# Patient Record
Sex: Male | Born: 1950 | Race: Black or African American | Hispanic: No | Marital: Single | Smoking: Never smoker
Health system: Southern US, Community
[De-identification: ages and names within clinical notes are randomized; demographics above are authoritative.]

## PROBLEM LIST (undated history)

## (undated) DIAGNOSIS — N289 Disorder of kidney and ureter, unspecified: Secondary | ICD-10-CM

## (undated) DIAGNOSIS — I471 Supraventricular tachycardia, unspecified: Secondary | ICD-10-CM

## (undated) DIAGNOSIS — I10 Essential (primary) hypertension: Secondary | ICD-10-CM

## (undated) DIAGNOSIS — I5020 Unspecified systolic (congestive) heart failure: Secondary | ICD-10-CM

## (undated) DIAGNOSIS — I119 Hypertensive heart disease without heart failure: Secondary | ICD-10-CM

## (undated) DIAGNOSIS — I43 Cardiomyopathy in diseases classified elsewhere: Secondary | ICD-10-CM

## (undated) DIAGNOSIS — N182 Chronic kidney disease, stage 2 (mild): Secondary | ICD-10-CM

## (undated) DIAGNOSIS — I428 Other cardiomyopathies: Secondary | ICD-10-CM

## (undated) DIAGNOSIS — I509 Heart failure, unspecified: Secondary | ICD-10-CM

## (undated) DIAGNOSIS — I499 Cardiac arrhythmia, unspecified: Secondary | ICD-10-CM

## (undated) HISTORY — DX: Other cardiomyopathies: I42.8

## (undated) HISTORY — DX: Cardiomyopathy in diseases classified elsewhere: I43

## (undated) HISTORY — DX: Essential (primary) hypertension: I10

## (undated) HISTORY — DX: Disorder of kidney and ureter, unspecified: N28.9

## (undated) HISTORY — DX: Chronic kidney disease, stage 2 (mild): N18.2

## (undated) HISTORY — DX: Supraventricular tachycardia: I47.1

## (undated) HISTORY — DX: Supraventricular tachycardia, unspecified: I47.10

## (undated) HISTORY — DX: Unspecified systolic (congestive) heart failure: I50.20

## (undated) HISTORY — DX: Heart failure, unspecified: I50.9

## (undated) HISTORY — DX: Cardiac arrhythmia, unspecified: I49.9

## (undated) HISTORY — DX: Hypertensive heart disease without heart failure: I11.9

---

## 2006-12-02 ENCOUNTER — Other Ambulatory Visit: Payer: Self-pay

## 2006-12-02 ENCOUNTER — Emergency Department: Payer: Self-pay | Admitting: Emergency Medicine

## 2009-07-11 ENCOUNTER — Ambulatory Visit: Payer: Self-pay | Admitting: Cardiovascular Disease

## 2009-07-11 ENCOUNTER — Inpatient Hospital Stay: Payer: Self-pay | Admitting: Internal Medicine

## 2009-07-13 ENCOUNTER — Encounter: Payer: Self-pay | Admitting: Cardiovascular Disease

## 2009-07-14 ENCOUNTER — Encounter: Payer: Self-pay | Admitting: Cardiovascular Disease

## 2009-07-14 ENCOUNTER — Telehealth: Payer: Self-pay | Admitting: Cardiovascular Disease

## 2009-07-15 ENCOUNTER — Encounter: Payer: Self-pay | Admitting: Cardiovascular Disease

## 2009-07-19 ENCOUNTER — Encounter: Payer: Self-pay | Admitting: Cardiovascular Disease

## 2009-07-19 ENCOUNTER — Telehealth: Payer: Self-pay | Admitting: Cardiovascular Disease

## 2009-07-22 ENCOUNTER — Ambulatory Visit: Payer: Self-pay | Admitting: Cardiovascular Disease

## 2009-07-22 DIAGNOSIS — I471 Supraventricular tachycardia: Secondary | ICD-10-CM | POA: Insufficient documentation

## 2009-07-22 DIAGNOSIS — I420 Dilated cardiomyopathy: Secondary | ICD-10-CM

## 2009-07-22 DIAGNOSIS — I21A1 Myocardial infarction type 2: Secondary | ICD-10-CM | POA: Insufficient documentation

## 2009-07-22 DIAGNOSIS — R0602 Shortness of breath: Secondary | ICD-10-CM

## 2009-07-25 ENCOUNTER — Encounter: Payer: Self-pay | Admitting: Cardiovascular Disease

## 2009-07-26 ENCOUNTER — Encounter: Payer: Self-pay | Admitting: Cardiovascular Disease

## 2009-08-10 ENCOUNTER — Telehealth: Payer: Self-pay | Admitting: Cardiovascular Disease

## 2009-08-24 ENCOUNTER — Ambulatory Visit: Payer: Self-pay | Admitting: Family

## 2009-10-03 ENCOUNTER — Encounter: Payer: Self-pay | Admitting: Cardiovascular Disease

## 2009-10-06 ENCOUNTER — Ambulatory Visit: Payer: Self-pay | Admitting: Family

## 2010-01-27 ENCOUNTER — Ambulatory Visit: Payer: Self-pay | Admitting: Family

## 2010-04-07 ENCOUNTER — Ambulatory Visit: Payer: Self-pay | Admitting: Family

## 2010-05-19 ENCOUNTER — Ambulatory Visit: Payer: Self-pay | Admitting: Family

## 2010-05-30 NOTE — Letter (Signed)
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 [address]   [place], [place] [address]   Phone: [phone]  Fax: [phone]    07/19/2009  TO: Leodis Sias IT MAY CONCERN   RE: Steve Salazar [address] [place] [address]   The above named individual is under my medical care and may return to work on: 07/22/09  If you have any further questions or need additional information, please call.     Sincerely,     Dossie Arbour, MD

## 2010-05-30 NOTE — Miscellaneous (Signed)
Summary: increased dil to 180 mg  Clinical Lists Changes  Medications: Changed medication from CARDIZEM 120 MG TABS (DILTIAZEM HCL) 1 by mouth once daily to DILTIAZEM HCL ER BEADS 180 MG XR24H-CAP (DILTIAZEM HCL ER BEADS) 1 tab by mouth daily - Signed Rx of DILTIAZEM HCL ER BEADS 180 MG XR24H-CAP (DILTIAZEM HCL ER BEADS) 1 tab by mouth daily;  #30 x 1;  Signed;  Entered by: Charlena Cross, RN, BSN;  Authorized by: Dossie Arbour MD;  Method used: Electronically to [facility] [address] [phone].*, [address] [place], [place], [place]  [address], Ph: [phone], Fax: [phone]    Prescriptions: DILTIAZEM HCL ER BEADS 180 MG XR24H-CAP (DILTIAZEM HCL ER BEADS) 1 tab by mouth daily  #30 x 1   Entered by:   Charlena Cross, RN, BSN   Authorized by:   Dossie Arbour MD   Signed by:   Charlena Cross, RN, BSN on 07/26/2009   Method used:   Electronically to        [facility] [address] [phone].* (retail)       [address]       [place], [place]  [address]       Ph: [phone]       Fax: [phone]   RxID:   [id number]

## 2010-05-30 NOTE — Progress Notes (Signed)
Summary: appt rescheduled  Phone Note Outgoing Call   Summary of Call: called pt to see how he is feeling- if he is feeling ok, Dr. Mariah Milling would like to move his ROV to May when he returns.  If pt is SOB, pt to see Dr. Shirlee Latch on Friday.  Pt would like to postpone appt until Dr. Mariah Milling returns.  Pt would also like rx for cialis if possible.  Initial call taken by: Charlena Cross, RN, BSN,  August 10, 2009 10:49 AM     Appended Document: appt rescheduled per Dr. Mariah Milling cialis 20 mg 1/2 tab daily to start. Charlena Cross RN BSN   Clinical Lists Changes  Medications: Added new medication of CIALIS 20 MG TABS (TADALAFIL) 1/2 tab by mouth 30 minutes prior to activity.  May increase to 1 tab by mouth as needed. - Signed Rx of CIALIS 20 MG TABS (TADALAFIL) 1/2 tab by mouth 30 minutes prior to activity.  May increase to 1 tab by mouth as needed.;  #30 x 1;  Signed;  Entered by: Charlena Cross, RN, BSN;  Authorized by: Dossie Arbour MD;  Method used: Electronically to [facility] [address] [phone].*, [address], [place], [place], [place]  [address], Ph: [phone], Fax: [phone]    Prescriptions: CIALIS 20 MG TABS (TADALAFIL) 1/2 tab by mouth 30 minutes prior to activity.  May increase to 1 tab by mouth as needed.  #30 x 1   Entered by:   Charlena Cross, RN, BSN   Authorized by:   Dossie Arbour MD   Signed by:   Charlena Cross, RN, BSN on 08/10/2009   Method used:   Electronically to        [facility] [address] [phone].* (retail)       [address]       [place], [place]  [address]       Ph: [phone]       Fax: [phone]   RxID:   [id number]

## 2010-05-30 NOTE — Procedures (Signed)
Summary: [facility]  [facility]   Imported By: Harlon Flor 07/26/2009 08:42:46  _____________________________________________________________________  External Attachment:    Type:   Image     Comment:   External Document  Appended Document: [facility] pt started on dilt 180. Charlena Cross RN BSN   Appended Document: [facility] Patient has no symptoms when in SVT. Freuqent episodes. Presented with CHF at [facility]. SVT has not responded to diltiazem and he may require low dose amio? We tried to adjust medications while he was still on his event monitor and he gave the phone to his girlfriend because he did not understand the medication change. She was rude to [facility] and told her that "he was on enough medication already and to leave it alone."

## 2010-05-30 NOTE — Progress Notes (Signed)
Summary: PAPERS  Phone Note Call from Patient Call back at Home Phone [phone]   Caller: SELF Call For: Samella Lucchetti Summary of Call: WOULD LIKE A CALL BACK FROM DR Mariah Milling ABOUT PICKING SOME PAPERS UP THAT NEED TO BE SIGNED BY Bonny Egger SO THAT HE CAN RETURN TO WORK Initial call taken by: Harlon Flor,  July 19, 2009 12:29 PM  Follow-up for Phone Call        pt aware that papers are ready. Follow-up by: Charlena Cross, RN, BSN,  July 19, 2009 4:48 PM

## 2010-05-30 NOTE — Procedures (Signed)
Summary: [facility] Event  [facility] Event   Imported By: West Carbo 10/03/2009 11:51:43  _____________________________________________________________________  External Attachment:    Type:   Image     Comment:   External Document

## 2010-05-30 NOTE — Letter (Signed)
[facility]   Imported By: Harlon Flor 07/15/2009 12:21:30  _____________________________________________________________________  External Attachment:    Type:   Image     Comment:   External Document

## 2010-05-30 NOTE — Letter (Signed)
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 [address]   [place], [place] [address]   Phone: [phone]  Fax: [phone]    07/15/2009  TO: Leodis Sias IT MAY CONCERN   RE: Steve Salazar [address] [id number]   The above named individual is under my medical care and may return to work on: 07/18/09  If you have any further questions or need additional information, please call.     Sincerely,      Dossie Arbour MD

## 2010-05-30 NOTE — Progress Notes (Signed)
Summary: PHI  PHI   Imported By: Harlon Flor 07/25/2009 16:22:00  _____________________________________________________________________  External Attachment:    Type:   Image     Comment:   External Document

## 2010-05-30 NOTE — Assessment & Plan Note (Signed)
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  History of Present Illness: [name] is a pleasant 60 year old gentleman with recent admission to [facility] on 3/14 and discharged on 3/17 with shortness of breath, systolic congestive heart failure with ejection fraction 25%, nonischemic cardiomyopathy with negative stress test who presents for followup.  During his hospital course, he had significant diuresis, he was started on beta blockers, diltiazem, digoxin for rhythm control. He states that he feels well, has no significant shortness of breath and is able to do things that he could not do previously. He was unable to appreciate his fast rhythm and since the discharge, again has had no palpitations. An event monitor was ordered and he currently has it and will start wearing it today. This will be used to rule out further arrhythmia.  Stress test showed severe cardiomyopathy with ejection fraction 25%, dilated left ventricle, low risk scan.   Initial BNP on arrival to the hospital was 1342  He denied any history of drug or alcohol use, no cocaine.  Total cholesterol 167, LDL 74, HDL 77.  Echo showing moderately dilated left ventricle, ejection fraction 25 to 30%, diastolic relaxation abnormality, normal RV size and function, mild to moderate mitral regurgitation, mild to moderately dilated left atrium  Preventive Screening-Counseling & Management  Alcohol-Tobacco     Alcohol drinks/day: 0     Smoking Status: never  Caffeine-Diet-Exercise     Caffeine use/day: 1/2 - 1 cup     Does Patient Exercise: yes      Drug Use:  no.    Current Medications (verified): 1)  Digoxin 0.25 Mg Tabs (Digoxin) .... Take One Tablet By Mouth Daily 2)  Furosemide 20 Mg Tabs (Furosemide) .... Take One Tablet By Mouth Daily. 3)  Cardizem 120 Mg Tabs (Diltiazem Hcl) .Marland Kitchen.. 1 By  Mouth Once Daily 4)  Enalapril Maleate 2.5 Mg Tabs (Enalapril Maleate) .Marland Kitchen.. 1 By Mouth Once Daily 5)  Carvedilol 3.125 Mg Tabs (Carvedilol) .... Take One Tablet By Mouth Twice A Day 6)  Aspirin Ec 325 Mg Tbec (Aspirin) .... Take One Tablet By Mouth Daily 7)  Fish Oil 1000 Mg Caps (Omega-3 Fatty Acids) .Marland Kitchen.. 1 By Mouth Once Daily  Allergies (verified): No Known Drug Allergies  Past History:  Past Medical History: CHF Cardiomyopathy with EF 35% (HYPERTENSION CARDIOMYOPATHY) Arrhythmia with SVT versus A-fib Hypertension Mild chronic renal insufficieny  Past Surgical History: Unremarkable  Family History: Father: deceased 58: MI Mother: deceased: car accident Sibilings: pretty healthy  Social History: Full Time Single  Tobacco Use - No.  Alcohol Use - no Regular Exercise - yes Drug Use - no Alcohol drinks/day:  0 Smoking Status:  never Caffeine use/day:  1/2 - 1 cup Does Patient Exercise:  yes Drug Use:  no  Review of Systems  The patient denies fever, weight loss, weight gain, vision loss, decreased hearing, hoarseness, chest pain, syncope, dyspnea on exertion, peripheral edema, prolonged cough, abdominal pain, incontinence, muscle weakness, depression, and enlarged lymph nodes.    Vital Signs:  Patient profile:   60 year old male Height:      71 inches Weight:      217.50 pounds BMI:     30.44 Pulse rate:   69 / minute Pulse rhythm:   regular BP sitting:   148 / 99  (left arm) Cuff size:  regular  Vitals Entered By: Mercer Pod (July 22, 2009 10:43 AM)  Physical Exam  General:  well-appearing gentleman in no apparent distress, HEENT exam is benign, neck is supple with no JVP or carotid bruits, heart sounds are regular with S1-S2 and no murmurs appreciated, lungs are clear to auscultation with no wheezes Rales, abdominal exam is benign, no significant lower extremity edema, neurologic exam is nonfocal, skin is dry. Pulses are equal and symmetrical in his  upper and lower extremities.    EKG  Procedure date:  07/11/2009  Findings:      normal sinus rhythm with rate 83 beats per minute, rare PVC, diffuse T wave changes noted in V4 through V6, one and aVL.  Impression & Recommendations:  Problem # 1:  SVT/ PSVT/ PAT (ICD-427.0) SVT was noted in the hospital. It is uncertain if his frequent episodes was causing his underlying cardiomyopathy orifice was do to some other etiology. Stress test was negative and he denies any drugs of abuse.  He has an event monitor that will be started today. We can adjust his medications as needed to control his rhythm. If we have difficulty with his SVT, we will refer him to electrophysiology.  His updated medication list for this problem includes:    Cardizem 120 Mg Tabs (Diltiazem hcl) .Marland Kitchen... 1 by mouth once daily    Enalapril Maleate 2.5 Mg Tabs (Enalapril maleate) .Marland Kitchen... 1 by mouth once daily    Carvedilol 3.125 Mg Tabs (Carvedilol) .Marland Kitchen... Take one tablet by mouth twice a day    Aspirin Ec 325 Mg Tbec (Aspirin) .Marland Kitchen... Take one tablet by mouth daily  Problem # 2:  DYSPNEA (ICD-786.05) No significant shortness of breath on today's visit. He seems to be doing well on his current medication regiment and we have made no medication changes. His blood pressure is borderline elevated today though he states it is well controlled at home with the systolic in the 120 to 130 range.   His updated medication list for this problem includes:    Digoxin 0.25 Mg Tabs (Digoxin) .Marland Kitchen... Take one tablet by mouth daily    Furosemide 20 Mg Tabs (Furosemide) .Marland Kitchen... Take one tablet by mouth daily.    Cardizem 120 Mg Tabs (Diltiazem hcl) .Marland Kitchen... 1 by mouth once daily    Enalapril Maleate 2.5 Mg Tabs (Enalapril maleate) .Marland Kitchen... 1 by mouth once daily    Carvedilol 3.125 Mg Tabs (Carvedilol) .Marland Kitchen... Take one tablet by mouth twice a day    Aspirin Ec 325 Mg Tbec (Aspirin) .Marland Kitchen... Take one tablet by mouth daily  Problem # 3:  CARDIOMYOPATHY  (ICD-425.4) Nonischemic cardiomyopathy with recent history of systolic CHF. We'll continue to advance his medical management and recheck A. echocardiogram on followup in 6 weeks to 2 months time. If his systolic function stays poor, less than 35%, we will refer him to EP 2 discuss whether he might need an ICD for prevention.  His updated medication list for this problem includes:    Digoxin 0.25 Mg Tabs (Digoxin) .Marland Kitchen... Take one tablet by mouth daily    Furosemide 20 Mg Tabs (Furosemide) .Marland Kitchen... Take one tablet by mouth daily.    Cardizem 120 Mg Tabs (Diltiazem hcl) .Marland Kitchen... 1 by mouth once daily    Enalapril Maleate 2.5 Mg Tabs (Enalapril maleate) .Marland Kitchen... 1 by mouth once daily    Carvedilol 3.125 Mg Tabs (Carvedilol) .Marland Kitchen... 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  Will try to increase diltiazem to 180 mg daily. If continued SVT, would consider consider starting amiodarone

## 2010-05-30 NOTE — Progress Notes (Signed)
Summary: Questions  Phone Note Call from Patient Call back at Home Phone [phone]   Caller: Other Relative Call For: Dr.Kyandra Mcclaine Summary of Call: Patient was discharged from the hospital today and he talked to his sister who lives in [place].  Patient told his sister to call here and discuss his care with Dr. Mariah Milling.  His sister Clearance Coots called this afternoon and would like to discuss his care with Dr. Mariah Milling her number is [phone] Initial call taken by: West Carbo,  July 14, 2009 5:02 PM  Follow-up for Phone Call        spoke with pt today to confirm appts with Mariah Milling and Graciela Husbands.  Instructed pt that order for event monitor had been placed and that he would be rec'ing it in the mail.  pt expressed understanding.  addressed sisters phone call with pt- pt states that sisters questions had been answered so I will not follow through with calling her at this point.  Follow-up by: Charlena Cross, RN, BSN,  July 15, 2009 9:09 AM

## 2010-05-30 NOTE — Letter (Signed)
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 [address]   [place], [place] [address]   Phone: [phone]  Fax: [phone]    07/22/2009  TO: Leodis Sias IT MAY CONCERN   RE: Steve Salazar [address] [place]   The above named individual is under my medical care and may return to work on 07/25/09.  Please excuse him from work from 07/11/09 to 07/24/09.  If you have any further questions or need additional information, please call [phone].     Sincerely,      Dossie Arbour, MD

## 2010-08-11 ENCOUNTER — Ambulatory Visit: Payer: Self-pay | Admitting: Family

## 2010-11-14 ENCOUNTER — Encounter: Payer: Self-pay | Admitting: Cardiovascular Disease

## 2010-11-17 ENCOUNTER — Ambulatory Visit: Payer: Self-pay | Admitting: Family

## 2010-11-23 ENCOUNTER — Encounter: Payer: Self-pay | Admitting: Cardiovascular Disease

## 2010-11-23 ENCOUNTER — Ambulatory Visit (INDEPENDENT_AMBULATORY_CARE_PROVIDER_SITE_OTHER): Payer: Managed Care, Other (non HMO) | Admitting: Cardiovascular Disease

## 2010-11-23 DIAGNOSIS — I428 Other cardiomyopathies: Secondary | ICD-10-CM

## 2010-11-23 DIAGNOSIS — R0602 Shortness of breath: Secondary | ICD-10-CM

## 2010-11-23 DIAGNOSIS — I471 Supraventricular tachycardia: Secondary | ICD-10-CM

## 2010-11-23 NOTE — Progress Notes (Signed)
Patient ID: Steve Salazar, male    DOB: 01/13/1951, 60 y.o.   MRN: [id number]  HPI Comments: Steve Salazar is a pleasant 60 year old gentleman with recent admission to [facility] on 3/14 and discharged on 3/17 with shortness of breath, systolic congestive heart failure with ejection fraction 25%, nonischemic cardiomyopathy with negative stress test who presents for followup.   Stress test showed severe cardiomyopathy with ejection fraction 25%, dilated left ventricle, low risk scan. Initial BNP on arrival to the hospital was 1342 He denied any history of drug or alcohol use, no cocaine. Total cholesterol 167, LDL 74, HDL 77.   Echo showing moderately dilated left ventricle, ejection fraction 25 to 30%, diastolic relaxation abnormality, normal RV size and function, mild to moderate mitral regurgitation, mild to moderately dilated left atrium  He has been very well. He is off his Lasix as his weight has been stable. He works in a semi-planned with a high temperature, plays horseshoes in the heat and does not drink much water. His weight has been stable in the 213 range, improved from his weight last year at 217. He otherwise feels well with no significant shortness of breath, no chest pain, no lightheadedness or dizziness.  EKG shows normal sinus rhythm with rate 64 beats per minute with T wave abnormality in V3 through V6, II and AVL.   Outpatient Encounter Prescriptions as of 11/23/2010  Medication Sig Dispense Refill  . aspirin EC 325 MG tablet Take 325 mg by mouth daily.        . carvedilol (COREG) 6.25 MG tablet Take 6.25 mg by mouth 2 (two) times daily with a meal.        . digoxin (LANOXIN) 0.25 MG tablet Take 250 mcg by mouth daily.        . enalapril (VASOTEC) 2.5 MG tablet Take 2.5 mg by mouth daily.        . furosemide (LASIX) 20 MG tablet Take 20 mg by mouth daily as needed.       . Omega-3 Fatty Acids (FISH OIL) 1000 MG CAPS Take 1,000 mg by mouth daily.         . tadalafil (CIALIS) 20 MG tablet Take 10 mg by mouth. Take 30 minutes prior to activity. May increase to 1 tablet (20mg ) as needed.          Review of Systems  Constitutional: Positive for fatigue.  HENT: Negative.   Eyes: Negative.   Respiratory: Negative.   Cardiovascular: Negative.   Gastrointestinal: Negative.   Musculoskeletal: Negative.   Skin: Negative.   Neurological: Negative.   Hematological: Negative.   Psychiatric/Behavioral: Negative.   All other systems reviewed and are negative.    BP 115/80  Pulse 74  Ht 5\' 11"  (1.803 m)  Wt 213 lb (96.616 kg)  BMI 29.71 kg/m2  Physical Exam  Nursing note and vitals reviewed. Constitutional: He is oriented to person, place, and time. He appears well-developed and well-nourished.  HENT:  Head: Normocephalic.  Nose: Nose normal.  Mouth/Throat: Oropharynx is clear and moist.  Eyes: Conjunctivae are normal. Pupils are equal, round, and reactive to light.  Neck: Normal range of motion. Neck supple. No JVD present.  Cardiovascular: Normal rate, regular rhythm, S1 normal, S2 normal, normal heart sounds and intact distal pulses.  Exam reveals no gallop and no friction rub.   No murmur heard. Pulmonary/Chest: Effort normal and breath sounds normal. No respiratory distress. He has no wheezes. He has no rales. He  exhibits no tenderness.  Abdominal: Soft. Bowel sounds are normal. He exhibits no distension. There is no tenderness.  Musculoskeletal: Normal range of motion. He exhibits no edema and no tenderness.  Lymphadenopathy:    He has no cervical adenopathy.  Neurological: He is alert and oriented to person, place, and time. Coordination normal.  Skin: Skin is warm and dry. No rash noted. No erythema.  Psychiatric: He has a normal mood and affect. His behavior is normal. Judgment and thought content normal.           Assessment and Plan

## 2010-11-23 NOTE — Patient Instructions (Addendum)
You are doing well. No medication changes were made. Please call us if you have new issues that need to be addressed before your next appt.  We will call you for a follow up Appt. In 12 months  

## 2010-11-23 NOTE — Assessment & Plan Note (Signed)
No significant shortness of breath on today's visit. Appears well compensated. No edema.

## 2010-11-23 NOTE — Assessment & Plan Note (Signed)
Appears to be stable on his current medication regimen. Avastin to monitor his weight and restart Lasix if his weight increases 3-5 pounds. Continue him on his current Coreg and ACE inhibitor dosing as he does report occasional dizziness.

## 2011-06-14 ENCOUNTER — Telehealth: Payer: Self-pay

## 2011-06-14 MED ORDER — DIGOXIN 250 MCG PO TABS: 250.0000 ug | ORAL_TABLET | Freq: Every day | ORAL | Status: DC

## 2011-06-14 MED ORDER — ENALAPRIL MALEATE 2.5 MG PO TABS: 2.5000 mg | ORAL_TABLET | Freq: Every day | ORAL | Status: DC

## 2011-06-14 NOTE — Telephone Encounter (Signed)
Refill sent for digoxin 250 mcg daily.

## 2011-06-14 NOTE — Telephone Encounter (Signed)
Refill sent for enalapril 2.5 mg

## 2011-07-12 ENCOUNTER — Telehealth: Payer: Self-pay

## 2011-07-12 MED ORDER — CARVEDILOL 6.25 MG PO TABS: 6.2500 mg | ORAL_TABLET | Freq: Two times a day (BID) | ORAL | Status: DC

## 2011-07-12 NOTE — Telephone Encounter (Signed)
Refill sent for carvedilol 6.25 mg take one tablet bid.

## 2011-11-12 ENCOUNTER — Other Ambulatory Visit: Payer: Self-pay | Admitting: Cardiovascular Disease

## 2011-11-13 ENCOUNTER — Other Ambulatory Visit: Payer: Self-pay | Admitting: *Deleted

## 2011-11-13 MED ORDER — CARVEDILOL 6.25 MG PO TABS: 6.2500 mg | ORAL_TABLET | Freq: Two times a day (BID) | ORAL | Status: DC

## 2011-11-13 NOTE — Telephone Encounter (Signed)
Refilled Carvedilol. 

## 2011-12-07 ENCOUNTER — Ambulatory Visit (INDEPENDENT_AMBULATORY_CARE_PROVIDER_SITE_OTHER): Payer: Managed Care, Other (non HMO) | Admitting: Cardiovascular Disease

## 2011-12-07 ENCOUNTER — Encounter: Payer: Self-pay | Admitting: Cardiovascular Disease

## 2011-12-07 VITALS — BP 145/93 | HR 61 | Ht 71.0 in | Wt 212.5 lb

## 2011-12-07 DIAGNOSIS — R001 Bradycardia, unspecified: Secondary | ICD-10-CM

## 2011-12-07 DIAGNOSIS — I498 Other specified cardiac arrhythmias: Secondary | ICD-10-CM

## 2011-12-07 DIAGNOSIS — I4891 Unspecified atrial fibrillation: Secondary | ICD-10-CM

## 2011-12-07 DIAGNOSIS — I428 Other cardiomyopathies: Secondary | ICD-10-CM

## 2011-12-07 NOTE — Progress Notes (Signed)
Patient ID: Steve Salazar, male    DOB: [birth year], 61 y.o.   MRN: [id number]  HPI Comments: Steve Salazar is a pleasant 61 year old gentleman with  admission to [facility] on 07/12/10 and and discharged on 3/17 with shortness of breath, systolic congestive heart failure with ejection fraction 25%, nonischemic cardiomyopathy with negative stress test who presents for followup.   Stress test showed severe cardiomyopathy with ejection fraction 25%, dilated left ventricle, low risk scan. Initial BNP on arrival to the hospital was 1342 He denied any history of drug or alcohol use, no cocaine. Total cholesterol 167, LDL 74, HDL 77.   Echo showing moderately dilated left ventricle, ejection fraction 25 to 30%, diastolic relaxation abnormality, normal RV size and function, mild to moderate mitral regurgitation, mild to moderately dilated left atrium  He has been very well. He is off his Lasix as his weight has been stable. His weight has been stable. He otherwise feels well with no significant shortness of breath, no chest pain, no lightheadedness or dizziness.  EKG shows normal sinus rhythm with rate 50 beats per minute with T wave abnormality in V3 through V6, II and AVL.   Outpatient Encounter Prescriptions as of 12/07/2011  Medication Sig Dispense Refill  . aspirin EC 325 MG tablet Take 325 mg by mouth daily.        . carvedilol (COREG) 6.25 MG tablet Take 1 tablet (6.25 mg total) by mouth 2 (two) times daily with a meal.  60 tablet  3  . digoxin (LANOXIN) 0.25 MG tablet Take 1 tablet (250 mcg total) by mouth daily.  30 tablet  6  . enalapril (VASOTEC) 2.5 MG tablet Take 1 tablet (2.5 mg total) by mouth daily.  30 tablet  6  . furosemide (LASIX) 20 MG tablet Take 20 mg by mouth daily as needed.       . Omega-3 Fatty Acids (FISH OIL) 1000 MG CAPS Take 1,000 mg by mouth daily.        . tadalafil (CIALIS) 20 MG tablet Take 10 mg by mouth. Take 30 minutes prior to activity.  May increase to 1 tablet (20mg ) as needed.          Review of Systems  HENT: Negative.   Eyes: Negative.   Respiratory: Negative.   Cardiovascular: Negative.   Gastrointestinal: Negative.   Musculoskeletal: Negative.   Skin: Negative.   Neurological: Negative.   Hematological: Negative.   Psychiatric/Behavioral: Negative.   All other systems reviewed and are negative.    BP 145/93  Pulse 61  Ht 5\' 11"  (1.803 m)  Wt 212 lb 8 oz (96.389 kg)  BMI 29.64 kg/m2  Physical Exam  Nursing note and vitals reviewed. Constitutional: He is oriented to person, place, and time. He appears well-developed and well-nourished.  HENT:  Head: Normocephalic.  Nose: Nose normal.  Mouth/Throat: Oropharynx is clear and moist.  Eyes: Conjunctivae are normal. Pupils are equal, round, and reactive to light.  Neck: Normal range of motion. Neck supple. No JVD present.  Cardiovascular: Normal rate, regular rhythm, S1 normal, S2 normal, normal heart sounds and intact distal pulses.  Exam reveals no gallop and no friction rub.   No murmur heard. Pulmonary/Chest: Effort normal and breath sounds normal. No respiratory distress. He has no wheezes. He has no rales. He exhibits no tenderness.  Abdominal: Soft. Bowel sounds are normal. He exhibits no distension. There is no tenderness.  Musculoskeletal: Normal range of motion. He exhibits no edema  and no tenderness.  Lymphadenopathy:    He has no cervical adenopathy.  Neurological: He is alert and oriented to person, place, and time. Coordination normal.  Skin: Skin is warm and dry. No rash noted. No erythema.  Psychiatric: He has a normal mood and affect. His behavior is normal. Judgment and thought content normal.           Assessment and Plan

## 2011-12-07 NOTE — Assessment & Plan Note (Signed)
Suspected nonischemic cardiomyopathy. He is doing well. No recent echocardiogram. We did suggest echocardiogram at some point to reevaluate his ejection fraction. This would likely not change medical management.

## 2011-12-07 NOTE — Assessment & Plan Note (Signed)
Possibly exacerbated by being on his carvedilol. We have suggested if he has any lightheadedness or dizziness, but he cut his carvedilol in 1/2, taking 3.125 mg twice a day.

## 2011-12-07 NOTE — Patient Instructions (Addendum)
You are doing well. No medication changes were made.  Please call us if you have new issues that need to be addressed before your next appt.  Your physician wants you to follow-up in: 12 months.  You will receive a reminder letter in the mail two months in advance. If you don't receive a letter, please call our office to schedule the follow-up appointment. 

## 2012-01-11 ENCOUNTER — Other Ambulatory Visit: Payer: Self-pay | Admitting: Cardiovascular Disease

## 2012-01-11 NOTE — Telephone Encounter (Signed)
Refilled Digoxin and Enalapril.

## 2012-03-09 ENCOUNTER — Other Ambulatory Visit: Payer: Self-pay | Admitting: Cardiovascular Disease

## 2012-03-10 ENCOUNTER — Other Ambulatory Visit: Payer: Self-pay | Admitting: *Deleted

## 2012-03-10 MED ORDER — CARVEDILOL 6.25 MG PO TABS: 6.2500 mg | ORAL_TABLET | Freq: Two times a day (BID) | ORAL | Status: DC

## 2012-03-10 NOTE — Telephone Encounter (Signed)
Refilled Carvedilol. 

## 2012-07-30 ENCOUNTER — Other Ambulatory Visit: Payer: Self-pay | Admitting: Cardiovascular Disease

## 2012-07-31 ENCOUNTER — Other Ambulatory Visit: Payer: Self-pay | Admitting: *Deleted

## 2012-07-31 MED ORDER — CARVEDILOL 6.25 MG PO TABS: 6.2500 mg | ORAL_TABLET | Freq: Two times a day (BID) | ORAL | Status: DC

## 2012-07-31 NOTE — Telephone Encounter (Signed)
Refilled Carvedilol sent to [facility].

## 2012-08-10 ENCOUNTER — Other Ambulatory Visit: Payer: Self-pay | Admitting: Cardiovascular Disease

## 2013-01-26 ENCOUNTER — Ambulatory Visit (INDEPENDENT_AMBULATORY_CARE_PROVIDER_SITE_OTHER): Payer: Managed Care, Other (non HMO) | Admitting: Cardiovascular Disease

## 2013-01-26 ENCOUNTER — Encounter: Payer: Self-pay | Admitting: Cardiovascular Disease

## 2013-01-26 VITALS — BP 152/98 | HR 59 | Ht 71.0 in | Wt 221.5 lb

## 2013-01-26 DIAGNOSIS — I428 Other cardiomyopathies: Secondary | ICD-10-CM

## 2013-01-26 DIAGNOSIS — I10 Essential (primary) hypertension: Secondary | ICD-10-CM

## 2013-01-26 DIAGNOSIS — I471 Supraventricular tachycardia, unspecified: Secondary | ICD-10-CM

## 2013-01-26 DIAGNOSIS — E785 Hyperlipidemia, unspecified: Secondary | ICD-10-CM

## 2013-01-26 NOTE — Progress Notes (Signed)
Patient ID: NAVARRE DIANA, male    DOB: 1950/08/05, 62 y.o.   MRN: [id number]  HPI Comments: Mr. Luffman is a pleasant 62 year old gentleman with  admission to [facility] on 07/12/10 and and discharged on 3/17 with shortness of breath, systolic congestive heart failure with ejection fraction 25%, nonischemic cardiomyopathy with negative stress test who presents for followup.   Stress test showed severe cardiomyopathy with ejection fraction 25%, dilated left ventricle, low risk scan. Initial BNP on arrival to the hospital was 1342 He denied any history of drug or alcohol use, no cocaine. Total cholesterol 167, LDL 74, HDL 77.   Echo showing moderately dilated left ventricle, ejection fraction 25 to 30%, diastolic relaxation abnormality, normal RV size and function, mild to moderate mitral regurgitation, mild to moderately dilated left atrium  In followup today, he reports that he is doing well. He has only been taking Coreg once per day. He does not check his blood pressure at home. He denies any lower extremity edema or shortness of breath. Otherwise he is very active at baseline. Weight has been relatively stable  EKG shows normal sinus rhythm with rate 59 beats per minute with T wave abnormality in V4 through V6, I and AVL.   Outpatient Encounter Prescriptions as of 01/26/2013  Medication Sig Dispense Refill  . aspirin EC 325 MG tablet Take 325 mg by mouth daily.        . carvedilol (COREG) 6.25 MG tablet Take 1 tablet (6.25 mg total) by mouth 2 (two) times daily with a meal.  60 tablet  3  . DIGOX 0.25 MG tablet take 1 tablet by mouth once daily  30 tablet  6  . enalapril (VASOTEC) 2.5 MG tablet take 1 tablet by mouth once daily  30 tablet  6  . furosemide (LASIX) 20 MG tablet Take 20 mg by mouth daily as needed.       . Omega-3 Fatty Acids (FISH OIL) 1000 MG CAPS Take 1,000 mg by mouth daily.        . tadalafil (CIALIS) 20 MG tablet Take 10 mg by mouth. Take 30  minutes prior to activity. May increase to 1 tablet (20mg ) as needed.        No facility-administered encounter medications on file as of 01/26/2013.     Review of Systems  Constitutional: Negative.   HENT: Negative.   Eyes: Negative.   Respiratory: Negative.   Cardiovascular: Negative.   Gastrointestinal: Negative.   Endocrine: Negative.   Musculoskeletal: Negative.   Skin: Negative.   Allergic/Immunologic: Negative.   Neurological: Negative.   Hematological: Negative.   Psychiatric/Behavioral: Negative.   All other systems reviewed and are negative.    BP 152/98  Ht 5\' 11"  (1.803 m)  Wt 221 lb 8 oz (100.472 kg)  BMI 30.91 kg/m2  Physical Exam  Nursing note and vitals reviewed. Constitutional: He is oriented to person, place, and time. He appears well-developed and well-nourished.  HENT:  Head: Normocephalic.  Nose: Nose normal.  Mouth/Throat: Oropharynx is clear and moist.  Eyes: Conjunctivae are normal. Pupils are equal, round, and reactive to light.  Neck: Normal range of motion. Neck supple. No JVD present.  Cardiovascular: Normal rate, regular rhythm, S1 normal, S2 normal, normal heart sounds and intact distal pulses.  Exam reveals no gallop and no friction rub.   No murmur heard. Pulmonary/Chest: Effort normal and breath sounds normal. No respiratory distress. He has no wheezes. He has no rales. He exhibits  no tenderness.  Abdominal: Soft. Bowel sounds are normal. He exhibits no distension. There is no tenderness.  Musculoskeletal: Normal range of motion. He exhibits no edema and no tenderness.  Lymphadenopathy:    He has no cervical adenopathy.  Neurological: He is alert and oriented to person, place, and time. Coordination normal.  Skin: Skin is warm and dry. No rash noted. No erythema.  Psychiatric: He has a normal mood and affect. His behavior is normal. Judgment and thought content normal.      Assessment and Plan

## 2013-01-26 NOTE — Assessment & Plan Note (Signed)
No sign of congestive heart failure. Appears relatively euvolemic on today's visit. Encouraged him to take Lasix as needed for any shortness of breath, weight gain or edema. He has not needed Lasix as a general rule

## 2013-01-26 NOTE — Assessment & Plan Note (Signed)
Blood pressure elevated today. We have suggested he go back on Coreg 6.25 mg twice a day and continue to monitor his blood pressure. If numbers run high, we would increase his ACE inhibitor. If he has dizziness, we would cut the Coreg in half twice a day.

## 2013-01-26 NOTE — Patient Instructions (Addendum)
You are doing well. Blood pressure is high today. Please restart coreg twice a day  Monitor your blood pressure Goal pressure is 110-120/60-85  Call the office if it runs high  Please call us if you have new issues that need to be addressed before your next appt.  Your physician wants you to follow-up in: 6 months.  You will receive a reminder letter in the mail two months in advance. If you don't receive a letter, please call our office to schedule the follow-up appointment.

## 2013-01-27 LAB — LIPID PANEL
HDL: 63 mg/dL (ref >39)
LDL Calculated: 129 mg/dL — ABNORMAL HIGH (ref 0–99)
VLDL Cholesterol Cal: 16 mg/dL (ref 5–40)

## 2013-02-03 ENCOUNTER — Encounter: Payer: Self-pay | Admitting: Cardiovascular Disease

## 2013-02-13 ENCOUNTER — Telehealth: Payer: Self-pay

## 2013-02-13 ENCOUNTER — Other Ambulatory Visit: Payer: Self-pay | Admitting: Physician Assistant

## 2013-02-13 DIAGNOSIS — I420 Dilated cardiomyopathy: Secondary | ICD-10-CM

## 2013-02-13 DIAGNOSIS — E785 Hyperlipidemia, unspecified: Secondary | ICD-10-CM

## 2013-02-13 MED ORDER — DIGOXIN 125 MCG PO TABS: 0.1250 mg | ORAL_TABLET | Freq: Every day | ORAL | Status: DC

## 2013-02-13 MED ORDER — ATORVASTATIN CALCIUM 10 MG PO TABS: 10.0000 mg | ORAL_TABLET | Freq: Every day | ORAL | Status: DC

## 2013-02-13 NOTE — Telephone Encounter (Signed)
Message copied by Marilynne Halsted on Fri Feb 13, 2013  4:22 PM ------      Message from: Odella Aquas A      Created: Fri Feb 13, 2013  4:17 PM       Dig level a bit high at 1.2. On ACEi. Will reduce digoxin to 0.125mg  PO daily. LDL elevated at 129. 10 year risk for atherosclerotic event is 14% therefore warranting statin use. Will start atorvastatin 10mg  PO daily. Recheck dig level, LFTs and lipid panel in 6 weeks. ------

## 2013-02-13 NOTE — Telephone Encounter (Signed)
Voice mail box not set up yet. Try calling pt again on Monday.

## 2013-02-14 ENCOUNTER — Telehealth: Payer: Self-pay | Admitting: Physician Assistant

## 2013-02-14 NOTE — Telephone Encounter (Signed)
Received a call from the pharmacy regarding medications: The patient states he is unable to afford Lipitor as it will be $99. The dose equivalent Zocor (20 mg) is $9.99.  Changed the medication, same number of tablets and refills.

## 2013-02-16 NOTE — Telephone Encounter (Signed)
Spoke w/ pt.  He is aware of results and states that he has already picked up his new prescriptions. He will call to set up lab appt for first week in December.

## 2013-02-17 ENCOUNTER — Other Ambulatory Visit: Payer: Self-pay | Admitting: *Deleted

## 2013-02-17 ENCOUNTER — Other Ambulatory Visit: Payer: Self-pay | Admitting: Cardiovascular Disease

## 2013-02-17 MED ORDER — CARVEDILOL 6.25 MG PO TABS: 6.2500 mg | ORAL_TABLET | Freq: Two times a day (BID) | ORAL | Status: DC

## 2013-02-17 NOTE — Telephone Encounter (Signed)
Requested Prescriptions   Signed Prescriptions Disp Refills  . carvedilol (COREG) 6.25 MG tablet 60 tablet 3    Sig: Take 1 tablet (6.25 mg total) by mouth 2 (two) times daily with a meal.    Authorizing Provider: GOLLAN, TIMOTHY J    Ordering User: [employer] C    

## 2013-03-05 ENCOUNTER — Other Ambulatory Visit: Payer: Self-pay

## 2013-03-11 ENCOUNTER — Other Ambulatory Visit: Payer: Self-pay | Admitting: Cardiovascular Disease

## 2013-06-12 ENCOUNTER — Other Ambulatory Visit: Payer: Self-pay | Admitting: *Deleted

## 2013-06-12 MED ORDER — SIMVASTATIN 20 MG PO TABS: 20.0000 mg | ORAL_TABLET | Freq: Every day | ORAL | Status: DC

## 2013-06-12 NOTE — Telephone Encounter (Signed)
Requested Prescriptions   Signed Prescriptions Disp Refills  . simvastatin (ZOCOR) 20 MG tablet 30 tablet 3    Sig: Take 1 tablet (20 mg total) by mouth daily.    Authorizing Provider: [name]    Ordering User: Kendrick Fries

## 2013-06-22 ENCOUNTER — Other Ambulatory Visit: Payer: Self-pay | Admitting: *Deleted

## 2013-06-22 MED ORDER — DIGOXIN 125 MCG PO TABS: 0.1250 mg | ORAL_TABLET | Freq: Every day | ORAL | Status: DC

## 2013-06-22 NOTE — Telephone Encounter (Signed)
Requested Prescriptions   Signed Prescriptions Disp Refills  . digoxin (LANOXIN) 0.125 MG tablet 30 tablet 3    Sig: Take 1 tablet (0.125 mg total) by mouth daily.    Authorizing Provider: [name]    Ordering User: Kendrick Fries

## 2013-08-13 ENCOUNTER — Other Ambulatory Visit: Payer: Self-pay | Admitting: Cardiovascular Disease

## 2013-12-10 ENCOUNTER — Telehealth: Payer: Self-pay | Admitting: *Deleted

## 2013-12-10 NOTE — Telephone Encounter (Signed)
What is his weight and BP? Does he have lasix. If weight is up significant or edema, would start lasix 20 mg po BID Needs close follow up appt

## 2013-12-10 NOTE — Telephone Encounter (Signed)
Spoke with Aggie Cosier (pt fiance) mentioned that pt is due for appointment and needs Rx but is overdue for appointment. Pt is aware that 12/14/13 is available for Dr. Mariah Milling. Pt mentioned that pt has been having shortness of breath and  trouble breathing @ bedtime. Denies Chest pain Denies Dizziness Elevated BP: 152/111 HR: 72 today am. Pt wants to be seen sooner. Please advise.

## 2013-12-11 NOTE — Telephone Encounter (Signed)
Spoke w/ pt.  He reports that his BP is good today, though he does not report numbers.  Pt denies swelling, states that he does not weigh himself daily, as he is "not overweight". Discussed w/ pt the importance and purpose of daily wts.  He will monitor his BP and wt over the weekend and keep appt w/ Dr. Mariah Milling on Monday.

## 2013-12-14 ENCOUNTER — Ambulatory Visit: Payer: Managed Care, Other (non HMO) | Admitting: Cardiovascular Disease

## 2013-12-17 ENCOUNTER — Telehealth: Payer: Self-pay | Admitting: *Deleted

## 2013-12-17 ENCOUNTER — Ambulatory Visit (INDEPENDENT_AMBULATORY_CARE_PROVIDER_SITE_OTHER): Payer: Managed Care, Other (non HMO) | Admitting: Cardiovascular Disease

## 2013-12-17 ENCOUNTER — Encounter: Payer: Self-pay | Admitting: Cardiovascular Disease

## 2013-12-17 VITALS — BP 144/106 | HR 76 | Ht 71.0 in | Wt 224.5 lb

## 2013-12-17 DIAGNOSIS — I428 Other cardiomyopathies: Secondary | ICD-10-CM

## 2013-12-17 DIAGNOSIS — I471 Supraventricular tachycardia: Secondary | ICD-10-CM

## 2013-12-17 DIAGNOSIS — I10 Essential (primary) hypertension: Secondary | ICD-10-CM

## 2013-12-17 DIAGNOSIS — R0602 Shortness of breath: Secondary | ICD-10-CM

## 2013-12-17 MED ORDER — CARVEDILOL 6.25 MG PO TABS: 6.2500 mg | ORAL_TABLET | Freq: Two times a day (BID) | ORAL | Status: DC

## 2013-12-17 MED ORDER — FUROSEMIDE 20 MG PO TABS: 20.0000 mg | ORAL_TABLET | Freq: Two times a day (BID) | ORAL | Status: DC | PRN

## 2013-12-17 MED ORDER — SACUBITRIL-VALSARTAN 97-103 MG PO TABS: 97.0000 mg | ORAL_TABLET | Freq: Two times a day (BID) | ORAL | Status: DC

## 2013-12-17 NOTE — Progress Notes (Signed)
Patient ID: Steve Salazar, male    DOB: 17-May-1950, 63 y.o.   MRN: [id number]  HPI Comments: [name] is a pleasant 63 year old gentleman with  admission to [facility] on 07/12/10 and discharged on 3/17 with shortness of breath, systolic congestive heart failure with ejection fraction 25%, nonischemic cardiomyopathy with negative stress test who presents for followup.  In followup today, he reports that he is not taking any of his medications for the past 2 months. He has worsening shortness of breath, leg swelling, fatigue. Difficulty sleeping in a supine position. Also difficulty bending over to put on his shoes. Reports that he is unable to afford many of his medications.   Previous Stress test showed severe cardiomyopathy with ejection fraction 25%, dilated left ventricle, low risk scan. Initial BNP on arrival to the hospital was 1342 He denied any history of drug or alcohol use, no cocaine. Previous lab work showing Total cholesterol 167, LDL 74, HDL 77.   Echo showing moderately dilated left ventricle, ejection fraction 25 to 30%, diastolic relaxation abnormality, normal RV size and function, mild to moderate mitral regurgitation, mild to moderately dilated left atrium  EKG shows normal sinus rhythm with rate 76 beats per minute with T wave abnormality in V4 through V6, I and AVL.   Outpatient Encounter Prescriptions as of 12/17/2013  Medication Sig  . aspirin EC 325 MG tablet Take 325 mg by mouth daily.    . carvedilol (COREG) 6.25 MG tablet Take 1 tablet (6.25 mg total) by mouth 2 (two) times daily with a meal.  . digoxin (LANOXIN) 0.125 MG tablet Take 1 tablet (0.125 mg total) by mouth daily.  . enalapril (VASOTEC) 2.5 MG tablet take 1 tablet by mouth once daily  . furosemide (LASIX) 20 MG tablet Take 20 mg by mouth daily as needed.   . Omega-3 Fatty Acids (FISH OIL) 1000 MG CAPS Take 1,000 mg by mouth daily.    . simvastatin (ZOCOR) 20 MG tablet Take 1  tablet (20 mg total) by mouth daily.  . tadalafil (CIALIS) 20 MG tablet Take 10 mg by mouth. Take 30 minutes prior to activity. May increase to 1 tablet (20mg ) as needed.   He is not taking any of his medications   Review of Systems  Constitutional: Negative.   HENT: Negative.   Eyes: Negative.   Respiratory: Negative.   Cardiovascular: Negative.   Gastrointestinal: Negative.   Endocrine: Negative.   Musculoskeletal: Negative.   Skin: Negative.   Allergic/Immunologic: Negative.   Neurological: Negative.   Hematological: Negative.   Psychiatric/Behavioral: Negative.   All other systems reviewed and are negative.   BP 144/106  Pulse 76  Ht 5\' 11"  (1.803 m)  Wt 224 lb 8 oz (101.833 kg)  BMI 31.33 kg/m2  Physical Exam  Nursing note and vitals reviewed. Constitutional: He is oriented to person, place, and time. He appears well-developed and well-nourished.  HENT:  Head: Normocephalic.  Nose: Nose normal.  Mouth/Throat: Oropharynx is clear and moist.  Eyes: Conjunctivae are normal. Pupils are equal, round, and reactive to light.  Neck: Normal range of motion. Neck supple. No JVD present.  Cardiovascular: Normal rate, regular rhythm, S1 normal, S2 normal, normal heart sounds and intact distal pulses.  Exam reveals no gallop and no friction rub.   No murmur heard. Pulmonary/Chest: Effort normal and breath sounds normal. No respiratory distress. He has no wheezes. He has no rales. He exhibits no tenderness.  Abdominal: Soft. Bowel sounds are  normal. He exhibits no distension. There is no tenderness.  Musculoskeletal: Normal range of motion. He exhibits no edema and no tenderness.  Lymphadenopathy:    He has no cervical adenopathy.  Neurological: He is alert and oriented to person, place, and time. Coordination normal.  Skin: Skin is warm and dry. No rash noted. No erythema.  Psychiatric: He has a normal mood and affect. His behavior is normal. Judgment and thought content normal.       Assessment and Plan

## 2013-12-17 NOTE — Assessment & Plan Note (Signed)
Shortness of breath likely secondary to systolic CHF, poorly controlled blood pressure. We'll restart medications as above

## 2013-12-17 NOTE — Telephone Encounter (Signed)
Please call patient regarding his medication.

## 2013-12-17 NOTE — Assessment & Plan Note (Signed)
Blood pressures running high. We will make medication changes as above with close monitoring of his blood pressure

## 2013-12-17 NOTE — Telephone Encounter (Signed)
Spoke w/ pt.  Clarified meds w/ him.

## 2013-12-17 NOTE — Assessment & Plan Note (Signed)
Presumed nonischemic cardiomyopathy, not taking his medications secondary to financial reasons per the patient. We have recommended he restart Coreg 6.25 milligrams twice a day, restart Lasix 20 mg daily, extra Lasix for worsening leg swelling or shortness of breath. We will also start entresto 49/51 mg twice a day for several weeks with up titration to the higher dose after followup in clinic.

## 2013-12-17 NOTE — Patient Instructions (Addendum)
You are doing well.  Please restart coreg 6.25 mg po BID Please restart lasix one a day, extra laix as needed   PLEASE start entresto 1 pill twice a day Once the samples are gone fill the prescription a take twice a day  Please call us if you have new issues that need to be addressed before your next appt.  Your physician wants you to follow-up in: 3 to 4 weeks (less than one month)

## 2014-01-07 ENCOUNTER — Encounter: Payer: Self-pay | Admitting: Cardiovascular Disease

## 2014-01-07 ENCOUNTER — Ambulatory Visit (INDEPENDENT_AMBULATORY_CARE_PROVIDER_SITE_OTHER): Payer: Managed Care, Other (non HMO) | Admitting: Cardiovascular Disease

## 2014-01-07 VITALS — BP 124/92 | HR 64 | Ht 69.0 in | Wt 221.2 lb

## 2014-01-07 DIAGNOSIS — I471 Supraventricular tachycardia, unspecified: Secondary | ICD-10-CM

## 2014-01-07 DIAGNOSIS — I428 Other cardiomyopathies: Secondary | ICD-10-CM

## 2014-01-07 DIAGNOSIS — I10 Essential (primary) hypertension: Secondary | ICD-10-CM

## 2014-01-07 DIAGNOSIS — R0602 Shortness of breath: Secondary | ICD-10-CM

## 2014-01-07 MED ORDER — CARVEDILOL 6.25 MG PO TABS: 6.2500 mg | ORAL_TABLET | Freq: Two times a day (BID) | ORAL | Status: DC

## 2014-01-07 MED ORDER — FUROSEMIDE 20 MG PO TABS: 20.0000 mg | ORAL_TABLET | Freq: Every day | ORAL | Status: DC

## 2014-01-07 MED ORDER — SACUBITRIL-VALSARTAN 97-103 MG PO TABS: 97.0000 mg | ORAL_TABLET | Freq: Two times a day (BID) | ORAL | Status: DC

## 2014-01-07 MED ORDER — ENALAPRIL MALEATE 2.5 MG PO TABS: 2.5000 mg | ORAL_TABLET | Freq: Every day | ORAL | Status: DC

## 2014-01-07 MED ORDER — SACUBITRIL-VALSARTAN 49-51 MG PO TABS: 1.0000 | ORAL_TABLET | Freq: Two times a day (BID) | ORAL | Status: DC

## 2014-01-07 NOTE — Assessment & Plan Note (Signed)
Significant medication noncompliance Again we have given samples of entresto 49/51 mg twice a day, encouraged him to stay on his Coreg and Lasix daily. Suggested he restart enalapril.

## 2014-01-07 NOTE — Progress Notes (Signed)
Patient ID: Steve Salazar, male    DOB: 11-27-1950, 63 y.o.   MRN: [id number]  HPI Comments: Steve Salazar is a pleasant 63 year old gentleman with admission to [facility] on 07/12/10 and discharged on 3/17 with shortness of breath, systolic congestive heart failure with ejection fraction 25%, nonischemic cardiomyopathy with negative stress test who presents for followup.  On his last clinic visit, taking his medications for 2 months, had worsening shortness of breath, leg swelling, fatigue. He was having Difficulty sleeping in a supine position. Also difficulty bending over to put on his shoes. Reports that he is unable to afford many of his medications.  He was started on entresto, Coreg twice a day, Lasix daily. Samples were given In followup today, he has run out of samples of entresto, only recently picked up the Coreg, has been taking Lasix only as needed In general he feels better with no complaints. He feels back to his baseline   Previous Stress test showed severe cardiomyopathy with ejection fraction 25%, dilated left ventricle, low risk scan. Initial BNP on arrival to the hospital was 1342 He denied any history of drug or alcohol use, no cocaine. Previous lab work showing Total cholesterol 167, LDL 74, HDL 77.   Echo showing moderately dilated left ventricle, ejection fraction 25 to 30%, diastolic relaxation abnormality, normal RV size and function, mild to moderate mitral regurgitation, mild to moderately dilated left atrium  EKG shows normal sinus rhythm with rate 64 beats per minute with T wave abnormality in V4 through V6, I and AVL.   Outpatient Encounter Prescriptions as of 01/07/2014  Medication Sig  . aspirin EC 325 MG tablet Take 325 mg by mouth daily.    . carvedilol (COREG) 6.25 MG tablet Take 1 tablet (6.25 mg total) by mouth 2 (two) times daily with a meal.  . digoxin (LANOXIN) 0.125 MG tablet Take 1 tablet (0.125 mg total) by mouth daily.  .  enalapril (VASOTEC) 2.5 MG tablet take 1 tablet by mouth once daily  . furosemide (LASIX) 20 MG tablet Take 1 tablet (20 mg total) by mouth 2 (two) times daily as needed for edema.  . Omega-3 Fatty Acids (FISH OIL) 1000 MG CAPS Take 1,000 mg by mouth daily.    . Sacubitril-Valsartan (ENTRESTO) 97-103 MG TABS Take 97-103 mg by mouth 2 (two) times daily.  . simvastatin (ZOCOR) 20 MG tablet Take 1 tablet (20 mg total) by mouth daily.  . tadalafil (CIALIS) 20 MG tablet Take 10 mg by mouth. Take 30 minutes prior to activity. May increase to 1 tablet ( ) as needed.      Review of Systems  Constitutional: Negative.   HENT: Negative.   Eyes: Negative.   Respiratory: Negative.   Cardiovascular: Negative.   Gastrointestinal: Negative.   Endocrine: Negative.   Musculoskeletal: Negative.   Skin: Negative.   Allergic/Immunologic: Negative.   Neurological: Negative.   Hematological: Negative.   Psychiatric/Behavioral: Negative.   All other systems reviewed and are negative.   BP 124/92  Pulse 64  Ht  (1.753 m)  Wt 221 lb 4 oz (100.358 kg)  BMI 32.66 kg/m2  Physical Exam  Nursing note and vitals reviewed. Constitutional: He is oriented to person, place, and time. He appears well-developed and well-nourished.  HENT:  Head: Normocephalic.  Nose: Nose normal.  Mouth/Throat: Oropharynx is clear and moist.  Eyes: Conjunctivae are normal. Pupils are equal, round, and reactive to light.  Neck: Normal range of motion. Neck supple.  No JVD present.  Cardiovascular: Normal rate, regular rhythm, S1 normal, S2 normal, normal heart sounds and intact distal pulses.  Exam reveals no gallop and no friction rub.   No murmur heard. Pulmonary/Chest: Effort normal and breath sounds normal. No respiratory distress. He has no wheezes. He has no rales. He exhibits no tenderness.  Abdominal: Soft. Bowel sounds are normal. He exhibits no distension. There is no tenderness.  Musculoskeletal: Normal range  of motion. He exhibits no edema and no tenderness.  Lymphadenopathy:    He has no cervical adenopathy.  Neurological: He is alert and oriented to person, place, and time. Coordination normal.  Skin: Skin is warm and dry. No rash noted. No erythema.  Psychiatric: He has a normal mood and affect. His behavior is normal. Judgment and thought content normal.      Assessment and Plan

## 2014-01-07 NOTE — Assessment & Plan Note (Signed)
Encouraged him to take his Lasix daily, stressed medication compliance

## 2014-01-07 NOTE — Patient Instructions (Signed)
You are doing well.  Please continue on the entresto one pill twice a day Continue coreg one pill twice a day Lasix daily as tolerated Continue enalapril one pill a day  If you develop shortness of breath, Take extra lasix  Please call us if you have new issues that need to be addressed before your next appt.  Your physician wants you to follow-up in: 6 months.  You will receive a reminder letter in the mail two months in advance. If you don't receive a letter, please call our office to schedule the follow-up appointment.

## 2014-01-07 NOTE — Assessment & Plan Note (Signed)
We'll restart the medications as above.

## 2014-07-27 ENCOUNTER — Emergency Department: Payer: Self-pay | Admitting: Emergency Medicine

## 2014-07-27 ENCOUNTER — Other Ambulatory Visit: Payer: Self-pay | Admitting: *Deleted

## 2014-07-27 LAB — CBC
HCT: 43.3 % (ref 40.0–52.0)
HGB: 13.7 g/dL (ref 13.0–18.0)
MCH: 27.7 pg (ref 26.0–34.0)
MCHC: 31.6 g/dL — ABNORMAL LOW (ref 32.0–36.0)
MCV: 88 fL (ref 80–100)
Platelet: 181 10*3/uL (ref 150–440)
RBC: 4.94 10*6/uL (ref 4.40–5.90)
RDW: 15.4 % — ABNORMAL HIGH (ref 11.5–14.5)
WBC: 6.5 10*3/uL (ref 3.8–10.6)

## 2014-07-27 LAB — COMPREHENSIVE METABOLIC PANEL
AST: 38 U/L
Albumin: 4.5 g/dL
Alkaline Phosphatase: 59 U/L
Anion Gap: 7 (ref 7–16)
BUN: 16 mg/dL
Bilirubin,Total: 1 mg/dL
CALCIUM: 8.9 mg/dL
Chloride: 107 mmol/L
Co2: 27 mmol/L
Creatinine: 1.31 mg/dL — ABNORMAL HIGH
GFR CALC NON AF AMER: 57 — AB
Glucose: 125 mg/dL — ABNORMAL HIGH
POTASSIUM: 4 mmol/L
SGPT (ALT): 41 U/L
Sodium: 141 mmol/L
Total Protein: 7.6 g/dL

## 2014-07-27 LAB — PRO B NATRIURETIC PEPTIDE: B-Type Natriuretic Peptide: 1180 pg/mL — ABNORMAL HIGH

## 2014-07-27 LAB — TROPONIN I
Troponin-I: 0.04 ng/mL — ABNORMAL HIGH
Troponin-I: 0.04 ng/mL — ABNORMAL HIGH

## 2014-07-27 NOTE — Telephone Encounter (Signed)
Patient requesting samples,

## 2014-07-27 NOTE — Telephone Encounter (Signed)
Patient wanted samples of cialis.

## 2014-07-27 NOTE — Telephone Encounter (Signed)
Samples of Cialis given.

## 2014-08-12 ENCOUNTER — Encounter: Payer: Self-pay | Admitting: Cardiovascular Disease

## 2014-08-12 ENCOUNTER — Ambulatory Visit (INDEPENDENT_AMBULATORY_CARE_PROVIDER_SITE_OTHER): Payer: Managed Care, Other (non HMO) | Admitting: Cardiovascular Disease

## 2014-08-12 VITALS — BP 104/72 | HR 73 | Ht 71.0 in | Wt 225.5 lb

## 2014-08-12 DIAGNOSIS — R0602 Shortness of breath: Secondary | ICD-10-CM

## 2014-08-12 DIAGNOSIS — I471 Supraventricular tachycardia, unspecified: Secondary | ICD-10-CM

## 2014-08-12 DIAGNOSIS — I5022 Chronic systolic (congestive) heart failure: Secondary | ICD-10-CM | POA: Insufficient documentation

## 2014-08-12 DIAGNOSIS — I5023 Acute on chronic systolic (congestive) heart failure: Secondary | ICD-10-CM | POA: Diagnosis not present

## 2014-08-12 DIAGNOSIS — I10 Essential (primary) hypertension: Secondary | ICD-10-CM

## 2014-08-12 DIAGNOSIS — I420 Dilated cardiomyopathy: Secondary | ICD-10-CM

## 2014-08-12 MED ORDER — FUROSEMIDE 40 MG PO TABS: 40.0000 mg | ORAL_TABLET | Freq: Two times a day (BID) | ORAL | Status: DC

## 2014-08-12 MED ORDER — ENALAPRIL MALEATE 2.5 MG PO TABS: 2.5000 mg | ORAL_TABLET | Freq: Every day | ORAL | Status: DC

## 2014-08-12 MED ORDER — CARVEDILOL 6.25 MG PO TABS: 6.2500 mg | ORAL_TABLET | Freq: Two times a day (BID) | ORAL | Status: DC

## 2014-08-12 MED ORDER — METOLAZONE 5 MG PO TABS: 5.0000 mg | ORAL_TABLET | Freq: Every day | ORAL | Status: DC | PRN

## 2014-08-12 NOTE — Assessment & Plan Note (Signed)
Blood pressure is well controlled on today's visit. No changes made to the medications. 

## 2014-08-12 NOTE — Patient Instructions (Signed)
You are doing well.  Please take lasix 40 mg twice a day Go to lasix 40 mg daily when weight is at goal, 210 pounds  Take with potassium daily  For weight up or ABd swelling, Take metolazone one pill with lasix 40 mg  Take sparingly  Please call us if you have new issues that need to be addressed before your next appt.  Your physician wants you to follow-up in: 3 months.  You will receive a reminder letter in the mail two months in advance. If you don't receive a letter, please call our office to schedule the follow-up appointment.

## 2014-08-12 NOTE — Assessment & Plan Note (Signed)
High fluid intake with abdominal swelling, recent trip to the emergency room. Recommended he decrease his fluid intake, increase Lasix up to 40 mg twice a day. In extremes circumstances when he has abdominal bloating and shortness of breath, recommended he take metolazone 5 mg sparingly prior to his morning Lasix

## 2014-08-12 NOTE — Assessment & Plan Note (Signed)
Presumed nonischemic cardiomyopathy. Medications as above, will increase his diuretics

## 2014-08-12 NOTE — Progress Notes (Signed)
Patient ID: Steve Salazar, male    DOB: 1951-04-20, 64 y.o.   MRN: [id number]  HPI Comments: Steve Salazar is a pleasant 64 year old gentleman with admission to [facility] on 07/12/10 and discharged on 3/17 with shortness of breath, systolic congestive heart failure with ejection fraction 25%, nonischemic cardiomyopathy with negative stress test who presents for followup of his cardiomyopathy  He reports that he was in the emergency room last week for shortness of breath, abdominal bloating He was given Lasix IV with improvement of his symptoms Lab work reviewed with him, Potassium in the hospital for 4.0, BNP 1200,  chest x-ray mild interstitial edema  Also reports having upper respiratory tract infection a week or 2 ago, took over-the-counter cold medications, then had tachycardia with rate up to 140 bpm Symptoms resolved on their own without intervention. Since then he has been using different cold medication under the guidance of the pharmacist  He reports having frequent abdominal bloating, fullness. Less swelling in his legs, mostly shortness of breath, cough, abdominal bloating Drinks lots of fluids in the day, takes Lasix 40 mg daily since being in the emergency room Still feels bloated Review of his medications, he is only taking Lasix, enalapril, carvedilol Not taking digoxin, simvastatin Previously unable to afford many of his medications  EKG on today's visit shows normal sinus rhythm with rate 73 bpm, T-wave abnormality through anterior precordial leads, one and aVL  Other past medical history Previous Stress test showed severe cardiomyopathy with ejection fraction 25%, dilated left ventricle, low risk scan. Initial BNP on arrival to the hospital was 1342 He denied any history of drug or alcohol use, no cocaine. Previous lab work showing Total cholesterol 167, LDL 74, HDL 77.   Echo showing moderately dilated left ventricle, ejection fraction 25 to  30%, diastolic relaxation abnormality, normal RV size and function, mild to moderate mitral regurgitation, mild to moderately dilated left atrium   No Known Allergies  Outpatient Encounter Prescriptions as of 08/12/2014  Medication Sig  . aspirin EC 81 MG tablet Take 1 tablet (81 mg total) by mouth daily.  . carvedilol (COREG) 6.25 MG tablet Take 1 tablet (6.25 mg total) by mouth 2 (two) times daily with a meal.  . enalapril (VASOTEC) 2.5 MG tablet Take 1 tablet (2.5 mg total) by mouth daily.  . furosemide (LASIX) 40 MG tablet Take 1 tablet (40 mg total) by mouth 2 (two) times daily.  . Omega-3 Fatty Acids (FISH OIL) 1000 MG CAPS Take 1,000 mg by mouth daily.    . tadalafil (CIALIS) 20 MG tablet Take 10 mg by mouth. Take 30 minutes prior to activity. May increase to 1 tablet ( ) as needed.     Past Medical History  Diagnosis Date  . CHF (congestive heart failure)   . Hypertensive cardiomyopathy     EF 35%  . Arrhythmia     SVT verus A-fib  . Hypertension   . Renal insufficiency     Mild chronic    History reviewed. No pertinent past surgical history.  Social History  reports that he has quit smoking. His smoking use included Cigarettes. He does not have any smokeless tobacco history on file. He reports that he drinks about 1.2 oz of alcohol per week. He reports that he does not use illicit drugs.  Family History family history includes Heart attack in his father.   Review of Systems  Constitutional: Negative.   Respiratory: Positive for shortness of breath.  Cardiovascular: Negative.        Abdominal bloating  Gastrointestinal: Negative.   Musculoskeletal: Negative.   Neurological: Negative.   Hematological: Negative.   Psychiatric/Behavioral: Negative.   All other systems reviewed and are negative.   BP 104/72 mmHg  Pulse 73  Ht 5\' 11"  (1.803 m)  Wt 225 lb 8 oz (102.286 kg)  BMI 31.46 kg/m2  Physical Exam  Constitutional: He is oriented to person, place,  and time. He appears well-developed and well-nourished.  HENT:  Head: Normocephalic.  Nose: Nose normal.  Mouth/Throat: Oropharynx is clear and moist.  Eyes: Conjunctivae are normal. Pupils are equal, round, and reactive to light.  Neck: Normal range of motion. Neck supple. No JVD present.  Cardiovascular: Normal rate, regular rhythm, S1 normal, S2 normal, normal heart sounds and intact distal pulses.  Exam reveals no gallop and no friction rub.   No murmur heard. Pulmonary/Chest: Effort normal and breath sounds normal. No respiratory distress. He has no wheezes. He has no rales. He exhibits no tenderness.  Abdominal: Soft. Bowel sounds are normal. He exhibits no distension. There is no tenderness.  Musculoskeletal: Normal range of motion. He exhibits no edema or tenderness.  Lymphadenopathy:    He has no cervical adenopathy.  Neurological: He is alert and oriented to person, place, and time. Coordination normal.  Skin: Skin is warm and dry. No rash noted. No erythema.  Psychiatric: He has a normal mood and affect. His behavior is normal. Judgment and thought content normal.      Assessment and Plan   Nursing note and vitals reviewed.

## 2014-08-12 NOTE — Assessment & Plan Note (Signed)
Shortness of breath and abdominal swelling from acute on chronic systolic CHF. Plan as above

## 2014-08-13 ENCOUNTER — Telehealth: Payer: Self-pay | Admitting: *Deleted

## 2014-08-13 NOTE — Telephone Encounter (Signed)
Pt sister calling, she is from [facility] and wants to know about pt.  She has questions about pt, What is the Heart damage?  What medication he is on now? What's the overall causing of the coughing, is it fluid?  Are we going to do further treatment?  Please advise.

## 2014-08-13 NOTE — Telephone Encounter (Signed)
Spoke w/ pt's sister Eber Jones.  She states that she lives in [place] and that she wants to speak to Dr. Mariah Milling about pt's ov yesterday.  Advised her that Dr. Mariah Milling is not in the office but I will try to help her.  She asks about pt's med list and changes that were made. Reviewed these w/ her.  She asks why pt did not have any labs or x-rays done while here. Advised her that pt was recently in the ED and these done there. Reviewed pt's AVS. She is appreciative of the call and will call back w/ any questions or concerns.

## 2014-11-12 ENCOUNTER — Ambulatory Visit: Payer: Managed Care, Other (non HMO) | Admitting: Cardiovascular Disease

## 2015-01-05 ENCOUNTER — Other Ambulatory Visit: Payer: Self-pay

## 2015-01-05 MED ORDER — FUROSEMIDE 40 MG PO TABS: 40.0000 mg | ORAL_TABLET | Freq: Two times a day (BID) | ORAL | Status: DC

## 2015-01-05 MED ORDER — CARVEDILOL 6.25 MG PO TABS: 6.2500 mg | ORAL_TABLET | Freq: Two times a day (BID) | ORAL | Status: DC

## 2015-01-07 ENCOUNTER — Ambulatory Visit (INDEPENDENT_AMBULATORY_CARE_PROVIDER_SITE_OTHER): Payer: Managed Care, Other (non HMO) | Admitting: Cardiovascular Disease

## 2015-01-07 ENCOUNTER — Telehealth: Payer: Self-pay | Admitting: Cardiovascular Disease

## 2015-01-07 ENCOUNTER — Encounter: Payer: Self-pay | Admitting: Cardiovascular Disease

## 2015-01-07 VITALS — BP 142/98 | HR 68 | Ht 71.0 in | Wt 225.5 lb

## 2015-01-07 DIAGNOSIS — I10 Essential (primary) hypertension: Secondary | ICD-10-CM

## 2015-01-07 DIAGNOSIS — R0602 Shortness of breath: Secondary | ICD-10-CM | POA: Diagnosis not present

## 2015-01-07 DIAGNOSIS — I5023 Acute on chronic systolic (congestive) heart failure: Secondary | ICD-10-CM

## 2015-01-07 DIAGNOSIS — I420 Dilated cardiomyopathy: Secondary | ICD-10-CM

## 2015-01-07 MED ORDER — FUROSEMIDE 40 MG PO TABS: 40.0000 mg | ORAL_TABLET | Freq: Two times a day (BID) | ORAL | Status: DC

## 2015-01-07 MED ORDER — SACUBITRIL-VALSARTAN 49-51 MG PO TABS: 1.0000 | ORAL_TABLET | Freq: Two times a day (BID) | ORAL | Status: DC

## 2015-01-07 MED ORDER — CARVEDILOL 6.25 MG PO TABS: 6.2500 mg | ORAL_TABLET | Freq: Two times a day (BID) | ORAL | Status: DC

## 2015-01-07 NOTE — Telephone Encounter (Signed)
Patient cannot afford entresto rx Gollan gave him today.  Samples vs rx change.

## 2015-01-07 NOTE — Assessment & Plan Note (Signed)
Prior cardiac study showing depressed ejection fraction less than 35%. Discussed heart failure management with him, entresto started He has difficulty reporting some of his medications

## 2015-01-07 NOTE — Assessment & Plan Note (Signed)
Recommended he take Lasix daily if not twice a day dosing for recent symptoms of acute on chronic systolic CHF. Discussed with him that if he drinks more fluids, he will need Lasix twice a day Also stressed to him the importance of staying on his other medications We'll continue him on Coreg 6.25 mg twice a day We will start entresto 49/51 mg by mouth twice a day. Samples and coupon provided. Likely need assistance with the latter

## 2015-01-07 NOTE — Assessment & Plan Note (Signed)
Shortness of breath likely from medication noncompliance, acute on chronic systolic CHF. Blood pressure elevated today. Not taking 2 of his medications

## 2015-01-07 NOTE — Telephone Encounter (Signed)
Can we see if he will qualify for assistance through the company

## 2015-01-07 NOTE — Assessment & Plan Note (Signed)
Restart his medications as above

## 2015-01-07 NOTE — Telephone Encounter (Signed)
Pt was provided w/ coupon card.  He is concerned that cost is too high for a long term med and that we may not be able to keep providing him samples.

## 2015-01-07 NOTE — Patient Instructions (Signed)
Please stay on lasix 1 to 2 a daily for fluid Restart coreg twice a day  Start entresto twice a day for heart failure (CHF), fluid, shortness of breath  Please call us if you have new issues that need to be addressed before your next appt.  Your physician wants you to follow-up in: 3 months.

## 2015-01-07 NOTE — Progress Notes (Signed)
Patient ID: Steve Salazar, male    DOB: [birth year], 64 y.o.   MRN: [id number]  HPI Comments: Steve Salazar is a pleasant 64 year old gentleman with admission to [facility] on 07/12/10 and discharged on 3/17 with shortness of breath, systolic congestive heart failure with ejection fraction 25%, nonischemic cardiomyopathy with negative stress test who presents for followup of his cardiomyopathy  In follow-up today, he reports that he does not have carvedilol or enalapril. He does have Lasix and has been taking Lasix 40 mg daily. He has not been tracking his weight at home. He does have some shortness of breath, particularly when he lays down with coughing. Denies having leg edema. Possibly some abdominal bloating. Better than his prior clinic visit in April 2016. Wife presents with him today He continues to work.  Does drink significant fluids Unable to afford many of his medications  Other past medical history April 2013 was in the emergency room last week for shortness of breath, abdominal bloating He was given Lasix IV with improvement of his symptoms Lab work reviewed with him, Potassium in the hospital for 4.0, BNP 1200,  chest x-ray mild interstitial edema  Previous Stress test showed severe cardiomyopathy with ejection fraction 25%, dilated left ventricle, low risk scan. Initial BNP on arrival to the hospital was 1342 He denied any history of drug or alcohol use, no cocaine. Previous lab work showing Total cholesterol 167, LDL 74, HDL 77.   Echo showing moderately dilated left ventricle, ejection fraction 25 to 30%, diastolic relaxation abnormality, normal RV size and function, mild to moderate mitral regurgitation, mild to moderately dilated left atrium   No Known Allergies  Medications Ran out of his carvedilol and enalapril Only taking Lasix 40 mg daily  Past Medical History  Diagnosis Date  . CHF (congestive heart failure)   . Hypertensive  cardiomyopathy     EF 35%  . Arrhythmia     SVT verus A-fib  . Hypertension   . Renal insufficiency     Mild chronic    History reviewed. No pertinent past surgical history.  Social History  reports that he has quit smoking. His smoking use included Cigarettes. He does not have any smokeless tobacco history on file. He reports that he drinks about 1.2 oz of alcohol per week. He reports that he does not use illicit drugs.  Family History family history includes Heart attack in his father.   Review of Systems  Constitutional: Negative.   Respiratory: Positive for shortness of breath.   Cardiovascular: Negative.        Abdominal bloating  Gastrointestinal: Negative.   Musculoskeletal: Negative.   Neurological: Negative.   Hematological: Negative.   Psychiatric/Behavioral: Negative.   All other systems reviewed and are negative.   BP 142/98 mmHg  Pulse 68  Ht 5\' 11"  (1.803 m)  Wt 225 lb 8 oz (102.286 kg)  BMI 31.46 kg/m2  Physical Exam  Constitutional: He is oriented to person, place, and time. He appears well-developed and well-nourished.  HENT:  Head: Normocephalic.  Nose: Nose normal.  Mouth/Throat: Oropharynx is clear and moist.  Eyes: Conjunctivae are normal. Pupils are equal, round, and reactive to light.  Neck: Normal range of motion. Neck supple. No JVD present.  Cardiovascular: Normal rate, regular rhythm, S1 normal, S2 normal, normal heart sounds and intact distal pulses.  Exam reveals no gallop and no friction rub.   No murmur heard. Pulmonary/Chest: Effort normal and breath sounds normal. No respiratory distress.  He has no wheezes. He has no rales. He exhibits no tenderness.  Abdominal: Soft. Bowel sounds are normal. He exhibits no distension. There is no tenderness.  Musculoskeletal: Normal range of motion. He exhibits no edema or tenderness.  Lymphadenopathy:    He has no cervical adenopathy.  Neurological: He is alert and oriented to person, place, and  time. Coordination normal.  Skin: Skin is warm and dry. No rash noted. No erythema.  Psychiatric: He has a normal mood and affect. His behavior is normal. Judgment and thought content normal.      Assessment and Plan   Nursing note and vitals reviewed.

## 2015-01-10 NOTE — Telephone Encounter (Signed)
Entresto patient support program paperwork mailed to pt.

## 2015-03-14 ENCOUNTER — Telehealth: Payer: Self-pay | Admitting: Cardiovascular Disease

## 2015-03-14 NOTE — Telephone Encounter (Signed)
Patient IN OFFICE  for samples of medication said he called earlier today   1.  What medication and dosage are you requesting samples for?  Entresto 49-51 mg 1 tab po twice daily;  Cialis 10 mg po prn 30 min prior to activity  May increase to 20 mg as needed   2.  Are you currently out of this medication?   Yes

## 2015-03-14 NOTE — Telephone Encounter (Signed)
Gave patient samples of Cialis 20 mg but no samples available on Entresto.

## 2015-04-06 ENCOUNTER — Encounter: Payer: Self-pay | Admitting: *Deleted

## 2015-04-11 ENCOUNTER — Ambulatory Visit: Payer: Managed Care, Other (non HMO) | Admitting: Cardiovascular Disease

## 2015-05-17 ENCOUNTER — Telehealth: Payer: Self-pay | Admitting: *Deleted

## 2015-05-17 NOTE — Telephone Encounter (Signed)
Patient requesting samples for tadalafil (CIALIS) 20 MG tablet. Please call when ready for pickup. thanks

## 2015-05-17 NOTE — Telephone Encounter (Signed)
Notified patient no samples available of Cialis 20 mg. The patient will call back another time.

## 2015-05-30 ENCOUNTER — Other Ambulatory Visit: Payer: Self-pay | Admitting: Cardiovascular Disease

## 2015-06-01 ENCOUNTER — Encounter: Payer: Self-pay | Admitting: Cardiovascular Disease

## 2015-06-01 ENCOUNTER — Ambulatory Visit (INDEPENDENT_AMBULATORY_CARE_PROVIDER_SITE_OTHER): Payer: Medicare HMO | Admitting: Cardiovascular Disease

## 2015-06-01 VITALS — BP 144/112 | HR 71 | Ht 71.0 in | Wt 228.5 lb

## 2015-06-01 DIAGNOSIS — I420 Dilated cardiomyopathy: Secondary | ICD-10-CM | POA: Diagnosis not present

## 2015-06-01 DIAGNOSIS — I5023 Acute on chronic systolic (congestive) heart failure: Secondary | ICD-10-CM

## 2015-06-01 DIAGNOSIS — N529 Male erectile dysfunction, unspecified: Secondary | ICD-10-CM | POA: Insufficient documentation

## 2015-06-01 DIAGNOSIS — I10 Essential (primary) hypertension: Secondary | ICD-10-CM

## 2015-06-01 DIAGNOSIS — N522 Drug-induced erectile dysfunction: Secondary | ICD-10-CM

## 2015-06-01 MED ORDER — FUROSEMIDE 40 MG PO TABS: 40.0000 mg | ORAL_TABLET | Freq: Every day | ORAL | Status: DC

## 2015-06-01 MED ORDER — CARVEDILOL 6.25 MG PO TABS: 6.2500 mg | ORAL_TABLET | Freq: Two times a day (BID) | ORAL | Status: DC

## 2015-06-01 MED ORDER — SACUBITRIL-VALSARTAN 49-51 MG PO TABS: 1.0000 | ORAL_TABLET | Freq: Two times a day (BID) | ORAL | Status: DC

## 2015-06-01 MED ORDER — TADALAFIL 20 MG PO TABS: 20.0000 mg | ORAL_TABLET | Freq: Every day | ORAL | Status: DC | PRN

## 2015-06-01 NOTE — Assessment & Plan Note (Signed)
Cardiac meds causing ED, We have renewed cialis

## 2015-06-01 NOTE — Progress Notes (Signed)
Patient ID: Steve Salazar, male    DOB: [birth year], 65 y.o.   MRN: [id number]  HPI Comments: Steve Salazar is a pleasant 65 year old gentleman with admission to [facility] on 07/12/10 and discharged on 3/17 with shortness of breath, systolic congestive heart failure with ejection fraction 25%, nonischemic cardiomyopathy with negative stress test who presents for followup of his cardiomyopathy.    in follow-up today, he reports that he takes Lasix, carvedilol, does not have any entresto.  He did not have insurance to afford this.  Does not check his blood pressure at home.  Denies any significant shortness of breath on exertion, continues to work.  he denies any significant lower extremity edema  Significant fluid intake  Now has Medicare, able to afford his medications  Previously had abdominal bloating, this has resolved  reports his weight is stable   EKG not performed today  Other past medical history April 2013 was in the emergency room last week for shortness of breath, abdominal bloating He was given Lasix IV with improvement of his symptoms Lab work reviewed with him, Potassium in the hospital for 4.0, BNP 1200,  chest x-ray mild interstitial edema  Previous Stress test showed severe cardiomyopathy with ejection fraction 25%, dilated left ventricle, low risk scan. Initial BNP on arrival to the hospital was 1342 He denied any history of drug or alcohol use, no cocaine. Previous lab work showing Total cholesterol 167, LDL 74, HDL 77.   Echo showing moderately dilated left ventricle, ejection fraction 25 to 30%, diastolic relaxation abnormality, normal RV size and function, mild to moderate mitral regurgitation, mild to moderately dilated left atrium   No Known Allergies  Medications Ran out of his carvedilol and enalapril Only taking Lasix 40 mg daily  Past Medical History  Diagnosis Date  . CHF (congestive heart failure) (HCC)   . Hypertensive  cardiomyopathy (HCC)     EF 35%  . Arrhythmia     SVT verus A-fib  . Hypertension   . Renal insufficiency     Mild chronic    History reviewed. No pertinent past surgical history.  Social History  reports that he has quit smoking. His smoking use included Cigarettes. He does not have any smokeless tobacco history on file. He reports that he drinks about 1.2 oz of alcohol per week. He reports that he does not use illicit drugs.  Family History family history includes Heart attack in his father.   Review of Systems  Constitutional: Negative.   Respiratory: Negative.   Cardiovascular: Negative.   Gastrointestinal: Negative.   Musculoskeletal: Negative.   Neurological: Negative.   Hematological: Negative.   Psychiatric/Behavioral: Negative.   All other systems reviewed and are negative.   BP 144/112 mmHg  Pulse 71  Ht  (1.803 m)  Wt 228 lb 8 oz (103.647 kg)  BMI 31.88 kg/m2  Physical Exam  Constitutional: He is oriented to person, place, and time. He appears well-developed and well-nourished.  HENT:  Head: Normocephalic.  Nose: Nose normal.  Mouth/Throat: Oropharynx is clear and moist.  Eyes: Conjunctivae are normal. Pupils are equal, round, and reactive to light.  Neck: Normal range of motion. Neck supple. No JVD present.  Cardiovascular: Normal rate, regular rhythm, S1 normal, S2 normal, normal heart sounds and intact distal pulses.  Exam reveals no gallop and no friction rub.   No murmur heard. Pulmonary/Chest: Effort normal and breath sounds normal. No respiratory distress. He has no wheezes. He has no rales.  He exhibits no tenderness.  Abdominal: Soft. Bowel sounds are normal. He exhibits no distension. There is no tenderness.  Musculoskeletal: Normal range of motion. He exhibits no edema or tenderness.  Lymphadenopathy:    He has no cervical adenopathy.  Neurological: He is alert and oriented to person, place, and time. Coordination normal.  Skin: Skin is  warm and dry. No rash noted. No erythema.  Psychiatric: He has a normal mood and affect. His behavior is normal. Judgment and thought content normal.      Assessment and Plan   Nursing note and vitals reviewed.

## 2015-06-01 NOTE — Patient Instructions (Signed)
You are doing well.   Please restart your medications as detailed below entresto and coreg twice a day Lasix once a day Cialis as needed  Please call us if you have new issues that need to be addressed before your next appt.  Your physician wants you to follow-up in: 6 months.  You will receive a reminder letter in the mail two months in advance. If you don't receive a letter, please call our office to schedule the follow-up appointment.

## 2015-06-01 NOTE — Assessment & Plan Note (Signed)
Blood pressure is elevated today, we have renewed all of his medications.  Encouraged him to monitor his blood pressure at home and call our office if this continues to run high

## 2015-06-01 NOTE — Assessment & Plan Note (Signed)
Long history of medication noncompliance secondary to insurance issues.  Now has Medicare, able to afford some of his medications.  We have refilled his Lasix, carvedilol, entresto.  We have filled out paperwork for the [facility]  Assistance program.  Recommended he call us if he is still unable to afford the medication.   Alternate Medication would be needed for systolic heart failure, hypertension

## 2015-06-01 NOTE — Assessment & Plan Note (Signed)
Appears relatively euvolemic on today's visit despite medication noncompliance. In general has tolerated his cardiomyopathy well.  We'll restart his medications as above

## 2015-06-03 ENCOUNTER — Other Ambulatory Visit: Payer: Self-pay

## 2015-06-03 MED ORDER — FUROSEMIDE 40 MG PO TABS: 40.0000 mg | ORAL_TABLET | Freq: Every day | ORAL | Status: DC

## 2015-06-03 MED ORDER — METOLAZONE 5 MG PO TABS: 5.0000 mg | ORAL_TABLET | Freq: Every day | ORAL | Status: DC | PRN

## 2015-06-03 MED ORDER — CARVEDILOL 6.25 MG PO TABS: 6.2500 mg | ORAL_TABLET | Freq: Two times a day (BID) | ORAL | Status: DC

## 2015-06-03 NOTE — Telephone Encounter (Signed)
Refill sent [facility] pharmacy for 90 day supply.

## 2015-06-29 ENCOUNTER — Telehealth: Payer: Self-pay | Admitting: Cardiovascular Disease

## 2015-06-29 NOTE — Telephone Encounter (Signed)
Patient wants to know if we have gotten any samples of cialis

## 2015-06-29 NOTE — Telephone Encounter (Signed)
No samples of Cialis available.

## 2015-06-29 NOTE — Telephone Encounter (Signed)
Unable to reach pt due to number provided unreachable no VM.

## 2015-08-02 ENCOUNTER — Telehealth: Payer: Self-pay | Admitting: Cardiovascular Disease

## 2015-08-02 NOTE — Telephone Encounter (Signed)
PA has been started awaiting MD approval and signature.

## 2015-08-02 NOTE — Telephone Encounter (Signed)
Pt came into office and dropped of paper work from [employer] we need to approve coverage for [employer] 51 mg  Placed in Refill bin [employer]

## 2015-08-23 NOTE — Telephone Encounter (Signed)
Awaiting missing Class identification and MD signature.

## 2015-08-23 NOTE — Telephone Encounter (Signed)
Pa has been faxed awaiting approval.

## 2015-08-24 ENCOUNTER — Telehealth: Payer: Self-pay

## 2015-08-24 NOTE — Telephone Encounter (Signed)
Notified the [facility] pharmacy the Entresto 49/51 mg has been approved until 08/23/2017.

## 2015-08-24 NOTE — Telephone Encounter (Signed)
Spoke with pharmacist Entresto 49 mg-51 mg tablet has been approved for coverage until 08/23/2017.

## 2015-09-06 ENCOUNTER — Telehealth: Payer: Self-pay | Admitting: Cardiovascular Disease

## 2015-09-06 NOTE — Telephone Encounter (Signed)
Patient wants to know if we have gotten any samples of cialis Please call back he is completely out

## 2015-09-06 NOTE — Telephone Encounter (Signed)
No cialis samples available.

## 2015-09-29 DIAGNOSIS — Z833 Family history of diabetes mellitus: Secondary | ICD-10-CM | POA: Insufficient documentation

## 2015-09-29 DIAGNOSIS — K4090 Unilateral inguinal hernia, without obstruction or gangrene, not specified as recurrent: Secondary | ICD-10-CM | POA: Insufficient documentation

## 2016-01-13 DIAGNOSIS — N400 Enlarged prostate without lower urinary tract symptoms: Secondary | ICD-10-CM | POA: Insufficient documentation

## 2016-03-02 ENCOUNTER — Ambulatory Visit (INDEPENDENT_AMBULATORY_CARE_PROVIDER_SITE_OTHER): Payer: Medicare HMO | Admitting: Cardiovascular Disease

## 2016-03-02 ENCOUNTER — Encounter: Payer: Self-pay | Admitting: Cardiovascular Disease

## 2016-03-02 VITALS — BP 108/88 | HR 66 | Ht 71.0 in | Wt 223.5 lb

## 2016-03-02 DIAGNOSIS — R001 Bradycardia, unspecified: Secondary | ICD-10-CM

## 2016-03-02 DIAGNOSIS — I5023 Acute on chronic systolic (congestive) heart failure: Secondary | ICD-10-CM | POA: Diagnosis not present

## 2016-03-02 DIAGNOSIS — I10 Essential (primary) hypertension: Secondary | ICD-10-CM

## 2016-03-02 DIAGNOSIS — I471 Supraventricular tachycardia: Secondary | ICD-10-CM

## 2016-03-02 DIAGNOSIS — R0602 Shortness of breath: Secondary | ICD-10-CM

## 2016-03-02 MED ORDER — METOLAZONE 5 MG PO TABS: 5.0000 mg | ORAL_TABLET | Freq: Every day | ORAL | 6 refills | Status: DC | PRN

## 2016-03-02 MED ORDER — CARVEDILOL 6.25 MG PO TABS: 6.2500 mg | ORAL_TABLET | Freq: Two times a day (BID) | ORAL | 3 refills | Status: DC

## 2016-03-02 MED ORDER — SACUBITRIL-VALSARTAN 49-51 MG PO TABS: 1.0000 | ORAL_TABLET | Freq: Two times a day (BID) | ORAL | 3 refills | Status: AC

## 2016-03-02 MED ORDER — FUROSEMIDE 40 MG PO TABS: 40.0000 mg | ORAL_TABLET | Freq: Every day | ORAL | 6 refills | Status: DC | PRN

## 2016-03-02 NOTE — Patient Instructions (Addendum)
Medication Instructions:   Please take furosemide in the mornings as needed for cough when you sleep, Or shortness of breath when you walk,  Leg swelling, ABD bloating  You can take a few days in a row if needed  Metolazone only for severe symptoms of shortness of breath  Labwork:  No new labs needed  Testing/Procedures:  No further testing at this time   Follow-Up: It was a pleasure seeing you in the office today. Please call us if you have new issues that need to be addressed before your next appt.  [phone]  Your physician wants you to follow-up in: 6 months.  You will receive a reminder letter in the mail two months in advance. If you don't receive a letter, please call our office to schedule the follow-up appointment.  If you need a refill on your cardiac medications before your next appointment, please call your pharmacy.

## 2016-03-02 NOTE — Progress Notes (Signed)
On today's visit shows normal sinus rhythm with rate 67 bpm Cardiology Office Note  Date:  03/02/2016   ID:  Steve Salazar, DOB April 23, 1951, MRN [id number]  PCP:  No PCP Per Patient   Chief Complaint  Patient presents with  . other    6 month follow up. Meds reviewed by the patient verbally. "doing well."     HPI:  [name] is a pleasant 65 year old gentleman with admission to [facility] on 07/12/10 and discharged on 3/17 with shortness of breath, systolic congestive heart failure with ejection fraction 25%, nonischemic cardiomyopathy with negative stress test who presents for followup of his cardiomyopathy.    in follow-up today, he reports that he takes Lasix, carvedilol, does not have any entresto.  He did not have insurance to afford this.  Does not check his blood pressure at home.  Denies any significant shortness of breath on exertion, continues to work.  he denies any significant lower extremity edema  Significant fluid intake  Now has Medicare, able to afford his medications  Previously had abdominal bloating, this has resolved  reports his weight is stable He is interested in applying for disability   EKG On today's visit shows normal sinus rhythm with rate 67 bpm, ST and T wave abnormality in V4 through V6, 1 and aVL, left anterior fascicular block  Other past medical history April 2013 was in the emergency room last week for shortness of breath, abdominal bloating He was given Lasix IV with improvement of his symptoms Lab work reviewed with him, Potassium in the hospital for 4.0, BNP 1200,  chest x-ray mild interstitial edema  Previous Stress test showed severe cardiomyopathy with ejection fraction 25%, dilated left ventricle, low risk scan. Initial BNP on arrival to the hospital was 1342 He denied any history of drug or alcohol use, no cocaine. Previous lab work showing Total cholesterol 167, LDL 74, HDL 77.   Echo showing moderately  dilated left ventricle, ejection fraction 25 to 30%, diastolic relaxation abnormality, normal RV size and function, mild to moderate mitral regurgitation, mild to moderately dilated left atrium  PMH:   has a past medical history of Arrhythmia; CHF (congestive heart failure) (HCC); Hypertension; Hypertensive cardiomyopathy (HCC); and Renal insufficiency.  PSH:   History reviewed. No pertinent surgical history.  Current Outpatient Prescriptions  Medication Sig Dispense Refill  . bimatoprost (LUMIGAN) 0.01 % SOLN Place 1 drop into both eyes at bedtime.    . carvedilol (COREG) 6.25 MG tablet Take 1 tablet (6.25 mg total) by mouth 2 (two) times daily with a meal. 180 tablet 3  . sacubitril-valsartan (ENTRESTO) 49-51 MG Take 1 tablet by mouth 2 (two) times daily. 180 tablet 3  . tadalafil (CIALIS) 20 MG tablet Take 1 tablet (20 mg total) by mouth daily as needed for erectile dysfunction. 7 tablet 6  . tamsulosin (FLOMAX) 0.4 MG CAPS capsule Take 0.4 mg by mouth.    . furosemide (LASIX) 40 MG tablet Take 1 tablet (40 mg total) by mouth daily as needed. 30 tablet 6  . metolazone (ZAROXOLYN) 5 MG tablet Take 1 tablet (5 mg total) by mouth daily as needed. 30 tablet 6   No current facility-administered medications for this visit.      Allergies:   Review of patient's allergies indicates no known allergies.   Social History:  The patient  reports that he has quit smoking. His smoking use included Cigarettes. He has never used smokeless tobacco. He reports that he  drinks about 1.2 oz of alcohol per week . He reports that he does not use drugs.   Family History:   family history includes Heart attack in his father.    Review of Systems: Review of Systems  Constitutional: Negative.   Respiratory: Positive for cough.   Cardiovascular: Negative.   Gastrointestinal: Negative.   Musculoskeletal: Negative.   Neurological: Negative.   Psychiatric/Behavioral: Negative.   All other systems reviewed and  are negative.    PHYSICAL EXAM: VS:  BP 108/88 (BP Location: Left Arm, Patient Position: Sitting, Cuff Size: Normal)   Pulse 66   Ht 5\' 11"  (1.803 m)   Wt 223 lb 8 oz (101.4 kg)   BMI 31.17 kg/m  , BMI Body mass index is 31.17 kg/m. GEN: Well nourished, well developed, in no acute distress  HEENT: normal  Neck: no JVD, carotid bruits, or masses Cardiac: RRR; no murmurs, rubs, or gallops,no edema  Respiratory:  clear to auscultation bilaterally, normal work of breathing GI: soft, nontender, nondistended, + BS MS: no deformity or atrophy  Skin: warm and dry, no rash Neuro:  Strength and sensation are intact Psych: euthymic mood, full affect    Recent Labs: No results found for requested labs within last 8760 hours.    Lipid Panel Lab Results  Component Value Date   CHOL 208 (H) 01/26/2013   HDL 63 01/26/2013   LDLCALC 129 (H) 01/26/2013   TRIG 80 01/26/2013      Wt Readings from Last 3 Encounters:  03/02/16 223 lb 8 oz (101.4 kg)  06/01/15 228 lb 8 oz (103.6 kg)  01/07/15 225 lb 8 oz (102.3 kg)       ASSESSMENT AND PLAN:  Acute on chronic systolic CHF (congestive heart failure) (HCC) - Plan: EKG 12-Lead Appears relatively euvolemic though he does report cough when supine Long discussion concerning CHF. CHF education provided Weight is relatively stable, he does have reasonable fluid intake, not excessive We have renewed his Lasix. Recommended he take this for any coughing when supine, leg swelling, shortness of breath on exertion, abdominal bloating. Has not taken Lasix in many weeks now  Essential hypertension - Plan: EKG 12-Lead Blood pressure is well controlled on today's visit. No changes made to the medications.  Bradycardia - Plan: EKG 12-Lead Heart rate stable on low-dose carvedilol  SVT/ PSVT/ PAT - Plan: EKG 12-Lead Denies any tachycardia concerning for arrhythmia  DYSPNEA Stable chronic mild shortness of breath, some coughing when supine in  bed Encouraged him to restart his Lasix as needed for coughing  Combined arterial insufficiency and corporo-venous occlusive erectile dysfunction For his erectile dysfunction, we have renewed his Cialis   Total encounter time more than 25 minutes  Greater than 50% was spent in counseling and coordination of care with the patient   Disposition:   F/U  6 months   Orders Placed This Encounter  Procedures  . Flu Vaccine QUAD 36+ mos IM  . EKG 12-Lead     Signed, Dossie Arbourim Jackelyn Illingworth, M.D., Ph.D. 03/02/2016  [facility], [place] [phone]

## 2016-07-11 IMAGING — CR DG CHEST 2V
1 series · 2 of 2 positions shown · non-contrast
Comparison: 07/11/2009

CLINICAL DATA: Shortness of breath and wheezing for 2 weeks

EXAM:
CHEST  2 VIEW

[Series 1: dxr chest pa (or ap) and lateral · 0.14mm/px · 2 of 2 slices shown]
[im 1/2]
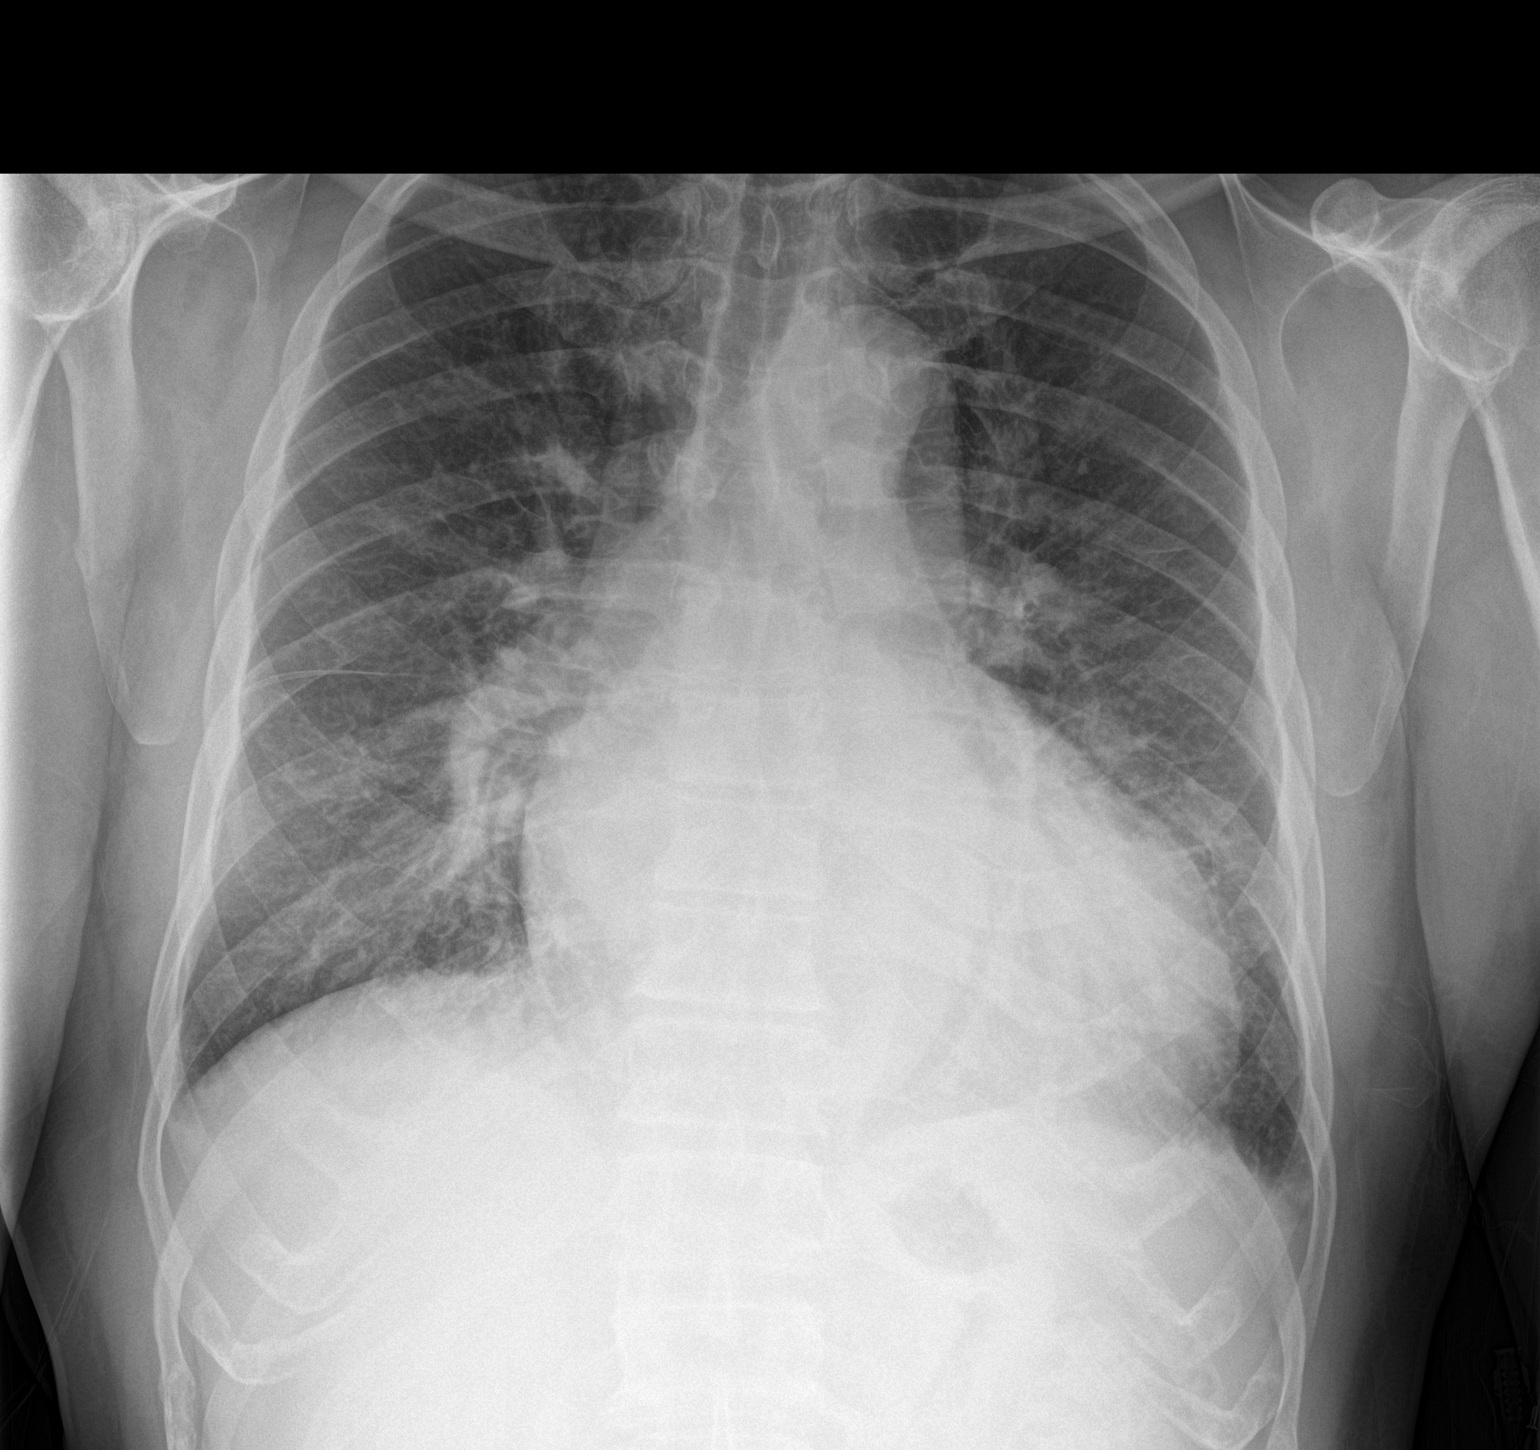
[im 2/2]
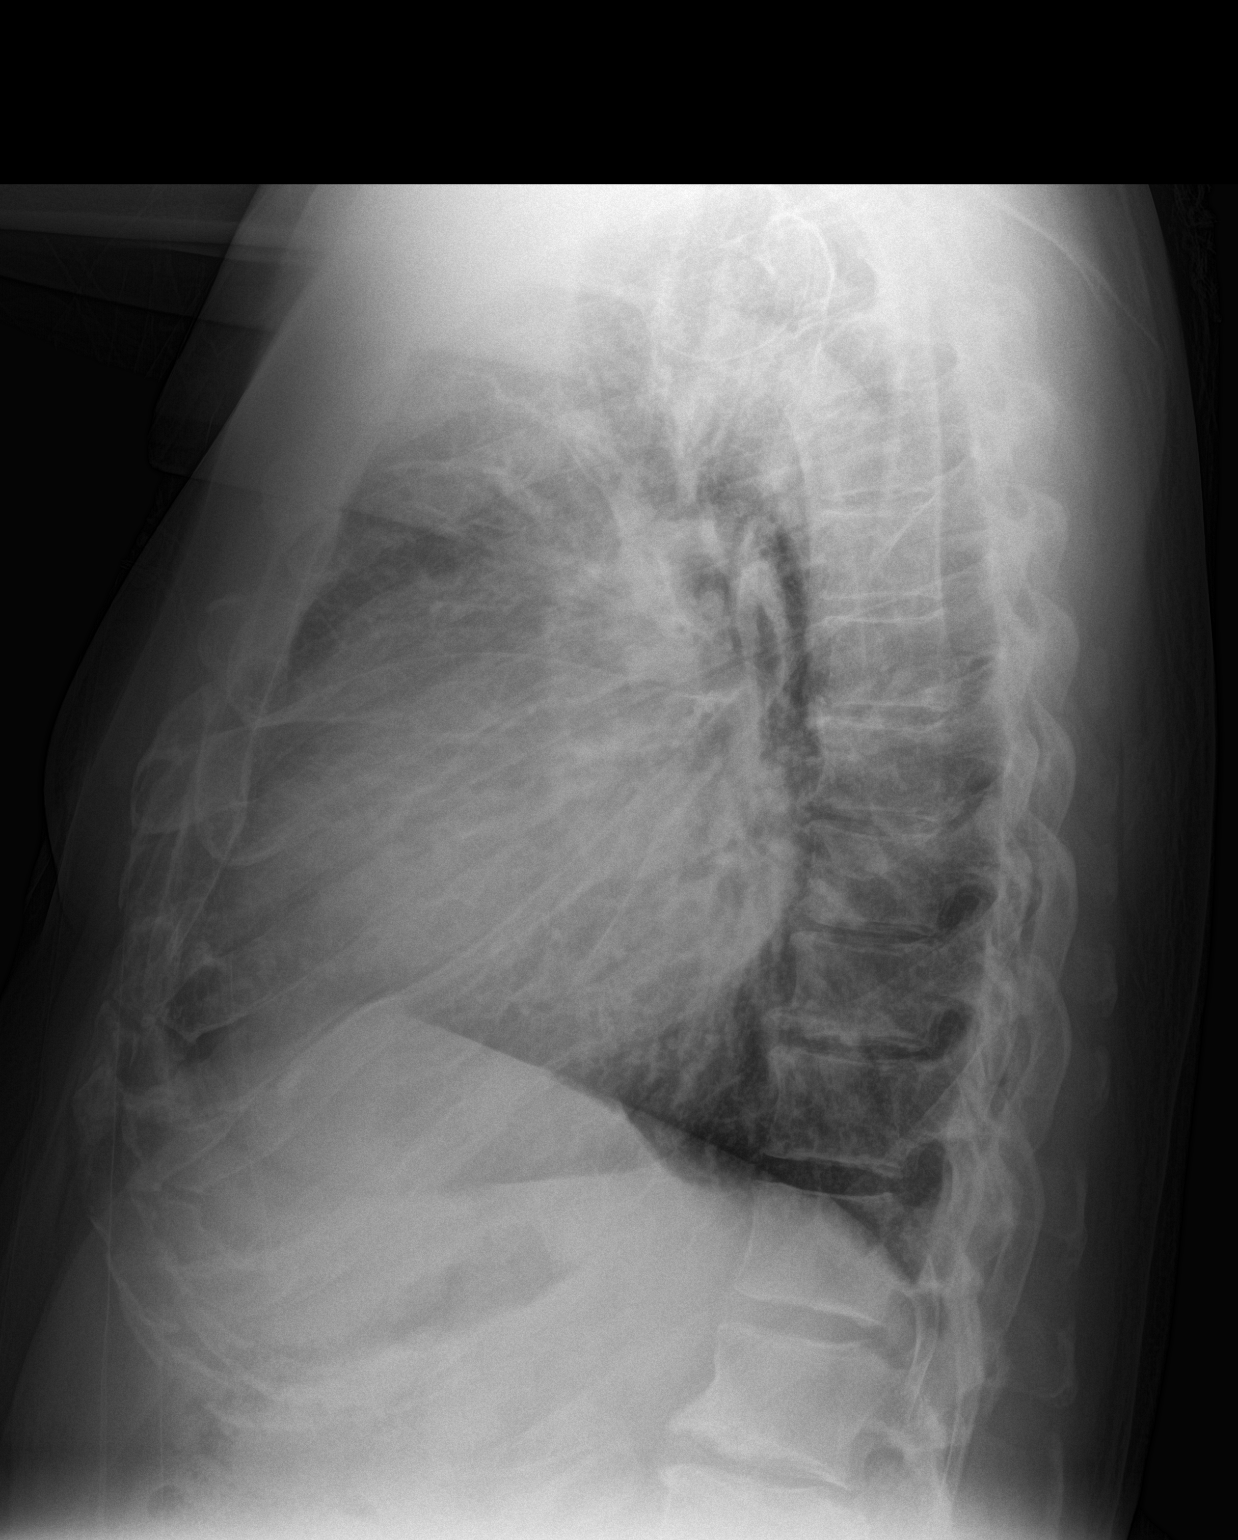

[2 of 2 positions shown; findings below may reference images not displayed]

FINDINGS: Cardiac shadow remains enlarged. The lungs are again well aerated
without focal infiltrate or sizable effusion. Mild interstitial
edema is noted but improved from the prior exam.
IMPRESSION: Mild interstitial edema.  No new focal abnormality is seen.

## 2016-07-12 DIAGNOSIS — M19012 Primary osteoarthritis, left shoulder: Secondary | ICD-10-CM | POA: Insufficient documentation

## 2016-10-28 NOTE — Progress Notes (Signed)
On today's visit shows normal sinus rhythm with rate 67 bpm Cardiology Office Note  Date:  10/30/2016   ID:  Steve Salazar, DOB 1951/01/15, MRN [id number]  PCP:  Patient, No Pcp Per   Chief Complaint  Patient presents with  . other    6 month f/u no complaints today. Pt has poison ivey bilateral arms and back. Meds reviewed verbally with pt.    HPI:  Steve Salazar is a pleasant 66 year old gentleman with admission  07/12/10 with shortness of breath,  systolic congestive heart failure with ejection fraction 25%,  nonischemic cardiomyopathy  negative stress test  who presents for followup of his cardiomyopathy.   In follow-up today he reports that he feels well, He has all of his medications including Lasix, carvedilol, entresto.    Denies any significant shortness of breath, leg swelling, chest discomfort Recent poison ivy all over his flanks and back  Continues to work  Previously had abdominal bloating, this has resolved  reports his weight is stable   EKG On today's visit shows normal sinus rhythm with rate 70 bpm, ST and T wave abnormality in  V6, 1 and aVL, left anterior fascicular block no significant change compared to previous EKGs  Other past medical history April 2013 was in the emergency room last week for shortness of breath, abdominal bloating He was given Lasix IV with improvement of his symptoms Lab work reviewed with him, Potassium in the hospital for 4.0, BNP 1200,  chest x-ray mild interstitial edema  Previous Stress test showed severe cardiomyopathy with ejection fraction 25%, dilated left ventricle, low risk scan. Initial BNP on arrival to the hospital was 1342 He denied any history of drug or alcohol use, no cocaine. Previous lab work showing Total cholesterol 167, LDL 74, HDL 77.   Echo showing moderately dilated left ventricle, ejection fraction 25 to 30%, diastolic relaxation abnormality, normal RV size and function, mild to moderate mitral  regurgitation, mild to moderately dilated left atrium  PMH:   has a past medical history of Arrhythmia; CHF (congestive heart failure) (HCC); Hypertension; Hypertensive cardiomyopathy (HCC); and Renal insufficiency.  PSH:   History reviewed. No pertinent surgical history.  Current Outpatient Prescriptions  Medication Sig Dispense Refill  . bimatoprost (LUMIGAN) 0.01 % SOLN Place 1 drop into both eyes at bedtime.    . carvedilol (COREG) 6.25 MG tablet Take 1 tablet (6.25 mg total) by mouth 2 (two) times daily with a meal. 180 tablet 3  . furosemide (LASIX) 40 MG tablet Take 1 tablet (40 mg total) by mouth daily as needed. 30 tablet 6  . metolazone (ZAROXOLYN) 5 MG tablet Take 1 tablet (5 mg total) by mouth daily as needed. 30 tablet 6  . naproxen sodium (ANAPROX) 220 MG tablet Take 220 mg by mouth as needed.    . sacubitril-valsartan (ENTRESTO) 49-51 MG Take 1 tablet by mouth 2 (two) times daily. 180 tablet 3  . tadalafil (CIALIS) 20 MG tablet Take 1 tablet (20 mg total) by mouth daily as needed for erectile dysfunction. 7 tablet 6  . tamsulosin (FLOMAX) 0.4 MG CAPS capsule Take 0.4 mg by mouth.     No current facility-administered medications for this visit.      Allergies:   Patient has no known allergies.   Social History:  The patient  reports that he has quit smoking. His smoking use included Cigarettes. He has never used smokeless tobacco. He reports that he drinks about 1.2 oz of alcohol per week .  He reports that he does not use drugs.   Family History:   family history includes Heart attack in his father.    Review of Systems: Review of Systems  Constitutional: Negative.   Respiratory: Negative.   Cardiovascular: Negative.   Gastrointestinal: Negative.   Musculoskeletal: Negative.   Neurological: Negative.   Psychiatric/Behavioral: Negative.   All other systems reviewed and are negative.    PHYSICAL EXAM: VS:  BP 106/84 (BP Location: Left Arm, Patient Position:  Sitting, Cuff Size: Normal)   Pulse 70   Ht 5\' 11"  (1.803 m)   Wt 225 lb 8 oz (102.3 kg)   BMI 31.45 kg/m  , BMI Body mass index is 31.45 kg/m. GEN: Well nourished, well developed, in no acute distress  HEENT: normal  Neck: no JVD, carotid bruits, or masses Cardiac: RRR; no murmurs, rubs, or gallops,no edema  Respiratory:  clear to auscultation bilaterally, normal work of breathing GI: soft, nontender, nondistended, + BS MS: no deformity or atrophy  Skin: warm and dry, no rash Neuro:  Strength and sensation are intact Psych: euthymic mood, full affect    Recent Labs: No results found for requested labs within last 8760 hours.    Lipid Panel Lab Results  Component Value Date   CHOL 208 (H) 01/26/2013   HDL 63 01/26/2013   LDLCALC 129 (H) 01/26/2013   TRIG 80 01/26/2013      Wt Readings from Last 3 Encounters:  10/30/16 225 lb 8 oz (102.3 kg)  03/02/16 223 lb 8 oz (101.4 kg)  06/01/15 228 lb 8 oz (103.6 kg)       ASSESSMENT AND PLAN:  Acute on chronic systolic CHF (congestive heart failure) (HCC) - Plan: EKG 12-Lead No medication changes made Continue Lasix, carvedilol, entresto at current dose He does not want additional medications  Essential hypertension - Plan: EKG 12-Lead Blood pressure is well controlled on today's visit. No changes made to the medications.  Bradycardia - Plan: EKG 12-Lead Heart rate stable on low-dose carvedilol  SVT/ PSVT/ PAT - Plan: EKG 12-Lead Denies any tachycardia concerning for arrhythmia  DYSPNEA Periodic shortness of breath, no signs of congestive heart failure  Combined arterial insufficiency and corporo-venous occlusive erectile dysfunction For his erectile dysfunction, we have renewed his generic Viagra Discussed dosing with him today   Total encounter time more than 25 minutes  Greater than 50% was spent in counseling and coordination of care with the patient   Disposition:   F/U  6 months   Orders Placed This  Encounter  Procedures  . EKG 12-Lead     Signed, Dossie Arbour, M.D., Ph.D. 10/30/2016  [facility] [place], [place] [phone]

## 2016-10-30 ENCOUNTER — Ambulatory Visit (INDEPENDENT_AMBULATORY_CARE_PROVIDER_SITE_OTHER): Payer: Medicare HMO | Admitting: Cardiovascular Disease

## 2016-10-30 ENCOUNTER — Encounter: Payer: Self-pay | Admitting: Cardiovascular Disease

## 2016-10-30 VITALS — BP 106/84 | HR 70 | Ht 71.0 in | Wt 225.5 lb

## 2016-10-30 DIAGNOSIS — I10 Essential (primary) hypertension: Secondary | ICD-10-CM | POA: Diagnosis not present

## 2016-10-30 DIAGNOSIS — I5023 Acute on chronic systolic (congestive) heart failure: Secondary | ICD-10-CM | POA: Diagnosis not present

## 2016-10-30 DIAGNOSIS — I471 Supraventricular tachycardia: Secondary | ICD-10-CM

## 2016-10-30 DIAGNOSIS — I420 Dilated cardiomyopathy: Secondary | ICD-10-CM

## 2016-10-30 MED ORDER — SILDENAFIL CITRATE 20 MG PO TABS: 20.0000 mg | ORAL_TABLET | Freq: Three times a day (TID) | ORAL | 3 refills | Status: DC | PRN

## 2016-10-30 NOTE — Patient Instructions (Addendum)
Medication Instructions:   No medication changes made  Labwork:  No new labs needed  Testing/Procedures:  We will order an echocardiogram for cardiomyopathy, shortness of breath Echocardiography is a painless test that uses sound waves to create images of your heart. It provides your doctor with information about the size and shape of your heart and how well your heart's chambers and valves are working. This procedure takes approximately one hour. There are no restrictions for this procedure.  Follow-Up: It was a pleasure seeing you in the office today. Please call us if you have new issues that need to be addressed before your next appt.  [phone]  Your physician wants you to follow-up in: 6 months.  You will receive a reminder letter in the mail two months in advance. If you don't receive a letter, please call our office to schedule the follow-up appointment.  If you need a refill on your cardiac medications before your next appointment, please call your pharmacy.    Echocardiogram An echocardiogram, or echocardiography, uses sound waves (ultrasound) to produce an image of your heart. The echocardiogram is simple, painless, obtained within a short period of time, and offers valuable information to your health care provider. The images from an echocardiogram can provide information such as:  Evidence of coronary artery disease (CAD).  Heart size.  Heart muscle function.  Heart valve function.  Aneurysm detection.  Evidence of a past heart attack.  Fluid buildup around the heart.  Heart muscle thickening.  Assess heart valve function.  Tell a health care provider about:  Any allergies you have.  All medicines you are taking, including vitamins, herbs, eye drops, creams, and over-the-counter medicines.  Any problems you or family members have had with anesthetic medicines.  Any blood disorders you have.  Any surgeries you have had.  Any medical conditions  you have.  Whether you are pregnant or may be pregnant. What happens before the procedure? No special preparation is needed. Eat and drink normally. What happens during the procedure?  In order to produce an image of your heart, gel will be applied to your chest and a wand-like tool (transducer) will be moved over your chest. The gel will help transmit the sound waves from the transducer. The sound waves will harmlessly bounce off your heart to allow the heart images to be captured in real-time motion. These images will then be recorded.  You may need an IV to receive a medicine that improves the quality of the pictures. What happens after the procedure? You may return to your normal schedule including diet, activities, and medicines, unless your health care provider tells you otherwise. This information is not intended to replace advice given to you by your health care provider. Make sure you discuss any questions you have with your health care provider. Document Released: 04/13/2000 Document Revised: 12/03/2015 Document Reviewed: 12/22/2012 Elsevier Interactive Patient Education  2017 [employer].

## 2016-11-05 ENCOUNTER — Ambulatory Visit (INDEPENDENT_AMBULATORY_CARE_PROVIDER_SITE_OTHER): Payer: Medicare HMO

## 2016-11-05 ENCOUNTER — Encounter: Payer: Self-pay | Admitting: Emergency Medicine

## 2016-11-05 ENCOUNTER — Ambulatory Visit
Admission: EM | Admit: 2016-11-05 | Discharge: 2016-11-05 | Disposition: A | Discharge status: Home / Self Care | Payer: Medicare HMO | Attending: Family Medicine | Admitting: Family Medicine

## 2016-11-05 DIAGNOSIS — I517 Cardiomegaly: Secondary | ICD-10-CM | POA: Insufficient documentation

## 2016-11-05 DIAGNOSIS — I420 Dilated cardiomyopathy: Secondary | ICD-10-CM | POA: Diagnosis not present

## 2016-11-05 DIAGNOSIS — N529 Male erectile dysfunction, unspecified: Secondary | ICD-10-CM | POA: Insufficient documentation

## 2016-11-05 DIAGNOSIS — I5023 Acute on chronic systolic (congestive) heart failure: Secondary | ICD-10-CM | POA: Insufficient documentation

## 2016-11-05 DIAGNOSIS — Z8249 Family history of ischemic heart disease and other diseases of the circulatory system: Secondary | ICD-10-CM | POA: Insufficient documentation

## 2016-11-05 DIAGNOSIS — I471 Supraventricular tachycardia: Secondary | ICD-10-CM | POA: Insufficient documentation

## 2016-11-05 DIAGNOSIS — R42 Dizziness and giddiness: Secondary | ICD-10-CM

## 2016-11-05 DIAGNOSIS — Z87891 Personal history of nicotine dependence: Secondary | ICD-10-CM | POA: Insufficient documentation

## 2016-11-05 DIAGNOSIS — R001 Bradycardia, unspecified: Secondary | ICD-10-CM | POA: Diagnosis not present

## 2016-11-05 DIAGNOSIS — I110 Hypertensive heart disease with heart failure: Secondary | ICD-10-CM | POA: Diagnosis not present

## 2016-11-05 DIAGNOSIS — N289 Disorder of kidney and ureter, unspecified: Secondary | ICD-10-CM

## 2016-11-05 DIAGNOSIS — I444 Left anterior fascicular block: Secondary | ICD-10-CM | POA: Insufficient documentation

## 2016-11-05 DIAGNOSIS — I4891 Unspecified atrial fibrillation: Secondary | ICD-10-CM | POA: Insufficient documentation

## 2016-11-05 DIAGNOSIS — R55 Syncope and collapse: Secondary | ICD-10-CM

## 2016-11-05 LAB — CBC WITH DIFFERENTIAL/PLATELET
BASOS ABS: 0 10*3/uL (ref 0–0.1)
BASOS PCT: 1 %
Eosinophils Absolute: 0.1 10*3/uL (ref 0–0.7)
Eosinophils Relative: 1 %
HEMATOCRIT: 40.7 % (ref 40.0–52.0)
HEMOGLOBIN: 13.2 g/dL (ref 13.0–18.0)
LYMPHS PCT: 19 %
Lymphs Abs: 1.1 10*3/uL (ref 1.0–3.6)
MCH: 28.4 pg (ref 26.0–34.0)
MCHC: 32.5 g/dL (ref 32.0–36.0)
MCV: 87.3 fL (ref 80.0–100.0)
MONOS PCT: 8 %
Monocytes Absolute: 0.5 10*3/uL (ref 0.2–1.0)
NEUTROS ABS: 4.3 10*3/uL (ref 1.4–6.5)
Neutrophils Relative %: 71 %
Platelets: 198 10*3/uL (ref 150–440)
RBC: 4.65 MIL/uL (ref 4.40–5.90)
RDW: 15.2 % — ABNORMAL HIGH (ref 11.5–14.5)
WBC: 6 10*3/uL (ref 3.8–10.6)

## 2016-11-05 LAB — BASIC METABOLIC PANEL
ANION GAP: 8 (ref 5–15)
BUN: 18 mg/dL (ref 6–20)
CALCIUM: 9.2 mg/dL (ref 8.9–10.3)
CO2: 25 mmol/L (ref 22–32)
Chloride: 104 mmol/L (ref 101–111)
Creatinine, Ser: 1.68 mg/dL — ABNORMAL HIGH (ref 0.61–1.24)
GFR, EST AFRICAN AMERICAN: 47 mL/min — AB (ref >60)
GFR, EST NON AFRICAN AMERICAN: 41 mL/min — AB (ref >60)
GLUCOSE: 126 mg/dL — AB (ref 65–99)
POTASSIUM: 3.6 mmol/L (ref 3.5–5.1)
Sodium: 137 mmol/L (ref 135–145)

## 2016-11-05 NOTE — Discharge Instructions (Signed)
Increase fluids slowly Follow up with Primary physician in the next week Go to Emergency Department if symptoms worsen

## 2016-11-05 NOTE — ED Triage Notes (Signed)
Patient states he has had tow incidents of dizziness and blurred vision today.

## 2016-11-05 NOTE — ED Provider Notes (Signed)
[facility]    CSN: [id number] Arrival date & time: 11/05/16  1741     History   Chief Complaint Chief Complaint  Patient presents with  . Dizziness    HPI Steve Salazar is a 66 y.o. male.   66 yo male with a c/o 2 intermittent episodes of dizziness and lightheadedness today and one episode of blurred vision all of which resolved spontaneously. Currently denies any symptoms and states feels fine. Episodes of lightheadedness occurred right after position change from sitting to standing. Denies any chest pains, palpitations, shortness of breath, fevers, chills, numbness/tingling, speech or swallowing problems, or one-sided weakness.States recently has been working some outside in the heat and not sure if he's been taking in enough fluids.    The history is provided by the patient.  Dizziness    Past Medical History:  Diagnosis Date  . Arrhythmia    SVT verus A-fib  . CHF (congestive heart failure) (HCC)   . Hypertension   . Hypertensive cardiomyopathy (HCC)    EF 35%  . Renal insufficiency    Mild chronic    Patient Active Problem List   Diagnosis Date Noted  . Erectile dysfunction 06/01/2015  . Acute on chronic systolic CHF (congestive heart failure) (HCC) 08/12/2014  . Essential hypertension 01/26/2013  . Bradycardia 12/07/2011  . 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 SVT/ PSVT/ PAT 07/22/2009  . DYSPNEA 07/22/2009    History reviewed. No pertinent surgical history.     Home Medications    Prior to Admission medications   Medication Sig Start Date End Date Taking? Authorizing Provider  carvedilol (COREG) 6.25 MG tablet Take 1 tablet (6.25 mg total) by mouth 2 (two) times daily with a meal. 03/02/16  Yes Gollan, Tollie Pizza, MD  furosemide (LASIX) 40 MG tablet Take 1 tablet (40 mg total) by mouth daily as needed. 03/02/16  Yes Gollan, Tollie Pizza, MD  metolazone (ZAROXOLYN) 5 MG tablet Take 1 tablet (5 mg total) by mouth daily as  needed. 03/02/16  Yes Gollan, Tollie Pizza, MD  sacubitril-valsartan (ENTRESTO) 49-51 MG Take 1 tablet by mouth 2 (two) times daily. 03/02/16 03/02/17 Yes Gollan, Tollie Pizza, MD  tamsulosin (FLOMAX) 0.4 MG CAPS capsule Take 0.4 mg by mouth.   Yes [provider]  bimatoprost (LUMIGAN) 0.01 % SOLN Place 1 drop into both eyes at bedtime.    [provider]  naproxen sodium (ANAPROX) 220 MG tablet Take 220 mg by mouth as needed.    [provider]  sildenafil (REVATIO) 20 MG tablet Take 1 tablet (20 mg total) by mouth 3 (three) times daily as needed. Patient not taking: Reported on 11/05/2016 10/30/16   [name], MD  tadalafil (CIALIS) 20 MG tablet Take 1 tablet (20 mg total) by mouth daily as needed for erectile dysfunction. 06/01/15   [name], MD    Family History Family History  Problem Relation Age of Onset  . Heart attack Father        MI    Social History Social History  Substance Use Topics  . Smoking status: Former Smoker    Types: Cigarettes  . Smokeless tobacco: Never Used     Comment: Tobaco use-no  . Alcohol use 1.2 oz/week    2 Cans of beer per week     Comment: ocassional     Allergies   Patient has no known allergies.   Review of Systems Review of Systems  Neurological: Positive for  dizziness.     Physical Exam Triage Vital Signs ED Triage Vitals  Enc Vitals Group     BP 11/05/16 1755 112/73     Pulse Rate 11/05/16 1755 81     Resp --      Temp 11/05/16 1755 98.3 F (36.8 C)     Temp Source 11/05/16 1755 Oral     SpO2 11/05/16 1755 98 %     Weight 11/05/16 1755 225 lb (102.1 kg)     Height 11/05/16 1755 5\' 11"  (1.803 m)     Head Circumference --      Peak Flow --      Pain Score 11/05/16 1922 0     Pain Loc --      Pain Edu? --      Excl. in GC? --    No data found.   Updated Vital Signs BP 127/79 (BP Location: Right Arm)   Pulse 71   Temp 98 F (36.7 C) (Oral)   Ht 5\' 11"  (1.803 m)   Wt 225 lb (102.1  kg)   SpO2 96%   BMI 31.38 kg/m   Visual Acuity Right Eye Distance:   Left Eye Distance:   Bilateral Distance:    Right Eye Near:   Left Eye Near:    Bilateral Near:     Physical Exam  Constitutional: He is oriented to person, place, and time. He appears well-developed and well-nourished. No distress.  HENT:  Head: Normocephalic and atraumatic.  Cardiovascular: Normal rate, regular rhythm, normal heart sounds and intact distal pulses.   No murmur heard. Pulmonary/Chest: Effort normal and breath sounds normal. No respiratory distress. He has no wheezes. He has no rales.  Neurological: He is alert and oriented to person, place, and time.  Skin: No rash noted. He is not diaphoretic.  Nursing note and vitals reviewed.    UC Treatments / Results  Labs (all labs ordered are listed, but only abnormal results are displayed) Labs Reviewed  BASIC METABOLIC PANEL - Abnormal; Notable for the following:       Result Value   Glucose, Bld 126 (*)    Creatinine, Ser 1.68 (*)    GFR calc non Af Amer 41 (*)    GFR calc Af Amer 47 (*)    All other components within normal limits  CBC WITH DIFFERENTIAL/PLATELET - Abnormal; Notable for the following:    RDW 15.2 (*)    All other components within normal limits    EKG  EKG Interpretation None       Radiology Dg Chest 2 View  Result Date: 11/05/2016 CLINICAL DATA:  Weakness and dizziness EXAM: CHEST  2 VIEW COMPARISON:  07/27/2014 FINDINGS: Mild cardiomegaly. No focal pulmonary infiltrate or effusion. No pneumothorax. Mild degenerative changes of the spine. IMPRESSION: Mild cardiomegaly.  No edema or infiltrate. Electronically Signed   By: Jasmine Pang M.D.   On: 11/05/2016 19:00    Procedures ED EKG Date/Time: 11/05/2016 9:24 PM Performed by: Payton Mccallum Authorized by: Payton Mccallum   ECG reviewed by ED Physician in the absence of a cardiologist: yes   Previous ECG:    Previous ECG:  Compared to current   Similarity:  No  change Interpretation:    Interpretation: normal   Rate:    ECG rate assessment: normal   Rhythm:    Rhythm: sinus rhythm   Ectopy:    Ectopy: none   QRS:    QRS axis:  Normal   QRS intervals:  Normal Conduction:    Conduction: abnormal     Abnormal conduction: LAFB   ST segments:    ST segments:  Normal T waves:    T waves: normal     (including critical care time)  Medications Ordered in UC Medications - No data to display   Initial Impression / Assessment and Plan / UC Course  I have reviewed the triage vital signs and the nursing notes.  Pertinent labs & imaging results that were available during my care of the patient were reviewed by me and considered in my medical decision making (see chart for details).       Final Clinical Impressions(s) / UC Diagnoses   Final diagnoses:  Lightheadedness  Renal insufficiency    New Prescriptions Discharge Medication List as of 11/05/2016  7:20 PM     1. Labs/x-ray/ekg results and diagnosis reviewed with patient 2. Recommend supportive treatment with rest, increased fluids 3. Continue home medications 4. Follow-up with PCP this week for recheck and recheck labs  5. To ED if symptoms recur and/or worsen   Payton Mccallum, MD 11/05/16 2132

## 2016-11-19 ENCOUNTER — Other Ambulatory Visit: Payer: Self-pay | Admitting: Cardiovascular Disease

## 2016-11-19 ENCOUNTER — Other Ambulatory Visit: Payer: Self-pay

## 2016-11-19 ENCOUNTER — Ambulatory Visit (INDEPENDENT_AMBULATORY_CARE_PROVIDER_SITE_OTHER): Payer: Medicare HMO

## 2016-11-19 DIAGNOSIS — I420 Dilated cardiomyopathy: Secondary | ICD-10-CM

## 2016-11-19 DIAGNOSIS — I5023 Acute on chronic systolic (congestive) heart failure: Secondary | ICD-10-CM

## 2016-11-20 ENCOUNTER — Other Ambulatory Visit: Payer: Self-pay

## 2016-11-20 DIAGNOSIS — I471 Supraventricular tachycardia: Secondary | ICD-10-CM

## 2016-11-26 ENCOUNTER — Encounter (INDEPENDENT_AMBULATORY_CARE_PROVIDER_SITE_OTHER): Payer: Medicare HMO

## 2016-11-26 DIAGNOSIS — I471 Supraventricular tachycardia: Secondary | ICD-10-CM | POA: Diagnosis not present

## 2017-01-23 ENCOUNTER — Telehealth: Payer: Self-pay | Admitting: Cardiovascular Disease

## 2017-01-23 NOTE — Telephone Encounter (Signed)
Pt sister calling stating the last month patient more SOB  Feels like he is going to pass out In morning very tired  She is just concerned She knows kidney are not working as they should be She thinks it could be something else with Heart  Would like a call back

## 2017-01-23 NOTE — Telephone Encounter (Signed)
Pt sister returning our call

## 2017-01-23 NOTE — Telephone Encounter (Signed)
Left voicemail message to call back  

## 2017-01-23 NOTE — Telephone Encounter (Signed)
Spoke with patients sister who is a Child psychotherapist and reviewed his cardiac work up that we have done on the patient so far. Patient had echocardiogram and heart monitor and is on medications. She asked about kidney function and advised that she check with patients primary care provider regarding other questions about his care. She was very appreciative for the review of testing and medications with no further questions at this time.

## 2017-03-20 ENCOUNTER — Other Ambulatory Visit: Payer: Self-pay | Admitting: Cardiovascular Disease

## 2017-04-13 ENCOUNTER — Other Ambulatory Visit: Payer: Self-pay | Admitting: Cardiovascular Disease

## 2017-07-10 DIAGNOSIS — N189 Chronic kidney disease, unspecified: Secondary | ICD-10-CM | POA: Insufficient documentation

## 2017-07-10 DIAGNOSIS — R972 Elevated prostate specific antigen [PSA]: Secondary | ICD-10-CM | POA: Diagnosis not present

## 2017-07-24 DIAGNOSIS — K769 Liver disease, unspecified: Secondary | ICD-10-CM | POA: Diagnosis not present

## 2017-07-24 DIAGNOSIS — R109 Unspecified abdominal pain: Secondary | ICD-10-CM | POA: Diagnosis not present

## 2017-07-24 DIAGNOSIS — K4090 Unilateral inguinal hernia, without obstruction or gangrene, not specified as recurrent: Secondary | ICD-10-CM | POA: Diagnosis not present

## 2017-07-24 DIAGNOSIS — R609 Edema, unspecified: Secondary | ICD-10-CM | POA: Diagnosis not present

## 2017-08-09 DIAGNOSIS — N401 Enlarged prostate with lower urinary tract symptoms: Secondary | ICD-10-CM | POA: Diagnosis not present

## 2017-08-09 DIAGNOSIS — R972 Elevated prostate specific antigen [PSA]: Secondary | ICD-10-CM | POA: Diagnosis not present

## 2017-08-09 DIAGNOSIS — R350 Frequency of micturition: Secondary | ICD-10-CM | POA: Diagnosis not present

## 2017-08-09 DIAGNOSIS — K4090 Unilateral inguinal hernia, without obstruction or gangrene, not specified as recurrent: Secondary | ICD-10-CM | POA: Diagnosis not present

## 2017-08-09 DIAGNOSIS — R3915 Urgency of urination: Secondary | ICD-10-CM | POA: Diagnosis not present

## 2017-08-09 DIAGNOSIS — R3911 Hesitancy of micturition: Secondary | ICD-10-CM | POA: Diagnosis not present

## 2017-08-09 DIAGNOSIS — K529 Noninfective gastroenteritis and colitis, unspecified: Secondary | ICD-10-CM | POA: Diagnosis not present

## 2017-08-31 NOTE — Progress Notes (Signed)
Cardiology Office Note  Date:  09/02/2017   ID:  Steve Salazar, DOB January 21, 1951, MRN [id number]  PCP:  Patient, No Pcp Per   Chief Complaint  Patient presents with  . other    6 month f/u c/o abd pain (hernia), elevated BP and dizziness. Meds reviewed verbally with pt.    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   And follow-up reports having Pain in his right groin from suspected hernia scheduled to see surgery tomorrow at [facility]  For further evaluation Unable to walk, lift anything heavy secondary to discomfort   Typically blood pressure runs low at home,  running high today compliant with hisLasix, carvedilol, entresto.    Denies any significant shortness of breath, leg swelling, chest discomfort  Was initially retired and went back to work Wife reports that he periodically calls reporting fatigue and shortness of breath symptoms  EKG personally reviewed by myself on todays visit Shows normal sinus rhythm rate 68 bpm T-wave abnormality V6 1 and aVL No change compared to June 2018  Other past medical history April 2013 was in the emergency room last week for shortness of breath, abdominal bloating He was given Lasix IV with improvement of his symptoms Lab work reviewed with him, Potassium in the hospital for 4.0, BNP 1200,  chest x-ray mild interstitial edema  Previous Stress test showed severe cardiomyopathy with ejection fraction 25%, dilated left ventricle, low risk scan. Initial BNP on arrival to the hospital was 1342 He denied any history of drug or alcohol use, no cocaine. Previous lab work showing Total cholesterol 167, LDL 74, HDL 77.   Echo showing moderately dilated left ventricle, ejection fraction 25 to 30%, diastolic relaxation  abnormality, normal RV size and function, mild to moderate mitral regurgitation, mild to moderately dilated left atrium  PMH:   has a past medical history of Arrhythmia, CHF (congestive heart failure) (HCC), Hypertension, Hypertensive cardiomyopathy (HCC), and Renal insufficiency.  PSH:   History reviewed. No pertinent surgical history.  Current Outpatient Medications  Medication Sig Dispense Refill  . bimatoprost (LUMIGAN) 0.01 % SOLN Place 1 drop into both eyes at bedtime.    . carvedilol (COREG) 6.25 MG tablet Take 1 tablet (6.25 mg total) by mouth 2 (two) times daily with a meal. 180 tablet 3  . ENTRESTO 49-51 MG take 1 tablet by mouth twice a day 180 tablet 3  . furosemide (LASIX) 40 MG tablet Take 1 tablet (40 mg total) by mouth daily as needed. 30 tablet 6  . metolazone (ZAROXOLYN) 5 MG tablet Take 1 tablet (5 mg total) by mouth daily as needed. 30 tablet 6  . naproxen sodium (ANAPROX) 220 MG tablet Take 220 mg by mouth as needed.    . sildenafil (REVATIO) 20 MG tablet Take 1 tablet (20 mg total) by mouth 3 (three) times daily as needed. 90 tablet 3  . tamsulosin (FLOMAX) 0.4 MG CAPS capsule Take 0.4 mg by mouth.     No current facility-administered medications for this visit.      Allergies:   Patient has no known allergies.   Social History:  The patient  reports that he has quit smoking. His smoking use included cigarettes. He has never used smokeless tobacco. He reports that he drinks about 1.2 oz of alcohol per  week. He reports that he does not use drugs.   Family History:   family history includes Heart attack in his father.    Review of Systems: Review of Systems  Constitutional: Negative.   Respiratory: Negative.   Cardiovascular: Negative.   Gastrointestinal: Negative.        Right groin pain  Musculoskeletal: Negative.   Neurological: Negative.   Psychiatric/Behavioral: Negative.   All other systems reviewed and are negative.    PHYSICAL EXAM: VS:  BP (!)  142/94 (BP Location: Right Arm, Patient Position: Sitting, Cuff Size: Large)   Pulse 68   Ht 5\' 11"  (1.803 m)   Wt 219 lb 12 oz (99.7 kg)   BMI 30.65 kg/m  , BMI Body mass index is 30.65 kg/m. GEN: Well nourished, well developed, in no acute distress  HEENT: normal  Neck: no JVD, carotid bruits, or masses Cardiac: RRR; no murmurs, rubs, or gallops,no edema  Respiratory:  clear to auscultation bilaterally, normal work of breathing GI: soft, nontender, nondistended, + BS MS: no deformity or atrophy  Skin: warm and dry, no rash Neuro:  Strength and sensation are intact Psych: euthymic mood, full affect   Recent Labs: 11/05/2016: BUN 18; Creatinine, Ser 1.68; Hemoglobin 13.2; Platelets 198; Potassium 3.6; Sodium 137    Lipid Panel Lab Results  Component Value Date   CHOL 208 (H) 01/26/2013   HDL 63 01/26/2013   LDLCALC 129 (H) 01/26/2013   TRIG 80 01/26/2013      Wt Readings from Last 3 Encounters:  09/02/17 219 lb 12 oz (99.7 kg)  11/05/16 225 lb (102.1 kg)  10/30/16 225 lb 8 oz (102.3 kg)       ASSESSMENT AND PLAN:  Acute on chronic systolic CHF (congestive heart failure) (HCC) - Plan: EKG 12-Lead Continue Lasix, carvedilol, entresto at current dose Typically blood pressure runs low, he will check blood pressure over the next 2 weeks and call our office with numbers Blood pressure could be elevated secondary to pain right groin If blood pressure does continue to run high we could add Aldactone, possibly increase entersto  Dilated cardiomyopathy Presumed to be nonischemic by her stress testing showing no ischemia No prior cardiac catheterization Medications as above  Essential hypertension - Plan: EKG 12-Lead As above, typically runs low but he will calls with numbers given they are running high today  Bradycardia - Plan: EKG 12-Lead Heart rate stable on low-dose carvedilol No changes made  SVT/ PSVT/ PAT - Plan: EKG 12-Lead Denies any tachycardia concerning  for arrhythmia stable  DYSPNEA Periodic shortness of breath, no signs of congestive heart failure previously applied for disability but was denied now back at work Appears euvolemic  Combined arterial insufficiency and corporo-venous occlusive erectile dysfunction  generic Viagra This can be managed by primary care   Total encounter time more than 25 minutes  Greater than 50% was spent in counseling and coordination of care with the patient   Disposition:   F/U  6 months   Orders Placed This Encounter  Procedures  . EKG 12-Lead     Signed, Dossie Arbour, M.D., Ph.D. 09/02/2017  [facility] [place], [place] [phone]

## 2017-09-02 ENCOUNTER — Ambulatory Visit (INDEPENDENT_AMBULATORY_CARE_PROVIDER_SITE_OTHER): Payer: Medicare HMO | Admitting: Cardiovascular Disease

## 2017-09-02 ENCOUNTER — Encounter: Payer: Self-pay | Admitting: Cardiovascular Disease

## 2017-09-02 VITALS — BP 142/94 | HR 68 | Ht 71.0 in | Wt 219.8 lb

## 2017-09-02 DIAGNOSIS — I471 Supraventricular tachycardia: Secondary | ICD-10-CM

## 2017-09-02 DIAGNOSIS — I5022 Chronic systolic (congestive) heart failure: Secondary | ICD-10-CM | POA: Diagnosis not present

## 2017-09-02 DIAGNOSIS — I10 Essential (primary) hypertension: Secondary | ICD-10-CM

## 2017-09-02 DIAGNOSIS — I420 Dilated cardiomyopathy: Secondary | ICD-10-CM | POA: Diagnosis not present

## 2017-09-02 MED ORDER — CARVEDILOL 6.25 MG PO TABS: 6.2500 mg | ORAL_TABLET | Freq: Two times a day (BID) | ORAL | 3 refills | Status: DC

## 2017-09-02 NOTE — Patient Instructions (Addendum)
Monitor blood pressure and call the office with numbers   Medication Instructions:   No medication changes made  Labwork:  No new labs needed  Testing/Procedures:  No further testing at this time   Follow-Up: It was a pleasure seeing you in the office today. Please call us if you have new issues that need to be addressed before your next appt.  [phone]  Your physician wants you to follow-up in: 6 months.  You will receive a reminder letter in the mail two months in advance. If you don't receive a letter, please call our office to schedule the follow-up appointment.  If you need a refill on your cardiac medications before your next appointment, please call your pharmacy.  For educational health videos Log in to : [email or web address] Or : [email or web address], password : [email or web address]

## 2017-09-03 DIAGNOSIS — K4090 Unilateral inguinal hernia, without obstruction or gangrene, not specified as recurrent: Secondary | ICD-10-CM | POA: Diagnosis not present

## 2017-09-20 ENCOUNTER — Telehealth: Payer: Self-pay | Admitting: Cardiovascular Disease

## 2017-09-20 NOTE — Telephone Encounter (Signed)
Pt states he has an appt in [place]  To schedule hernia surgery. Pt needs clearance. I did inform pt that his surgeon will need to fax a request for clearance.

## 2017-09-20 NOTE — Telephone Encounter (Signed)
No answer. No voicemail picked up. Unable to leave a message.

## 2017-09-20 NOTE — Telephone Encounter (Signed)
Patient has appointment on Tuesday with surgeon in [place] for inguinal hernia surgery evaluation. Advised patient they would need to fax [place] a clearance. I offered to provide our fax number but he said they should have it. He was appreciative.

## 2017-09-24 DIAGNOSIS — K4090 Unilateral inguinal hernia, without obstruction or gangrene, not specified as recurrent: Secondary | ICD-10-CM | POA: Diagnosis not present

## 2017-10-03 DIAGNOSIS — K6389 Other specified diseases of intestine: Secondary | ICD-10-CM | POA: Diagnosis not present

## 2017-10-03 DIAGNOSIS — A048 Other specified bacterial intestinal infections: Secondary | ICD-10-CM | POA: Diagnosis not present

## 2017-10-03 DIAGNOSIS — K529 Noninfective gastroenteritis and colitis, unspecified: Secondary | ICD-10-CM | POA: Diagnosis not present

## 2017-10-09 DIAGNOSIS — K5730 Diverticulosis of large intestine without perforation or abscess without bleeding: Secondary | ICD-10-CM | POA: Diagnosis not present

## 2017-10-09 DIAGNOSIS — K6389 Other specified diseases of intestine: Secondary | ICD-10-CM | POA: Diagnosis not present

## 2017-10-09 DIAGNOSIS — R609 Edema, unspecified: Secondary | ICD-10-CM | POA: Diagnosis not present

## 2017-10-09 DIAGNOSIS — K769 Liver disease, unspecified: Secondary | ICD-10-CM | POA: Diagnosis not present

## 2017-10-19 DIAGNOSIS — I517 Cardiomegaly: Secondary | ICD-10-CM | POA: Diagnosis not present

## 2017-10-19 DIAGNOSIS — J90 Pleural effusion, not elsewhere classified: Secondary | ICD-10-CM | POA: Diagnosis not present

## 2017-10-19 DIAGNOSIS — K769 Liver disease, unspecified: Secondary | ICD-10-CM | POA: Diagnosis not present

## 2017-10-26 ENCOUNTER — Other Ambulatory Visit: Payer: Self-pay | Admitting: Cardiovascular Disease

## 2017-11-26 ENCOUNTER — Other Ambulatory Visit: Payer: Self-pay | Admitting: Cardiovascular Disease

## 2017-11-27 NOTE — Telephone Encounter (Signed)
Please review for refill Revatio

## 2017-11-27 NOTE — Telephone Encounter (Signed)
Attempted to contact patient regarding Revatio but was disconnected then tried to contact again went straight to voice mail unable to leave message due to no voice mail set up.

## 2017-11-27 NOTE — Telephone Encounter (Signed)
Looks like it was refilled on 10/28/17. Can we call the patient and see if he picked up the medication and let him know a refill was already sent if he still needs it.

## 2018-01-09 DIAGNOSIS — I429 Cardiomyopathy, unspecified: Secondary | ICD-10-CM | POA: Diagnosis not present

## 2018-01-09 DIAGNOSIS — N529 Male erectile dysfunction, unspecified: Secondary | ICD-10-CM | POA: Diagnosis not present

## 2018-01-09 DIAGNOSIS — K4090 Unilateral inguinal hernia, without obstruction or gangrene, not specified as recurrent: Secondary | ICD-10-CM | POA: Diagnosis not present

## 2018-04-06 ENCOUNTER — Other Ambulatory Visit: Payer: Self-pay | Admitting: Cardiovascular Disease

## 2018-06-12 ENCOUNTER — Telehealth: Payer: Self-pay

## 2018-06-12 NOTE — Telephone Encounter (Signed)
Pt appears on [facility] Quality Report for [facility] but has never been seen in the office.  I called the pt to confirm his PCP, and he confirmed that he sees Dr. [name] at [facility].  VDM (DD)

## 2018-09-02 ENCOUNTER — Telehealth: Payer: Self-pay | Admitting: Cardiovascular Disease

## 2018-09-02 NOTE — Telephone Encounter (Signed)
Pt c/o Shortness Of Breath: STAT if SOB developed within the last 24 hours or pt is noticeably SOB on the phone  1. Are you currently SOB (can you hear that pt is SOB on the phone)? No   2. How long have you been experiencing SOB? Yesterday   3. Are you SOB when sitting or when up moving around? Only when wearing ppe mask at work   4. Are you currently experiencing any other symptoms? Gets hot

## 2018-09-02 NOTE — Telephone Encounter (Signed)
I spoke with [name] (ok per [facility]). 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 He works 8 hour shifts and his company has required that they wear masks for PPE due to COVID.   Per [name], the patient was SOB yesterday and today while wearing the mask, but he is ok once he takes it off. She was afraid this could effect his heart. I advised he should be ok as long he will try to take slow deep breaths in through his nose and out of his mouth. I advised we can appreciate how he feels wearing the mask, but with it being a requirement for his company and highly favored in the general public during the current COVID pandemic, there is not much else we can do.  [name] will advise the patient of practicing relaxed breathing.  He is currently overdue for follow up with Dr. Mariah Milling. I advised we can set him up for an e-visit as we do not know when our office will re-open for in-office visits. [name] will speak with the patient. I have asked that they call back to schedule.

## 2018-09-24 ENCOUNTER — Other Ambulatory Visit: Payer: Self-pay | Admitting: Cardiovascular Disease

## 2018-10-21 IMAGING — CR DG CHEST 2V
2 series · 2 of 2 positions shown · non-contrast
Comparison: 07/27/2014

CLINICAL DATA: Weakness and dizziness

EXAM:
CHEST  2 VIEW

[chest pa]
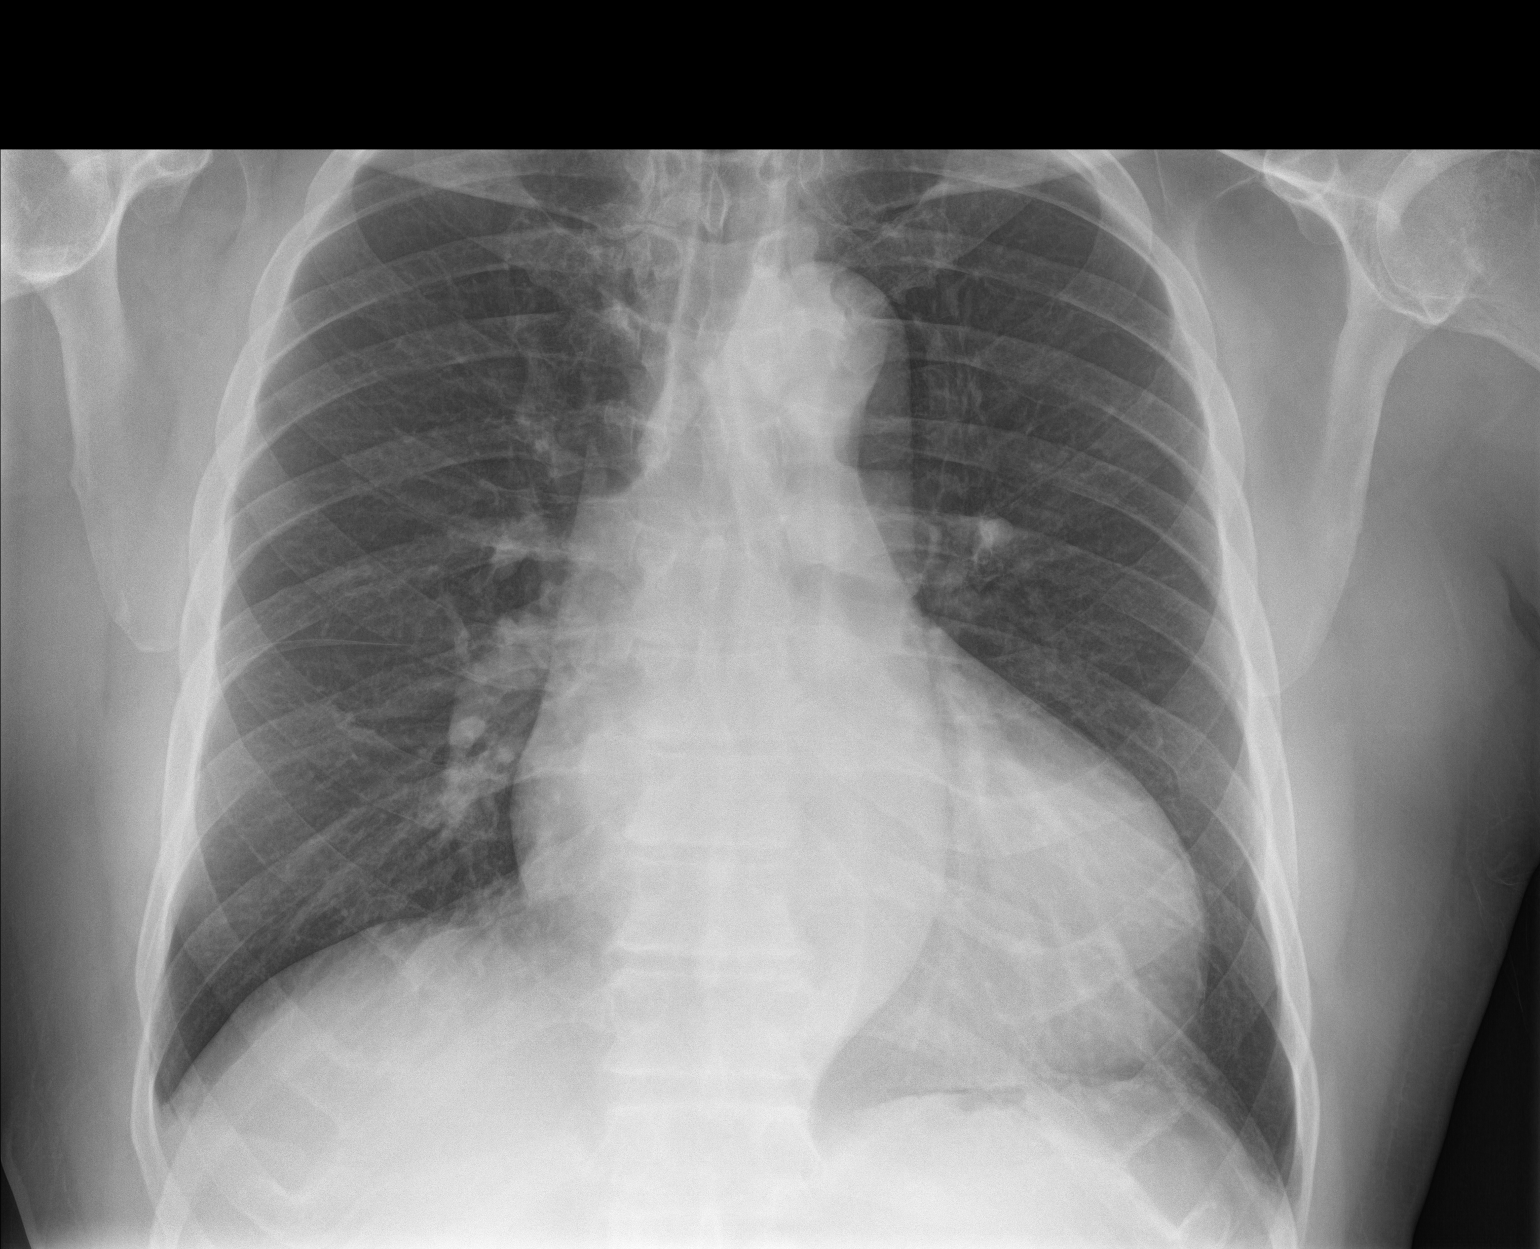

[chest lat]
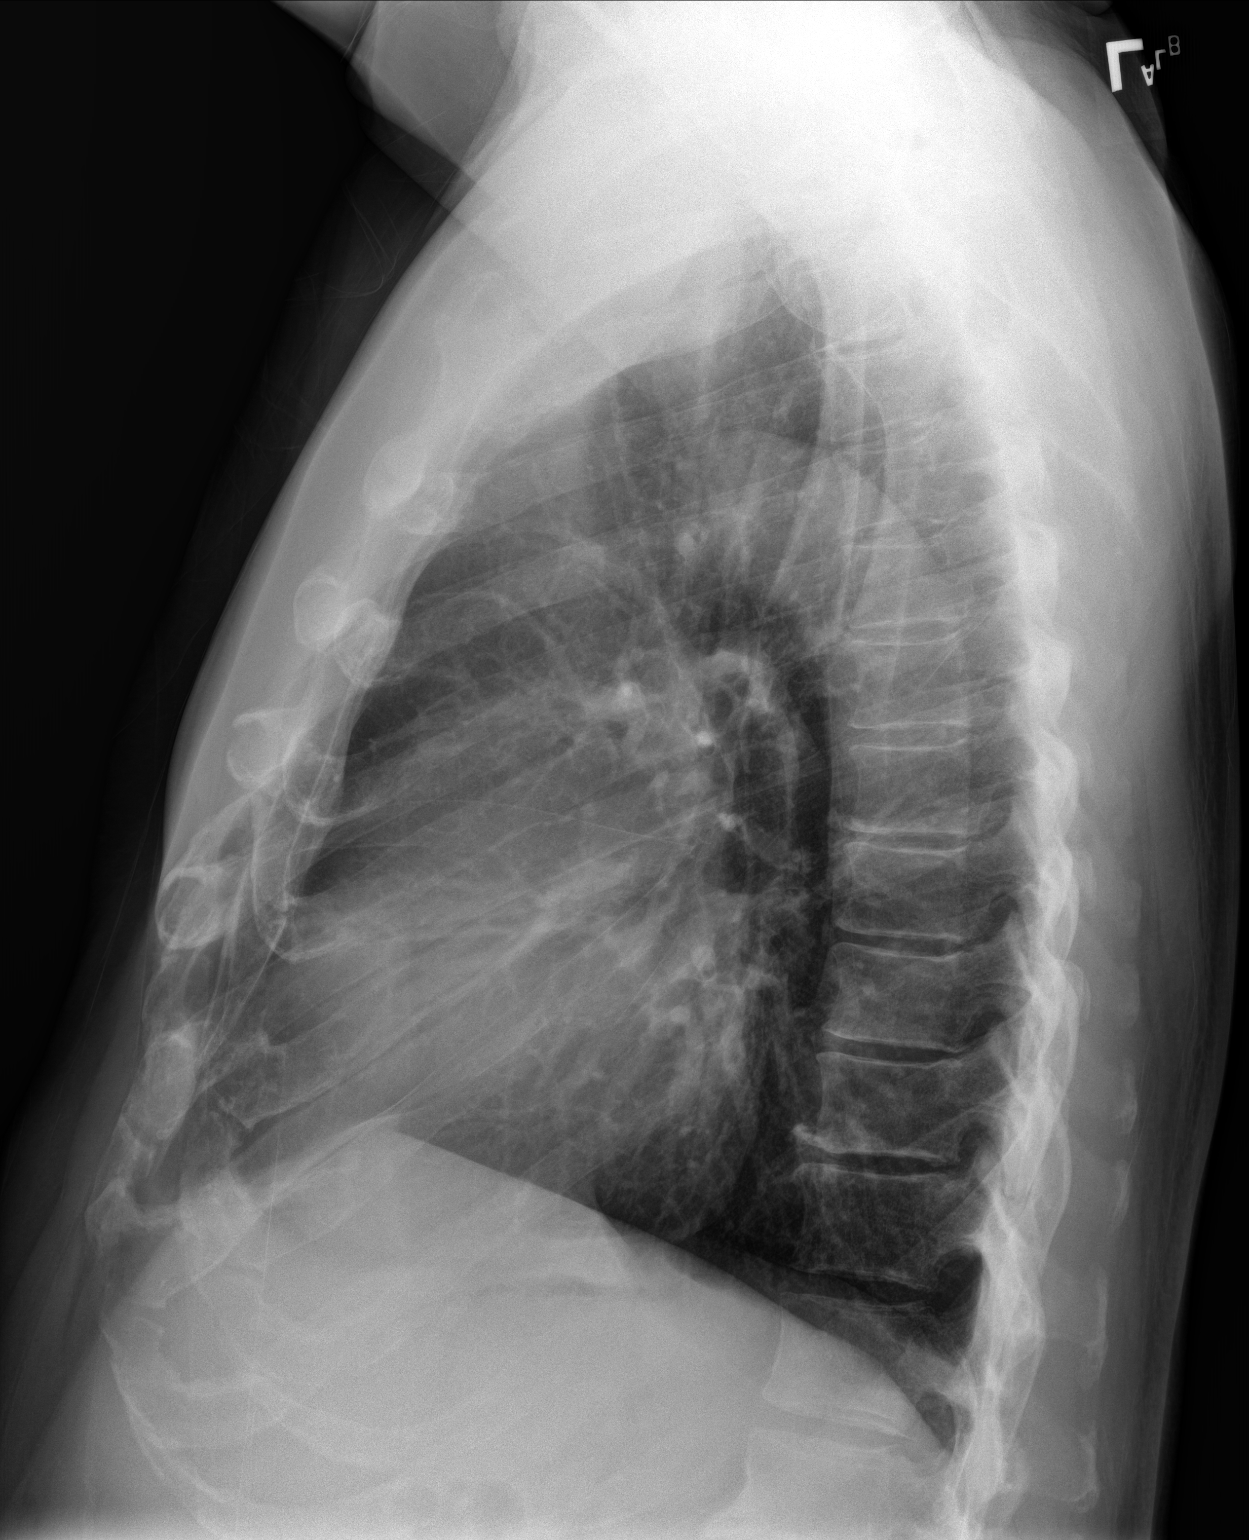

[2 of 2 positions shown; findings below may reference images not displayed]

FINDINGS: Mild cardiomegaly. No focal pulmonary infiltrate or effusion. No
pneumothorax. Mild degenerative changes of the spine.
IMPRESSION: Mild cardiomegaly.  No edema or infiltrate.

## 2019-01-19 NOTE — Progress Notes (Deleted)
Cardiology Office Note  Date:  01/19/2019   ID:  Steve Salazar, DOB 06-13-1950, MRN [id number]  PCP:  Patient, No Pcp Per   No chief complaint on file.   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   And follow-up reports having Pain in his right groin from suspected hernia scheduled to see surgery tomorrow at [facility]  For further evaluation Unable to walk, lift anything heavy secondary to discomfort   Typically blood pressure runs low at home,  running high today compliant with hisLasix, carvedilol, entresto.    Denies any significant shortness of breath, leg swelling, chest discomfort  Was initially retired and went back to work Wife reports that he periodically calls reporting fatigue and shortness of breath symptoms  EKG personally reviewed by myself on todays visit Shows normal sinus rhythm rate 68 bpm T-wave abnormality V6 1 and aVL No change compared to June 2018  Other past medical history April 2013 was in the emergency room last week for shortness of breath, abdominal bloating He was given Lasix IV with improvement of his symptoms Lab work reviewed with him, Potassium in the hospital for 4.0, BNP 1200,  chest x-ray mild interstitial edema  Previous Stress test showed severe cardiomyopathy with ejection fraction 25%, dilated left ventricle, low risk scan. Initial BNP on arrival to the hospital was 1342 He denied any history of drug or alcohol use, no cocaine. Previous lab work showing Total cholesterol 167, LDL 74, HDL 77.   Echo showing moderately dilated left ventricle, ejection fraction 25 to 30%, diastolic relaxation abnormality, normal RV size and function, mild to moderate mitral regurgitation, mild to moderately dilated left  atrium  PMH:   has a past medical history of Arrhythmia, CHF (congestive heart failure) (HCC), Hypertension, Hypertensive cardiomyopathy (HCC), and Renal insufficiency.  PSH:   No past surgical history on file.  Current Outpatient Medications  Medication Sig Dispense Refill  . bimatoprost (LUMIGAN) 0.01 % SOLN Place 1 drop into both eyes at bedtime.    . carvedilol (COREG) 6.25 MG tablet TAKE 1 TABLET(6.25 MG) BY MOUTH TWICE DAILY WITH A MEAL 180 tablet 0  . ENTRESTO 49-51 MG TAKE 1 TABLET BY MOUTH TWICE A DAY 60 tablet 0  . furosemide (LASIX) 40 MG tablet Take 1 tablet (40 mg total) by mouth daily as needed. 30 tablet 6  . metolazone (ZAROXOLYN) 5 MG tablet Take 1 tablet (5 mg total) by mouth daily as needed. 30 tablet 6  . naproxen sodium (ANAPROX) 220 MG tablet Take 220 mg by mouth as needed.    . sildenafil (REVATIO) 20 MG tablet TAKE 1 TABLET BY MOUTH THREE TIMES DAILY IF NEEDED 270 tablet 0  . tamsulosin (FLOMAX) 0.4 MG CAPS capsule Take 0.4 mg by mouth.     No current facility-administered medications for this visit.      Allergies:   Patient has no known allergies.   Social History:  The patient  reports that he has quit smoking. His smoking use included cigarettes. He has never used smokeless tobacco. He reports current alcohol use of about 2.0 standard drinks of alcohol per week. He reports that he does not use drugs.   Family History:   family history includes Heart attack in his father.    Review of Systems: Review  of Systems  Constitutional: Negative.   Respiratory: Negative.   Cardiovascular: Negative.   Gastrointestinal: Negative.        Right groin pain  Musculoskeletal: Negative.   Neurological: Negative.   Psychiatric/Behavioral: Negative.   All other systems reviewed and are negative.    PHYSICAL EXAM: VS:  There were no vitals taken for this visit. , BMI There is no height or weight on file to calculate BMI. GEN: Well nourished, well developed, in no  acute distress  HEENT: normal  Neck: no JVD, carotid bruits, or masses Cardiac: RRR; no murmurs, rubs, or gallops,no edema  Respiratory:  clear to auscultation bilaterally, normal work of breathing GI: soft, nontender, nondistended, + BS MS: no deformity or atrophy  Skin: warm and dry, no rash Neuro:  Strength and sensation are intact Psych: euthymic mood, full affect   Recent Labs: No results found for requested labs within last 8760 hours.    Lipid Panel Lab Results  Component Value Date   CHOL 208 (H) 01/26/2013   HDL 63 01/26/2013   LDLCALC 129 (H) 01/26/2013   TRIG 80 01/26/2013      Wt Readings from Last 3 Encounters:  09/02/17 219 lb 12 oz (99.7 kg)  11/05/16 225 lb (102.1 kg)  10/30/16 225 lb 8 oz (102.3 kg)       ASSESSMENT AND PLAN:  Acute on chronic systolic CHF (congestive heart failure) ([place]) - Plan: EKG 12-Lead Continue Lasix, carvedilol, entresto at current dose Typically blood pressure runs low, he will check blood pressure over the next 2 weeks and call our office with numbers Blood pressure could be elevated secondary to pain right groin If blood pressure does continue to run high we could add Aldactone, possibly increase entersto  Dilated cardiomyopathy Presumed to be nonischemic by her stress testing showing no ischemia No prior cardiac catheterization Medications as above  Essential hypertension - Plan: EKG 12-Lead As above, typically runs low but he will calls with numbers given they are running high today  Bradycardia - Plan: EKG 12-Lead Heart rate stable on low-dose carvedilol No changes made  SVT/ PSVT/ PAT - Plan: EKG 12-Lead Denies any tachycardia concerning for arrhythmia stable  DYSPNEA Periodic shortness of breath, no signs of congestive heart failure previously applied for disability but was denied now back at work Appears euvolemic  Combined arterial insufficiency and corporo-venous occlusive erectile dysfunction  generic  Viagra This can be managed by primary care   Total encounter time more than 25 minutes  Greater than 50% was spent in counseling and coordination of care with the patient   Disposition:   F/U  6 months   No orders of the defined types were placed in this encounter.    Signed, Esmond Plants, M.D., Ph.D. 01/19/2019  [place], [place]

## 2019-01-20 ENCOUNTER — Ambulatory Visit: Payer: Medicare HMO | Admitting: Cardiovascular Disease

## 2019-03-31 DIAGNOSIS — U071 COVID-19: Secondary | ICD-10-CM

## 2019-03-31 HISTORY — DX: COVID-19: U07.1

## 2019-04-26 ENCOUNTER — Other Ambulatory Visit: Payer: Self-pay | Admitting: Cardiovascular Disease

## 2019-04-28 ENCOUNTER — Telehealth: Payer: Self-pay | Admitting: Cardiovascular Disease

## 2019-04-28 NOTE — Telephone Encounter (Signed)
-----   Message from Janan Ridge, [place] sent at 04/27/2019 12:28 PM EST ----- Regarding: Needs appointment Patient needs an appointment for further refills. If patient does not want to schedule an appointment please make them aware to contact PCP for refills. I have sent in enough medication until appointment can be made.   Thanks Ladies!

## 2019-04-28 NOTE — Telephone Encounter (Signed)
No ans no vm   °

## 2019-04-30 NOTE — Telephone Encounter (Signed)
No ans no vm   °

## 2019-05-18 ENCOUNTER — Encounter: Payer: Self-pay | Admitting: Physician Assistant

## 2019-05-18 ENCOUNTER — Other Ambulatory Visit: Payer: Self-pay

## 2019-05-18 ENCOUNTER — Other Ambulatory Visit
Admission: RE | Admit: 2019-05-18 | Discharge: 2019-05-18 | Disposition: A | Discharge status: Home / Self Care | Payer: Medicare Other | Source: Ambulatory Visit | Attending: Physician Assistant | Admitting: Physician Assistant

## 2019-05-18 ENCOUNTER — Ambulatory Visit (INDEPENDENT_AMBULATORY_CARE_PROVIDER_SITE_OTHER): Payer: Medicare Other | Admitting: Physician Assistant

## 2019-05-18 VITALS — BP 142/80 | HR 88 | Ht 71.0 in | Wt 207.2 lb

## 2019-05-18 DIAGNOSIS — I110 Hypertensive heart disease with heart failure: Secondary | ICD-10-CM

## 2019-05-18 DIAGNOSIS — I471 Supraventricular tachycardia: Secondary | ICD-10-CM

## 2019-05-18 DIAGNOSIS — I5022 Chronic systolic (congestive) heart failure: Secondary | ICD-10-CM | POA: Diagnosis present

## 2019-05-18 DIAGNOSIS — U071 COVID-19: Secondary | ICD-10-CM | POA: Diagnosis not present

## 2019-05-18 LAB — BASIC METABOLIC PANEL
Anion gap: 9 (ref 5–15)
BUN: 16 mg/dL (ref 8–23)
CO2: 24 mmol/L (ref 22–32)
Calcium: 8.7 mg/dL — ABNORMAL LOW (ref 8.9–10.3)
Chloride: 108 mmol/L (ref 98–111)
Creatinine, Ser: 1.29 mg/dL — ABNORMAL HIGH (ref 0.61–1.24)
GFR calc Af Amer: >60 mL/min (ref >60)
GFR calc non Af Amer: 56 mL/min — ABNORMAL LOW (ref >60)
Glucose, Bld: 112 mg/dL — ABNORMAL HIGH (ref 70–99)
Potassium: 3.9 mmol/L (ref 3.5–5.1)
Sodium: 141 mmol/L (ref 135–145)

## 2019-05-18 MED ORDER — CARVEDILOL 12.5 MG PO TABS: 12.5000 mg | ORAL_TABLET | Freq: Two times a day (BID) | ORAL | 3 refills | Status: DC

## 2019-05-18 NOTE — Patient Instructions (Signed)
Medication Instructions:  1- INCREASE Coreg Take 1 tablet (12.5 mg total) by mouth 2 (two) times daily with a meal  *If you need a refill on your cardiac medications before your next appointment, please call your pharmacy*  Lab Work: Your physician recommends that you return for lab work today at [facility] No appt is needed. Hours are M-F 7AM- 6 PM.  If you have labs (blood work) drawn today and your tests are completely normal, you will receive your results only by: Marland Kitchen MyChart Message (if you have MyChart) OR . A paper copy in the mail If you have any lab test that is abnormal or we need to change your treatment, we will call you to review the results.  Testing/Procedures: None ordered   Follow-Up: At [facility], you and your health needs are our priority.  As part of our continuing mission to provide you with exceptional heart care, we have created designated Provider Care Teams.  These Care Teams include your primary Cardiologist (physician) and Advanced Practice Providers (APPs -  Physician Assistants and Nurse Practitioners) who all work together to provide you with the care you need, when you need it.  Your next appointment:   2 week(s)  The format for your next appointment:   In Person  Provider:    You may see [name], MD or Eula Listen, PA-C.

## 2019-05-18 NOTE — Progress Notes (Signed)
Cardiology Office Note    Date:  05/18/2019   ID:  Steve Salazar, DOB 04/13/1951, MRN [id number]  PCP:  Patient, No Pcp Per  Cardiologist:  [name], MD  Electrophysiologist:  None   Chief Complaint: Follow-up  History of Present Illness:   Steve Salazar is a 69 y.o. male with history of HFrEF secondary to presumed NICM, paroxysmal SVT, recent COVID-19 infection in 03/2019, and hypertensive heart disease who presents for follow-up of his cardiomyopathy.  Patient was admitted to the hospital in 2011 with dyspnea.  Notes indicate echo showed an EF of 25-30%, diastolic dysfunction, mild to moderate mitral regurgitation, moderately dilated left atrium, and normal RV systolic function and size.  Nuclear stress test in 2011 showed decreased perfusion in the basal to distal inferior wall as well as inferior apical region concerning for attenuation artifact as well as GI uptake artifact in the mid to distal inferior wall with an EF of 27%.  Overall, this was read as a low risk scan.  Holter monitor from 06/2009 showed 2 runs of SVT with the longest lasting 8 minutes and unable to exclude atrial flutter.  Most recent echo from 10/2016, to evaluate his cardiomyopathy, showed a persistently low LV systolic function with an EF of 20 to 25%, diffuse hypokinesis with akinesis of the inferior myocardium, grade 1 diastolic dysfunction, mild mitral vegetation, normal RV systolic function, normal PASP.  Report indicated he developed a wide-complex tachycardia with a rate of 116 bpm during the study.  In this setting, he underwent outpatient cardiac monitoring which showed NSR with 3 episodes of SVT lasting less than 1 minute in duration with a rate up to 180 bpm and no other significant arrhythmia.  He was last seen in 08/2017 noting some intermittent fatigue and shortness of breath.  He was dealing with a suspected hernia at that time.  He indicated his blood pressure typically ran on the low side.  No  changes were made.  He has been maintained on carvedilol 6.25 mg twice daily, Entresto 49/81 mg twice daily, Lasix 40 mg daily as needed, as well as metolazone 5 mg daily as needed.  He was seen in the [facility] ED in 03/2019 with dyspnea and found to have pneumonia secondary to COVID-19.  He did not require hospital admission.  Chest x-ray showed right greater than left basilar predominant hazy airspace opacities with no pleural effusion or pneumothorax with noted cardiomegaly.    He comes in accompanied by his fiance today and is doing reasonably well from a cardiac perspective.  He is tolerating carvedilol and Entresto without issue.  He indicates he typically takes as needed Lasix 40 mg several times per month, and most recently took this this morning secondary to some mild orthopnea overnight with associated abdominal distention.  With this, symptoms have improved.  His weight is down 12 pounds when compared to his last office visit.  He denies any lower extremity swelling.  He does eat takeout food somewhat often and tries not to add salt to his food.  He has not needed any as needed metolazone.  No dizziness, presyncope, syncope.  He remains somewhat fatigued and weak following his COVID-19 infection.   Labs independently reviewed: 03/2019 - COVID-19 detected, potassium 4.3, BUN 16, serum creatinine 1.23, albumin 4.0, AST 93, ALT 107, Hgb 12.2, PLT 150, BNP 3810, troponin less than 0.06 06/2018 - TC 183, TG 55, HDL 54, LDL 118  Past Medical History:  Diagnosis Date  .  Arrhythmia    SVT verus A-fib  . CHF (congestive heart failure) (HCC)   . Hypertension   . Hypertensive cardiomyopathy (HCC)    EF 35%  . Renal insufficiency    Mild chronic    History reviewed. No pertinent surgical history.  Current Medications: Current Meds  Medication Sig  . bimatoprost (LUMIGAN) 0.01 % SOLN Place 1 drop into both eyes at bedtime.  . carvedilol (COREG) 12.5 MG tablet Take 1 tablet (12.5 mg total) by  mouth 2 (two) times daily with a meal.  . ENTRESTO 49-51 MG TAKE 1 TABLET BY MOUTH TWICE DAILY  . furosemide (LASIX) 40 MG tablet Take 1 tablet (40 mg total) by mouth daily as needed.  . metolazone (ZAROXOLYN) 5 MG tablet Take 1 tablet (5 mg total) by mouth daily as needed.  . naproxen sodium (ANAPROX) 220 MG tablet Take 220 mg by mouth as needed.  . sildenafil (REVATIO) 20 MG tablet TAKE 1 TABLET BY MOUTH THREE TIMES DAILY IF NEEDED  . tamsulosin (FLOMAX) 0.4 MG CAPS capsule Take 0.4 mg by mouth.  . [DISCONTINUED] carvedilol (COREG) 6.25 MG tablet TAKE 1 TABLET(6.25 MG) BY MOUTH TWICE DAILY WITH A MEAL    Allergies:   Patient has no known allergies.   Social History   Socioeconomic History  . Marital status: Single    Spouse name: Not on file  . Number of children: Not on file  . Years of education: Not on file  . Highest education level: Not on file  Occupational History  . Occupation: Full time  Tobacco Use  . Smoking status: Former Smoker    Types: Cigarettes  . Smokeless tobacco: Never Used  . Tobacco comment: Tobaco use-no  Substance and Sexual Activity  . Alcohol use: Yes    Alcohol/week: 2.0 standard drinks    Types: 2 Cans of beer per week    Comment: ocassional  . Drug use: No  . Sexual activity: Not on file  Other Topics Concern  . Not on file  Social History Narrative   Pt gets regular exercise.   Social Determinants of Health   Financial Resource Strain:   . Difficulty of Paying Living Expenses: Not on file  Food Insecurity:   . Worried About Programme researcher, broadcasting/film/video in the Last Year: Not on file  . Ran Out of Food in the Last Year: Not on file  Transportation Needs:   . Lack of Transportation (Medical): Not on file  . Lack of Transportation (Non-Medical): Not on file  Physical Activity:   . Days of Exercise per Week: Not on file  . Minutes of Exercise per Session: Not on file  Stress:   . Feeling of Stress : Not on file  Social Connections:   .  Frequency of Communication with Friends and Family: Not on file  . Frequency of Social Gatherings with Friends and Family: Not on file  . Attends Religious Services: Not on file  . Active Member of Clubs or Organizations: Not on file  . Attends Banker Meetings: Not on file  . Marital Status: Not on file     Family History:  The patient's family history includes Heart attack in his father.  ROS:   Review of Systems  Constitutional: Positive for malaise/fatigue. Negative for chills, diaphoresis, fever and weight loss.  HENT: Negative for congestion.   Eyes: Negative for discharge and redness.  Respiratory: Negative for cough, hemoptysis, sputum production, shortness of breath and wheezing.  Cardiovascular: Positive for orthopnea. Negative for chest pain, palpitations, claudication, leg swelling and PND.  Gastrointestinal: Negative for abdominal pain, blood in stool, heartburn, melena, nausea and vomiting.  Genitourinary: Negative for hematuria.  Musculoskeletal: Negative for falls and myalgias.  Skin: Negative for rash.  Neurological: Positive for weakness. Negative for dizziness, tingling, tremors, sensory change, speech change, focal weakness and loss of consciousness.  Endo/Heme/Allergies: Does not bruise/bleed easily.  Psychiatric/Behavioral: Negative for substance abuse. The patient is not nervous/anxious.   All other systems reviewed and are negative.    EKGs/Labs/Other Studies Reviewed:    Studies reviewed were summarized above. The additional studies were reviewed today:  2D echo 10/2016: - Left ventricle: The cavity size was moderately dilated. Systolic   function was severely reduced. The estimated ejection fraction   was in the range of 20% to 25%. Diffuse hypokinesis. Akinesis of   the inferior myocardium. Doppler parameters are consistent with   abnormal left ventricular relaxation (grade 1 diastolic   dysfunction). - Mitral valve: There was mild  regurgitation. - Left atrium: The atrium was normal in size. - Right ventricle: Systolic function was normal. - Pulmonary arteries: Systolic pressure was within the normal   range.  Impressions:  - Patient developed wide complex tachycardia, rate 116 bpm during   the study. __________  Outpatient cardiac monitoring 10/2016: Normal sinus rhythm 3 episodes of SVT lasting <1 min Rate up to 180 bpm  No other significant arrhythmia   EKG:  EKG is ordered today.  The EKG ordered today demonstrates NSR, 88 bpm, left axis deviation, occasional PVCs, LVH, nonspecific lateral T wave inversion  Recent Labs: No results found for requested labs within last 8760 hours.  Recent Lipid Panel    Component Value Date/Time   CHOL 208 (H) 01/26/2013 0826   TRIG 80 01/26/2013 0826   HDL 63 01/26/2013 0826   CHOLHDL 3.3 01/26/2013 0826   LDLCALC 129 (H) 01/26/2013 0826    PHYSICAL EXAM:    VS:  BP (!) 142/80 (BP Location: Left Arm, Patient Position: Sitting, Cuff Size: Normal)   Pulse 88   Ht 5\' 11"  (1.803 m)   Wt 207 lb 4 oz (94 kg)   SpO2 95%   BMI 28.91 kg/m   BMI: Body mass index is 28.91 kg/m.  Physical Exam  Constitutional: He is oriented to person, place, and time. He appears well-developed and well-nourished.  HENT:  Head: Normocephalic and atraumatic.  Eyes: Right eye exhibits no discharge. Left eye exhibits no discharge.  Neck: No JVD present.  Cardiovascular: Normal rate, regular rhythm, S1 normal, S2 normal and normal heart sounds. Exam reveals no distant heart sounds, no friction rub, no midsystolic click and no opening snap.  No murmur heard. Pulses:      Posterior tibial pulses are 2+ on the right side and 2+ on the left side.  Pulmonary/Chest: Effort normal and breath sounds normal. No respiratory distress. He has no decreased breath sounds. He has no wheezes. He has no rales. He exhibits no tenderness.  Abdominal: Soft. He exhibits no distension. There is no  abdominal tenderness.  Musculoskeletal:        General: No edema.     Cervical back: Normal range of motion.  Neurological: He is alert and oriented to person, place, and time.  Skin: Skin is warm and dry. No cyanosis. Nails show no clubbing.  Psychiatric: He has a normal mood and affect. His speech is normal and behavior is normal. Judgment and thought  content normal.    Wt Readings from Last 3 Encounters:  05/18/19 207 lb 4 oz (94 kg)  09/02/17 219 lb 12 oz (99.7 kg)  11/05/16 225 lb (102.1 kg)     ASSESSMENT & PLAN:   1. HFrEF secondary to presumed NICM: He is euvolemic and well compensated.  His weight is down 12 pounds when compared to his most recent office visit.  Titrate carvedilol to 12.5 mg twice daily given occasional PVCs noted on EKG and in the setting of optimizing GDMT.  Check BMP today and if renal function/potassium allow plan to start spironolactone 12.5 mg daily on 05/19/2019.  Continue current dose Entresto 49/51 mg twice daily.  He will be seen in 2 weeks time for further optimization of evidence-based therapy.  Plan to repeat echo in 06/2019 after medication optimization to evaluate LV systolic function.  If his cardiomyopathy persists, plan for [facility] for definitive evaluation as well as referral to EP if his EF remains less than 35% for consideration of ICD.  Continue as needed Lasix for shortness of breath, lower extremity swelling, or weight gain greater than 3 pounds overnight or 5 pounds in 1 week time span.  Discontinue metolazone.  If needed for adequate diuresis, he can notify us to double up his Lasix briefly.  CHF education discussed in detail.  2. Paroxysmal SVT/PVCs: Asymptomatic.  Titrate carvedilol to 12.5 mg twice daily.  Check BMP today.  Following optimization of GDMT, if PVCs persist we will plan for a 3-day Zio patch to quantify burden as well as plan for likely ischemic evaluation as outlined above following updated echo.  3. Hypertensive heart disease:  Blood pressure remains suboptimally controlled.  Titrate carvedilol as outlined above.  Otherwise, he will continue current dose Entresto with planned initiation of spironolactone, pending BMP obtained today as outlined above.  Low-sodium diet recommended.  4. COVID-19 infection: Education discussed.  Obtain echo in the spring 2021 as outlined above.  Disposition: F/u with Dr. Rockey Situ or an APP in 2 to 4 weeks.   Medication Adjustments/Labs and Tests Ordered: Current medicines are reviewed at length with the patient today.  Concerns regarding medicines are outlined above. Medication changes, Labs and Tests ordered today are summarized above and listed in the Patient Instructions accessible in Encounters.   Signed, Christell Faith, PA-C 05/18/2019 4:39 PM     [place] [address] [place], [place] [address] [phone]

## 2019-05-19 ENCOUNTER — Telehealth: Payer: Self-pay

## 2019-05-19 DIAGNOSIS — I5022 Chronic systolic (congestive) heart failure: Secondary | ICD-10-CM

## 2019-05-19 NOTE — Telephone Encounter (Signed)
-----   Message from Sondra Barges, PA-C sent at 05/18/2019  5:25 PM EST ----- Renal function is stable when compared to value obtained in 03/2019 at [facility].  He does have some degree of underlying kidney disease which may be in the setting of hypertension.  Potassium is okay.  Please have him start spironolactone 12.5 mg daily with recheck BMET 05/25/2019.  Follow-up as planned in 2 to 4 weeks for further optimization of evidence-based therapy.

## 2019-05-19 NOTE — Telephone Encounter (Signed)
Call attempted. No answer. No vm  

## 2019-05-22 MED ORDER — SPIRONOLACTONE 25 MG PO TABS: 12.5000 mg | ORAL_TABLET | Freq: Every day | ORAL | 3 refills | Status: DC

## 2019-05-22 NOTE — Telephone Encounter (Signed)
Call to patient to review results and POC from Eula Listen, PA>   Pt verbalized understanding and is in agreement with POC.   No further questions at this time. All orders placed.   [facility] for repeat labs.   Advised pt to call for any further questions or concerns.

## 2019-05-25 NOTE — Progress Notes (Signed)
Cardiology Office Note    Date:  06/01/2019   ID:  Steve Salazar, DOB 1951-01-28, MRN [id number]  PCP:  [name], MD  Cardiologist:  [name], MD  Electrophysiologist:  None   Chief Complaint: Follow-up  History of Present Illness:   Steve Salazar is a 69 y.o. male with history of HFrEF secondary to presumed NICM, paroxysmal SVT, recent COVID-19 infection in 03/2019, and hypertensive heart disease who presents for follow-up of his cardiomyopathy.  Patient was admitted to the hospital in 2011 with dyspnea.  Notes indicate echo showed an EF of 25-30%, diastolic dysfunction, mild to moderate mitral regurgitation, moderately dilated left atrium, and normal RV systolic function and size.  Nuclear stress test in 2011 showed decreased perfusion in the basal to distal inferior wall as well as inferior apical region concerning for attenuation artifact as well as GI uptake artifact in the mid to distal inferior wall with an EF of 27%.  Overall, this was read as a low risk scan.  Holter monitor from 06/2009 showed 2 runs of SVT with the longest lasting 8 minutes and unable to exclude atrial flutter.  Most recent echo from 10/2016, to evaluate his cardiomyopathy, showed a persistently low LV systolic function with an EF of 20 to 25%, diffuse hypokinesis with akinesis of the inferior myocardium, grade 1 diastolic dysfunction, mild mitral vegetation, normal RV systolic function, normal PASP.  Report indicated he developed a wide-complex tachycardia with a rate of 116 bpm during the study.  In this setting, he underwent outpatient cardiac monitoring which showed NSR with 3 episodes of SVT lasting less than 1 minute in duration with a rate up to 180 bpm and no other significant arrhythmia.  He was seen in the [facility] ED in 03/2019 with dyspnea and found to have pneumonia secondary to COVID-19.  He did not require hospital admission.  Chest x-ray showed right greater than left basilar  predominant hazy airspace opacities with no pleural effusion or pneumothorax with noted cardiomegaly.    He was seen in cardiology follow-up on 05/18/2019 and doing reasonably well from a cardiac perspective.  He continued to note some fatigue and weakness following his COVID-19 infection.  He was tolerating carvedilol and Entresto.  He indicated he was taking as needed Lasix 40 mg several times per month.  His weight was down 12 pounds when compared to his visit in 08/2017.  He had not taken any as needed metolazone.  We had a lengthy discussion regarding optimization of GDMT with plans for repeat echo and likely [facility] with potential referral to EP if his cardiomyopathy persisted.  Metolazone was discontinued.  Carvedilol was titrated to 12.5 mg twice daily given his cardiomyopathy and occasional PVCs noted on EKG.  He was continued on Entresto.  Following labs as outlined below, he was started on spironolactone with recommendation to repeat BMP 1 week after initiation of medication.  He comes in doing well today.  He continues to need as needed Lasix 40 mg approximately once per week, last taking this this morning secondary to some dyspnea, abdominal distention, and orthopnea the prior evening.  He denies any chest pain or lower extremity swelling.  He remains very active throughout the day and is drinking water consistently.  His weight is down 4 pounds today when compared to his visit on 05/18/2019.  He has tolerated the addition of spironolactone and titration of carvedilol without issues.  He is not adding salt or eating high sodium foods.  He also notes some intermittent reflux.   Labs independently reviewed: 05/2019 - potassium 3.9, BUN 16, creatinine 1.29 03/2019 - COVID-19 detected, albumin 4.0, AST 93, ALT 107, Hgb 12.2, PLT 150, BNP 3810, troponin less than 0.06 06/2018 - TC 183, TG 55, HDL 54, LDL 118  Past Medical History:  Diagnosis Date  . Arrhythmia    SVT verus A-fib  . CHF (congestive  heart failure) (HCC)   . Hypertension   . Hypertensive cardiomyopathy (HCC)    EF 35%  . Renal insufficiency    Mild chronic    History reviewed. No pertinent surgical history.  Current Medications: Current Meds  Medication Sig  . bimatoprost (LUMIGAN) 0.01 % SOLN Place 1 drop into both eyes at bedtime.  . carvedilol (COREG) 25 MG tablet Take 1 tablet (25 mg total) by mouth 2 (two) times daily with a meal.  . ENTRESTO 49-51 MG TAKE 1 TABLET BY MOUTH TWICE DAILY  . furosemide (LASIX) 40 MG tablet Take 1 tablet (40 mg total) once daily on Monday and Thursday each week.  . metolazone (ZAROXOLYN) 5 MG tablet Take 1 tablet (5 mg total) by mouth daily as needed.  . naproxen sodium (ANAPROX) 220 MG tablet Take 220 mg by mouth as needed.  . sildenafil (REVATIO) 20 MG tablet TAKE 1 TABLET BY MOUTH THREE TIMES DAILY IF NEEDED  . spironolactone (ALDACTONE) 25 MG tablet Take 0.5 tablets (12.5 mg total) by mouth daily.  . tamsulosin (FLOMAX) 0.4 MG CAPS capsule Take 0.4 mg by mouth.  . [DISCONTINUED] carvedilol (COREG) 12.5 MG tablet Take 1 tablet (12.5 mg total) by mouth 2 (two) times daily with a meal.  . [DISCONTINUED] furosemide (LASIX) 40 MG tablet Take 1 tablet (40 mg total) by mouth daily as needed.    Allergies:   Patient has no known allergies.   Social History   Socioeconomic History  . Marital status: Single    Spouse name: Not on file  . Number of children: Not on file  . Years of education: Not on file  . Highest education level: Not on file  Occupational History  . Occupation: Full time  Tobacco Use  . Smoking status: Former Smoker    Types: Cigarettes  . Smokeless tobacco: Never Used  . Tobacco comment: Tobaco use-no  Substance and Sexual Activity  . Alcohol use: Yes    Alcohol/week: 2.0 standard drinks    Types: 2 Cans of beer per week    Comment: ocassional  . Drug use: No  . Sexual activity: Not on file  Other Topics Concern  . Not on file  Social History  Narrative   Pt gets regular exercise.   Social Determinants of Health   Financial Resource Strain:   . Difficulty of Paying Living Expenses: Not on file  Food Insecurity:   . Worried About Programme researcher, broadcasting/film/video in the Last Year: Not on file  . Ran Out of Food in the Last Year: Not on file  Transportation Needs:   . Lack of Transportation (Medical): Not on file  . Lack of Transportation (Non-Medical): Not on file  Physical Activity:   . Days of Exercise per Week: Not on file  . Minutes of Exercise per Session: Not on file  Stress:   . Feeling of Stress : Not on file  Social Connections:   . Frequency of Communication with Friends and Family: Not on file  . Frequency of Social Gatherings with Friends and Family: Not on file  .  Attends Religious Services: Not on file  . Active Member of Clubs or Organizations: Not on file  . Attends Archivist Meetings: Not on file  . Marital Status: Not on file     Family History:  The patient's family history includes Heart attack in his father.  ROS:   Review of Systems  Constitutional: Positive for malaise/fatigue. Negative for chills, diaphoresis, fever and weight loss.  HENT: Negative for congestion.   Eyes: Negative for discharge and redness.  Respiratory: Positive for shortness of breath. Negative for cough, sputum production and wheezing.   Cardiovascular: Negative for chest pain, palpitations, orthopnea, claudication, leg swelling and PND.  Gastrointestinal: Positive for heartburn. Negative for abdominal pain, blood in stool, melena, nausea and vomiting.       Abdominal distention  Musculoskeletal: Negative for falls and myalgias.  Skin: Negative for rash.  Neurological: Negative for dizziness, tingling, tremors, sensory change, speech change, focal weakness, loss of consciousness and weakness.  Endo/Heme/Allergies: Does not bruise/bleed easily.  Psychiatric/Behavioral: Negative for substance abuse. The patient is not  nervous/anxious.   All other systems reviewed and are negative.    EKGs/Labs/Other Studies Reviewed:    Studies reviewed were summarized above. The additional studies were reviewed today:  2D echo 10/2016: - Left ventricle: The cavity size was moderately dilated. Systolic function was severely reduced. The estimated ejection fraction was in the range of 20% to 25%. Diffuse hypokinesis. Akinesis of the inferior myocardium. Doppler parameters are consistent with abnormal left ventricular relaxation (grade 1 diastolic dysfunction). - Mitral valve: There was mild regurgitation. - Left atrium: The atrium was normal in size. - Right ventricle: Systolic function was normal. - Pulmonary arteries: Systolic pressure was within the normal range.  Impressions:  - Patient developed wide complex tachycardia, rate 116 bpm during the study. __________  Outpatient cardiac monitoring 10/2016: Normal sinus rhythm 3 episodes of SVT lasting <1 min Rate up to 180 bpm  No other significant arrhythmia   EKG:  EKG is ordered today.  The EKG ordered today demonstrates NSR, 73 bpm, first-degree AV block, occasional PVCs, left atrial enlargement, left axis deviation, possible T wave inversion largely unchanged from compared to prior  Recent Labs: 05/18/2019: BUN 16; Creatinine, Ser 1.29; Potassium 3.9; Sodium 141  Recent Lipid Panel    Component Value Date/Time   CHOL 208 (H) 01/26/2013 0826   TRIG 80 01/26/2013 0826   HDL 63 01/26/2013 0826   CHOLHDL 3.3 01/26/2013 0826   LDLCALC 129 (H) 01/26/2013 0826    PHYSICAL EXAM:    VS:  BP 120/80 (BP Location: Left Arm, Patient Position: Sitting, Cuff Size: Normal)   Pulse 73   Ht 5\' 11"  (1.803 m)   Wt 203 lb (92.1 kg)   SpO2 94%   BMI 28.31 kg/m   BMI: Body mass index is 28.31 kg/m.  Physical Exam  Constitutional: He is oriented to person, place, and time. He appears well-developed and well-nourished.  HENT:  Head:  Normocephalic and atraumatic.  Eyes: Right eye exhibits no discharge. Left eye exhibits no discharge.  Neck: No JVD present.  Cardiovascular: Normal rate, regular rhythm, S1 normal, S2 normal and normal heart sounds. Exam reveals no distant heart sounds, no friction rub, no midsystolic click and no opening snap.  No murmur heard. Pulses:      Posterior tibial pulses are 2+ on the right side and 2+ on the left side.  Pulmonary/Chest: Effort normal and breath sounds normal. No respiratory distress. He has no  decreased breath sounds. He has no wheezes. He has no rales. He exhibits no tenderness.  Abdominal: Soft. He exhibits no distension. There is no abdominal tenderness.  Musculoskeletal:        General: No edema.     Cervical back: Normal range of motion.  Neurological: He is alert and oriented to person, place, and time.  Skin: Skin is warm and dry. No cyanosis. Nails show no clubbing.  Psychiatric: He has a normal mood and affect. His speech is normal and behavior is normal. Judgment and thought content normal.    Wt Readings from Last 3 Encounters:  06/01/19 203 lb (92.1 kg)  05/18/19 207 lb 4 oz (94 kg)  09/02/17 219 lb 12 oz (99.7 kg)     ASSESSMENT & PLAN:   1. HFrEF secondary to presumed NICM: He appears euvolemic and well compensated.  Historically, he has needed Lasix 40 mg approximately once per week secondary to intermittent volume overload.  However, when he does take Lasix he indicates he is quite symptomatic with shortness of breath and abdominal distention.  In this setting, we have agreed to have him undergo a trial run of Lasix 40 mg on Mondays and Thursdays in an effort to prevent these dyspneic episodes.  Titrate carvedilol to 25 mg twice daily.  Continue current dose Entresto and spironolactone.  Plan for repeat echo in approximately 3 months after medication optimization to evaluate LV systolic function.  If his cardiomyopathy persists at that time, plan for [facility] for  definitive evaluation as well as referral to EP if his EF remains less than 35% for consideration of ICD. CHF education.  Check BMP (originally recommended to be checked on 1/25) given recently started spironolactone.  2. Paroxysmal SVT/PVCs: Asymptomatic.  Titrate carvedilol to 25 mg twice daily.  Following optimization of GDMT, if PVCs persist plan for 3-day Zio patch to quantify PVC burden as well as plan for likely ischemic evaluation as outlined above pending updated echo after optimization of evidence-based therapy.  Check BMP as outlined above as well as TSH and magnesium.  3. Hypertensive heart disease: Blood pressure is well controlled today.  4. COVID-19 infection: Obtain echo in the spring 2021 as outlined above.  Disposition: F/u with Dr. Mariah Milling or an APP in 2 weeks.   Medication Adjustments/Labs and Tests Ordered: Current medicines are reviewed at length with the patient today.  Concerns regarding medicines are outlined above. Medication changes, Labs and Tests ordered today are summarized above and listed in the Patient Instructions accessible in Encounters.   Signed, Eula Listen, PA-C 06/01/2019 2:29 PM     [facility] -  [address] [place], [place] [address] [phone]

## 2019-06-01 ENCOUNTER — Ambulatory Visit (INDEPENDENT_AMBULATORY_CARE_PROVIDER_SITE_OTHER): Payer: Medicare Other | Admitting: Physician Assistant

## 2019-06-01 ENCOUNTER — Encounter: Payer: Self-pay | Admitting: Physician Assistant

## 2019-06-01 ENCOUNTER — Other Ambulatory Visit: Payer: Self-pay

## 2019-06-01 VITALS — BP 120/80 | HR 73 | Ht 71.0 in | Wt 203.0 lb

## 2019-06-01 DIAGNOSIS — I471 Supraventricular tachycardia: Secondary | ICD-10-CM

## 2019-06-01 DIAGNOSIS — U071 COVID-19: Secondary | ICD-10-CM

## 2019-06-01 DIAGNOSIS — I428 Other cardiomyopathies: Secondary | ICD-10-CM

## 2019-06-01 DIAGNOSIS — I110 Hypertensive heart disease with heart failure: Secondary | ICD-10-CM

## 2019-06-01 DIAGNOSIS — I5022 Chronic systolic (congestive) heart failure: Secondary | ICD-10-CM | POA: Diagnosis not present

## 2019-06-01 MED ORDER — FUROSEMIDE 40 MG PO TABS: ORAL_TABLET | ORAL | 6 refills | Status: DC

## 2019-06-01 MED ORDER — FAMOTIDINE 20 MG PO TABS: 20.0000 mg | ORAL_TABLET | Freq: Every day | ORAL | 11 refills | Status: DC

## 2019-06-01 MED ORDER — CARVEDILOL 25 MG PO TABS: 25.0000 mg | ORAL_TABLET | Freq: Two times a day (BID) | ORAL | 3 refills | Status: DC

## 2019-06-01 NOTE — Patient Instructions (Signed)
Medication Instructions:  1- INCREASE Coreg to 1 tablet (25 mg total) twice daily 2- INCREASE Lasix Take 1 tablet (40 mg total) once daily on Monday and Thursday each week 3- START Pepcid Take 1 tablet (20 mg total) by mouth daily *If you need a refill on your cardiac medications before your next appointment, please call your pharmacy*  Lab Work: Your physician recommends that you have lab work today(BMET, Mag, Tsh)  If you have labs (blood work) drawn today and your tests are completely normal, you will receive your results only by: Marland Kitchen MyChart Message (if you have MyChart) OR . A paper copy in the mail If you have any lab test that is abnormal or we need to change your treatment, we will call you to review the results.  Testing/Procedures: None ordered   Follow-Up: At [facility], you and your health needs are our priority.  As part of our continuing mission to provide you with exceptional heart care, we have created designated Provider Care Teams.  These Care Teams include your primary Cardiologist (physician) and Advanced Practice Providers (APPs -  Physician Assistants and Nurse Practitioners) who all work together to provide you with the care you need, when you need it.  Your next appointment:   2 week(s)  The format for your next appointment:   In Person  Provider:    You may see [name], MD or Eula Listen, PA-C.

## 2019-06-02 LAB — BASIC METABOLIC PANEL
BUN/Creatinine Ratio: 15 (ref 10–24)
BUN: 20 mg/dL (ref 8–27)
CO2: 23 mmol/L (ref 20–29)
Calcium: 9.2 mg/dL (ref 8.6–10.2)
Chloride: 104 mmol/L (ref 96–106)
Creatinine, Ser: 1.32 mg/dL — ABNORMAL HIGH (ref 0.76–1.27)
GFR calc Af Amer: 63 mL/min/{1.73_m2} (ref >59)
GFR calc non Af Amer: 55 mL/min/{1.73_m2} — ABNORMAL LOW (ref >59)
Glucose: 88 mg/dL (ref 65–99)
Potassium: 4.2 mmol/L (ref 3.5–5.2)
Sodium: 141 mmol/L (ref 134–144)

## 2019-06-02 LAB — TSH: TSH: 3.1 u[IU]/mL (ref 0.450–4.500)

## 2019-06-02 LAB — MAGNESIUM: Magnesium: 2 mg/dL (ref 1.6–2.3)

## 2019-06-03 ENCOUNTER — Other Ambulatory Visit: Payer: Self-pay | Admitting: Cardiovascular Disease

## 2019-06-04 ENCOUNTER — Telehealth: Payer: Self-pay

## 2019-06-04 NOTE — Telephone Encounter (Signed)
-----   Message from Sondra Barges, PA-C sent at 06/02/2019 10:46 AM EST ----- Magnesium at goal. Please see documented recommendations.

## 2019-06-04 NOTE — Telephone Encounter (Signed)
Spoke to patient to review results and POC.   Pt verbalized understanding and all questions were answered.   Advised pt to call for any further questions or concerns.

## 2019-06-04 NOTE — Telephone Encounter (Signed)
Stable, mild renal dysfunction Potassium at goal Random glucose normal Thyroid function normal  Recommendations: Await magnesium No changes in current medical therapy Follow-up as planned  Call attempted. No answer, no vm.

## 2019-06-15 ENCOUNTER — Encounter: Payer: Self-pay | Admitting: Family

## 2019-06-15 ENCOUNTER — Other Ambulatory Visit: Payer: Self-pay

## 2019-06-15 ENCOUNTER — Ambulatory Visit (INDEPENDENT_AMBULATORY_CARE_PROVIDER_SITE_OTHER): Payer: Medicare Other | Admitting: Family

## 2019-06-15 ENCOUNTER — Other Ambulatory Visit
Admission: RE | Admit: 2019-06-15 | Discharge: 2019-06-15 | Disposition: A | Discharge status: Home / Self Care | Payer: Medicare Other | Source: Ambulatory Visit | Attending: Family | Admitting: Family

## 2019-06-15 VITALS — BP 90/68 | HR 67 | Ht 71.0 in | Wt 203.0 lb

## 2019-06-15 DIAGNOSIS — I471 Supraventricular tachycardia: Secondary | ICD-10-CM

## 2019-06-15 DIAGNOSIS — I5022 Chronic systolic (congestive) heart failure: Secondary | ICD-10-CM | POA: Diagnosis present

## 2019-06-15 DIAGNOSIS — I110 Hypertensive heart disease with heart failure: Secondary | ICD-10-CM

## 2019-06-15 DIAGNOSIS — I10 Essential (primary) hypertension: Secondary | ICD-10-CM

## 2019-06-15 DIAGNOSIS — I428 Other cardiomyopathies: Secondary | ICD-10-CM

## 2019-06-15 LAB — BASIC METABOLIC PANEL WITH GFR
Anion gap: 5 (ref 5–15)
BUN: 16 mg/dL (ref 8–23)
CO2: 31 mmol/L (ref 22–32)
Calcium: 8.9 mg/dL (ref 8.9–10.3)
Chloride: 107 mmol/L (ref 98–111)
Creatinine, Ser: 1.21 mg/dL (ref 0.61–1.24)
GFR calc Af Amer: >60 mL/min
GFR calc non Af Amer: >60 mL/min
Glucose, Bld: 104 mg/dL — ABNORMAL HIGH (ref 70–99)
Potassium: 4 mmol/L (ref 3.5–5.1)
Sodium: 143 mmol/L (ref 135–145)

## 2019-06-15 NOTE — Progress Notes (Signed)
Office Visit    Patient Name: Steve Salazar Date of Encounter: 06/15/2019  Primary Care Provider:  Marygrace Drought, MD Primary Cardiologist:  Ida Rogue, MD Electrophysiologist:  None   Chief Complaint    EVO ADERMAN is a 69 y.o. male with a hx of HFrEF secondary to presumed an ICM, paroxysmal SVT, recent COVID-38 infection 03/2019, hypertensive heart disease presents today for follow-up of cardiomyopathy  Past Medical History    Past Medical History:  Diagnosis Date  . Arrhythmia    SVT verus A-fib  . CHF (congestive heart failure) ([place])   . Hypertension   . Hypertensive cardiomyopathy ([place])    EF 35%  . Renal insufficiency    Mild chronic   History reviewed. No pertinent surgical history.  Allergies  No Known Allergies  History of Present Illness    ERCIL Salazar is a 69 y.o. male with a hx of HFrEF secondary to presumed an ICM, paroxysmal SVT, recent COVID-19 infection.  2020, hypertensive heart disease.  He was last seen 06/01/2019 by Steve Faith, PA.  Admitted 2011 with dyspnea.  Notes indicate echo with LVEF 25 to 15%, diastolic dysfunction, mild to moderate MR, moderately dilated LA, normal RV systolic function and size.  Nuclear stress test 2011 with decreased perfusion in the basal to distal inferior walls with inferior apical region concerning for attenuation artifact as well as GI uptake artifact in the mid to distal inferior wall with EF 27%.  Overall read as low risk.  Holter monitor 06/2009 with 2 runs of SVT longest lasting 8 minutes unable to exclude atrial flutter.  Echo 10/2016 to evaluate cardiomyopathy with persistent low LV systolic function EF 40-08%, diffuse hypokinesis with akinesis of inferior myocardium, grade 1 diastolic dysfunction, mild mitral regurgitation, normal RV systolic function, normal PASP.  Reported wide-complex tachycardia with rate 116 bpm during study.  Outpatient cardiac monitoring showed NSR with episodes of SVT  lasting less than 1 minute in duration with a rate up to 180 bpm and no significant arrhythmia.  [facility] ED 03/2019 with dyspnea found of pneumonia certainly [id number].  Did not require admission.  Chest x-ray with right greater than left distal lower predominate airspace opacities with no pleural effusion or pneumothorax with no cardiomegaly.  Seen in cardiac follow-up 05/18/2019.  Was tolerating carvedilol Entresto.  And Lasix 40 mg several times per month as needed.  Metolazone discontinued card carvedilol titrated up, started on spironolactone.  Plan to optimize GDMT and repeat echo and likely [facility] with potential referral to EP if cardiomyopathy persisted.  In office visit 06/01/2019 his Lasix was changed to 40 mg twice per week due to dyspnea on exertion, abdominal distention.  Present today with his fiance.  Endorses he has a scale and a blood pressure cuff at home but has not been checking regularly.  Endorses improvement in his DOE and abdominal distention with Lasix twice weekly.  Reports intermittent heartburn but improvement since addition of Pepcid at office visit 06/01/2019.  His blood pressure is low in the office today.  No lightheadedness, dizziness.  Tells me he normally takes his Lasix in the morning prior to work but today when dizziness taken ~noon as he was off work.  Endorses he has not eaten much today.  Labs 06/01/19 K 4.2, creat 1.32, GFR 55, TSH 3.1  He asks about surgery for inguinal hernia today.  Tells me he was told that he needs it repaired and also some pain but has not met  the surgeon.  Recommended he talk with his PCP and see a surgeon and then we will determine cardiac clearance from that point.  Would likely need repeat echocardiogram but hesitant to schedule today without knowing date of surgery.  EKGs/Labs/Other Studies Reviewed:   The following studies were reviewed today: Echo 10/2016 Left ventricle: The cavity size was moderately dilated. Systolic    function was  severely reduced. The estimated ejection fraction    was in the range of 20% to 25%. Diffuse hypokinesis. Akinesis of    the inferior myocardium. Doppler parameters are consistent with    abnormal left ventricular relaxation (grade 1 diastolic    dysfunction).  - Mitral valve: There was mild regurgitation.  - Left atrium: The atrium was normal in size.  - Right ventricle: Systolic function was normal.  - Pulmonary arteries: Systolic pressure was within the normal    range.   EKG:  EKG is ordered today.  The ekg ordered today demonstrates SR with 1st degree AV block rate 67 bpm. Possible LA enlargement, left axis deviation, incomplete LBBB. Stable TWI in lateral leads.   Recent Labs: 06/01/2019: BUN 20; Creatinine, Ser 1.32; Magnesium 2.0; Potassium 4.2; Sodium 141; TSH 3.100  Recent Lipid Panel    Component Value Date/Time   CHOL 208 (H) 01/26/2013 0826   TRIG 80 01/26/2013 0826   HDL 63 01/26/2013 0826   CHOLHDL 3.3 01/26/2013 0826   LDLCALC 129 (H) 01/26/2013 0826    Home Medications   Current Meds  Medication Sig  . bimatoprost (LUMIGAN) 0.01 % SOLN Place 1 drop into both eyes at bedtime.  . carvedilol (COREG) 25 MG tablet Take 1 tablet (25 mg total) by mouth 2 (two) times daily with a meal.  . ENTRESTO 49-51 MG TAKE 1 TABLET BY MOUTH TWICE DAILY  . famotidine (PEPCID) 20 MG tablet Take 1 tablet (20 mg total) by mouth daily.  . furosemide (LASIX) 40 MG tablet Take 1 tablet (40 mg total) once daily on Monday and Thursday each week.  . metolazone (ZAROXOLYN) 5 MG tablet Take 1 tablet (5 mg total) by mouth daily as needed.  . naproxen sodium (ANAPROX) 220 MG tablet Take 220 mg by mouth as needed.  . sildenafil (REVATIO) 20 MG tablet TAKE 1 TABLET BY MOUTH THREE TIMES DAILY IF NEEDED  . spironolactone (ALDACTONE) 25 MG tablet Take 0.5 tablets (12.5 mg total) by mouth daily.  . tamsulosin (FLOMAX) 0.4 MG CAPS capsule Take 0.4 mg by mouth.      Review of Systems    Review of  Systems  Constitution: Negative for chills, fever and malaise/fatigue.  Cardiovascular: Negative for chest pain, dyspnea on exertion, leg swelling, near-syncope, orthopnea, palpitations and syncope.  Respiratory: Negative for cough, shortness of breath and wheezing.   Gastrointestinal: Positive for heartburn. Negative for nausea and vomiting.  Neurological: Negative for dizziness, light-headedness and weakness.   All other systems reviewed and are otherwise negative except as noted above.  Physical Exam    VS:  Ht '5\' 11"'  (1.803 m)   Wt 203 lb (92.1 kg)   BMI 28.31 kg/m  , BMI Body mass index is 28.31 kg/m. GEN: Well nourished, well developed, in no acute distress. HEENT: normal. Neck: Supple, no JVD, carotid bruits, or masses. Cardiac: RRR, no murmurs, rubs, or gallops. No clubbing, cyanosis, edema.  Radials/PT 2+ and equal bilaterally.  Respiratory:  Respirations regular and unlabored, clear to auscultation bilaterally. GI: Soft, nontender, nondistended, BS + x 4. MS: No  deformity or atrophy. Skin: Warm and dry, no rash. Neuro:  Strength and sensation are intact. Psych: Normal affect.  Assessment & Plan    1. HFrEF secondary to presumed NICM - Improvement in DOE and abdominal swlling since addition of Lasix 39m twice weekly. BMET today for monitoring. Continue GDMT Entresto 49-51 BID, Coreg 236mBID, Lasix 4061mwice per week, Spironolactone 5m4mily. Some concerns regarding medication cost, but cannot recall which one it was. Consider addition of dapagliflozin, but likely cost prohibitive. Plan for repeat echo in 3 months (approximately April or May 2020) for re-evaluation of LV systolic function after medication optimization. Low suspicion we will be able to increase medications further secondary to hypotension today. If cardiomyopathy persists, [facility] if EF <35% for consideration of ICD.  2. Paroxysmal SVT/PVC - Asymptomatic. EKG today without PVC.  06/01/19 TSH and magnesium normal. If PVCs persist, consider 3 day ZIO patch to evaluate burden.  3. Hypertensive heart disease - BP low today. Likely due to dehydration. Asymptomatic with no lightheadedness, dizziness. Continue present medications, as above. Encouraged to remain adequately hydrated and eat regular meals (he tells me low BP today due to not eating). He will check BP at home and report consistently low BP.  4. COVID 19 infection - Obtain echo spring 2021, as outlined above.  5. Inguinal hernia - Tells me he has a painful inguinal hernia. Encouraged him to follow up with PCP and seek surgical consult. Will await surgeon request prior to further preop clearance evaluation. Will likely need updated cardiovascular testing.  Disposition: Follow up in 1 month(s) with Dr. GollRockey SituAPP   CaitLoel Dubonnet 06/15/2019, 4:03 PM

## 2019-06-15 NOTE — Patient Instructions (Signed)
Follow up with your primary care physician and surgeon regarding the hernia surgery, then we will be able to have a better time frame in scheduling further cardiac testing.   Medication Instructions:  Your physician recommends that you continue on your current medications as directed. Please refer to the Current Medication list given to you today. STOP Metolazone.  *If you need a refill on your cardiac medications before your next appointment, please call your pharmacy*  Lab Work: Your physician recommends that you return for lab work in: TODAY- BMET as you leave from the [facility]  - Please go to the [facility]. You will check in at the front desk to the right as you walk into the atrium. Valet Parking is offered if needed. - No appointment needed. You may go any day between 7 am and 6 pm.  If you have labs (blood work) drawn today and your tests are completely normal, you will receive your results only by: Marland Kitchen MyChart Message (if you have MyChart) OR . A paper copy in the mail If you have any lab test that is abnormal or we need to change your treatment, we will call you to review the results.  Testing/Procedures: none  Follow-Up: At [facility], you and your health needs are our priority.  As part of our continuing mission to provide you with exceptional heart care, we have created designated Provider Care Teams.  These Care Teams include your primary Cardiologist (physician) and Advanced Practice Providers (APPs -  Physician Assistants and Nurse Practitioners) who all work together to provide you with the care you need, when you need it.  Your next appointment:   1 month(s)  The format for your next appointment:   In Person  Provider:    You may see [name], MD or one of the following Advanced Practice Providers on your designated Care Team:    Nicolasa Ducking, NP  Eula Listen, PA-C  Marisue Ivan, PA-C

## 2019-07-14 ENCOUNTER — Ambulatory Visit (INDEPENDENT_AMBULATORY_CARE_PROVIDER_SITE_OTHER): Payer: Medicare Other | Admitting: Cardiovascular Disease

## 2019-07-14 ENCOUNTER — Other Ambulatory Visit: Payer: Self-pay

## 2019-07-14 ENCOUNTER — Encounter: Payer: Self-pay | Admitting: Cardiovascular Disease

## 2019-07-14 VITALS — BP 124/82 | HR 65 | Ht 71.0 in | Wt 206.1 lb

## 2019-07-14 DIAGNOSIS — I471 Supraventricular tachycardia: Secondary | ICD-10-CM

## 2019-07-14 MED ORDER — FUROSEMIDE 40 MG PO TABS: ORAL_TABLET | ORAL | 0 refills | Status: DC

## 2019-07-14 MED ORDER — SPIRONOLACTONE 25 MG PO TABS: 12.5000 mg | ORAL_TABLET | Freq: Every day | ORAL | 3 refills | Status: DC

## 2019-07-14 MED ORDER — CARVEDILOL 25 MG PO TABS: 25.0000 mg | ORAL_TABLET | Freq: Two times a day (BID) | ORAL | 3 refills | Status: DC

## 2019-07-14 MED ORDER — ENTRESTO 49-51 MG PO TABS: 1.0000 | ORAL_TABLET | Freq: Two times a day (BID) | ORAL | 0 refills | Status: DC

## 2019-07-14 NOTE — Patient Instructions (Addendum)
Medication Instructions:  No changes Refills on everything, #90, refill x 3  If you need a refill on your cardiac medications before your next appointment, please call your pharmacy.    Lab work: No new labs needed   If you have labs (blood work) drawn today and your tests are completely normal, you will receive your results only by: Marland Kitchen MyChart Message (if you have MyChart) OR . A paper copy in the mail If you have any lab test that is abnormal or we need to change your treatment, we will call you to review the results.   Testing/Procedures: No new testing needed   Follow-Up: At [facility], you and your health needs are our priority.  As part of our continuing mission to provide you with exceptional heart care, we have created designated Provider Care Teams.  These Care Teams include your primary Cardiologist (physician) and Advanced Practice Providers (APPs -  Physician Assistants and Nurse Practitioners) who all work together to provide you with the care you need, when you need it.  . You will need a follow up appointment in 6 months   . Providers on your designated Care Team:   . Nicolasa Ducking, NP . Eula Listen, PA-C . Marisue Ivan, PA-C  Any Other Special Instructions Will Be Listed Below (If Applicable).  For educational health videos Log in to : [email or web address] Or : [email or web address], password : [email or web address]  COVID-19 Vaccine Information can be found at: [email or web address] For questions related to vaccine distribution or appointments, please email [email or web address] or call [phone].

## 2019-07-14 NOTE — Progress Notes (Signed)
Cardiology Office Note  Date:  07/14/2019   ID:  Steve Salazar, DOB March 10, 1951, MRN [id number]  PCP:  Steve Drought, MD   Chief Complaint  Patient presents with  . Other    1 month follow up. Patient c.o some chest pain and SOB. Meds reviewed vebrally with patient.     HPI:  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   In follow-up today reports he is doing relatively well but is reporting having some SOB, orthopnea, PND Only taking Lasix 2x a week  Reports that he had covid infection 03/2019 Wife also had infection Thinks he got it from work  Continues to have pain in his groin from hernia Has been seen by surgery at [facility] Would like to proceed with surgery when possible given the discomfort  compliant with hisLasix, carvedilol, entresto, spironolactone  OT continues to work full-time  EKG personally reviewed by myself on todays visit Shows normal sinus rhythm rate 65 bpm T-wave abnormality V4 to V6 1 and aVL, left axis deviation No change compared to June 2018  Other past medical history April 2013 was in the emergency room last week for shortness of breath, abdominal bloating He was given Lasix IV with improvement of his symptoms Lab work reviewed with him, Potassium in the hospital for 4.0, BNP 1200,  chest x-ray mild interstitial edema  Previous Stress test showed severe cardiomyopathy with ejection fraction 25%, dilated left ventricle, low risk scan. Initial BNP on arrival to the hospital was 1342 He denied any history of drug or alcohol use, no cocaine. Previous lab work showing Total cholesterol 167, LDL 74, HDL 77.   Echo showing moderately dilated left ventricle, ejection fraction 25 to 28%, diastolic relaxation abnormality, normal RV  size and function, mild to moderate mitral regurgitation, mild to moderately dilated left atrium  PMH:   has a past medical history of Arrhythmia, CHF (congestive heart failure) ([place]), Hypertension, Hypertensive cardiomyopathy ([place]), and Renal insufficiency.  PSH:   History reviewed. No pertinent surgical history.  Current Outpatient Medications  Medication Sig Dispense Refill  . bimatoprost (LUMIGAN) 0.01 % SOLN Place 1 drop into both eyes at bedtime.    . carvedilol (COREG) 25 MG tablet Take 1 tablet (25 mg total) by mouth 2 (two) times daily with a meal. 180 tablet 3  . ENTRESTO 49-51 MG TAKE 1 TABLET BY MOUTH TWICE DAILY 60 tablet 0  . famotidine (PEPCID) 20 MG tablet Take 1 tablet (20 mg total) by mouth daily. 30 tablet 11  . furosemide (LASIX) 40 MG tablet Take 1 tablet (40 mg total) once daily on Monday and Thursday each week. 30 tablet 6  . naproxen sodium (ANAPROX) 220 MG tablet Take 220 mg by mouth as needed.    . sildenafil (REVATIO) 20 MG tablet TAKE 1 TABLET BY MOUTH THREE TIMES DAILY IF NEEDED 270 tablet 0  . spironolactone (ALDACTONE) 25 MG tablet Take 0.5 tablets (12.5 mg total) by mouth daily. 45 tablet 3  . tamsulosin (FLOMAX) 0.4 MG CAPS capsule Take 0.4 mg by mouth.     No current facility-administered medications for this visit.     Allergies:   Patient has no known allergies.   Social History:  The patient  reports that he has quit smoking. His smoking use included cigarettes. He  has never used smokeless tobacco. He reports current alcohol use of about 2.0 standard drinks of alcohol per week. He reports that he does not use drugs.   Family History:   family history includes Heart attack in his father.    Review of Systems: Review of Systems  Constitutional: Negative.   HENT: Negative.   Respiratory: Positive for shortness of breath.   Cardiovascular: Negative.   Gastrointestinal: Negative.        Right groin pain  Musculoskeletal: Negative.   Neurological:  Negative.   Psychiatric/Behavioral: Negative.   All other systems reviewed and are negative.    PHYSICAL EXAM: VS:  BP 124/82 (BP Location: Left Arm, Patient Position: Sitting, Cuff Size: Normal)   Pulse 65   Ht 5\' 11"  (1.803 m)   Wt 206 lb 2 oz (93.5 kg)   SpO2 95%   BMI 28.75 kg/m  , BMI Body mass index is 28.75 kg/m. Constitutional:  oriented to person, place, and time. No distress.  HENT:  Head: Grossly normal Eyes:  no discharge. No scleral icterus.  Neck: No JVD, no carotid bruits  Cardiovascular: Regular rate and rhythm, no murmurs appreciated Pulmonary/Chest: Clear to auscultation bilaterally, no wheezes or rails Abdominal: Soft.  no distension.  no tenderness.  Musculoskeletal: Normal range of motion Neurological:  normal muscle tone. Coordination normal. No atrophy Skin: Skin warm and dry Psychiatric: normal affect, pleasant   Recent Labs: 06/01/2019: Magnesium 2.0; TSH 3.100 06/15/2019: BUN 16; Creatinine, Ser 1.21; Potassium 4.0; Sodium 143    Lipid Panel Lab Results  Component Value Date   CHOL 208 (H) 01/26/2013   HDL 63 01/26/2013   LDLCALC 129 (H) 01/26/2013   TRIG 80 01/26/2013      Wt Readings from Last 3 Encounters:  07/14/19 206 lb 2 oz (93.5 kg)  06/15/19 203 lb (92.1 kg)  06/01/19 203 lb (92.1 kg)       ASSESSMENT AND PLAN:  Acute on chronic systolic CHF (congestive heart failure) (HCC) -  Continue Entresto, Coreg, spironolactone at current doses Only taking Lasix twice a week He has been having mild orthopnea PND symptoms, recommended he take Lasix 3 days in a row when he has the symptoms He is reluctant to take extra Lasix during his workdays, recommend he take extra Lasix on his weekends  Dilated cardiomyopathy Presumed to be nonischemic by her stress testing showing no ischemia -Medications as above, recommend extra Lasix  Preop cardiovascular evaluation for groin hernia surgery Acceptable risk, would limit IV fluids during the  perioperative period Recommended he take extra Lasix to ensure a euvolemic state prior to the procedure  Essential hypertension -  Blood pressure is well controlled on today's visit. No changes made to the medications.  SVT/ PSVT/ PAT - Plan: EKG 12-Lead Denies any tachycardia concerning for arrhythmia stable    Total encounter time more than 25 minutes  Greater than 50% was spent in counseling and coordination of care with the patient   Disposition:   F/U  6 months   No orders of the defined types were placed in this encounter.    Signed, 07/30/19, M.D., Ph.D. 07/14/2019  [facility] [place], [place]

## 2019-11-27 ENCOUNTER — Telehealth: Payer: Self-pay | Admitting: Cardiovascular Disease

## 2019-11-27 NOTE — Telephone Encounter (Signed)
Pt c/o medication issue:  1. Name of Medication: Not sure   2. How are you currently taking this medication (dosage and times per day)?  Not sure   3. Are you having a reaction (difficulty breathing--STAT)? No but per sister legs swelling and sob   4. What is your medication issue?   Patient not sure which med but it may have been a recent change and it cost too much  Patient not clear with sister about meds taken and may need assistance discussing medication list  Per sister patient would also benefit from a call.   She thinks he may be in or be having chf issues or symptoms and she wants to prevent this and also optimize him for needed hernia surgery

## 2019-11-27 NOTE — Telephone Encounter (Signed)
Spoke with patient and reviewed medications and he is unable to afford his Entresto. Offered to fill out patient assistance application for that medication and advised that if he would come next Wednesday to sign it then that would be great. He was appreciative for the assistance and confirmed he would come sometime that day to sign. Confirmed medications and instructions. He verbalized understanding of our conversation, agreeable with plan, and no further questions at this time. Will call his sister to review this information.

## 2019-11-27 NOTE — Telephone Encounter (Signed)
Spoke with patients sister to review that he would come by the office to sign patient assistance application and that we would get that faxed for him to hopefully get some help with that cost. Also reviewed other medications and she verbalized understanding with no further questions at this time.

## 2019-12-10 ENCOUNTER — Other Ambulatory Visit: Payer: Self-pay

## 2019-12-10 ENCOUNTER — Encounter: Payer: Self-pay | Admitting: Physician Assistant

## 2019-12-10 ENCOUNTER — Ambulatory Visit (INDEPENDENT_AMBULATORY_CARE_PROVIDER_SITE_OTHER): Payer: Medicare Other | Admitting: Physician Assistant

## 2019-12-10 VITALS — BP 100/80 | HR 64 | Ht 71.0 in | Wt 204.5 lb

## 2019-12-10 DIAGNOSIS — I428 Other cardiomyopathies: Secondary | ICD-10-CM

## 2019-12-10 DIAGNOSIS — Z01810 Encounter for preprocedural cardiovascular examination: Secondary | ICD-10-CM

## 2019-12-10 DIAGNOSIS — I10 Essential (primary) hypertension: Secondary | ICD-10-CM

## 2019-12-10 DIAGNOSIS — I5022 Chronic systolic (congestive) heart failure: Secondary | ICD-10-CM | POA: Diagnosis not present

## 2019-12-10 DIAGNOSIS — Z8616 Personal history of COVID-19: Secondary | ICD-10-CM

## 2019-12-10 DIAGNOSIS — I471 Supraventricular tachycardia, unspecified: Secondary | ICD-10-CM

## 2019-12-10 DIAGNOSIS — I110 Hypertensive heart disease with heart failure: Secondary | ICD-10-CM

## 2019-12-10 DIAGNOSIS — N189 Chronic kidney disease, unspecified: Secondary | ICD-10-CM

## 2019-12-10 MED ORDER — SPIRONOLACTONE 25 MG PO TABS: 12.5000 mg | ORAL_TABLET | Freq: Every day | ORAL | 3 refills | Status: DC

## 2019-12-10 NOTE — Patient Instructions (Signed)
Medication Instructions:  Your physician recommends that you continue on your current medications as directed. Please refer to the Current Medication list given to you today.  *If you need a refill on your cardiac medications before your next appointment, please call your pharmacy*   Lab Work: None ordered If you have labs (blood work) drawn today and your tests are completely normal, you will receive your results only by: Marland Kitchen MyChart Message (if you have MyChart) OR . A paper copy in the mail If you have any lab test that is abnormal or we need to change your treatment, we will call you to review the results.   Testing/Procedures: Echo  Please return to [facility] on ______________ at _______________ AM/PM for an Echocardiogram. Your physician has requested that you have an echocardiogram. Echocardiography is a painless test that uses sound waves to create images of your heart. It provides your doctor with information about the size and shape of your heart and how well your heart's chambers and valves are working. This procedure takes approximately one hour. There are no restrictions for this procedure. Please note; depending on visual quality an IV may need to be placed.   [facility] MYOVIEW  Your caregiver has ordered a Stress Test with nuclear imaging. The purpose of this test is to evaluate the blood supply to your heart muscle. This procedure is referred to as a "Non-Invasive Stress Test." This is because other than having an IV started in your vein, nothing is inserted or "invades" your body. Cardiac stress tests are done to find areas of poor blood flow to the heart by determining the extent of coronary artery disease (CAD). Some patients exercise on a treadmill, which naturally increases the blood flow to your heart, while others who are  unable to walk on a treadmill due to physical limitations have a pharmacologic/chemical stress agent called Lexiscan . This medicine will mimic  walking on a treadmill by temporarily increasing your coronary blood flow.   Please note: these test may take anywhere between 2-4 hours to complete  PLEASE REPORT TO [facility] MEDICAL MALL ENTRANCE  THE VOLUNTEERS AT THE FIRST DESK WILL DIRECT YOU WHERE TO GO  Date of Procedure:_____________________________________  Arrival Time for Procedure:______________________________  Instructions regarding medication:    __x__:  Hold betablocker(s) night before procedure and morning of procedure (coreg)  __x__:  Hold other medications as follows:_________hold lasix the morning of_________________________________________  PLEASE NOTIFY THE OFFICE AT LEAST 24 HOURS IN ADVANCE IF YOU ARE UNABLE TO KEEP YOUR APPOINTMENT.  [phone] AND  PLEASE NOTIFY NUCLEAR MEDICINE AT [facility] AT LEAST 24 HOURS IN ADVANCE IF YOU ARE UNABLE TO KEEP YOUR APPOINTMENT. [phone]  How to prepare for your Myoview test:  1. Do not eat or drink after midnight 2. No caffeine for 24 hours prior to test 3. No smoking 24 hours prior to test. 4. Your medication may be taken with water.  If your doctor stopped a medication because of this test, do not take that medication. 5. Ladies, please do not wear dresses.  Skirts or pants are appropriate. Please wear a short sleeve shirt. 6. No perfume, cologne or lotion. 7. Wear comfortable walking shoes. No heels!   Follow-Up: At [facility], you and your health needs are our priority.  As part of our continuing mission to provide you with exceptional heart care, we have created designated Provider Care Teams.  These Care Teams include your primary Cardiologist (physician) and Advanced Practice Providers (APPs -  Physician Assistants and Nurse Practitioners) who all work together to provide you with the care you need, when you need it.  We recommend signing up for the patient portal called "MyChart".  Sign up information is provided on this After Visit Summary.  MyChart is used to  connect with patients for Virtual Visits (Telemedicine).  Patients are able to view lab/test results, encounter notes, upcoming appointments, etc.  Non-urgent messages can be sent to your provider as well.   To learn more about what you can do with MyChart, go to [email or web address].    Your next appointment:   6-8 week(s)  The format for your next appointment:   In Person  Provider:     You may see [name], MD or Marisue Ivan, PA-C  *Please have your surgeon send pre op cardiac clearance form to [phone]

## 2019-12-10 NOTE — Progress Notes (Signed)
Office Visit    Patient Name: Steve Salazar Date of Encounter: 12/10/2019  Primary Care Provider:  Marygrace Drought, MD Primary Cardiologist:  Ida Rogue, MD  Chief Complaint    Chief Complaint  Patient presents with   office visit    6 month F/U; Meds verbally reviewed with patient.    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   Past Medical History    Past Medical History:  Diagnosis Date   Arrhythmia    SVT verus A-fib   CHF (congestive heart failure) (HCC)    Hypertension    Hypertensive cardiomyopathy (HCC)    EF 35%   Renal insufficiency    Mild chronic   History reviewed. No pertinent surgical history.  Allergies  No Known Allergies  History of Present Illness    Steve Salazar is a 69 y.o. male with PMH as above. He has a history of HFrEF, NICM, and recent COVID-19 infection.   He was admitted in 2011 with [place]. Echo LVEF 25-30%, DD, mild to moderate MR, moderate LAE. He underwent nl stress test in 2011 with decreased perfusion noted in the basal to distal inferior walls and with inferior apical region concerning for attenuation artifact / GI uptake in the mid to distal inferior wall. EF 27%. Overalll low risk. Holter monitor 2011 showed 2 runs of SVT, lasting 8 minutes and unable to exclude atrial flutter.  Echo 10/2016 showed persistently low LV SF 20 to 25%, diffuse hypokinesis with akinesis of inferior myocardium, G1 DD, mild MR. Wide-complex tachycardia was reported with rate 116 bpm during study.  Outpatient cardiac monitoring showed NSR with episodes of SVT lasting less than 1 minute in duration and rate up to 180 bpm.  No significant arrhythmia.  [facility] ED 03/2019 admission due to dyspnea found pneumonia with COVID-19 diagnosis.  He did not require admission.  Chest x-ray showed right greater than left distal lower predominant airspace opacities.    He was seen in clinic 05/18/2019 and was tolerating carvedilol and Entresto.  He was taking Lasix 40 mg several times per month as needed.  Metolazone was discontinued and carvedilol uptitrated.  He was started on spironolactone.  Plan was to optimize GDMT with repeat echo and likely [facility] with potential referral to EP if cardiomyopathy persisted.  He was seen 06/01/2019 with Lasix changed to 40 mg twice per week due to DOE and abdominal distention.  When seen 06/15/19, he reported an upcoming inguinal hernia surgery.  He had not yet met the surgeon.  Recommendation was to talk to his PCP and see a surgeon, at which time we would determine cardiac clearance.  It was noted that he would likely need a repeat echocardiogram; however, hesitation was noted regarding ordering this echo without knowing the date of surgery.  He noted some concerns regarding medication cost but cannot recall which medication this was associated with at that time.  Given his hypertension, escalation of GDMT deferred.  It was noted that, if cardiomyopathy persisted, R/LHC and EP referral if EF less than 35% should be considered to evaluate for consideration of ICD.  Also noted was consideration of 3 days ZIO monitoring to evaluate burden of PVCs.  He was last seen by Dr. Rockey Situ 07/14/2019.  At that time, he reported that he was doing relatively well.  He did note some SOA, orthopnea, and PND.  He  was taking Lasix twice a week.  He continued to note groin pain from hernia.  He had been seen by surgery at [facility].  He indicated that he would like to proceed with surgery.  Given his symptoms, he was thought to be mildly volume overloaded.  Recommendation was to take Lasix for 3 days in a row when experiencing symptoms.  He noted hesitation to take Lasix during his workdays.  Recommendation was to take an extra Lasix on weekends.  Of note, preoperative cardiovascular evaluation was completed during this visit.  Patient was ruled acceptable risk.   Recommendations were to limit IV fluids during the perioperative period.  It was also recommended he take an extra Lasix to ensure euvolemic state prior to the procedure.  Today, 12/11/2019, he returns to clinic and states that he requires complete cardiac testing and documentation of this recent testing and subsequent note with cardiac clearance provided up front before his surgeon will even establish with him and set a date for inguinal hernia repair given his cardiac history as above.  No preoperative request available on review of preop pool.   Recommendation today was that surgeon still send a preop request to our clinic as we cannot fully evaluate with details of procedure - fax number for our clinic thus provided for pt today.    During today's visit, he reports resolution of SOB, orthopnea, and PND with lasix. He denies chest pain, palpitations, dyspnea, pnd, orthopnea, n, v, dizziness, syncope, edema, weight gain, or early satiety.  He does note occasionally eating salty food, especially chips.  He denies caffeine.  Once or twice a week, he reports alcohol use - he drinks two shot glasses of Hennessy. He has reportedly been out of his spironolactone for 1 month with BP well controlled today on exam.  Recommendations as below.   Home Medications    Prior to Admission medications   Medication Sig Start Date End Date Taking? Authorizing Provider  bimatoprost (LUMIGAN) 0.01 % SOLN Place 1 drop into both eyes at bedtime.   Yes [provider]  carvedilol (COREG) 25 MG tablet Take 1 tablet (25 mg total) by mouth 2 (two) times daily with a meal. 07/14/19  Yes Gollan, Kathlene November, MD  famotidine (PEPCID) 20 MG tablet Take 1 tablet (20 mg total) by mouth daily. 06/01/19  Yes Dunn, Areta Haber, PA-C  furosemide (LASIX) 40 MG tablet Take 1 tablet (40 mg total) once daily on Monday and Thursday each week. 07/14/19  Yes Minna Merritts, MD  naproxen sodium (ANAPROX) 220 MG tablet Take 220 mg by mouth as  needed.   Yes [provider]  sacubitril-valsartan (ENTRESTO) 49-51 MG Take 1 tablet by mouth 2 (two) times daily. 07/14/19  Yes Gollan, Kathlene November, MD  sildenafil (REVATIO) 20 MG tablet TAKE 1 TABLET BY MOUTH THREE TIMES DAILY IF NEEDED 10/28/17  Yes Gollan, Kathlene November, MD  tamsulosin (FLOMAX) 0.4 MG CAPS capsule Take 0.4 mg by mouth daily.    Yes [provider]  spironolactone (ALDACTONE) 25 MG tablet Take 0.5 tablets (12.5 mg total) by mouth daily. 12/10/19 03/09/20  Marrianne Mood D, PA-C    Review of Systems    He denies chest pain, palpitations, dyspnea, pnd, orthopnea, n, v, dizziness, syncope, edema, weight gain, or early satiety.   All other systems reviewed and are otherwise negative except as noted above.  Physical Exam    VS:  BP 100/80 (BP Location: Left Arm, Patient Position: Sitting, Cuff Size:  Normal)    Pulse 64    Ht '5\' 11"'  (1.803 m)    Wt 204 lb 8 oz (92.8 kg)    SpO2 97%    BMI 28.52 kg/m  , BMI Body mass index is 28.52 kg/m. GEN: Well nourished, well developed, in no acute distress. HEENT: normal. Neck: Supple, no JVD, carotid bruits, or masses. Cardiac: RRR, no murmurs, rubs, or gallops. No clubbing, cyanosis, edema.  Radials/DP/PT 2+ and equal bilaterally.  Respiratory: CTAB GI: soft, nondistended, BS + x 4. MS: no deformity or atrophy. Skin: warm and dry, no rash. Neuro:  Strength and sensation are intact. Psych: Normal affect.  Accessory Clinical Findings    ECG personally reviewed by me today -SR with first-degree AV block, left axis deviation, QRS is widened to left bundle branch block, 64 bpm, PR interval 216 ms, QTC 449 ms, poor R wave progression in II, III, avF, and precordial leads, TWI noted in lateral leads with ST depression, minor ST elevation in II, avF, V3-V4- no acute changes.  VITALS Reviewed today   Temp Readings from Last 3 Encounters:  11/05/16 98 F (36.7 C) (Oral)   BP Readings from Last 3 Encounters:  12/10/19 100/80   07/14/19 124/82  06/15/19 90/68   Pulse Readings from Last 3 Encounters:  12/10/19 64  07/14/19 65  06/15/19 67    Wt Readings from Last 3 Encounters:  12/10/19 204 lb 8 oz (92.8 kg)  07/14/19 206 lb 2 oz (93.5 kg)  06/15/19 203 lb (92.1 kg)     LABS  reviewed today   Lab Results  Component Value Date   WBC 6.0 11/05/2016   HGB 13.2 11/05/2016   HCT 40.7 11/05/2016   MCV 87.3 11/05/2016   PLT 198 11/05/2016   Lab Results  Component Value Date   CREATININE 1.21 06/15/2019   BUN 16 06/15/2019   NA 143 06/15/2019   K 4.0 06/15/2019   CL 107 06/15/2019   CO2 31 06/15/2019   Lab Results  Component Value Date   ALT 41 07/27/2014   AST 38 07/27/2014   ALKPHOS 59 07/27/2014   BILITOT 1.0 07/27/2014   Lab Results  Component Value Date   CHOL 208 (H) 01/26/2013   HDL 63 01/26/2013   LDLCALC 129 (H) 01/26/2013   TRIG 80 01/26/2013   CHOLHDL 3.3 01/26/2013    No results found for: HGBA1C Lab Results  Component Value Date   TSH 3.100 06/01/2019     STUDIES/PROCEDURES reviewed today   Echo 10/2016 Left ventricle: The cavity size was moderately dilated. Systolic  function was severely reduced. The estimated ejection fraction  was in the range of 20% to 25%. Diffuse hypokinesis. Akinesis of  the inferior myocardium. Doppler parameters are consistent with  abnormal left ventricular relaxation (grade 1 diastolic  dysfunction).  - Mitral valve: There was mild regurgitation.  - Left atrium: The atrium was normal in size.  - Right ventricle: Systolic function was normal.  - Pulmonary arteries: Systolic pressure was within the normal  range.   Assessment & Plan    Preoperative cardiac evaluation, Inguinal hernia --Ongoing pain from inguinal hernia. No current CP or symptoms concerning for angina and euvolemic on exam.  --No preoperative surgical request provided given surgeon reportedly wants documentation of full testing and clearance before pt can  even establish with him and set a surgery date. Explained to pt that we still need preop request, as this provides [place] with information needed to complete  his cardiac evaluation. Provided with our fax number.  --No current s/sx concerning for angina and euvolemic on exam. DOE and abdominal swelling noted at past visits with increased frequency of lasix 76m BID resulting in alleviation of sx. Given cardiac history, surgeon has requested full workup, which is reasonable given EKG and HPI above. Will obtain Lexiscan Myoview for further ischemic evaluation. Given COVID-19 and history of reduced EF, as well as missed repeat limited echo (recommended repeat echo to reevaluate EF in April or May 2021 but not obtained), will repeat an echo to reevaluate systolic function and rule out any acute structural changes or significant valvular dz. Recommend full echo given upcoming procedure, rather than a limited. Continue medical management. Escalation of GDMT precluded by ongoing hypotension. If EF still reduced below 35% on repeat echo, will need to refer to EP for consideration of ICD. Further preoperative recommendations pending workup above and receipt of actual preoperative request to allow for completion of preoperative evaluation and calculations.   Paroxysmal SVT/PVCs --Asymptomatic. EKG without significant ectopy or arrhythmia. Continue medical management.  Hypertensive heart disease  Chronic combined systolic and diastolic heart failure, EF 20-25% --Denies any s/sx of worsening HF today with most recent echo above. Alleviation of previous sx of volume overload reported with escalation of lasix to twice weekly. Previous echo as copied above with EF 20-25%, G1DD, mild MR.  --Repeat echo to reassess EF on GDMT. Previous office visit with recommendation for 3 month repeat echo though echo never obtained. Given upcoming surgery, recommend now obtain full echo over that of limited echo to reassesses EF, structure,  valves. If EF still reduced below 35% on repeat echo, will need to refer to EP for consideration of ICD. --Asymptomatic with hypotension today.  He has not been taking his spironolactone with BP already 100/80; therefore, recommend he monitor his BP at home and continue to hold spironolactone if BP drops further on spironolactone at home and to prevent prerenal AKI.  Ensure adequately hydrated but with total fluids under 2L and low salt diet.  Escalation of GDMT precluded by hypotension.   Medication changes: None - may need to hold spironolactone if continued low home BP Labs ordered: None Studies / Imaging ordered: Lexiscan Myoview, full echo Future considerations: ICD if EF still below 35% on GDMT Disposition: RTC 6 to 8 weeks. Need preop request to complete preop cardiac evaluation.  JArvil Chaco PA-C 12/10/2019

## 2019-12-16 ENCOUNTER — Telehealth: Payer: Self-pay | Admitting: *Deleted

## 2019-12-16 NOTE — Telephone Encounter (Signed)
Spoke with patients sister and reviewed that I have application for assistance filled out and only need his signature. She reports that he has stress test tomorrow at [facility] and advised that he could come by office after that to sign then I could get it faxed off. She verbalized understanding, will relay message to him, and had no further questions at this time.

## 2019-12-16 NOTE — Telephone Encounter (Signed)
-----   Message from Bryna Colander, RN sent at 11/27/2019  4:14 PM EDT ----- Application and call sister

## 2019-12-17 ENCOUNTER — Other Ambulatory Visit: Payer: Self-pay

## 2019-12-17 ENCOUNTER — Encounter
Admission: RE | Admit: 2019-12-17 | Discharge: 2019-12-17 | Disposition: A | Discharge status: Home / Self Care | Payer: Medicare Other | Source: Ambulatory Visit | Attending: Physician Assistant | Admitting: Physician Assistant

## 2019-12-17 DIAGNOSIS — I5022 Chronic systolic (congestive) heart failure: Secondary | ICD-10-CM | POA: Insufficient documentation

## 2019-12-17 MED ORDER — REGADENOSON 0.4 MG/5ML IV SOLN
0.4000 mg | Freq: Once | INTRAVENOUS | Status: AC
  Administered 2019-12-17: 0.4 mg via INTRAVENOUS

## 2019-12-17 MED ORDER — TECHNETIUM TC 99M TETROFOSMIN IV KIT
10.0000 | PACK | Freq: Once | INTRAVENOUS | Status: AC | PRN
  Administered 2019-12-17: 10.38 via INTRAVENOUS

## 2019-12-17 MED ORDER — TECHNETIUM TC 99M TETROFOSMIN IV KIT
31.6780 | PACK | Freq: Once | INTRAVENOUS | Status: AC | PRN
  Administered 2019-12-17: 31.678 via INTRAVENOUS

## 2019-12-18 LAB — NM MYOCAR MULTI W/SPECT W/WALL MOTION / EF
Estimated workload: 1 METS
Exercise duration (min): 0 min
Exercise duration (sec): 0 s
LV dias vol: 389 mL (ref 62–150)
LV sys vol: 304 mL
MPHR: 151 {beats}/min
Peak HR: 86 {beats}/min
Percent HR: 56 %
Rest HR: 75 {beats}/min
SDS: 3
SRS: 15
SSS: 9
TID: 1.07

## 2019-12-22 ENCOUNTER — Other Ambulatory Visit: Payer: Self-pay

## 2019-12-22 ENCOUNTER — Telehealth: Payer: Self-pay

## 2019-12-22 ENCOUNTER — Ambulatory Visit (INDEPENDENT_AMBULATORY_CARE_PROVIDER_SITE_OTHER): Payer: Medicare Other

## 2019-12-22 DIAGNOSIS — I5022 Chronic systolic (congestive) heart failure: Secondary | ICD-10-CM | POA: Diagnosis not present

## 2019-12-22 LAB — ECHOCARDIOGRAM COMPLETE
AR max vel: 3.32 cm2
AV Area VTI: 3.24 cm2
AV Area mean vel: 2.9 cm2
AV Mean grad: 3 mmHg
AV Peak grad: 4.2 mmHg
Ao pk vel: 1.03 m/s
Area-P 1/2: 8.82 cm2
Calc EF: 24.4 %
P 1/2 time: 369 msec
S' Lateral: 6.8 cm
Single Plane A2C EF: 16.5 %
Single Plane A4C EF: 31 %

## 2019-12-22 NOTE — Telephone Encounter (Signed)
Per fax received from [employer] patient has been approved for assistance for the remainder of the calendar year at no cost.

## 2019-12-22 NOTE — Telephone Encounter (Signed)
Spoke with patient and reviewed outcome of assistance. He verbalized understanding with no further questions. Confirmed his appointment today for ultrasound and he acknowledged the time as well. He was appreciative for the help with no further needs at this time.

## 2019-12-25 ENCOUNTER — Telehealth: Payer: Self-pay

## 2019-12-25 NOTE — Telephone Encounter (Signed)
Patient is returning your call.  

## 2019-12-25 NOTE — Telephone Encounter (Signed)
-----   Message from Lennon Alstrom, PA-C sent at 12/24/2019  4:36 PM EDT ----- Please let Steve Salazar know his pump function still low at 20 to 25%. The bottom left chamber of his heart is not moving well (also seen in 2018). His heart walls are slightly thicker and more stiff than in 2018. We will continue to control his BP. His heart chambers are all larger than normal size. He has a moderately leaky mitral, aortic, and pulmonary valve, which we will monitor.   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 He should avoid fluoroquinolones (class of antibiotic) and heavy lifting. I would recommend he start ASA 81mg  and a statin at his next visit with Dr. after getting baseline labs.   Given his echo, he should be referred to EP for consideration of ICD if agreeable. Dr. Mariah Milling can also speak with him about this at his upcoming visit as well if preferred.

## 2019-12-25 NOTE — Telephone Encounter (Signed)
Called to give the patient echo results. Unable to lmtcb. Patients voicemail is not set up.

## 2019-12-25 NOTE — Telephone Encounter (Signed)
DPR on file. Patient sister [name] call back stating that the patient did not under stand the results given. Results reviewed again with the [employer] [name] verbalized understanding and voiced appreciation for the assistance.

## 2019-12-25 NOTE — Telephone Encounter (Signed)
Patient made aware of echo results and Marisue Ivan, PA recommendation.  Patient sts that he was on Asa in the past and it was stopped due to bleeding. Patient has and appt with his primary cardiologist Dr. Mariah Milling on 01/12/20. Adv the patient that Dr. Mariah Milling can review his echo with him in detail and give his recommendation regarding restarting Asa (risk vs benefit). Adv the patient that Dr. Mariah Milling can address starting a statin and the need for a EP referral.  Patient verbalized understanding and voiced appreciation for the call.

## 2019-12-31 ENCOUNTER — Other Ambulatory Visit: Payer: Medicare Other

## 2020-01-12 ENCOUNTER — Ambulatory Visit (INDEPENDENT_AMBULATORY_CARE_PROVIDER_SITE_OTHER): Payer: Medicare Other | Admitting: Cardiovascular Disease

## 2020-01-12 ENCOUNTER — Other Ambulatory Visit: Payer: Self-pay

## 2020-01-12 ENCOUNTER — Other Ambulatory Visit
Admission: RE | Admit: 2020-01-12 | Discharge: 2020-01-12 | Disposition: A | Discharge status: Home / Self Care | Payer: Medicare Other | Source: Ambulatory Visit | Attending: Cardiovascular Disease | Admitting: Cardiovascular Disease

## 2020-01-12 ENCOUNTER — Encounter: Payer: Self-pay | Admitting: Cardiovascular Disease

## 2020-01-12 VITALS — BP 70/50 | HR 80 | Ht 71.0 in | Wt 201.4 lb

## 2020-01-12 DIAGNOSIS — I10 Essential (primary) hypertension: Secondary | ICD-10-CM | POA: Insufficient documentation

## 2020-01-12 DIAGNOSIS — I110 Hypertensive heart disease with heart failure: Secondary | ICD-10-CM

## 2020-01-12 DIAGNOSIS — I5022 Chronic systolic (congestive) heart failure: Secondary | ICD-10-CM | POA: Diagnosis not present

## 2020-01-12 DIAGNOSIS — I428 Other cardiomyopathies: Secondary | ICD-10-CM | POA: Diagnosis not present

## 2020-01-12 DIAGNOSIS — I471 Supraventricular tachycardia: Secondary | ICD-10-CM | POA: Diagnosis not present

## 2020-01-12 LAB — BASIC METABOLIC PANEL
Anion gap: 7 (ref 5–15)
BUN: 18 mg/dL (ref 8–23)
CO2: 30 mmol/L (ref 22–32)
Calcium: 9.2 mg/dL (ref 8.9–10.3)
Chloride: 101 mmol/L (ref 98–111)
Creatinine, Ser: 1.32 mg/dL — ABNORMAL HIGH (ref 0.61–1.24)
GFR calc Af Amer: >60 mL/min (ref >60)
GFR calc non Af Amer: 55 mL/min — ABNORMAL LOW (ref >60)
Glucose, Bld: 83 mg/dL (ref 70–99)
Potassium: 4.1 mmol/L (ref 3.5–5.1)
Sodium: 138 mmol/L (ref 135–145)

## 2020-01-12 NOTE — Progress Notes (Addendum)
Cardiology Office Note  Date:  01/12/2020   ID:  Steve Salazar, DOB 1950/08/25, MRN [id number]  PCP:  [name], MD   Chief Complaint  Patient presents with  . OTHER    6 month f/u c/o hernia pain. Meds reviewed verbally with pt.    HPI:  Steve Salazar is a pleasant 69 year old gentleman with admission  07/12/10 with shortness of breath,  systolic congestive heart failure with ejection fraction 25%,  nonischemic cardiomyopathy  negative stress test  who presents for followup of his cardiomyopathy.    had covid infection 03/2019 Continues to have pain in his groin from hernia  compliant with hisLasix, carvedilol, entresto, spironolactone Works full-time Works in [employer], significant perspiration  Has had increased dizziness  Recent echocardiogram ejection fraction 20%, unchanged from prior study Reports he has not been getting up much to urinate, overall feels well with no shortness of breath  EKG personally reviewed by myself on todays visit Shows normal sinus rhythm with rate 80 bpm, intraventricular conduction delay, APCs, left anterior fascicular block Other past medical history April 2013 was in the emergency room last week for shortness of breath, abdominal bloating He was given Lasix IV with improvement of his symptoms Lab work reviewed with him, Potassium in the hospital for 4.0, BNP 1200,  chest x-ray mild interstitial edema  Previous Stress test showed severe cardiomyopathy with ejection fraction 25%, dilated left ventricle, low risk scan. Initial BNP on arrival to the hospital was 1342 He denied any history of drug or alcohol use, no cocaine. Previous lab work showing Total cholesterol 167, LDL 74, HDL 77.   Echo showing moderately dilated left ventricle, ejection fraction 25 to 30%, diastolic relaxation abnormality, normal RV size and function, mild to moderate mitral regurgitation, mild to moderately dilated left atrium  PMH:   has a past  medical history of Arrhythmia, CHF (congestive heart failure) (HCC), Hypertension, Hypertensive cardiomyopathy (HCC), and Renal insufficiency.  PSH:   History reviewed. No pertinent surgical history.  Current Outpatient Medications  Medication Sig Dispense Refill  . carvedilol (COREG) 25 MG tablet Take 1 tablet (25 mg total) by mouth 2 (two) times daily with a meal. 180 tablet 3  . furosemide (LASIX) 40 MG tablet Take 1 tablet (40 mg total) once daily on Monday and Thursday each week. (Patient taking differently: Take 1 tablet (40 mg total) once daily.) 90 tablet 0  . naproxen sodium (ANAPROX) 220 MG tablet Take 220 mg by mouth as needed.    . sacubitril-valsartan (ENTRESTO) 49-51 MG Take 1 tablet by mouth 2 (two) times daily. 180 tablet 0  . sildenafil (REVATIO) 20 MG tablet TAKE 1 TABLET BY MOUTH THREE TIMES DAILY IF NEEDED 270 tablet 0  . tamsulosin (FLOMAX) 0.4 MG CAPS capsule Take 0.4 mg by mouth daily.      No current facility-administered medications for this visit.     Allergies:   Patient has no known allergies.   Social History:  The patient  reports that he has quit smoking. His smoking use included cigarettes. He has never used smokeless tobacco. He reports current alcohol use of about 2.0 standard drinks of alcohol per week. He reports that he does not use drugs.   Family History:   family history includes Heart attack in his father.    Review of Systems: Review of Systems  Constitutional: Negative.   HENT: Negative.   Respiratory: Negative.   Cardiovascular: Negative.   Gastrointestinal: Negative.  Right groin pain  Musculoskeletal: Negative.   Neurological: Positive for dizziness.  Psychiatric/Behavioral: Negative.   All other systems reviewed and are negative.   PHYSICAL EXAM: VS:  BP (!) 70/50 (BP Location: Left Arm, Patient Position: Sitting, Cuff Size: Normal)   Pulse 80   Ht 5\' 11"  (1.803 m)   Wt 201 lb 6 oz (91.3 kg)   SpO2 98%   BMI 28.09 kg/m   , BMI Body mass index is 28.09 kg/m. Constitutional:  oriented to person, place, and time. No distress.  HENT:  Head: Grossly normal Eyes:  no discharge. No scleral icterus.  Neck: No JVD, no carotid bruits  Cardiovascular: Regular rate and rhythm, no murmurs appreciated Pulmonary/Chest: Clear to auscultation bilaterally, no wheezes or rails Abdominal: Soft.  no distension.  no tenderness.  Musculoskeletal: Normal range of motion Neurological:  normal muscle tone. Coordination normal. No atrophy Skin: Skin warm and dry Psychiatric: normal affect, pleasant   Recent Labs: 06/01/2019: Magnesium 2.0; TSH 3.100 06/15/2019: BUN 16; Creatinine, Ser 1.21; Potassium 4.0; Sodium 143    Lipid Panel Lab Results  Component Value Date   CHOL 208 (H) 01/26/2013   HDL 63 01/26/2013   LDLCALC 129 (H) 01/26/2013   TRIG 80 01/26/2013      Wt Readings from Last 3 Encounters:  01/12/20 201 lb 6 oz (91.3 kg)  12/10/19 204 lb 8 oz (92.8 kg)  07/14/19 206 lb 2 oz (93.5 kg)       ASSESSMENT AND PLAN:  Acute on chronic systolic CHF (congestive heart failure) (HCC) -  Hypotensive on today's visit pressure 70 systolic, having dizzy spells Blood pressure confirmed by myself Reports he has significant perspiration at work, works in the hot environment Weight is down 5 pounds from earlier in the year Recommend he hold spironolactone, we will recheck BMP BMP done through primary care shows bump in creatinine up to 1.4, suspect prerenal at that time Suggested he decrease his Entresto in half twice daily Hold Lasix tomorrow while we wait for lab work  Dilated cardiomyopathy Prior stress testing showing no ischemia For hypotension, changes as above  Essential hypertension -  Hypotensive today Management as above, likely dehydrated or overmedicated or both  SVT/ PSVT/ PAT - Plan: EKG 12-Lead Denies any tachycardia    Total encounter time more than 25 minutes  Greater than 50% was spent in  counseling and coordination of care with the patient     Orders Placed This Encounter  Procedures  . Basic metabolic panel  . EKG 12-Lead     Signed, 07/16/19, M.D., Ph.D. 01/12/2020  [facility] [place], [place]

## 2020-01-12 NOTE — Patient Instructions (Signed)
Medication Instructions:  Hold the spironolactone  Please break the entresto in 1/2 twice a day for now until blood pressure comes back up   If you need a refill on your cardiac medications before your next appointment, please call your pharmacy.    Lab work: [employer] today   If you have labs (blood work) drawn today and your tests are completely normal, you will receive your results only by:  MyChart Message (if you have MyChart) OR  A paper copy in the mail If you have any lab test that is abnormal or we need to change your treatment, we will call you to review the results.   Testing/Procedures: No new testing needed   Follow-Up: At [facility], you and your health needs are our priority.  As part of our continuing mission to provide you with exceptional heart care, we have created designated Provider Care Teams.  These Care Teams include your primary Cardiologist (physician) and Advanced Practice Providers (APPs -  Physician Assistants and Nurse Practitioners) who all work together to provide you with the care you need, when you need it.   You will need a follow up appointment in 1 month   Providers on your designated Care Team:    Nicolasa Ducking, NP  Eula Listen, PA-C  Marisue Ivan, PA-C  Any Other Special Instructions Will Be Listed Below (If Applicable).  COVID-19 Vaccine Information can be found at: [email or web address] For questions related to vaccine distribution or appointments, please email [email or web address] or call [phone].

## 2020-02-14 NOTE — Progress Notes (Signed)
Cardiology Office Note    Date:  02/15/2020   ID:  Steve Salazar, DOB 1950/06/19, MRN [id number]  PCP:  [name], MD  Cardiologist:  [name], MD  Electrophysiologist:  None   Chief Complaint: Follow up  History of Present Illness:   Steve Salazar is a 68 y.o. male with history of HFrEF secondary to presumedNICM, paroxysmal SVT,COVID-19 infection in 03/2019,renal dysfunction, and hypertensive heart disease who presents for follow-up of his cardiomyopathy.  He was admitted to the hospital in 2011with dyspnea. Notes indicate echo showed an EF of 25-30%, diastolic dysfunction, mild to moderate mitral regurgitation, moderately dilated left atrium, and normal RV systolic function and size. Nuclear stress test in 2011 showed decreased perfusion in the basal to distal inferior wall as well as inferior apical region concerning for attenuation artifact as well as GI uptake artifact in the mid to distal inferior wall with an EF of 27%. Overall, this was read as a low risk scan. Holter monitor from 06/2009 showed 2 runs of SVT with the longest lasting 8 minutes and unable to exclude atrial flutter.  Echo from 10/2016,to evaluate his cardiomyopathy,showed a persistently low LV systolic function with an EF of 20 to 25%, diffuse hypokinesis with akinesis of the inferior myocardium, grade 1 diastolic dysfunction, mild mitral vegetation, normal RV systolic function, normal PASP. Report indicated hedevelopedawide-complex tachycardia with a rate of 116 bpm during the study. In this setting, he underwent outpatient cardiac monitoring which showed NSR with 3 episodes of SVT lasting less than 1 minute in duration with a rate up to 180bpm and no other significant arrhythmia.  He was seen in the [facility] ED in 03/2019 with dyspnea and found to have pneumonia secondary to COVID-19. He did not require hospital admission.    Despite optimization of GDMT, most recent echo from  11/2019 demonstrated a persistent cardiomyopathy with an EF of 20-25%, global hypokinesis, mild LVH, Gr2DD, mildly reduced RVSF with mildly enlarged RV cavity size, severely dilated left atrium, moderately dilated right atrium, mild to moderate AI, moderate PR, and a mildly dilated ascending aorta.  He was most recently seen by Dr. Mariah Milling in 12/2019 with a stable weight of 201 pounds and a BP of 70/50 with noted increase in dizziness. He reported significant perspiration at work. Spironolactone was held and Sherryll Burger was decreased by half.  He comes in doing well from a cardiac perspective.  Since he was last seen he notes an improvement in his dizziness.  He did not stop spironolactone as outlined above, though did decrease his Entresto to half tab twice daily and did temporarily hold Lasix.  Current medical regimen includes carvedilol 25 mg twice daily, Entresto 49/50 one half tab twice daily, Lasix 40 mg daily, and spironolactone 25 mg daily.  He has increased his water intake with noted stable weight at home.  No lower extremity swelling, abdominal distention, orthopnea, PND, or early satiety.  No presyncope or syncope.  He is watching his salt intake.  He remains very active with his job which leads to significant amount of sweating.   Labs independently reviewed: 12/2019 - potassium 4.1, BUN 18, SCr 1.32 10/2019 - HGB 13.6, PLT 178, albumin 3.6, AST/ALT normal 06/2019 - TSH normal, magnesium 2.0 06/2018 - TC 183, TG 55, HDL 54, LDL 118  Past Medical History:  Diagnosis Date  . Arrhythmia    SVT verus A-fib  . CHF (congestive heart failure) (HCC)   . Hypertension   . Hypertensive cardiomyopathy (  HCC)    EF 35%  . Renal insufficiency    Mild chronic    History reviewed. No pertinent surgical history.  Current Medications: Current Meds  Medication Sig  . carvedilol (COREG) 25 MG tablet Take 1 tablet (25 mg total) by mouth 2 (two) times daily with a meal.  . furosemide (LASIX) 40 MG tablet  Take 40 mg by mouth daily.  . naproxen sodium (ANAPROX) 220 MG tablet Take 220 mg by mouth as needed.  . sacubitril-valsartan (ENTRESTO) 49-51 MG Take 1 tablet by mouth 2 (two) times daily.  . sildenafil (REVATIO) 20 MG tablet TAKE 1 TABLET BY MOUTH THREE TIMES DAILY IF NEEDED  . spironolactone (ALDACTONE) 25 MG tablet Take 12.5 mg by mouth daily.  . tamsulosin (FLOMAX) 0.4 MG CAPS capsule Take 0.4 mg by mouth daily.     Allergies:   Patient has no known allergies.   Social History   Socioeconomic History  . Marital status: Single    Spouse name: Not on file  . Number of children: Not on file  . Years of education: Not on file  . Highest education level: Not on file  Occupational History  . Occupation: Full time  Tobacco Use  . Smoking status: Former Smoker    Types: Cigarettes  . Smokeless tobacco: Never Used  . Tobacco comment: Tobaco use-no  Vaping Use  . Vaping Use: Never used  Substance and Sexual Activity  . Alcohol use: Yes    Alcohol/week: 2.0 standard drinks    Types: 2 Cans of beer per week    Comment: ocassional  . Drug use: No  . Sexual activity: Not on file  Other Topics Concern  . Not on file  Social History Narrative   Pt gets regular exercise.   Social Determinants of Health   Financial Resource Strain:   . Difficulty of Paying Living Expenses: Not on file  Food Insecurity:   . Worried About Programme researcher, broadcasting/film/video in the Last Year: Not on file  . Ran Out of Food in the Last Year: Not on file  Transportation Needs:   . Lack of Transportation (Medical): Not on file  . Lack of Transportation (Non-Medical): Not on file  Physical Activity:   . Days of Exercise per Week: Not on file  . Minutes of Exercise per Session: Not on file  Stress:   . Feeling of Stress : Not on file  Social Connections:   . Frequency of Communication with Friends and Family: Not on file  . Frequency of Social Gatherings with Friends and Family: Not on file  . Attends Religious  Services: Not on file  . Active Member of Clubs or Organizations: Not on file  . Attends Banker Meetings: Not on file  . Marital Status: Not on file     Family History:  The patient's family history includes Heart attack in his father.  ROS:   Review of Systems  Constitutional: Negative for chills, diaphoresis, fever, malaise/fatigue and weight loss.  HENT: Negative for congestion.   Eyes: Negative for discharge and redness.  Respiratory: Negative for cough, sputum production, shortness of breath and wheezing.   Cardiovascular: Negative for chest pain, palpitations, orthopnea, claudication, leg swelling and PND.  Gastrointestinal: Negative for abdominal pain, heartburn, nausea and vomiting.  Musculoskeletal: Negative for falls and myalgias.  Skin: Negative for rash.  Neurological: Negative for dizziness, tingling, tremors, sensory change, speech change, focal weakness, loss of consciousness and weakness.  Endo/Heme/Allergies: Does  not bruise/bleed easily.  Psychiatric/Behavioral: Negative for substance abuse. The patient is not nervous/anxious.   All other systems reviewed and are negative.    EKGs/Labs/Other Studies Reviewed:    Studies reviewed were summarized above. The additional studies were reviewed today:  2D echo 12/22/2019: 1. Left ventricular ejection fraction, by estimation, is 20 to 25%. The  left ventricle has severely decreased function. The left ventricle  demonstrates global hypokinesis. The left ventricular internal cavity size  was severely dilated. There is mild  left ventricular hypertrophy. Left ventricular diastolic parameters are  consistent with Grade II diastolic dysfunction (pseudonormalization).  Elevated left atrial pressure.  2. Right ventricular systolic function is mildly reduced. The right  ventricular size is mildly enlarged. Tricuspid regurgitation signal is  inadequate for assessing PA pressure.  3. Left atrial size was  severely dilated.  4. Right atrial size was moderately dilated.  5. The mitral valve is normal in structure. Moderate mitral valve  regurgitation. No evidence of mitral stenosis.  6. The aortic valve is tricuspid. Aortic valve regurgitation is mild to  moderate. No aortic stenosis is present.  7. Pulmonic valve regurgitation is moderate.  8. Aortic dilatation noted. There is mild dilatation of the ascending  aorta measuring 42 mm.  9. The inferior vena cava is normal in size with greater than 50%  respiratory variability, suggesting right atrial pressure of 3 mmHg. __________  Eugenie Birks MPI 12/18/2019: Pharmacological myocardial perfusion imaging study with no significant  Ischemia Fixed inferolateral defect consistent with prior MI Hypokinesis of the inferolateral wall, EF estimated at 17% No EKG changes concerning for ischemia at peak stress or in recovery. High risk scan secondary to cardiomyopathy __________  Event monitor 12/2016: Normal sinus rhythm 3 episodes of SVT lasting <1 min Rate up to 180 bpm  No other significant arrhythmia __________  2D echo 10/2016: - Left ventricle: The cavity size was moderately dilated. Systolic  function was severely reduced. The estimated ejection fraction  was in the range of 20% to 25%. Diffuse hypokinesis. Akinesis of  the inferior myocardium. Doppler parameters are consistent with  abnormal left ventricular relaxation (grade 1 diastolic  dysfunction).  - Mitral valve: There was mild regurgitation.  - Left atrium: The atrium was normal in size.  - Right ventricle: Systolic function was normal.  - Pulmonary arteries: Systolic pressure was within the normal  range.   Impressions:   - Patient developed wide complex tachycardia, rate 116 bpm during  the study.    EKG:  EKG is ordered today.  The EKG ordered today demonstrates NSR, 74 bpm, LBBB (known), occasional PVCs  Recent Labs: 06/01/2019: Magnesium 2.0; TSH  3.100 01/12/2020: BUN 18; Creatinine, Ser 1.32; Potassium 4.1; Sodium 138  Recent Lipid Panel    Component Value Date/Time   CHOL 208 (H) 01/26/2013 0826   TRIG 80 01/26/2013 0826   HDL 63 01/26/2013 0826   CHOLHDL 3.3 01/26/2013 0826   LDLCALC 129 (H) 01/26/2013 0826    PHYSICAL EXAM:    VS:  BP (!) 130/94 (BP Location: Left Arm, Patient Position: Sitting, Cuff Size: Normal)   Pulse 74   Ht 5\' 11"  (1.803 m)   Wt 204 lb (92.5 kg)   SpO2 98%   BMI 28.45 kg/m   BMI: Body mass index is 28.45 kg/m.  Physical Exam Constitutional:      Appearance: He is well-developed.  HENT:     Head: Normocephalic and atraumatic.  Eyes:     General:  Right eye: No discharge.        Left eye: No discharge.  Neck:     Vascular: No JVD.  Cardiovascular:     Rate and Rhythm: Normal rate and regular rhythm.     Pulses: No midsystolic click and no opening snap.          Posterior tibial pulses are 2+ on the right side and 2+ on the left side.     Heart sounds: Normal heart sounds, S1 normal and S2 normal. Heart sounds not distant. No murmur heard.  No friction rub.  Pulmonary:     Effort: Pulmonary effort is normal. No respiratory distress.     Breath sounds: Normal breath sounds. No decreased breath sounds, wheezing or rales.  Chest:     Chest wall: No tenderness.  Abdominal:     General: There is no distension.     Palpations: Abdomen is soft.     Tenderness: There is no abdominal tenderness.  Musculoskeletal:     Cervical back: Normal range of motion.  Skin:    General: Skin is warm and dry.     Nails: There is no clubbing.  Neurological:     Mental Status: He is alert and oriented to person, place, and time.  Psychiatric:        Speech: Speech normal.        Behavior: Behavior normal.        Thought Content: Thought content normal.        Judgment: Judgment normal.     Wt Readings from Last 3 Encounters:  02/15/20 204 lb (92.5 kg)  01/12/20 201 lb 6 oz (91.3 kg)   12/10/19 204 lb 8 oz (92.8 kg)     ASSESSMENT & PLAN:   1. HFrEF secondary to presumed NICM with underlying LBBB: He has NYHA class I-II symptoms.  He appears euvolemic and well compensated.  Despite maximally tolerated GDMT including carvedilol, Entresto, spironolactone, and Lasix his cardiomyopathy persists with an EF of 25% by echo in 11/2019.  Prior nuclear stress testing showed no significant ischemia.  Refer to EP for consideration of CRT-D given persistent cardiomyopathy with underlying LBBB.  Of note, he may need to undergo formal ischemic evaluation with diagnostic cardiac cath, though will defer this recommendation to EP and his primary cardiologist.  If renal function allows on follow-up BMP we will look to increase his Entresto back to 49/51 mg twice daily along with the continuation of carvedilol, spironolactone, and Lasix.  CHF education.  2. Paroxysmal SVT: Symptoms well controlled.  Continue carvedilol 25 mg twice daily.  3. Hypertensive heart disease: Blood pressure is mildly elevated at 130/94 in the office today however he was noted to be significantly hypotensive at his last office visit with a BP of 70/50 leading to the recommendation to decrease Entresto by half and hold Lasix and spironolactone.  Check BMP today.  If stable renal function and potassium would like to reescalate Entresto back to 49/51 mg twice daily.  Of note, patient did not hold spironolactone and has continued this along with Lasix on a daily basis since he was last seen.  4. Renal dysfunction: Check BMP.  Disposition: F/u with Dr. Mariah Milling or an APP in 1 to 2 months.   Medication Adjustments/Labs and Tests Ordered: Current medicines are reviewed at length with the patient today.  Concerns regarding medicines are outlined above. Medication changes, Labs and Tests ordered today are summarized above and listed in the Patient Instructions accessible  in Encounters.   Signed, Eula Listen, PA-C 02/15/2020 3:39 PM      [facility] [place] [address] [place], [place] [address] [phone]

## 2020-02-15 ENCOUNTER — Ambulatory Visit (INDEPENDENT_AMBULATORY_CARE_PROVIDER_SITE_OTHER): Payer: Medicare HMO | Admitting: Physician Assistant

## 2020-02-15 ENCOUNTER — Other Ambulatory Visit: Payer: Self-pay

## 2020-02-15 ENCOUNTER — Encounter: Payer: Self-pay | Admitting: Physician Assistant

## 2020-02-15 VITALS — BP 130/94 | HR 74 | Ht 71.0 in | Wt 204.0 lb

## 2020-02-15 DIAGNOSIS — N289 Disorder of kidney and ureter, unspecified: Secondary | ICD-10-CM

## 2020-02-15 DIAGNOSIS — I428 Other cardiomyopathies: Secondary | ICD-10-CM

## 2020-02-15 DIAGNOSIS — I5022 Chronic systolic (congestive) heart failure: Secondary | ICD-10-CM | POA: Diagnosis not present

## 2020-02-15 DIAGNOSIS — I10 Essential (primary) hypertension: Secondary | ICD-10-CM | POA: Diagnosis not present

## 2020-02-15 DIAGNOSIS — I471 Supraventricular tachycardia: Secondary | ICD-10-CM | POA: Diagnosis not present

## 2020-02-15 NOTE — Patient Instructions (Addendum)
Medication Instructions: Your physician recommends that you continue on your current medications as directed. Please refer to the Current Medication list given to you today.  *If you need a refill on your cardiac medications before your next appointment, please call your pharmacy*   Lab Work: BMET today   If you have labs (blood work) drawn today and your tests are completely normal, you will receive your results only by: Marland Kitchen MyChart Message (if you have MyChart) OR . A paper copy in the mail If you have any lab test that is abnormal or we need to change your treatment, we will call you to review the results.   Testing/Procedures: NONE     Follow-Up: At [facility], you and your health needs are our priority.  As part of our continuing mission to provide you with exceptional heart care, we have created designated Provider Care Teams.  These Care Teams include your primary Cardiologist (physician) and Advanced Practice Providers (APPs -  Physician Assistants and Nurse Practitioners) who all work together to provide you with the care you need, when you need it.  We recommend signing up for the patient portal called "MyChart".  Sign up information is provided on this After Visit Summary.  MyChart is used to connect with patients for Virtual Visits (Telemedicine).  Patients are able to view lab/test results, encounter notes, upcoming appointments, etc.  Non-urgent messages can be sent to your provider as well.   To learn more about what you can do with MyChart, go to [email or web address].    Your next appointment:  You have been referred to Dr. Steffanie Dunn.  You will need to follow up with Eula Listen, NP or Dr. Mariah Milling after you have seen Dr. Lalla Brothers.   The format for your next appointment:   In Person  Provider:   You may see [name], MD or one of the following Advanced Practice Providers on your designated Care Team:    Nicolasa Ducking, NP  Eula Listen, PA-C  Marisue Ivan, PA-C  Cadence Fransico Michael, [place]    Other Instructions

## 2020-02-16 ENCOUNTER — Telehealth: Payer: Self-pay | Admitting: *Deleted

## 2020-02-16 LAB — BASIC METABOLIC PANEL
BUN/Creatinine Ratio: 14 (ref 10–24)
BUN: 17 mg/dL (ref 8–27)
CO2: 22 mmol/L (ref 20–29)
Calcium: 9.3 mg/dL (ref 8.6–10.2)
Chloride: 106 mmol/L (ref 96–106)
Creatinine, Ser: 1.25 mg/dL (ref 0.76–1.27)
GFR calc Af Amer: 67 mL/min/{1.73_m2} (ref >59)
GFR calc non Af Amer: 58 mL/min/{1.73_m2} — ABNORMAL LOW (ref >59)
Glucose: 98 mg/dL (ref 65–99)
Potassium: 4.2 mmol/L (ref 3.5–5.2)
Sodium: 141 mmol/L (ref 134–144)

## 2020-02-16 NOTE — Telephone Encounter (Signed)
No answer. No voicemail. 

## 2020-02-16 NOTE — Telephone Encounter (Signed)
-----   Message from Sondra Barges, PA-C sent at 02/16/2020  1:55 PM EDT ----- Renal function is improved and at his approximate baseline Potassium at goal  Recommendations: -Resume Entresto 49/51 mg 1 tab bid

## 2020-02-18 NOTE — Telephone Encounter (Signed)
2nd attempt to call the patient. No answer- no voice mail set up.

## 2020-02-22 MED ORDER — ENTRESTO 49-51 MG PO TABS: 1.0000 | ORAL_TABLET | Freq: Two times a day (BID) | ORAL | 6 refills | Status: DC

## 2020-02-22 NOTE — Telephone Encounter (Signed)
I spoke with the patient regarding his lab results and Alycia Rossetti, PAs recommendation to:  1) resume entresto 49/51 mg- 1 tablet by mouth twice daily  The patient voices understanding and is agreeable. He advised he did need a refill for this sent in to the pharmacy

## 2020-03-02 ENCOUNTER — Other Ambulatory Visit: Payer: Self-pay

## 2020-03-02 ENCOUNTER — Ambulatory Visit: Payer: Medicare HMO | Admitting: Cardiology

## 2020-03-02 ENCOUNTER — Encounter: Payer: Self-pay | Admitting: Cardiology

## 2020-03-02 VITALS — BP 106/72 | HR 65 | Ht 71.0 in | Wt 205.2 lb

## 2020-03-02 DIAGNOSIS — I420 Dilated cardiomyopathy: Secondary | ICD-10-CM

## 2020-03-02 DIAGNOSIS — I509 Heart failure, unspecified: Secondary | ICD-10-CM | POA: Insufficient documentation

## 2020-03-02 DIAGNOSIS — I447 Left bundle-branch block, unspecified: Secondary | ICD-10-CM | POA: Diagnosis not present

## 2020-03-02 DIAGNOSIS — I5022 Chronic systolic (congestive) heart failure: Secondary | ICD-10-CM

## 2020-03-02 NOTE — Progress Notes (Signed)
Electrophysiology Office Note:    Date:  03/02/2020   ID:  Steve Salazar, DOB [birth year], MRN [id number]  PCP:  Steve Sites, MD  [facility] Cardiologist:  Steve Nordmann, MD  [facility] Electrophysiologist:  Steve Prude, MD   Referring MD: Steve Barges, PA-C   Chief Complaint: Nonischemic cardiomyopathy  History of Present Illness:    Steve Salazar is a 69 y.o. male who presents for an evaluation of nonischemic cardiomyopathy at the request of Steve Listen, PA-C. Their medical history includes chronic systolic and diastolic heart failure secondary to presumed nonischemic cardiomyopathy, paroxysmal SVT, COVID-19 infection in December 2020, hypertensive heart disease.  He last all Steve Salazar on February 15, 2020.  His history of cardiomyopathy dates back to 2011 when he was admitted with dyspnea.  Echocardiogram at that time showed an ejection fraction of 25 to 30 percent.  A nuclear stress test performed in 2011 showed decreased perfusion in the basal to distal inferior wall which was thought to be secondary to an attenuation artifact.  Repeat echo was done in 2018 which showed a persistently decreased left ventricular function of 20 percent.  There was a regional wall motion abnormality in the inferior wall.  He was then seen in December 2020 at [facility] and was diagnosed with COVID-19.  He was discharged.  Follow-up echocardiogram in August 2021 again showed a decreased ejection fraction of 20 to 25 percent.  He has been maintained on good medical therapy including Entresto, carvedilol, Lasix, spironolactone.  His weights are stable.  No syncope or presyncope.  He tells me that he notices dyspnea with exertion that is significantly worse than several years ago.  Past Medical History:  Diagnosis Date  . Arrhythmia    SVT verus A-fib  . CHF (congestive heart failure) (HCC)   . Hypertension   . Hypertensive cardiomyopathy (HCC)    EF 35%  . Renal insufficiency     Mild chronic    History reviewed. No pertinent surgical history.  Current Medications: Current Meds  Medication Sig  . carvedilol (COREG) 25 MG tablet Take 1 tablet (25 mg total) by mouth 2 (two) times daily with a meal.  . furosemide (LASIX) 40 MG tablet Take 40 mg by mouth daily.  . naproxen sodium (ANAPROX) 220 MG tablet Take 220 mg by mouth as needed.  . sacubitril-valsartan (ENTRESTO) 49-51 MG Take 1 tablet by mouth 2 (two) times daily.  . sildenafil (REVATIO) 20 MG tablet TAKE 1 TABLET BY MOUTH THREE TIMES DAILY IF NEEDED  . spironolactone (ALDACTONE) 25 MG tablet Take 12.5 mg by mouth daily.  . tamsulosin (FLOMAX) 0.4 MG CAPS capsule Take 0.4 mg by mouth daily.      Allergies:   Patient has no known allergies.   Social History   Socioeconomic History  . Marital status: Single    Spouse name: Not on file  . Number of children: Not on file  . Years of education: Not on file  . Highest education level: Not on file  Occupational History  . Occupation: Full time  Tobacco Use  . Smoking status: Former Smoker    Types: Cigarettes  . Smokeless tobacco: Never Used  . Tobacco comment: Tobaco use-no  Vaping Use  . Vaping Use: Never used  Substance and Sexual Activity  . Alcohol use: Yes    Alcohol/week: 2.0 standard drinks    Types: 2 Cans of beer per week    Comment: ocassional  . Drug  use: No  . Sexual activity: Not on file  Other Topics Concern  . Not on file  Social History Narrative   Pt gets regular exercise.   Social Determinants of Health   Financial Resource Strain:   . Difficulty of Paying Living Expenses: Not on file  Food Insecurity:   . Worried About Programme researcher, broadcasting/film/video in the Last Year: Not on file  . Ran Out of Food in the Last Year: Not on file  Transportation Needs:   . Lack of Transportation (Medical): Not on file  . Lack of Transportation (Non-Medical): Not on file  Physical Activity:   . Days of Exercise per Week: Not on file  . Minutes  of Exercise per Session: Not on file  Stress:   . Feeling of Stress : Not on file  Social Connections:   . Frequency of Communication with Friends and Family: Not on file  . Frequency of Social Gatherings with Friends and Family: Not on file  . Attends Religious Services: Not on file  . Active Member of Clubs or Organizations: Not on file  . Attends Banker Meetings: Not on file  . Marital Status: Not on file     Family History: The patient's family history includes Heart attack in his father.  ROS:   Please see the history of present illness.    All other systems reviewed and are negative.  EKGs/Labs/Other Studies Reviewed:    The following studies were reviewed today: Prior records, echo   EKG:  The ekg ordered today demonstrates sinus rhythm with a left bundle branch block pattern in lead V1.   December 22, 2019 echo personally reviewed Severe left ventricular dysfunction, 20 to 25 percent.  Global hypokinesis.  Dilated left ventricle. Right ventricular function mildly reduced Biatrial enlargement Moderate mitral regurgitation Mild to moderate aortic regurgitation Moderate pulmonic valve regurgitation  Recent Labs: 06/01/2019: Magnesium 2.0; TSH 3.100 02/15/2020: BUN 17; Creatinine, Ser 1.25; Potassium 4.2; Sodium 141  Recent Lipid Panel    Component Value Date/Time   CHOL 208 (H) 01/26/2013 0826   TRIG 80 01/26/2013 0826   HDL 63 01/26/2013 0826   CHOLHDL 3.3 01/26/2013 0826   LDLCALC 129 (H) 01/26/2013 0826    Physical Exam:    VS:  BP 106/72   Pulse 65   Ht 5\' 11"  (1.803 m)   Wt 205 lb 3.2 oz (93.1 kg)   SpO2 97%   BMI 28.62 kg/m     Wt Readings from Last 3 Encounters:  03/02/20 205 lb 3.2 oz (93.1 kg)  02/15/20 204 lb (92.5 kg)  01/12/20 201 lb 6 oz (91.3 kg)     GEN: Well nourished, well developed in no acute distress HEENT: Normal NECK: No JVD; No carotid bruits LYMPHATICS: No lymphadenopathy CARDIAC: RRR, no murmurs, rubs,  gallops RESPIRATORY:  Clear to auscultation without rales, wheezing or rhonchi  ABDOMEN: Soft, non-tender, non-distended MUSCULOSKELETAL:  No edema; No deformity  SKIN: Warm and dry NEUROLOGIC:  Alert and oriented x 3 PSYCHIATRIC:  Normal affect   ASSESSMENT:    1. Dilated cardiomyopathy (HCC)   2. LBBB (left bundle branch block)    PLAN:    In order of problems listed above:  1. Chronic combined systolic and diastolic heart failure From the echo, appears to be secondary to a nonischemic cardiomyopathy.  He has not had a formal coronary evaluation.  I would like to have this done prior to the implantation of the CRT-D.  He  currently displays NYHA class II-III symptoms.  We discussed defibrillator therapy at length with the patient and his wife who is with him today.  I also discussed cardiac resynchronization therapy with the patient and his wife.  He clearly has an indication for an ICD and given his left ventricular dysfunction and left bundle branch block pattern in lead V1, I think there is a reasonable possibility that a CRT device could improve his symptoms.  I discussed the procedure at length with the patient and his wife including the risks and benefits.  I specifically discussed the risks that we would not be able to cannulate a suitable branch off of the coronary sinus.  If this were to occur would still implant a defibrillator.  The patient has chronic systolic heart failure, NYHA Class II-III CHF.  He is referred by Dr Mariah Milling for risk stratification of sudden death and consideration of CRT-D implantation.  At this time, he meets criteria for ICD implantation for primary prevention of sudden death.  I have had a thorough discussion with the patient reviewing options.  The patient and their family (if available) have had opportunities to ask questions and have them answered. The patient and I have decided together through a shared decision making process to proceed with CRT D implant.    Risks, benefits, alternatives to CRT-D implantation were discussed in detail with the patient today. The patient understands that the risks include but are not limited to bleeding, infection, pneumothorax, perforation, tamponade, vascular damage, renal failure, MI, stroke, death, inappropriate shocks, and lead dislodgement and wishes to proceed.  We will therefore schedule device implantation at the next available time.     Medication Adjustments/Labs and Tests Ordered: Current medicines are reviewed at length with the patient today.  Concerns regarding medicines are outlined above.  Orders Placed This Encounter  Procedures  . EKG 12-Lead   No orders of the defined types were placed in this encounter.    Signed, Steffanie Dunn, MD, [facility]  03/02/2020 5:55 PM    Electrophysiology [facility]

## 2020-03-02 NOTE — Patient Instructions (Addendum)
Medication Instructions:  Your physician recommends that you continue on your current medications as directed. Please refer to the Current Medication list given to you today. *If you need a refill on your cardiac medications before your next appointment, please call your pharmacy*  Lab Work: None ordered. If you have labs (blood work) drawn today and your tests are completely normal, you will receive your results only by: Marland Kitchen MyChart Message (if you have MyChart) OR . A paper copy in the mail If you have any lab test that is abnormal or we need to change your treatment, we will call you to review the results.  Testing/Procedures: Your physician has recommended that you have a defibrillator inserted. An implantable cardioverter defibrillator (ICD) is a small device that is placed in your chest or, in rare cases, your abdomen. This device uses electrical pulses or shocks to help control life-threatening, irregular heartbeats that could lead the heart to suddenly stop beating (sudden cardiac arrest). Leads are attached to the ICD that goes into your heart. This is done in the hospital and usually requires an overnight stay. Please see the instruction sheet given to you today for more information.  Follow-Up:  SEE INSTRUCTION LETTER   Cardioverter Defibrillator Implantation  An implantable cardioverter defibrillator (ICD) is a small device that is placed under the skin in the chest or abdomen. An ICD consists of a battery, a small computer (pulse generator), and wires (leads) that go into the heart. An ICD is used to detect and correct two types of dangerous irregular heartbeats (arrhythmias):  A rapid heart rhythm (tachycardia).  An arrhythmia in which the lower chambers of the heart (ventricles) contract in an uncoordinated way (fibrillation). When an ICD detects tachycardia, it sends a low-energy shock to the heart to restore the heartbeat to normal (cardioversion). This signal is usually  painless. If cardioversion does not work or if the ICD detects fibrillation, it delivers a high-energy shock to the heart (defibrillation) to restart the heart. This shock may feel like a strong jolt in the chest. Your health care provider may prescribe an ICD if:  You have had an arrhythmia that originated in the ventricles.  Your heart has been damaged by a disease or heart condition. Sometimes, ICDs are programmed to act as a device called a pacemaker. Pacemakers can be used to treat a slow heartbeat (bradycardia) or tachycardia by taking over the heart rate with electrical impulses. Tell a health care provider about:  Any allergies you have.  All medicines you are taking, including vitamins, herbs, eye drops, creams, and over-the-counter medicines.  Any problems you or family members have had with anesthetic medicines.  Any blood disorders you have.  Any surgeries you have had.  Any medical conditions you have.  Whether you are pregnant or may be pregnant. What are the risks? Generally, this is a safe procedure. However, problems may occur, including:  Swelling, bleeding, or bruising.  Infection.  Blood clots.  Damage to other structures or organs, such as nerves, blood vessels, or the heart.  Allergic reactions to medicines used during the procedure. What happens before the procedure? Staying hydrated Follow instructions from your health care provider about hydration, which may include:  Up to 2 hours before the procedure - you may continue to drink clear liquids, such as water, clear fruit juice, black coffee, and plain tea. Eating and drinking restrictions Follow instructions from your health care provider about eating and drinking, which may include:  8 hours before the  procedure - stop eating heavy meals or foods such as meat, fried foods, or fatty foods.  6 hours before the procedure - stop eating light meals or foods, such as toast or cereal.  6 hours before  the procedure - stop drinking milk or drinks that contain milk.  2 hours before the procedure - stop drinking clear liquids. Medicine Ask your health care provider about:  Changing or stopping your normal medicines. This is important if you take diabetes medicines or blood thinners.  Taking medicines such as aspirin and ibuprofen. These medicines can thin your blood. Do not take these medicines before your procedure if your doctor tells you not to. Tests  You may have blood tests.  You may have a test to check the electrical signals in your heart (electrocardiogram, ECG).  You may have imaging tests, such as a chest X-ray. General instructions  For 24 hours before the procedure, stop using products that contain nicotine or tobacco, such as cigarettes and e-cigarettes. If you need help quitting, ask your health care provider.  Plan to have someone take you home from the hospital or clinic.  You may be asked to shower with a germ-killing soap. What happens during the procedure?  To reduce your risk of infection: ? Your health care team will wash or sanitize their hands. ? Your skin will be washed with soap. ? Hair may be removed from the surgical area.  Small monitors will be put on your body. They will be used to check your heart, blood pressure, and oxygen level.  An IV tube will be inserted into one of your veins.  You will be given one or more of the following: ? A medicine to help you relax (sedative). ? A medicine to numb the area (local anesthetic). ? A medicine to make you fall asleep (general anesthetic).  Leads will be guided through a blood vessel into your heart and attached to your heart muscles. Depending on the ICD, the leads may go into one ventricle or they may go into both ventricles and into an upper chamber of the heart. An X-ray machine (fluoroscope) will be usedto help guide the leads.  A small incision will be made to create a deep pocket under your  skin.  The pulse generator will be placed into the pocket.  The ICD will be tested.  The incision will be closed with stitches (sutures), skin glue, or staples.  A bandage (dressing) will be placed over the incision. This procedure may vary among health care providers and hospitals. What happens after the procedure?  Your blood pressure, heart rate, breathing rate, and blood oxygen level will be monitored often until the medicines you were given have worn off.  A chest X-ray will be taken to check that the ICD is in the right place.  You will need to stay in the hospital for 1-2 days so your health care provider can make sure your ICD is working.  Do not drive for 24 hours if you received a sedative. Ask your health care provider when it is safe for you to drive.  You may be given an identification card explaining that you have an ICD. Summary  An implantable cardioverter defibrillator (ICD) is a small device that is placed under the skin in the chest or abdomen. It is used to detect and correct dangerous irregular heartbeats (arrhythmias).  An ICD consists of a battery, a small computer (pulse generator), and wires (leads) that go into the heart.  When an ICD detects rapid heart rhythm (tachycardia), it sends a low-energy shock to the heart to restore the heartbeat to normal (cardioversion). If cardioversion does not work or if the ICD detects uncoordinated heart contractions (fibrillation), it delivers a high-energy shock to the heart (defibrillation) to restart the heart.  You will need to stay in the hospital for 1-2 days to make sure your ICD is working. This information is not intended to replace advice given to you by your health care provider. Make sure you discuss any questions you have with your health care provider. Document Revised: 03/29/2017 Document Reviewed: 04/25/2016 Elsevier Patient Education  2020 [employer].

## 2020-03-07 NOTE — Addendum Note (Signed)
Addended by: Roney Mans A on: 03/07/2020 09:01 AM   Modules accepted: Orders

## 2020-03-13 NOTE — H&P (View-Only) (Signed)
Cardiology Office Note    Date:  03/14/2020   ID:  Steve Salazar, DOB 1951-03-16, MRN [id number]  PCP:  [name], MD  Cardiologist:  [name], MD  Electrophysiologist:  [name], MD   Chief Complaint: Follow up  History of Present Illness:   Steve Salazar is a 69 y.o. male with history of HFrEF secondary to presumedNICM, paroxysmal SVT,COVID-19 infection in 03/2019,renal dysfunction, and hypertensive heart disease who presents for follow-up of his cardiomyopathy.  He was admitted to the hospital in 2011with dyspnea. Notes indicate echo showed an EF of 25-30%, diastolic dysfunction, mild to moderate mitral regurgitation, moderately dilated left atrium, and normal RV systolic function and size. Nuclear stress test in 2011 showed decreased perfusion in the basal to distal inferior wall as well as inferior apical region concerning for attenuation artifact as well as GI uptake artifact in the mid to distal inferior wall with an EF of 27%. Overall, this was read as a low risk scan. Holter monitor from 06/2009 showed 2 runs of SVT with the longest lasting 8 minutes and unable to exclude atrial flutter.  Echo from 10/2016,to evaluate his cardiomyopathy,showed a persistently low LV systolic function with an EF of 20 to 25%, diffuse hypokinesis with akinesis of the inferior myocardium, grade 1 diastolic dysfunction, mild mitral vegetation, normal RV systolic function, normal PASP. Report indicated hedevelopedawide-complex tachycardia with a rate of 116 bpm during the study. In this setting, he underwent outpatient cardiac monitoring which showed NSR with 3 episodes of SVT lasting less than 1 minute in duration with a rate up to 180bpm and no other significant arrhythmia.  He was seen in the [facility] ED in 03/2019 with dyspnea and found to have pneumonia secondary to COVID-19. He did not require hospital admission.   Despite optimization of GDMT, most  recent echo from 11/2019 demonstrated a persistent cardiomyopathy with an EF of 20-25%, global hypokinesis, mild LVH, Gr2DD, mildly reduced RVSF with mildly enlarged RV cavity size, severely dilated left atrium, moderately dilated right atrium, mild to moderate AI, moderate PR, and a mildly dilated ascending aorta.  He was seen by Dr. Mariah Milling in 12/2019 with a stable weight of 201 pounds and a BP of 70/50 with noted increase in dizziness. He reported significant perspiration at work. Spironolactone was held and Sherryll Burger was decreased by half.   He was seen in follow-up on 02/15/2020 and was doing well from a cardiac perspective.  He noted improvement in his dizziness.  He did not stop spironolactone though did decrease Entresto and temporarily hold Lasix.  At that time, he was taking carvedilol 25 mg twice daily, Entresto 49/50 mg one half tab twice daily, Lasix 40 mg daily, and spironolactone 25 mg daily.  BP was improved.  He was referred to EP for consideration of CRT-D.  He was evaluated by Dr. Lalla Brothers on 11/3 and felt to be a likely good candidate for CRT-D.  He comes in today for formal ischemic evaluation prior to this procedure being undertaken.  He comes in doing well from a cardiac perspective.  He denies any chest pain, dyspnea, palpitations, dizziness, presyncope, or syncope.  He does continue to note exertional fatigue though this is stable.  He did not titrate Entresto back up to 49/51 following his updated labs at his last visit with me.  Currently, he remains on carvedilol 25 mg twice daily, Lasix 40 mg daily, Entresto 24/26 mg daily, and spironolactone 12.5 mg daily.  His weight remains  stable.  He watches his sodium and p.o. fluid intake.  He is scheduled for CRT-D implantation on 05/13/2020.   Labs independently reviewed: 12/2019 - potassium 4.1, BUN 18, SCr 1.32 10/2019 - HGB 13.6, PLT 178, albumin 3.6, AST/ALT normal 06/2019 - TSH normal, magnesium 2.0 06/2018 - TC 183, TG 55, HDL 54, LDL  118    Past Medical History:  Diagnosis Date  . Arrhythmia    SVT verus A-fib  . CHF (congestive heart failure) (HCC)   . Hypertension   . Hypertensive cardiomyopathy (HCC)    EF 35%  . Renal insufficiency    Mild chronic    History reviewed. No pertinent surgical history.  Current Medications: Current Meds  Medication Sig  . carvedilol (COREG) 25 MG tablet Take 1 tablet (25 mg total) by mouth 2 (two) times daily with a meal.  . furosemide (LASIX) 40 MG tablet Take 40 mg by mouth daily.  . naproxen sodium (ANAPROX) 220 MG tablet Take 220 mg by mouth as needed.  . sildenafil (REVATIO) 20 MG tablet TAKE 1 TABLET BY MOUTH THREE TIMES DAILY IF NEEDED  . spironolactone (ALDACTONE) 25 MG tablet Take 12.5 mg by mouth daily.  . tamsulosin (FLOMAX) 0.4 MG CAPS capsule Take 0.4 mg by mouth daily.   . [DISCONTINUED] sacubitril-valsartan (ENTRESTO) 49-51 MG Take 1 tablet by mouth 2 (two) times daily.    Allergies:   Patient has no known allergies.   Social History   Socioeconomic History  . Marital status: Single    Spouse name: Not on file  . Number of children: Not on file  . Years of education: Not on file  . Highest education level: Not on file  Occupational History  . Occupation: Full time  Tobacco Use  . Smoking status: Former Smoker    Types: Cigarettes  . Smokeless tobacco: Never Used  . Tobacco comment: Tobaco use-no  Vaping Use  . Vaping Use: Never used  Substance and Sexual Activity  . Alcohol use: Yes    Alcohol/week: 2.0 standard drinks    Types: 2 Cans of beer per week    Comment: ocassional  . Drug use: No  . Sexual activity: Not on file  Other Topics Concern  . Not on file  Social History Narrative   Pt gets regular exercise.   Social Determinants of Health   Financial Resource Strain:   . Difficulty of Paying Living Expenses: Not on file  Food Insecurity:   . Worried About Programme researcher, broadcasting/film/video in the Last Year: Not on file  . Ran Out of Food in  the Last Year: Not on file  Transportation Needs:   . Lack of Transportation (Medical): Not on file  . Lack of Transportation (Non-Medical): Not on file  Physical Activity:   . Days of Exercise per Week: Not on file  . Minutes of Exercise per Session: Not on file  Stress:   . Feeling of Stress : Not on file  Social Connections:   . Frequency of Communication with Friends and Family: Not on file  . Frequency of Social Gatherings with Friends and Family: Not on file  . Attends Religious Services: Not on file  . Active Member of Clubs or Organizations: Not on file  . Attends Banker Meetings: Not on file  . Marital Status: Not on file     Family History:  The patient's family history includes Heart attack in his father.  ROS:   Review of Systems  Constitutional: Positive for malaise/fatigue. Negative for chills, diaphoresis, fever and weight loss.  HENT: Negative for congestion.   Eyes: Negative for discharge and redness.  Respiratory: Negative for cough, sputum production, shortness of breath and wheezing.   Cardiovascular: Negative for chest pain, palpitations, orthopnea, claudication, leg swelling and PND.  Gastrointestinal: Negative for abdominal pain, heartburn, nausea and vomiting.  Musculoskeletal: Negative for falls and myalgias.  Skin: Negative for rash.  Neurological: Positive for weakness. Negative for dizziness, tingling, tremors, sensory change, speech change, focal weakness and loss of consciousness.  Endo/Heme/Allergies: Does not bruise/bleed easily.  Psychiatric/Behavioral: Negative for substance abuse. The patient is not nervous/anxious.   All other systems reviewed and are negative.    EKGs/Labs/Other Studies Reviewed:    Studies reviewed were summarized above. The additional studies were reviewed today:  2D echo 12/22/2019: 1. Left ventricular ejection fraction, by estimation, is 20 to 25%. The  left ventricle has severely decreased function.  The left ventricle  demonstrates global hypokinesis. The left ventricular internal cavity size  was severely dilated. There is mild  left ventricular hypertrophy. Left ventricular diastolic parameters are  consistent with Grade II diastolic dysfunction (pseudonormalization).  Elevated left atrial pressure.  2. Right ventricular systolic function is mildly reduced. The right  ventricular size is mildly enlarged. Tricuspid regurgitation signal is  inadequate for assessing PA pressure.  3. Left atrial size was severely dilated.  4. Right atrial size was moderately dilated.  5. The mitral valve is normal in structure. Moderate mitral valve  regurgitation. No evidence of mitral stenosis.  6. The aortic valve is tricuspid. Aortic valve regurgitation is mild to  moderate. No aortic stenosis is present.  7. Pulmonic valve regurgitation is moderate.  8. Aortic dilatation noted. There is mild dilatation of the ascending  aorta measuring 42 mm.  9. The inferior vena cava is normal in size with greater than 50%  respiratory variability, suggesting right atrial pressure of 3 mmHg. __________  Eugenie Birks MPI 12/18/2019: Pharmacological myocardial perfusion imaging study with no significant Ischemia Fixed inferolateral defect consistent with prior MI Hypokinesis of the inferolateral wall, EF estimated at 17% No EKG changes concerning for ischemia at peak stress or in recovery. High risk scan secondary to cardiomyopathy __________  Event monitor 12/2016: Normal sinus rhythm 3 episodes of SVT lasting <1 min Rate up to 180 bpm  No other significant arrhythmia __________  2D echo 10/2016: - Left ventricle: The cavity size was moderately dilated. Systolic  function was severely reduced. The estimated ejection fraction  was in the range of 20% to 25%. Diffuse hypokinesis. Akinesis of  the inferior myocardium. Doppler parameters are consistent with  abnormal left ventricular  relaxation (grade 1 diastolic  dysfunction).  - Mitral valve: There was mild regurgitation.  - Left atrium: The atrium was normal in size.  - Right ventricle: Systolic function was normal.  - Pulmonary arteries: Systolic pressure was within the normal  range.   Impressions:   - Patient developed wide complex tachycardia, rate 116 bpm during  the study.    EKG:  EKG is ordered today.  The EKG ordered today demonstrates NSR, 71 bpm, rare PVC, LBBB (known)  Recent Labs: 06/01/2019: Magnesium 2.0; TSH 3.100 02/15/2020: BUN 17; Creatinine, Ser 1.25; Potassium 4.2; Sodium 141  Recent Lipid Panel    Component Value Date/Time   CHOL 208 (H) 01/26/2013 0826   TRIG 80 01/26/2013 0826   HDL 63 01/26/2013 0826   CHOLHDL 3.3 01/26/2013 0826   LDLCALC 129 (  H) 01/26/2013 1610    PHYSICAL EXAM:    VS:  BP 110/72 (BP Location: Left Arm, Patient Position: Sitting, Cuff Size: Normal)   Pulse 71   Ht  (1.803 m)   Wt 204 lb (92.5 kg)   SpO2 96%   BMI 28.45 kg/m   BMI: Body mass index is 28.45 kg/m.  Physical Exam Constitutional:      Appearance: He is well-developed.  HENT:     Head: Normocephalic and atraumatic.  Eyes:     General:        Right eye: No discharge.        Left eye: No discharge.  Neck:     Vascular: No JVD.  Cardiovascular:     Rate and Rhythm: Normal rate and regular rhythm.     Pulses: No midsystolic click and no opening snap.          Posterior tibial pulses are 2+ on the right side and 2+ on the left side.     Heart sounds: Normal heart sounds, S1 normal and S2 normal. Heart sounds not distant. No murmur heard.  No friction rub.  Pulmonary:     Effort: Pulmonary effort is normal. No respiratory distress.     Breath sounds: Normal breath sounds. No decreased breath sounds, wheezing or rales.  Chest:     Chest wall: No tenderness.  Abdominal:     General: There is no distension.     Palpations: Abdomen is soft.     Tenderness: There is no  abdominal tenderness.  Musculoskeletal:     Cervical back: Normal range of motion.  Skin:    General: Skin is warm and dry.     Nails: There is no clubbing.  Neurological:     Mental Status: He is alert and oriented to person, place, and time.  Psychiatric:        Speech: Speech normal.        Behavior: Behavior normal.        Thought Content: Thought content normal.        Judgment: Judgment normal.     Wt Readings from Last 3 Encounters:  03/14/20 204 lb (92.5 kg)  03/02/20 205 lb 3.2 oz (93.1 kg)  02/15/20 204 lb (92.5 kg)     ASSESSMENT & PLAN:   1. HFrEF secondary to presumed NICM with underlying LBBB: He appears euvolemic and well compensated with NYHA class I-II symptoms.  He remains very active at work though does have significant exertional fatigue.  He has been evaluated by EP with recommendation for potential CRT-D on 05/13/2020 given his underlying cardiomyopathy and LBBB.  We will plan to proceed with diagnostic R/LHC prior to device implantation to definitively exclude ischemic cardiomyopathy as an etiology.  He did not increase Entresto following his most recent updated labs and has remained on 24/26 mg twice daily along with carvedilol 25 mg twice daily, Lasix 40 mg daily, and spironolactone 12.5 mg daily.  Given BP today and at his recent EP visit we will defer further escalation of GDMT in an effort to prevent significant hypotension.  CHF education discussed. Risks and benefits of cardiac catheterization have been discussed with the patient including risks of bleeding, bruising, infection, kidney damage, stroke, heart attack, injury to a limb, urgent need for bypass, and death. The patient understands these risks and is willing to proceed with the procedure. All questions have been answered and concerns listened to.    2. Paroxysmal SVT: Symptoms  are well controlled with carvedilol 25 mg twice daily.  3. Hypertensive heart disease: Blood pressure is well controlled  today.  Continue current medications as outlined above.  Low-sodium diet recommended.  4. Renal dysfunction: Stable on most recent renal function.  5. History of COVID-19: No significant lingering adverse effects.  Disposition: F/u with Dr. Mariah Milling or an APP in 3 to 4 weeks following diagnostic R/LHC, and EP as directed.   Medication Adjustments/Labs and Tests Ordered: Current medicines are reviewed at length with the patient today.  Concerns regarding medicines are outlined above. Medication changes, Labs and Tests ordered today are summarized above and listed in the Patient Instructions accessible in Encounters.   Signed, Eula Listen, PA-C 03/14/2020 2:42 PM     [facility] [address] [place], [place] [address] [phone]

## 2020-03-13 NOTE — Progress Notes (Signed)
Cardiology Office Note    Date:  03/14/2020   ID:  Steve Salazar, DOB 1951-03-16, MRN [id number]  PCP:  [name], MD  Cardiologist:  [name], MD  Electrophysiologist:  [name], MD   Chief Complaint: Follow up  History of Present Illness:   Steve Salazar is a 69 y.o. male with history of HFrEF secondary to presumedNICM, paroxysmal SVT,COVID-19 infection in 03/2019,renal dysfunction, and hypertensive heart disease who presents for follow-up of his cardiomyopathy.  He was admitted to the hospital in 2011with dyspnea. Notes indicate echo showed an EF of 25-30%, diastolic dysfunction, mild to moderate mitral regurgitation, moderately dilated left atrium, and normal RV systolic function and size. Nuclear stress test in 2011 showed decreased perfusion in the basal to distal inferior wall as well as inferior apical region concerning for attenuation artifact as well as GI uptake artifact in the mid to distal inferior wall with an EF of 27%. Overall, this was read as a low risk scan. Holter monitor from 06/2009 showed 2 runs of SVT with the longest lasting 8 minutes and unable to exclude atrial flutter.  Echo from 10/2016,to evaluate his cardiomyopathy,showed a persistently low LV systolic function with an EF of 20 to 25%, diffuse hypokinesis with akinesis of the inferior myocardium, grade 1 diastolic dysfunction, mild mitral vegetation, normal RV systolic function, normal PASP. Report indicated hedevelopedawide-complex tachycardia with a rate of 116 bpm during the study. In this setting, he underwent outpatient cardiac monitoring which showed NSR with 3 episodes of SVT lasting less than 1 minute in duration with a rate up to 180bpm and no other significant arrhythmia.  He was seen in the [facility] ED in 03/2019 with dyspnea and found to have pneumonia secondary to COVID-19. He did not require hospital admission.   Despite optimization of GDMT, most  recent echo from 11/2019 demonstrated a persistent cardiomyopathy with an EF of 20-25%, global hypokinesis, mild LVH, Gr2DD, mildly reduced RVSF with mildly enlarged RV cavity size, severely dilated left atrium, moderately dilated right atrium, mild to moderate AI, moderate PR, and a mildly dilated ascending aorta.  He was seen by Dr. Mariah Milling in 12/2019 with a stable weight of 201 pounds and a BP of 70/50 with noted increase in dizziness. He reported significant perspiration at work. Spironolactone was held and Sherryll Burger was decreased by half.   He was seen in follow-up on 02/15/2020 and was doing well from a cardiac perspective.  He noted improvement in his dizziness.  He did not stop spironolactone though did decrease Entresto and temporarily hold Lasix.  At that time, he was taking carvedilol 25 mg twice daily, Entresto 49/50 mg one half tab twice daily, Lasix 40 mg daily, and spironolactone 25 mg daily.  BP was improved.  He was referred to EP for consideration of CRT-D.  He was evaluated by Dr. Lalla Brothers on 11/3 and felt to be a likely good candidate for CRT-D.  He comes in today for formal ischemic evaluation prior to this procedure being undertaken.  He comes in doing well from a cardiac perspective.  He denies any chest pain, dyspnea, palpitations, dizziness, presyncope, or syncope.  He does continue to note exertional fatigue though this is stable.  He did not titrate Entresto back up to 49/51 following his updated labs at his last visit with me.  Currently, he remains on carvedilol 25 mg twice daily, Lasix 40 mg daily, Entresto 24/26 mg daily, and spironolactone 12.5 mg daily.  His weight remains  stable.  He watches his sodium and p.o. fluid intake.  He is scheduled for CRT-D implantation on 05/13/2020.   Labs independently reviewed: 12/2019 - potassium 4.1, BUN 18, SCr 1.32 10/2019 - HGB 13.6, PLT 178, albumin 3.6, AST/ALT normal 06/2019 - TSH normal, magnesium 2.0 06/2018 - TC 183, TG 55, HDL 54, LDL  118    Past Medical History:  Diagnosis Date  . Arrhythmia    SVT verus A-fib  . CHF (congestive heart failure) (HCC)   . Hypertension   . Hypertensive cardiomyopathy (HCC)    EF 35%  . Renal insufficiency    Mild chronic    History reviewed. No pertinent surgical history.  Current Medications: Current Meds  Medication Sig  . carvedilol (COREG) 25 MG tablet Take 1 tablet (25 mg total) by mouth 2 (two) times daily with a meal.  . furosemide (LASIX) 40 MG tablet Take 40 mg by mouth daily.  . naproxen sodium (ANAPROX) 220 MG tablet Take 220 mg by mouth as needed.  . sildenafil (REVATIO) 20 MG tablet TAKE 1 TABLET BY MOUTH THREE TIMES DAILY IF NEEDED  . spironolactone (ALDACTONE) 25 MG tablet Take 12.5 mg by mouth daily.  . tamsulosin (FLOMAX) 0.4 MG CAPS capsule Take 0.4 mg by mouth daily.   . [DISCONTINUED] sacubitril-valsartan (ENTRESTO) 49-51 MG Take 1 tablet by mouth 2 (two) times daily.    Allergies:   Patient has no known allergies.   Social History   Socioeconomic History  . Marital status: Single    Spouse name: Not on file  . Number of children: Not on file  . Years of education: Not on file  . Highest education level: Not on file  Occupational History  . Occupation: Full time  Tobacco Use  . Smoking status: Former Smoker    Types: Cigarettes  . Smokeless tobacco: Never Used  . Tobacco comment: Tobaco use-no  Vaping Use  . Vaping Use: Never used  Substance and Sexual Activity  . Alcohol use: Yes    Alcohol/week: 2.0 standard drinks    Types: 2 Cans of beer per week    Comment: ocassional  . Drug use: No  . Sexual activity: Not on file  Other Topics Concern  . Not on file  Social History Narrative   Pt gets regular exercise.   Social Determinants of Health   Financial Resource Strain:   . Difficulty of Paying Living Expenses: Not on file  Food Insecurity:   . Worried About Programme researcher, broadcasting/film/video in the Last Year: Not on file  . Ran Out of Food in  the Last Year: Not on file  Transportation Needs:   . Lack of Transportation (Medical): Not on file  . Lack of Transportation (Non-Medical): Not on file  Physical Activity:   . Days of Exercise per Week: Not on file  . Minutes of Exercise per Session: Not on file  Stress:   . Feeling of Stress : Not on file  Social Connections:   . Frequency of Communication with Friends and Family: Not on file  . Frequency of Social Gatherings with Friends and Family: Not on file  . Attends Religious Services: Not on file  . Active Member of Clubs or Organizations: Not on file  . Attends Banker Meetings: Not on file  . Marital Status: Not on file     Family History:  The patient's family history includes Heart attack in his father.  ROS:   Review of Systems  Constitutional: Positive for malaise/fatigue. Negative for chills, diaphoresis, fever and weight loss.  HENT: Negative for congestion.   Eyes: Negative for discharge and redness.  Respiratory: Negative for cough, sputum production, shortness of breath and wheezing.   Cardiovascular: Negative for chest pain, palpitations, orthopnea, claudication, leg swelling and PND.  Gastrointestinal: Negative for abdominal pain, heartburn, nausea and vomiting.  Musculoskeletal: Negative for falls and myalgias.  Skin: Negative for rash.  Neurological: Positive for weakness. Negative for dizziness, tingling, tremors, sensory change, speech change, focal weakness and loss of consciousness.  Endo/Heme/Allergies: Does not bruise/bleed easily.  Psychiatric/Behavioral: Negative for substance abuse. The patient is not nervous/anxious.   All other systems reviewed and are negative.    EKGs/Labs/Other Studies Reviewed:    Studies reviewed were summarized above. The additional studies were reviewed today:  2D echo 12/22/2019: 1. Left ventricular ejection fraction, by estimation, is 20 to 25%. The  left ventricle has severely decreased function.  The left ventricle  demonstrates global hypokinesis. The left ventricular internal cavity size  was severely dilated. There is mild  left ventricular hypertrophy. Left ventricular diastolic parameters are  consistent with Grade II diastolic dysfunction (pseudonormalization).  Elevated left atrial pressure.  2. Right ventricular systolic function is mildly reduced. The right  ventricular size is mildly enlarged. Tricuspid regurgitation signal is  inadequate for assessing PA pressure.  3. Left atrial size was severely dilated.  4. Right atrial size was moderately dilated.  5. The mitral valve is normal in structure. Moderate mitral valve  regurgitation. No evidence of mitral stenosis.  6. The aortic valve is tricuspid. Aortic valve regurgitation is mild to  moderate. No aortic stenosis is present.  7. Pulmonic valve regurgitation is moderate.  8. Aortic dilatation noted. There is mild dilatation of the ascending  aorta measuring 42 mm.  9. The inferior vena cava is normal in size with greater than 50%  respiratory variability, suggesting right atrial pressure of 3 mmHg. __________  Eugenie Birks MPI 12/18/2019: Pharmacological myocardial perfusion imaging study with no significant Ischemia Fixed inferolateral defect consistent with prior MI Hypokinesis of the inferolateral wall, EF estimated at 17% No EKG changes concerning for ischemia at peak stress or in recovery. High risk scan secondary to cardiomyopathy __________  Event monitor 12/2016: Normal sinus rhythm 3 episodes of SVT lasting <1 min Rate up to 180 bpm  No other significant arrhythmia __________  2D echo 10/2016: - Left ventricle: The cavity size was moderately dilated. Systolic  function was severely reduced. The estimated ejection fraction  was in the range of 20% to 25%. Diffuse hypokinesis. Akinesis of  the inferior myocardium. Doppler parameters are consistent with  abnormal left ventricular  relaxation (grade 1 diastolic  dysfunction).  - Mitral valve: There was mild regurgitation.  - Left atrium: The atrium was normal in size.  - Right ventricle: Systolic function was normal.  - Pulmonary arteries: Systolic pressure was within the normal  range.   Impressions:   - Patient developed wide complex tachycardia, rate 116 bpm during  the study.    EKG:  EKG is ordered today.  The EKG ordered today demonstrates NSR, 71 bpm, rare PVC, LBBB (known)  Recent Labs: 06/01/2019: Magnesium 2.0; TSH 3.100 02/15/2020: BUN 17; Creatinine, Ser 1.25; Potassium 4.2; Sodium 141  Recent Lipid Panel    Component Value Date/Time   CHOL 208 (H) 01/26/2013 0826   TRIG 80 01/26/2013 0826   HDL 63 01/26/2013 0826   CHOLHDL 3.3 01/26/2013 0826   LDLCALC 129 (  H) 01/26/2013 0826    PHYSICAL EXAM:    VS:  BP 110/72 (BP Location: Left Arm, Patient Position: Sitting, Cuff Size: Normal)   Pulse 71   Ht 5' 11" (1.803 m)   Wt 204 lb (92.5 kg)   SpO2 96%   BMI 28.45 kg/m   BMI: Body mass index is 28.45 kg/m.  Physical Exam Constitutional:      Appearance: He is well-developed.  HENT:     Head: Normocephalic and atraumatic.  Eyes:     General:        Right eye: No discharge.        Left eye: No discharge.  Neck:     Vascular: No JVD.  Cardiovascular:     Rate and Rhythm: Normal rate and regular rhythm.     Pulses: No midsystolic click and no opening snap.          Posterior tibial pulses are 2+ on the right side and 2+ on the left side.     Heart sounds: Normal heart sounds, S1 normal and S2 normal. Heart sounds not distant. No murmur heard.  No friction rub.  Pulmonary:     Effort: Pulmonary effort is normal. No respiratory distress.     Breath sounds: Normal breath sounds. No decreased breath sounds, wheezing or rales.  Chest:     Chest wall: No tenderness.  Abdominal:     General: There is no distension.     Palpations: Abdomen is soft.     Tenderness: There is no  abdominal tenderness.  Musculoskeletal:     Cervical back: Normal range of motion.  Skin:    General: Skin is warm and dry.     Nails: There is no clubbing.  Neurological:     Mental Status: He is alert and oriented to person, place, and time.  Psychiatric:        Speech: Speech normal.        Behavior: Behavior normal.        Thought Content: Thought content normal.        Judgment: Judgment normal.     Wt Readings from Last 3 Encounters:  03/14/20 204 lb (92.5 kg)  03/02/20 205 lb 3.2 oz (93.1 kg)  02/15/20 204 lb (92.5 kg)     ASSESSMENT & PLAN:   1. HFrEF secondary to presumed NICM with underlying LBBB: He appears euvolemic and well compensated with NYHA class I-II symptoms.  He remains very active at work though does have significant exertional fatigue.  He has been evaluated by EP with recommendation for potential CRT-D on 05/13/2020 given his underlying cardiomyopathy and LBBB.  We will plan to proceed with diagnostic R/LHC prior to device implantation to definitively exclude ischemic cardiomyopathy as an etiology.  He did not increase Entresto following his most recent updated labs and has remained on 24/26 mg twice daily along with carvedilol 25 mg twice daily, Lasix 40 mg daily, and spironolactone 12.5 mg daily.  Given BP today and at his recent EP visit we will defer further escalation of GDMT in an effort to prevent significant hypotension.  CHF education discussed. Risks and benefits of cardiac catheterization have been discussed with the patient including risks of bleeding, bruising, infection, kidney damage, stroke, heart attack, injury to a limb, urgent need for bypass, and death. The patient understands these risks and is willing to proceed with the procedure. All questions have been answered and concerns listened to.    2. Paroxysmal SVT: Symptoms   are well controlled with carvedilol 25 mg twice daily.  3. Hypertensive heart disease: Blood pressure is well controlled  today.  Continue current medications as outlined above.  Low-sodium diet recommended.  4. Renal dysfunction: Stable on most recent renal function.  5. History of COVID-19: No significant lingering adverse effects.  Disposition: F/u with Dr. Mariah Milling or an APP in 3 to 4 weeks following diagnostic R/LHC, and EP as directed.   Medication Adjustments/Labs and Tests Ordered: Current medicines are reviewed at length with the patient today.  Concerns regarding medicines are outlined above. Medication changes, Labs and Tests ordered today are summarized above and listed in the Patient Instructions accessible in Encounters.   Signed, Eula Listen, PA-C 03/14/2020 2:42 PM     [facility] [place] [address] [place], [place] [address] [phone]

## 2020-03-14 ENCOUNTER — Other Ambulatory Visit: Payer: Self-pay

## 2020-03-14 ENCOUNTER — Encounter: Payer: Self-pay | Admitting: Physician Assistant

## 2020-03-14 ENCOUNTER — Ambulatory Visit (INDEPENDENT_AMBULATORY_CARE_PROVIDER_SITE_OTHER): Payer: Medicare HMO | Admitting: Physician Assistant

## 2020-03-14 VITALS — BP 110/72 | HR 71 | Ht 71.0 in | Wt 204.0 lb

## 2020-03-14 DIAGNOSIS — I428 Other cardiomyopathies: Secondary | ICD-10-CM

## 2020-03-14 DIAGNOSIS — I420 Dilated cardiomyopathy: Secondary | ICD-10-CM | POA: Diagnosis not present

## 2020-03-14 DIAGNOSIS — Z8616 Personal history of COVID-19: Secondary | ICD-10-CM

## 2020-03-14 DIAGNOSIS — I447 Left bundle-branch block, unspecified: Secondary | ICD-10-CM

## 2020-03-14 DIAGNOSIS — I110 Hypertensive heart disease with heart failure: Secondary | ICD-10-CM

## 2020-03-14 DIAGNOSIS — I471 Supraventricular tachycardia, unspecified: Secondary | ICD-10-CM

## 2020-03-14 DIAGNOSIS — I5022 Chronic systolic (congestive) heart failure: Secondary | ICD-10-CM

## 2020-03-14 DIAGNOSIS — N289 Disorder of kidney and ureter, unspecified: Secondary | ICD-10-CM

## 2020-03-14 MED ORDER — ENTRESTO 24-26 MG PO TABS: 1.0000 | ORAL_TABLET | Freq: Two times a day (BID) | ORAL | 1 refills | Status: DC

## 2020-03-14 NOTE — Patient Instructions (Addendum)
Medication Instructions:  Take Entresto 24-26mg  twice daily.   *If you need a refill on your cardiac medications before your next appointment, please call your pharmacy*   Lab Work: BMP and CBC to be drawn in our office today.   If you have labs (blood work) drawn today and your tests are completely normal, you will receive your results only by: Marland Kitchen MyChart Message (if you have MyChart) OR . A paper copy in the mail If you have any lab test that is abnormal or we need to change your treatment, we will call you to review the results.  Testing/Procedures: You are scheduled for a Cardiac Catheterization on Thursday, December 2 with Dr. [name].  1. Please arrive at the [facility] entrance at 8:30 AM (This time is one hour before your procedure to ensure your preparation). Free valet parking service is available.   Special note: Every effort is made to have your procedure done on time. Please understand that emergencies sometimes delay scheduled procedures.  2. Diet: Do not eat solid foods after midnight.  The patient may have clear liquids until 5am upon the day of the procedure.  3. Labs: You will need to have blood drawn today. You do not need to be fasting.  COVID drive thru testing at the [facility] on Tuesday, November 30th anytime between 8am and 1pm.  4. Medication instructions in preparation for your procedure:   Contrast Allergy: No  Do not take furosemide or spironolactone before your procedure on March 31, 2020.  On the morning of your procedure, take your Aspirin 81mg  and any morning medicines NOT listed above.  You may use sips of water.  5. Plan for one night stay--bring personal belongings. 6. Bring a current list of your medications and current insurance cards. 7. You MUST have a responsible person to drive you home. 8. Someone MUST be with you the first 24 hours after you arrive home or your discharge will be delayed. 9. Please wear clothes  that are easy to get on and off and wear slip-on shoes.  Thank you for allowing to care for you!   -- [facility]  Follow-Up: At [facility], you and your health needs are our priority.  As part of our continuing mission to provide you with exceptional heart care, we have created designated Provider Care Teams.  These Care Teams include your primary Cardiologist (physician) and Advanced Practice Providers (APPs -  Physician Assistants and Nurse Practitioners) who all work together to provide you with the care you need, when you need it.  We recommend signing up for the patient portal called "MyChart".  Sign up information is provided on this After Visit Summary.  MyChart is used to connect with patients for Virtual Visits (Telemedicine).  Patients are able to view lab/test results, encounter notes, upcoming appointments, etc.  Non-urgent messages can be sent to your provider as well.   To learn more about what you can do with MyChart, go to [facility].    Your next appointment:   2 week(s) after cardiac cath  The format for your next appointment:   In Person  Provider:   You may see [email or web address], MD or one of the following Advanced Practice Providers on your designated Care Team:    [name], NP  Nicolasa Ducking, PA-C  Eula Listen, PA-C  [place], [place]  [place], NP

## 2020-03-15 LAB — CBC WITH DIFFERENTIAL/PLATELET
Basophils Absolute: 0.1 10*3/uL (ref 0.0–0.2)
Basos: 1 %
EOS (ABSOLUTE): 0.1 10*3/uL (ref 0.0–0.4)
Eos: 2 %
Hematocrit: 40.2 % (ref 37.5–51.0)
Hemoglobin: 12.9 g/dL — ABNORMAL LOW (ref 13.0–17.7)
Immature Grans (Abs): 0 10*3/uL (ref 0.0–0.1)
Immature Granulocytes: 0 %
Lymphocytes Absolute: 1 10*3/uL (ref 0.7–3.1)
Lymphs: 22 %
MCH: 30.1 pg (ref 26.6–33.0)
MCHC: 32.1 g/dL (ref 31.5–35.7)
MCV: 94 fL (ref 79–97)
Monocytes Absolute: 0.4 10*3/uL (ref 0.1–0.9)
Monocytes: 9 %
Neutrophils Absolute: 3.2 10*3/uL (ref 1.4–7.0)
Neutrophils: 66 %
Platelets: 162 10*3/uL (ref 150–450)
RBC: 4.29 x10E6/uL (ref 4.14–5.80)
RDW: 13.2 % (ref 11.6–15.4)
WBC: 4.7 10*3/uL (ref 3.4–10.8)

## 2020-03-15 LAB — BASIC METABOLIC PANEL
BUN/Creatinine Ratio: 14 (ref 10–24)
BUN: 16 mg/dL (ref 8–27)
CO2: 20 mmol/L (ref 20–29)
Calcium: 8.8 mg/dL (ref 8.6–10.2)
Chloride: 100 mmol/L (ref 96–106)
Creatinine, Ser: 1.14 mg/dL (ref 0.76–1.27)
GFR calc Af Amer: 75 mL/min/{1.73_m2} (ref >59)
GFR calc non Af Amer: 65 mL/min/{1.73_m2} (ref >59)
Glucose: 95 mg/dL (ref 65–99)
Potassium: 4.5 mmol/L (ref 3.5–5.2)
Sodium: 141 mmol/L (ref 134–144)

## 2020-03-29 ENCOUNTER — Other Ambulatory Visit: Admission: RE | Admit: 2020-03-29 | Payer: Medicare HMO | Source: Ambulatory Visit

## 2020-03-31 ENCOUNTER — Other Ambulatory Visit: Payer: Self-pay

## 2020-03-31 ENCOUNTER — Telehealth: Payer: Self-pay

## 2020-03-31 ENCOUNTER — Other Ambulatory Visit
Admission: RE | Admit: 2020-03-31 | Discharge: 2020-03-31 | Disposition: A | Discharge status: Home / Self Care | Payer: Medicare HMO | Source: Ambulatory Visit | Attending: Cardiovascular Disease | Admitting: Cardiovascular Disease

## 2020-03-31 DIAGNOSIS — I5020 Unspecified systolic (congestive) heart failure: Secondary | ICD-10-CM

## 2020-03-31 DIAGNOSIS — Z20822 Contact with and (suspected) exposure to covid-19: Secondary | ICD-10-CM | POA: Diagnosis not present

## 2020-03-31 DIAGNOSIS — Z01812 Encounter for preprocedural laboratory examination: Secondary | ICD-10-CM | POA: Diagnosis present

## 2020-03-31 LAB — SARS CORONAVIRUS 2 (TAT 6-24 HRS): SARS Coronavirus 2: NEGATIVE

## 2020-03-31 NOTE — Telephone Encounter (Signed)
Confirmed with [facility] scheduling that the patient's cath is scheduled for 12:30 pm tomorrow- he will need to arrive at 11:30 am.  I have attempted to call the patient. No answer- there is no voice mail set up.   Will call back at a later time.

## 2020-03-31 NOTE — Telephone Encounter (Signed)
I called and was able to speak with the patient just now. I advised we were calling to r/s his heart cath to tomorrow per Dr. Mariah Milling as he did not have his COVID test done on 03/29/20.  Per the patient, he was just leaving from getting his COVID test. He is aware I need to confirm an arrival time for tomorrow and will call him back.  I attempted to call scheduling and had to leave a message to please call back.

## 2020-03-31 NOTE — Telephone Encounter (Signed)
Attempted to reach the patient again. I was able to speak with him by phone.  I have confirmed with him that he will need to come in tomorrow at 11:30 am to the [facility], 1st desk on the right to check in.  I have confirmed with the patient that all other pre-procedure instructions will apply. He voices confirmation that he still has these in his possession and has no other questions at this time.

## 2020-03-31 NOTE — Telephone Encounter (Addendum)
Patient was scheduled for a cardiac cath today with Dr. Mariah Milling. He no showed for his 03/29/20 COVID test appt.  Per Dr. Mariah Milling the patients cath should be rescheduled for tomorrow 04/01/20 any available time. He should have his COVID test done first thing this morning at the [facility] drive up test site to ensure that the results will be available for tomorrow.  Attempted to contact the patient x2. Patients voice mail is not set up. Unable to lmtcb.

## 2020-04-01 ENCOUNTER — Encounter: Payer: Self-pay | Admitting: Cardiovascular Disease

## 2020-04-01 ENCOUNTER — Ambulatory Visit
Admission: RE | Admit: 2020-04-01 | Discharge: 2020-04-01 | Disposition: A | Discharge status: Home / Self Care | Payer: Medicare HMO | Source: Ambulatory Visit | Attending: Cardiovascular Disease | Admitting: Cardiovascular Disease

## 2020-04-01 ENCOUNTER — Encounter
Admission: RE | Disposition: A | Discharge status: Home / Self Care | Payer: Self-pay | Source: Ambulatory Visit | Attending: Cardiovascular Disease

## 2020-04-01 DIAGNOSIS — I5022 Chronic systolic (congestive) heart failure: Secondary | ICD-10-CM | POA: Diagnosis not present

## 2020-04-01 DIAGNOSIS — I110 Hypertensive heart disease with heart failure: Secondary | ICD-10-CM | POA: Diagnosis not present

## 2020-04-01 DIAGNOSIS — I21A1 Myocardial infarction type 2: Secondary | ICD-10-CM

## 2020-04-01 DIAGNOSIS — Z79899 Other long term (current) drug therapy: Secondary | ICD-10-CM | POA: Diagnosis not present

## 2020-04-01 DIAGNOSIS — Z87891 Personal history of nicotine dependence: Secondary | ICD-10-CM | POA: Insufficient documentation

## 2020-04-01 DIAGNOSIS — I5020 Unspecified systolic (congestive) heart failure: Secondary | ICD-10-CM

## 2020-04-01 DIAGNOSIS — I471 Supraventricular tachycardia: Secondary | ICD-10-CM | POA: Insufficient documentation

## 2020-04-01 DIAGNOSIS — Z8616 Personal history of COVID-19: Secondary | ICD-10-CM | POA: Insufficient documentation

## 2020-04-01 DIAGNOSIS — I428 Other cardiomyopathies: Secondary | ICD-10-CM | POA: Diagnosis not present

## 2020-04-01 DIAGNOSIS — I447 Left bundle-branch block, unspecified: Secondary | ICD-10-CM | POA: Insufficient documentation

## 2020-04-01 HISTORY — PX: RIGHT/LEFT HEART CATH AND CORONARY ANGIOGRAPHY: CATH118266

## 2020-04-01 SURGERY — RIGHT/LEFT HEART CATH AND CORONARY ANGIOGRAPHY
Anesthesia: Moderate Sedation | Laterality: Bilateral

## 2020-04-01 MED ORDER — FENTANYL CITRATE (PF) 100 MCG/2ML IJ SOLN
INTRAMUSCULAR | Status: DC | PRN
  Administered 2020-04-01: 50 ug via INTRAVENOUS

## 2020-04-01 MED ORDER — SODIUM CHLORIDE 0.9% FLUSH: 3.0000 mL | INTRAVENOUS | Status: DC | PRN

## 2020-04-01 MED ORDER — HEPARIN (PORCINE) IN NACL 1000-0.9 UT/500ML-% IV SOLN
INTRAVENOUS | Status: DC | PRN
  Administered 2020-04-01: 500 mL

## 2020-04-01 MED ORDER — ASPIRIN 81 MG PO CHEW
CHEWABLE_TABLET | ORAL | Status: AC
  Filled 2020-04-01: qty 1

## 2020-04-01 MED ORDER — MIDAZOLAM HCL 2 MG/2ML IJ SOLN
INTRAMUSCULAR | Status: AC
  Filled 2020-04-01: qty 2

## 2020-04-01 MED ORDER — SODIUM CHLORIDE 0.9% FLUSH: 3.0000 mL | Freq: Two times a day (BID) | INTRAVENOUS | Status: DC

## 2020-04-01 MED ORDER — FENTANYL CITRATE (PF) 100 MCG/2ML IJ SOLN
INTRAMUSCULAR | Status: AC
  Filled 2020-04-01: qty 2

## 2020-04-01 MED ORDER — ASPIRIN 81 MG PO CHEW
81.0000 mg | CHEWABLE_TABLET | ORAL | Status: AC
  Administered 2020-04-01: 81 mg via ORAL

## 2020-04-01 MED ORDER — IOHEXOL 300 MG/ML  SOLN
INTRAMUSCULAR | Status: DC | PRN
  Administered 2020-04-01: 150 mL

## 2020-04-01 MED ORDER — MIDAZOLAM HCL 2 MG/2ML IJ SOLN
INTRAMUSCULAR | Status: DC | PRN
  Administered 2020-04-01: 1 mg via INTRAVENOUS

## 2020-04-01 MED ORDER — LIDOCAINE HCL (PF) 1 % IJ SOLN
INTRAMUSCULAR | Status: DC | PRN
  Administered 2020-04-01: 20 mL

## 2020-04-01 MED ORDER — LIDOCAINE HCL (PF) 1 % IJ SOLN
INTRAMUSCULAR | Status: AC
  Filled 2020-04-01: qty 30

## 2020-04-01 MED ORDER — HEPARIN (PORCINE) IN NACL 1000-0.9 UT/500ML-% IV SOLN
INTRAVENOUS | Status: AC
  Filled 2020-04-01: qty 1000

## 2020-04-01 MED ORDER — SODIUM CHLORIDE 0.9 % IV SOLN
INTRAVENOUS | Status: DC
  Administered 2020-04-01: 1000 mL via INTRAVENOUS

## 2020-04-01 MED ORDER — SODIUM CHLORIDE 0.9 % IV SOLN: 250.0000 mL | INTRAVENOUS | Status: DC | PRN

## 2020-04-01 SURGICAL SUPPLY — 16 items
CATH INFINITI 5FR JL4 (CATHETERS) ×3 IMPLANT
CATH INFINITI 5FR JL5 (CATHETERS) ×6 IMPLANT
CATH INFINITI JR4 5F (CATHETERS) ×3 IMPLANT
CATH SWAN GANZ 7F STRAIGHT (CATHETERS) ×3 IMPLANT
DEVICE CLOSURE MYNXGRIP 5F (Vascular Products) ×3 IMPLANT
GUIDEWIRE EMER 3M J .025X150CM (WIRE) ×3 IMPLANT
KIT MANI 3VAL PERCEP (MISCELLANEOUS) ×3 IMPLANT
NEEDLE PERC 18GX7CM (NEEDLE) ×3 IMPLANT
PACK CARDIAC CATH (CUSTOM PROCEDURE TRAY) ×3 IMPLANT
SHEATH AVANTI 5FR X 11CM (SHEATH) ×6 IMPLANT
SHEATH AVANTI 7FRX11 (SHEATH) ×3 IMPLANT
SHEATH RAABE 5FRX55 (SHEATH) ×3 IMPLANT
WIRE EMERALD 3MM-J .035X260CM (WIRE) ×3 IMPLANT
WIRE EMERALD ST .035X150CM (WIRE) ×3 IMPLANT
WIRE GUIDERIGHT .035X150 (WIRE) ×3 IMPLANT
WIRE HITORQ VERSACORE ST 145CM (WIRE) ×3 IMPLANT

## 2020-04-03 NOTE — Interval H&P Note (Signed)
History and Physical Interval Note:  04/03/2020 7:10 PM  Steve Salazar  has presented today for surgery, with the diagnosis of RT and LT Cath chronic systolic CHF, dilated cardiomyopathy, preop ICD implant.  The various methods of treatment have been discussed with the patient and family. After consideration of risks, benefits and other options for treatment, the patient has consented to  Procedure(s): RIGHT/LEFT HEART CATH AND CORONARY ANGIOGRAPHY (Bilateral) as a surgical intervention.  The patient's history has been reviewed, patient examined, no change in status, stable for surgery.  I have reviewed the patient's chart and labs.  Questions were answered to the patient's satisfaction.     [name]

## 2020-04-03 NOTE — H&P (Signed)
H&P Addendum, precardiac catheterization  Patient was seen and evaluated prior to Cardiac catheterization procedure Symptoms, prior testing details again confirmed with the patient Patient examined, no significant change from prior exam Lab work reviewed in detail personally by myself Patient understands risk and benefit of the procedure, willing to proceed  Signed, Tim Nahzir Pohle, MD, Ph.D [facility]    

## 2020-04-04 ENCOUNTER — Encounter: Payer: Self-pay | Admitting: Cardiovascular Disease

## 2020-04-15 ENCOUNTER — Encounter: Payer: Self-pay | Admitting: Nurse Practitioner

## 2020-04-15 ENCOUNTER — Ambulatory Visit (INDEPENDENT_AMBULATORY_CARE_PROVIDER_SITE_OTHER): Payer: Medicare HMO | Admitting: Nurse Practitioner

## 2020-04-15 ENCOUNTER — Other Ambulatory Visit: Payer: Self-pay

## 2020-04-15 VITALS — BP 110/80 | HR 70 | Ht 71.0 in | Wt 204.0 lb

## 2020-04-15 DIAGNOSIS — I5022 Chronic systolic (congestive) heart failure: Secondary | ICD-10-CM

## 2020-04-15 DIAGNOSIS — N182 Chronic kidney disease, stage 2 (mild): Secondary | ICD-10-CM

## 2020-04-15 DIAGNOSIS — I10 Essential (primary) hypertension: Secondary | ICD-10-CM

## 2020-04-15 DIAGNOSIS — I428 Other cardiomyopathies: Secondary | ICD-10-CM | POA: Diagnosis not present

## 2020-04-15 NOTE — Progress Notes (Signed)
Office Visit    Patient Name: Steve Salazar Date of Encounter: 04/15/2020  Primary Care Provider:  [name], MD Primary Cardiologist:  [name], MD  Chief Complaint    69 year old male with a history of nonischemic cardiomyopathy, heart failure with reduced ejection fraction, stage II chronic kidney disease, hypertension, PSVT, and COVID-19 infection in December 2020, who presents for follow-up related to heart failure and recent catheterization.  Past Medical History    Past Medical History:  Diagnosis Date  . CKD (chronic kidney disease), stage II   . COVID-19 virus infection 03/2019  . HFrEF (heart failure with reduced ejection fraction) (HCC)    a. 2011 Echo: EF 25-30%; b. 10/2016 Echo: EF 20-25%; c. 11/2019 Echo: EF 20-25%, mild LVH, Gr2 DD, sev dil LA, mod dil RA, mod MR, mild to mod AI, mod PR, Asc Ao 45mm.  Marland Kitchen Hypertension   . NICM (nonischemic cardiomyopathy) (HCC)    a. 2011 Echo: EF 25-30%; b. 2011 MV: basal to dist inf perfusion defect->atten; c. 10/2016 Echo: EF 20-25%; d. 11/2019 Echo: EF 20-25%; e. 11/2019 MV: No ischemia. Fixed inflat defect, inflat HK, EF 17%; f. 03/2020 Cath: Nl cors. CO/CI 4.07/1.92.  Marland Kitchen PSVT (paroxysmal supraventricular tachycardia) (HCC)    a. 12/2016 Event monitor: rare, short episodes of SVT.   Past Surgical History:  Procedure Laterality Date  . RIGHT/LEFT HEART CATH AND CORONARY ANGIOGRAPHY Bilateral 04/01/2020   Procedure: RIGHT/LEFT HEART CATH AND CORONARY ANGIOGRAPHY;  Surgeon: [name], MD;  Location: [facility] INVASIVE CV LAB;  Service: Cardiovascular;  Laterality: Bilateral;    Allergies  No Known Allergies  History of Present Illness    69 year old male with above past medical history including nonischemic cardiomyopathy, HFrEF, PSVT, COVID-19 infection in December 2020, stage II chronic kidney disease, and hypertensive heart disease.  Cardiac history dates back to 2011 when he was admitted with dyspnea and was  found to have LV dysfunction with an EF of 25 to 30% by echocardiogram.  Stress testing showed decreased perfusion in the basal to distal inferior wall as well as inferior apical region concerning for attenuation artifact.  EF was 27%.  Subsequent echocardiogram in July 2018 continue to show LV dysfunction.  He underwent outpatient monitoring in 2018 which showed brief episodes of PSVT but no significant arrhythmias.  This has been managed with beta-blocker therapy.  Despite optimization of guideline directed medical therapy, echocardiogram in August 2021 showed persistent LV dysfunction with an EF of 20-25%, global hypokinesis, and grade 2 diastolic dysfunction.  Mild to moderate AI, moderate PR, and mildly dilated ascending aorta were also noted.  Medical therapy has been somewhat limited due to orthostasis.  He was evaluated by electrophysiology and felt to be a good candidate for CRT-D (currently scheduled for May 03, 2020) with recommendation for diagnostic catheterization.  This was performed December 3 and showed no significant coronary artery disease with high normal filling pressures, cardiac output of 4.07 and index of 1.92.  Since his catheterization, he has done well.  He notes a small knot @ the cath site, which is slowly resolving.  He denies chest pain, dyspnea, palpitations, PND, orthopnea, dizziness, syncope, edema, or early satiety.  Home Medications    Prior to Admission medications   Medication Sig Start Date End Date Taking? Authorizing Provider  acetaminophen (TYLENOL) 500 MG tablet Take 1,000 mg by mouth every 6 (six) hours as needed for mild pain or moderate pain.    [provider]  carvedilol (COREG) 25 MG tablet Take 1 tablet (25 mg total) by mouth 2 (two) times daily with a meal. Patient not taking: Reported on 03/23/2020 07/14/19   [name], MD  carvedilol (COREG) 6.25 MG tablet Take 6.25 mg by mouth 2 (two) times daily with a meal.    [provider]  furosemide (LASIX) 40 MG tablet Take 40 mg by mouth daily. 11/10/19   [provider]  sacubitril-valsartan (ENTRESTO) 24-26 MG Take 1 tablet by mouth 2 (two) times daily. Patient not taking: Reported on 03/23/2020 03/14/20   Sondra Barges, PA-C  sacubitril-valsartan (ENTRESTO) 49-51 MG Take 1 tablet by mouth 2 (two) times daily.    [provider]  sildenafil (REVATIO) 20 MG tablet TAKE 1 TABLET BY MOUTH THREE TIMES DAILY IF NEEDED Patient taking differently: Take 20 mg by mouth daily as needed (ed).  10/28/17   [name], MD  spironolactone (ALDACTONE) 25 MG tablet Take 12.5 mg by mouth daily. 02/10/20   [provider]  tamsulosin (FLOMAX) 0.4 MG CAPS capsule Take 0.4 mg by mouth daily.     [provider]    Review of Systems    Small right groin hematoma.  He denies chest pain, palpitations, dyspnea, pnd, orthopnea, n, v, dizziness, syncope, edema, weight gain, or early satiety.  All other systems reviewed and are otherwise negative except as noted above.  Physical Exam    VS:  BP 110/80 (BP Location: Left Arm, Patient Position: Sitting, Cuff Size: Normal)   Pulse 70   Ht 5\' 11"  (1.803 m)   Wt 204 lb (92.5 kg)   SpO2 97%   BMI 28.45 kg/m  , BMI Body mass index is 28.45 kg/m. GEN: Well nourished, well developed, in no acute distress. HEENT: normal. Neck: Supple, no JVD, carotid bruits, or masses. Cardiac: RRR, no murmurs, rubs, or gallops. No clubbing, cyanosis, edema.  Radials/PT 2+ and equal bilaterally.  Right femoral catheterization site without bleeding, bruit.  He does have a quarter sized hematoma over the right groin cath site.  It is mildly tender.  No bruit. Respiratory:  Respirations regular and unlabored, clear to auscultation bilaterally. GI: Soft, nontender, nondistended, BS + x 4. MS: no deformity or atrophy. Skin: warm and dry, no rash. Neuro:  Strength and sensation are intact. Psych: Normal affect.  Accessory  Clinical Findings    Lab Results  Component Value Date   WBC 4.7 03/14/2020   HGB 12.9 (L) 03/14/2020   HCT 40.2 03/14/2020   MCV 94 03/14/2020   PLT 162 03/14/2020   Lab Results  Component Value Date   CREATININE 1.14 03/14/2020   BUN 16 03/14/2020   NA 141 03/14/2020   K 4.5 03/14/2020   CL 100 03/14/2020   CO2 20 03/14/2020   Assessment & Plan    1.  Nonischemic cardiomyopathy/chronic heart failure with reduced ejection fraction: EF 20 to 25% by echo in August 2021.  Recent catheterization without any significant coronary artery disease.  He has been stable since his catheterization though notes a small right groin hematoma, which is resolving.  He is tolerating current doses of carvedilol, Entresto, spironolactone, and furosemide.  He did experience orthostasis on a higher dose of Entresto previously.  For this reason, along with a low normal blood pressure of 110 systolic, I will forego titrating any of his medications or adding empagliflozin at this time.  He is scheduled for CRT-D placement on January 14.  He will  have Covid testing and labs on the 12th.  2.  Hypertensive heart disease: Stable on current medications.  3.  Stage II chronic kidney disease: Creatinine 1.14 in November.  He will have follow-up labs prior to CRT-D.  4.  History of PSVT: Quiescent on beta-blocker therapy.  5.  Disposition: CRT-D placement January 14.  Follow-up in clinic in February.  Nicolasa Ducking, NP 04/15/2020, 5:19 PM

## 2020-04-15 NOTE — Patient Instructions (Signed)
Medication Instructions:  Your physician recommends that you continue on your current medications as directed. Please refer to the Current Medication list given to you today.  *If you need a refill on your cardiac medications before your next appointment, please call your pharmacy*   Lab Work: Your physician recommends that you return for lab work on 05/12/19 (bmet, cbc) same day as your scheduled COVID test.  Please have your labs drawn at the medical mall. Lab hours are M-F 8am-6pm. You do not need an appt.  If you have labs (blood work) drawn today and your tests are completely normal, you will receive your results only by:  MyChart Message (if you have MyChart) OR  A paper copy in the mail If you have any lab test that is abnormal or we need to change your treatment, we will call you to review the results.   Testing/Procedures: None ordered   Follow-Up: At [facility], you and your health needs are our priority.  As part of our continuing mission to provide you with exceptional heart care, we have created designated Provider Care Teams.  These Care Teams include your primary Cardiologist (physician) and Advanced Practice Providers (APPs -  Physician Assistants and Nurse Practitioners) who all work together to provide you with the care you need, when you need it.  We recommend signing up for the patient portal called "MyChart".  Sign up information is provided on this After Visit Summary.  MyChart is used to connect with patients for Virtual Visits (Telemedicine).  Patients are able to view lab/test results, encounter notes, upcoming appointments, etc.  Non-urgent messages can be sent to your provider as well.   To learn more about what you can do with MyChart, go to [email or web address].    Your next appointment:   Feb 2022   The format for your next appointment:   In Person  Provider:   You may see [name], MD or one of the following Advanced Practice Providers on  your designated Care Team:    Nicolasa Ducking, NP  Eula Listen, PA-C  Marisue Ivan, PA-C  Cadence Lopeno, [place]  Gillian Shields, NP    Other Instructions N/A

## 2020-05-09 ENCOUNTER — Telehealth: Payer: Self-pay | Admitting: Cardiology

## 2020-05-09 NOTE — Telephone Encounter (Signed)
Please call sister to discuss pre procedure instructions.

## 2020-05-09 NOTE — Telephone Encounter (Signed)
Returned call to sister (on [place])  Procedure instructions given.  All questions answered.

## 2020-05-11 ENCOUNTER — Telehealth: Payer: Self-pay

## 2020-05-11 ENCOUNTER — Other Ambulatory Visit
Admission: RE | Admit: 2020-05-11 | Discharge: 2020-05-11 | Disposition: A | Discharge status: Home / Self Care | Payer: Medicare HMO | Source: Ambulatory Visit | Attending: Cardiology | Admitting: Cardiology

## 2020-05-11 ENCOUNTER — Other Ambulatory Visit
Admission: RE | Admit: 2020-05-11 | Discharge: 2020-05-11 | Disposition: A | Discharge status: Home / Self Care | Payer: Medicare HMO | Source: Home / Self Care | Attending: Nurse Practitioner | Admitting: Nurse Practitioner

## 2020-05-11 ENCOUNTER — Other Ambulatory Visit: Payer: Self-pay

## 2020-05-11 DIAGNOSIS — Z01812 Encounter for preprocedural laboratory examination: Secondary | ICD-10-CM | POA: Insufficient documentation

## 2020-05-11 DIAGNOSIS — I5022 Chronic systolic (congestive) heart failure: Secondary | ICD-10-CM | POA: Insufficient documentation

## 2020-05-11 DIAGNOSIS — Z20822 Contact with and (suspected) exposure to covid-19: Secondary | ICD-10-CM | POA: Diagnosis not present

## 2020-05-11 LAB — BASIC METABOLIC PANEL
Anion gap: 10 (ref 5–15)
BUN: 19 mg/dL (ref 8–23)
CO2: 26 mmol/L (ref 22–32)
Calcium: 8.9 mg/dL (ref 8.9–10.3)
Chloride: 105 mmol/L (ref 98–111)
Creatinine, Ser: 1.11 mg/dL (ref 0.61–1.24)
GFR, Estimated: >60 mL/min (ref >60)
Glucose, Bld: 127 mg/dL — ABNORMAL HIGH (ref 70–99)
Potassium: 3.3 mmol/L — ABNORMAL LOW (ref 3.5–5.1)
Sodium: 141 mmol/L (ref 135–145)

## 2020-05-11 LAB — CBC WITH DIFFERENTIAL/PLATELET
Abs Immature Granulocytes: 0.02 10*3/uL (ref 0.00–0.07)
Basophils Absolute: 0 10*3/uL (ref 0.0–0.1)
Basophils Relative: 0 %
Eosinophils Absolute: 0 10*3/uL (ref 0.0–0.5)
Eosinophils Relative: 1 %
HCT: 38.7 % — ABNORMAL LOW (ref 39.0–52.0)
Hemoglobin: 12.3 g/dL — ABNORMAL LOW (ref 13.0–17.0)
Immature Granulocytes: 0 %
Lymphocytes Relative: 14 %
Lymphs Abs: 1.1 10*3/uL (ref 0.7–4.0)
MCH: 29 pg (ref 26.0–34.0)
MCHC: 31.8 g/dL (ref 30.0–36.0)
MCV: 91.3 fL (ref 80.0–100.0)
Monocytes Absolute: 0.6 10*3/uL (ref 0.1–1.0)
Monocytes Relative: 7 %
Neutro Abs: 6 10*3/uL (ref 1.7–7.7)
Neutrophils Relative %: 78 %
Platelets: 149 10*3/uL — ABNORMAL LOW (ref 150–400)
RBC: 4.24 MIL/uL (ref 4.22–5.81)
RDW: 13.6 % (ref 11.5–15.5)
WBC: 7.7 10*3/uL (ref 4.0–10.5)
nRBC: 0 % (ref 0.0–0.2)

## 2020-05-11 LAB — SARS CORONAVIRUS 2 (TAT 6-24 HRS): SARS Coronavirus 2: NEGATIVE

## 2020-05-11 MED ORDER — POTASSIUM CHLORIDE CRYS ER 20 MEQ PO TBCR: EXTENDED_RELEASE_TABLET | ORAL | 5 refills | Status: DC

## 2020-05-11 NOTE — Telephone Encounter (Signed)
Patient calling  States he is at [facility] waiting for new prescription Please send ASAP

## 2020-05-11 NOTE — Telephone Encounter (Signed)
Call placed to Pt and sister.  Advised [name] procedure on 05/13/20 needed to be moved to 7:30 am.  Will call Pt back tomorrow at lunch time to confirm he can get to hospital at 5:30 am.

## 2020-05-11 NOTE — Telephone Encounter (Signed)
1st prescription was signed in error and immediately addended, informed the pharmacist of this and she saw the detailed instructions updated.

## 2020-05-11 NOTE — Telephone Encounter (Signed)
Spoke with Steve Salazar per DPR on file and relayed the below instructions. Patient called shortly after and I also informed him of instructions and that RX was sent into his pharmacy.  Creig Hines, NP  Bryna Colander, RN Renal fxn and blood counts ok. Potassium is low at 3.3. Pls add kdur x 1 and then daily. He is coming for BiV ICD 1/14, perhaps he can have a repeat bmet at that time.

## 2020-05-11 NOTE — Telephone Encounter (Signed)
[facility] calling  They received no instructions for potassium medication  Please call [phone] and ask for [facility]

## 2020-05-12 NOTE — Telephone Encounter (Signed)
Confirmed with Pt that he will arrive for procedure tomorrow 1/14 at 5:30 am.

## 2020-05-13 ENCOUNTER — Other Ambulatory Visit: Payer: Self-pay

## 2020-05-13 ENCOUNTER — Ambulatory Visit (HOSPITAL_COMMUNITY)
Admission: RE | Disposition: A | Discharge status: Home / Self Care | Payer: Medicare HMO | Source: Home / Self Care | Attending: Cardiology

## 2020-05-13 ENCOUNTER — Ambulatory Visit (HOSPITAL_COMMUNITY)
Admission: RE | Admit: 2020-05-13 | Discharge: 2020-05-13 | Disposition: A | Discharge status: Home / Self Care | Payer: Medicare HMO | Attending: Cardiology | Admitting: Cardiology

## 2020-05-13 ENCOUNTER — Ambulatory Visit (HOSPITAL_COMMUNITY): Payer: Medicare HMO

## 2020-05-13 ENCOUNTER — Telehealth: Payer: Self-pay | Admitting: Emergency Medicine

## 2020-05-13 DIAGNOSIS — I110 Hypertensive heart disease with heart failure: Secondary | ICD-10-CM | POA: Diagnosis not present

## 2020-05-13 DIAGNOSIS — Z87891 Personal history of nicotine dependence: Secondary | ICD-10-CM | POA: Insufficient documentation

## 2020-05-13 DIAGNOSIS — I420 Dilated cardiomyopathy: Secondary | ICD-10-CM | POA: Insufficient documentation

## 2020-05-13 DIAGNOSIS — Z8616 Personal history of COVID-19: Secondary | ICD-10-CM | POA: Diagnosis not present

## 2020-05-13 DIAGNOSIS — Z79899 Other long term (current) drug therapy: Secondary | ICD-10-CM | POA: Insufficient documentation

## 2020-05-13 DIAGNOSIS — I428 Other cardiomyopathies: Secondary | ICD-10-CM

## 2020-05-13 DIAGNOSIS — I5042 Chronic combined systolic (congestive) and diastolic (congestive) heart failure: Secondary | ICD-10-CM | POA: Insufficient documentation

## 2020-05-13 DIAGNOSIS — I447 Left bundle-branch block, unspecified: Secondary | ICD-10-CM | POA: Diagnosis not present

## 2020-05-13 DIAGNOSIS — Z95810 Presence of automatic (implantable) cardiac defibrillator: Secondary | ICD-10-CM

## 2020-05-13 HISTORY — PX: BIV ICD INSERTION CRT-D: EP1195

## 2020-05-13 LAB — BASIC METABOLIC PANEL
Anion gap: 10 (ref 5–15)
BUN: 17 mg/dL (ref 8–23)
CO2: 25 mmol/L (ref 22–32)
Calcium: 8.8 mg/dL — ABNORMAL LOW (ref 8.9–10.3)
Chloride: 104 mmol/L (ref 98–111)
Creatinine, Ser: 1.19 mg/dL (ref 0.61–1.24)
GFR, Estimated: >60 mL/min (ref >60)
Glucose, Bld: 107 mg/dL — ABNORMAL HIGH (ref 70–99)
Potassium: 3.8 mmol/L (ref 3.5–5.1)
Sodium: 139 mmol/L (ref 135–145)

## 2020-05-13 SURGERY — BIV ICD INSERTION CRT-D

## 2020-05-13 MED ORDER — HEPARIN (PORCINE) IN NACL 1000-0.9 UT/500ML-% IV SOLN
INTRAVENOUS | Status: AC
  Filled 2020-05-13: qty 500

## 2020-05-13 MED ORDER — SODIUM CHLORIDE 0.9 % IV SOLN
INTRAVENOUS | Status: AC
  Filled 2020-05-13: qty 2

## 2020-05-13 MED ORDER — CEFAZOLIN SODIUM-DEXTROSE 2-4 GM/100ML-% IV SOLN
INTRAVENOUS | Status: AC
  Filled 2020-05-13: qty 100

## 2020-05-13 MED ORDER — SODIUM CHLORIDE 0.9 % IV SOLN
80.0000 mg | INTRAVENOUS | Status: AC
  Administered 2020-05-13: 80 mg
  Filled 2020-05-13: qty 2

## 2020-05-13 MED ORDER — ONDANSETRON HCL 4 MG/2ML IJ SOLN: 4.0000 mg | Freq: Four times a day (QID) | INTRAMUSCULAR | Status: DC | PRN

## 2020-05-13 MED ORDER — CEFAZOLIN SODIUM-DEXTROSE 2-4 GM/100ML-% IV SOLN
2.0000 g | INTRAVENOUS | Status: AC
  Administered 2020-05-13: 2 g via INTRAVENOUS
  Filled 2020-05-13: qty 100

## 2020-05-13 MED ORDER — HEPARIN (PORCINE) IN NACL 1000-0.9 UT/500ML-% IV SOLN
INTRAVENOUS | Status: DC | PRN
  Administered 2020-05-13: 500 mL

## 2020-05-13 MED ORDER — MIDAZOLAM HCL 5 MG/5ML IJ SOLN
INTRAMUSCULAR | Status: DC | PRN
  Administered 2020-05-13 (×2): 1 mg via INTRAVENOUS

## 2020-05-13 MED ORDER — MIDAZOLAM HCL 5 MG/5ML IJ SOLN
INTRAMUSCULAR | Status: AC
  Filled 2020-05-13: qty 5

## 2020-05-13 MED ORDER — IOHEXOL 350 MG/ML SOLN
INTRAVENOUS | Status: DC | PRN
  Administered 2020-05-13: 50 mL
  Administered 2020-05-13: 15 mL

## 2020-05-13 MED ORDER — SODIUM CHLORIDE 0.9 % IV SOLN: INTRAVENOUS | Status: DC

## 2020-05-13 MED ORDER — CEFAZOLIN SODIUM-DEXTROSE 2-3 GM-%(50ML) IV SOLR
INTRAVENOUS | Status: DC | PRN
  Administered 2020-05-13: 2 g via INTRAVENOUS

## 2020-05-13 MED ORDER — ACETAMINOPHEN 325 MG PO TABS
325.0000 mg | ORAL_TABLET | ORAL | Status: DC | PRN
  Administered 2020-05-13: 650 mg via ORAL
  Filled 2020-05-13 (×3): qty 2

## 2020-05-13 MED ORDER — LIDOCAINE HCL (PF) 1 % IJ SOLN
INTRAMUSCULAR | Status: DC | PRN
  Administered 2020-05-13: 10 mL

## 2020-05-13 MED ORDER — FENTANYL CITRATE (PF) 100 MCG/2ML IJ SOLN
INTRAMUSCULAR | Status: AC
  Filled 2020-05-13: qty 2

## 2020-05-13 MED ORDER — LIDOCAINE HCL 1 % IJ SOLN
INTRAMUSCULAR | Status: AC
  Filled 2020-05-13: qty 60

## 2020-05-13 MED ORDER — CHLORHEXIDINE GLUCONATE 4 % EX LIQD
4.0000 "application " | Freq: Once | CUTANEOUS | Status: DC
  Filled 2020-05-13: qty 60

## 2020-05-13 MED ORDER — FENTANYL CITRATE (PF) 100 MCG/2ML IJ SOLN
INTRAMUSCULAR | Status: DC | PRN
  Administered 2020-05-13 (×2): 25 ug via INTRAVENOUS

## 2020-05-13 SURGICAL SUPPLY — 19 items
BALLN COR SINUS VENO 6FR 80 (BALLOONS) ×2
BALLOON COR SINUS VENO 6FR 80 (BALLOONS) ×1 IMPLANT
CABLE SURGICAL S-101-97-12 (CABLE) ×2 IMPLANT
CATH CPS DIRECT 135 DS2C020 (CATHETERS) ×2 IMPLANT
CPS IMPLANT KIT 410190 (MISCELLANEOUS) ×2 IMPLANT
ICD GALLANT HFCRTD CDHFA500Q (ICD Generator) ×2 IMPLANT
LEAD DURATA 7122Q-65CM (Lead) ×2 IMPLANT
LEAD QUARTET 1458QL-86 (Lead) ×1 IMPLANT
LEAD TENDRIL MRI 52CM LPA1200M (Lead) ×2 IMPLANT
PAD PRO RADIOLUCENT 2001M-C (PAD) ×2 IMPLANT
QUARTET 1458QL-86 (Lead) ×2 IMPLANT
SHEATH 7FR PRELUDE SNAP 13 (SHEATH) ×2 IMPLANT
SHEATH 8FR PRELUDE SNAP 13 (SHEATH) ×2 IMPLANT
SHEATH 9.5FR PRELUDE SNAP 13 (SHEATH) ×2 IMPLANT
SHEATH PROBE COVER 6X72 (BAG) ×2 IMPLANT
SLITTER UNIVERSAL DS2A003 (MISCELLANEOUS) ×2 IMPLANT
SYR CONTROL 10ML ANGIOGRAPHIC (SYRINGE) ×2 IMPLANT
TRAY PACEMAKER INSERTION (PACKS) ×2 IMPLANT
WIRE ACUITY WHISPER EDS 4648 (WIRE) ×2 IMPLANT

## 2020-05-13 NOTE — Discharge Instructions (Signed)
    Supplemental Discharge Instructions for  Pacemaker/Defibrillator Patients  Tomorrow, 05/14/20, send in a device transmission  Activity No heavy lifting or vigorous activity with your left/right arm for 6 to 8 weeks.  Do not raise your left/right arm above your head for one week.  Gradually raise your affected arm as drawn below.             05/17/20                    05/18/20                     05/19/20                  05/20/20 __  NO DRIVING for   1 week  ; you may begin driving on   05/19/39  .  WOUND CARE - Keep the wound area clean and dry.  Do not get this area wet , no showers for one week; you may shower on  05/20/20   . - Tomorrow, 05/14/20, remove the arm sling - Tomorrow, 05/14/20 remove the outer plastic bandage.  Underneath the plastic bandage there are steri strips (paper tapes), DO NOT remove these. - The tape/steri-strips on your wound will fall off; do not pull them off.  No bandage is needed on the site.  DO  NOT apply any creams, oils, or ointments to the wound area. - If you notice any drainage or discharge from the wound, any swelling or bruising at the site, or you develop a fever > 101? F after you are discharged home, call the office at once.  Special Instructions - You are still able to use cellular telephones; use the ear opposite the side where you have your pacemaker/defibrillator.  Avoid carrying your cellular phone near your device. - When traveling through airports, show security personnel your identification card to avoid being screened in the metal detectors.  Ask the security personnel to use the hand wand. - Avoid arc welding equipment, MRI testing (magnetic resonance imaging), TENS units (transcutaneous nerve stimulators).  Call the office for questions about other devices. - Avoid electrical appliances that are in poor condition or are not properly grounded. - Microwave ovens are safe to be near or to operate.  Additional information for defibrillator  patients should your device go off: - If your device goes off ONCE and you feel fine afterward, notify the device clinic nurses. - If your device goes off ONCE and you do not feel well afterward, call 911. - If your device goes off TWICE, call 911. - If your device goes off THREE times in one day, call 911.  DO NOT DRIVE YOURSELF OR A FAMILY MEMBER WITH A DEFIBRILLATOR TO THE HOSPITAL--CALL 911.

## 2020-05-13 NOTE — Progress Notes (Signed)
Pts CXR has been read and called to Dr Lalla Brothers. Device has been interrogated. EKG complete.Pt has been seen by Renee,PA.  Pt ambulated without difficulty or bleeding. Discharged home with girlfriend who will drive him home and stay with him x 24hrs

## 2020-05-13 NOTE — Telephone Encounter (Signed)
Patient contacted . Enroute home after discharge. Discharge instructions reviewed to include lifting and arm restrictions, wound care, and home monitor.

## 2020-05-13 NOTE — H&P (Signed)
Electrophysiology Office Note:    Date:  03/02/2020   ID:  Steve Salazar, DOB Apr 27, 1951, MRN [id number]  PCP:  [name], MD           [facility] Cardiologist:  [name], MD  [facility] Electrophysiologist:  [name], MD   Referring MD: Sondra Barges, PA-C   Chief Complaint: Nonischemic cardiomyopathy  History of Present Illness:    Steve Salazar is a 70 y.o. male who presents for an evaluation of nonischemic cardiomyopathy at the request of Eula Listen, PA-C. Their medical history includes chronic systolic and diastolic heart failure secondary to presumed nonischemic cardiomyopathy, paroxysmal SVT, COVID-19 infection in December 2020, hypertensive heart disease.  He last all Eula Listen on February 15, 2020.  His history of cardiomyopathy dates back to 2011 when he was admitted with dyspnea.  Echocardiogram at that time showed an ejection fraction of 25 to 30 percent.  A nuclear stress test performed in 2011 showed decreased perfusion in the basal to distal inferior wall which was thought to be secondary to an attenuation artifact.  Repeat echo was done in 2018 which showed a persistently decreased left ventricular function of 20 percent.  There was a regional wall motion abnormality in the inferior wall.  He was then seen in December 2020 at [facility] and was diagnosed with COVID-19.  He was discharged.  Follow-up echocardiogram in August 2021 again showed a decreased ejection fraction of 20 to 25 percent.  He has been maintained on good medical therapy including Entresto, carvedilol, Lasix, spironolactone.  His weights are stable.  No syncope or presyncope.  He tells me that he notices dyspnea with exertion that is significantly worse than several years ago.      Past Medical History:  Diagnosis Date  . Arrhythmia    SVT verus A-fib  . CHF (congestive heart failure) (HCC)   . Hypertension   . Hypertensive cardiomyopathy (HCC)    EF  35%  . Renal insufficiency    Mild chronic    History reviewed. No pertinent surgical history.  Current Medications: Active Medications      Current Meds  Medication Sig  . carvedilol (COREG) 25 MG tablet Take 1 tablet (25 mg total) by mouth 2 (two) times daily with a meal.  . furosemide (LASIX) 40 MG tablet Take 40 mg by mouth daily.  . naproxen sodium (ANAPROX) 220 MG tablet Take 220 mg by mouth as needed.  . sacubitril-valsartan (ENTRESTO) 49-51 MG Take 1 tablet by mouth 2 (two) times daily.  . sildenafil (REVATIO) 20 MG tablet TAKE 1 TABLET BY MOUTH THREE TIMES DAILY IF NEEDED  . spironolactone (ALDACTONE) 25 MG tablet Take 12.5 mg by mouth daily.  . tamsulosin (FLOMAX) 0.4 MG CAPS capsule Take 0.4 mg by mouth daily.        Allergies:   Patient has no known allergies.   Social History        Socioeconomic History  . Marital status: Single    Spouse name: Not on file  . Number of children: Not on file  . Years of education: Not on file  . Highest education level: Not on file  Occupational History  . Occupation: Full time  Tobacco Use  . Smoking status: Former Smoker    Types: Cigarettes  . Smokeless tobacco: Never Used  . Tobacco comment: Tobaco use-no  Vaping Use  . Vaping Use: Never used  Substance and Sexual Activity  . Alcohol  use: Yes    Alcohol/week: 2.0 standard drinks    Types: 2 Cans of beer per week    Comment: ocassional  . Drug use: No  . Sexual activity: Not on file  Other Topics Concern  . Not on file  Social History Narrative   Pt gets regular exercise.   Social Determinants of Health      Financial Resource Strain:   . Difficulty of Paying Living Expenses: Not on file  Food Insecurity:   . Worried About Programme researcher, broadcasting/film/video in the Last Year: Not on file  . Ran Out of Food in the Last Year: Not on file  Transportation Needs:   . Lack of Transportation (Medical): Not on file  . Lack of Transportation (Non-Medical):  Not on file  Physical Activity:   . Days of Exercise per Week: Not on file  . Minutes of Exercise per Session: Not on file  Stress:   . Feeling of Stress : Not on file  Social Connections:   . Frequency of Communication with Friends and Family: Not on file  . Frequency of Social Gatherings with Friends and Family: Not on file  . Attends Religious Services: Not on file  . Active Member of Clubs or Organizations: Not on file  . Attends Banker Meetings: Not on file  . Marital Status: Not on file     Family History: The patient's family history includes Heart attack in his father.  ROS:   Please see the history of present illness.    All other systems reviewed and are negative.  EKGs/Labs/Other Studies Reviewed:    The following studies were reviewed today: Prior records, echo   EKG:  The ekg ordered today demonstrates sinus rhythm with a left bundle branch block pattern in lead V1.   December 22, 2019 echo personally reviewed Severe left ventricular dysfunction, 20 to 25 percent.  Global hypokinesis.  Dilated left ventricle. Right ventricular function mildly reduced Biatrial enlargement Moderate mitral regurgitation Mild to moderate aortic regurgitation Moderate pulmonic valve regurgitation  Recent Labs: 06/01/2019: Magnesium 2.0; TSH 3.100 02/15/2020: BUN 17; Creatinine, Ser 1.25; Potassium 4.2; Sodium 141  Recent Lipid Panel Labs (Brief)          Component Value Date/Time   CHOL 208 (H) 01/26/2013 0826   TRIG 80 01/26/2013 0826   HDL 63 01/26/2013 0826   CHOLHDL 3.3 01/26/2013 0826   LDLCALC 129 (H) 01/26/2013 0826      Physical Exam:    VS:  BP 106/72   Pulse 65   Ht 5\' 11"  (1.803 m)   Wt 205 lb 3.2 oz (93.1 kg)   SpO2 97%   BMI 28.62 kg/m        Wt Readings from Last 3 Encounters:  03/02/20 205 lb 3.2 oz (93.1 kg)  02/15/20 204 lb (92.5 kg)  01/12/20 201 lb 6 oz (91.3 kg)     GEN: Well nourished, well developed in  no acute distress HEENT: Normal NECK: No JVD; No carotid bruits LYMPHATICS: No lymphadenopathy CARDIAC: RRR, no murmurs, rubs, gallops RESPIRATORY:  Clear to auscultation without rales, wheezing or rhonchi  ABDOMEN: Soft, non-tender, non-distended MUSCULOSKELETAL:  No edema; No deformity  SKIN: Warm and dry NEUROLOGIC:  Alert and oriented x 3 PSYCHIATRIC:  Normal affect   ASSESSMENT:    1. Dilated cardiomyopathy (HCC)   2. LBBB (left bundle branch block)    PLAN:    In order of problems listed above:  1. Chronic  combined systolic and diastolic heart failure From the echo, appears to be secondary to a nonischemic cardiomyopathy.  He has not had a formal coronary evaluation.  I would like to have this done prior to the implantation of the CRT-D.  He currently displays NYHA class II-III symptoms.  We discussed defibrillator therapy at length with the patient and his wife who is with him today.  I also discussed cardiac resynchronization therapy with the patient and his wife.  He clearly has an indication for an ICD and given his left ventricular dysfunction and left bundle branch block pattern in lead V1, I think there is a reasonable possibility that a CRT device could improve his symptoms.  I discussed the procedure at length with the patient and his wife including the risks and benefits.  I specifically discussed the risks that we would not be able to cannulate a suitable branch off of the coronary sinus.  If this were to occur would still implant a defibrillator.  The patient has chronic systolic heart failure, NYHA Class II-III CHF.  He is referred by Dr Mariah Milling for risk stratification of sudden death and consideration of CRT-D implantation.  At this time, he meets criteria for ICD implantation for primary prevention of sudden death.  I have had a thorough discussion with the patient reviewing options.  The patient and their family (if available) have had opportunities to ask questions  and have them answered. The patient and I have decided together through a shared decision making process to proceed with CRT D implant.   Risks, benefits, alternatives to CRT-D implantation were discussed in detail with the patient today. The patient understands that the risks include but are not limited to bleeding, infection, pneumothorax, perforation, tamponade, vascular damage, renal failure, MI, stroke, death, inappropriate shocks, and lead dislodgement and wishes to proceed.  We will therefore schedule device implantation at the next available time.     I have seen, examined the patient, and reviewed the above assessment and plan.    Plan for CRT-D implant.   [name], MD 05/13/2020 7:09 AM

## 2020-05-16 ENCOUNTER — Encounter (HOSPITAL_COMMUNITY): Payer: Self-pay | Admitting: Cardiology

## 2020-05-17 MED FILL — Lidocaine HCl Local Inj 1%: INTRAMUSCULAR | Qty: 40 | Status: AC

## 2020-05-17 MED FILL — Lidocaine HCl Local Inj 1%: INTRAMUSCULAR | Qty: 10 | Status: AC

## 2020-05-24 ENCOUNTER — Ambulatory Visit (INDEPENDENT_AMBULATORY_CARE_PROVIDER_SITE_OTHER): Payer: Medicare HMO | Admitting: Emergency Medicine

## 2020-05-24 ENCOUNTER — Other Ambulatory Visit: Payer: Self-pay

## 2020-05-24 DIAGNOSIS — I428 Other cardiomyopathies: Secondary | ICD-10-CM | POA: Diagnosis not present

## 2020-05-24 LAB — CUP PACEART INCLINIC DEVICE CHECK
Battery Remaining Longevity: 66 mo
Brady Statistic RA Percent Paced: 2.2 %
Brady Statistic RV Percent Paced: 94 %
Date Time Interrogation Session: 20220125123433
HighPow Impedance: 57.375
Implantable Lead Implant Date: 20220114
Implantable Lead Implant Date: 20220114
Implantable Lead Implant Date: 20220114
Implantable Lead Location: 753858
Implantable Lead Location: 753859
Implantable Lead Location: 753860
Implantable Pulse Generator Implant Date: 20220114
Lead Channel Impedance Value: 1250 Ohm
Lead Channel Impedance Value: 500 Ohm
Lead Channel Impedance Value: 500 Ohm
Lead Channel Pacing Threshold Amplitude: 0.5 V
Lead Channel Pacing Threshold Amplitude: 0.5 V
Lead Channel Pacing Threshold Amplitude: 1 V
Lead Channel Pacing Threshold Amplitude: 1 V
Lead Channel Pacing Threshold Amplitude: 1.25 V
Lead Channel Pacing Threshold Amplitude: 1.25 V
Lead Channel Pacing Threshold Pulse Width: 0.5 ms
Lead Channel Pacing Threshold Pulse Width: 0.5 ms
Lead Channel Pacing Threshold Pulse Width: 0.5 ms
Lead Channel Pacing Threshold Pulse Width: 0.5 ms
Lead Channel Pacing Threshold Pulse Width: 0.5 ms
Lead Channel Pacing Threshold Pulse Width: 0.5 ms
Lead Channel Sensing Intrinsic Amplitude: 11.5 mV
Lead Channel Sensing Intrinsic Amplitude: 4.3 mV
Lead Channel Setting Pacing Amplitude: 3.5 V
Lead Channel Setting Pacing Amplitude: 3.5 V
Lead Channel Setting Pacing Amplitude: 3.5 V
Lead Channel Setting Pacing Pulse Width: 0.5 ms
Lead Channel Setting Pacing Pulse Width: 0.5 ms
Lead Channel Setting Sensing Sensitivity: 0.5 mV
Pulse Gen Serial Number: 111033334

## 2020-05-24 NOTE — Progress Notes (Signed)
Wound check appointment. Steri-strips removed. Wound without redness or edema. Incision edges approximated, wound well healed. Normal device function. Thresholds, sensing, and impedances consistent with implant measurements. Device programmed at 3.5V for extra safety margin until 3 month visit. Histogram distribution appropriate for patient and level of activity. No mode switches or ventricular arrhythmias noted. 1 PMT episode occurred, appears false.  Patient educated about wound care, arm mobility, lifting restrictions, shock plan. Patient is enrolled in remote monitoring, next scheduled check 08/12/20.  ROV with Dr. Lalla Brothers 08/23/20.

## 2020-05-31 ENCOUNTER — Other Ambulatory Visit: Payer: Self-pay

## 2020-05-31 MED ORDER — SPIRONOLACTONE 25 MG PO TABS: 12.5000 mg | ORAL_TABLET | Freq: Every day | ORAL | 0 refills | Status: DC

## 2020-05-31 MED ORDER — FUROSEMIDE 40 MG PO TABS: 40.0000 mg | ORAL_TABLET | Freq: Every day | ORAL | 0 refills | Status: DC

## 2020-06-01 ENCOUNTER — Telehealth: Payer: Self-pay

## 2020-06-01 MED ORDER — POTASSIUM CHLORIDE CRYS ER 20 MEQ PO TBCR: 20.0000 meq | EXTENDED_RELEASE_TABLET | Freq: Every day | ORAL | 3 refills | Status: DC

## 2020-06-01 NOTE — Telephone Encounter (Signed)
Refill sent for Potassium Chloride 20 meq

## 2020-06-14 ENCOUNTER — Telehealth: Payer: Self-pay

## 2020-06-14 NOTE — Telephone Encounter (Signed)
-----   Message from Linton Ham, RN sent at 05/24/2020 12:34 PM EST ----- CRT-D implanted 05/13/20.  Patient was in for his wound check appointment today.  He is healing well.  He asked when he can return to work.  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  Please contact him to advise.

## 2020-06-15 NOTE — Telephone Encounter (Signed)
Returned call to Pt.  Advised Pt ok to return to work after 6 weeks s/p implant.  Letter created.  Pt will pick up when he comes for upcoming appt.

## 2020-06-19 NOTE — Progress Notes (Signed)
Cardiology Office Note  Date:  06/20/2020   ID:  Steve Salazar, DOB 03-Aug-1950, MRN [id number]  PCP:  [name], MD   Chief Complaint  Patient presents with  . Follow-up    2 month F/U-s/p ICD insertion.     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 Lalla Brothers May 13, 2020 Was ready to go back to work When he does work, works in a hot environment Has been out of work following ICD placement, Reports no significant discomfort left pectoral area," healed well"  orthostasis sx, rare episodes q 3 weeks He does have prior history of periodic dizziness Is now unable to hydrate, and gets bloated, short of breath On current medications feels well  compliant with his Lasix 40 daily, carvedilol 25 twice daily, entresto 24/26 mg twice daily spironolactone 12.5 daily  ICD download reviewed with him in detail from January 2022  echocardiogram ejection fraction 20%, unchanged from prior study  EKG personally reviewed by myself on todays visit Shows normal sinus rhythm with rate 75 bpm, intraventricular conduction delay, APCs, left anterior fascicular block  Other past medical history April 2013 was in the emergency room last week for shortness of breath, abdominal bloating He was given Lasix IV with improvement of his symptoms Lab work reviewed with him, Potassium in the hospital for 4.0, BNP 1200,  chest x-ray mild  interstitial edema  Previous Stress test showed severe cardiomyopathy with ejection fraction 25%, dilated left ventricle, low risk scan. Initial BNP on arrival to the hospital was 1342 He denied any history of drug or alcohol use, no cocaine. Previous lab work showing Total cholesterol 167, LDL 74, HDL 77.   Echo showing moderately dilated left ventricle, ejection fraction 25 to 30%, diastolic relaxation abnormality, normal RV size and function, mild to moderate mitral regurgitation, mild to moderately dilated left atrium  PMH:   has a past medical history of CKD (chronic kidney disease), stage II, COVID-19 virus infection (03/2019), HFrEF (heart failure with reduced ejection fraction) (HCC), Hypertension, NICM (nonischemic cardiomyopathy) (HCC), and PSVT (paroxysmal supraventricular tachycardia) (HCC).  PSH:    Past Surgical History:  Procedure Laterality Date  . BIV ICD INSERTION CRT-D N/A 05/13/2020   Procedure: BIV ICD INSERTION CRT-D;  Surgeon: [name], MD;  Location: [facility] INVASIVE CV LAB;  Service: Cardiovascular;  Laterality: N/A;  . RIGHT/LEFT HEART CATH AND CORONARY ANGIOGRAPHY Bilateral 04/01/2020   Procedure: RIGHT/LEFT HEART CATH AND CORONARY ANGIOGRAPHY;  Surgeon: [name], MD;  Location: [facility] INVASIVE CV LAB;  Service: Cardiovascular;  Laterality: Bilateral;    Current Outpatient Medications  Medication Sig Dispense Refill  . acetaminophen (TYLENOL) 500 MG tablet Take 1,000 mg by mouth every 6 (six) hours as needed for mild pain or moderate pain.    . carvedilol (COREG) 25 MG tablet Take 1 tablet (25 mg total) by mouth 2 (two) times daily with a meal. 180 tablet 3  . furosemide (LASIX) 40 MG tablet Take 1 tablet (40 mg total) by mouth daily.  90 tablet 0  . potassium chloride SA (KLOR-CON) 20 MEQ tablet Take 1 tablet (20 mEq total) by mouth daily. 90 tablet 3  . sacubitril-valsartan (ENTRESTO) 24-26 MG Take 1 tablet by mouth 2 (two) times daily. 60 tablet 1  .  sildenafil (REVATIO) 20 MG tablet TAKE 1 TABLET BY MOUTH THREE TIMES DAILY IF NEEDED 270 tablet 0  . spironolactone (ALDACTONE) 25 MG tablet Take 0.5 tablets (12.5 mg total) by mouth daily. 45 tablet 0  . tamsulosin (FLOMAX) 0.4 MG CAPS capsule Take 0.4 mg by mouth daily.      No current facility-administered medications for this visit.     Allergies:   Patient has no known allergies.   Social History:  The patient  reports that he has quit smoking. His smoking use included cigarettes. He has never used smokeless tobacco. He reports current alcohol use of about 2.0 standard drinks of alcohol per week. He reports that he does not use drugs.   Family History:   family history includes Heart attack in his father.    Review of Systems: Review of Systems  Constitutional: Negative.   HENT: Negative.   Respiratory: Negative.   Cardiovascular: Negative.   Gastrointestinal: Negative.   Musculoskeletal: Negative.   Neurological: Positive for dizziness.  Psychiatric/Behavioral: Negative.   All other systems reviewed and are negative.   PHYSICAL EXAM: VS:  BP 101/71 (BP Location: Left Arm, Patient Position: Sitting, Cuff Size: Large)   Pulse 75   Ht 5\' 11"  (1.803 m)   Wt 206 lb (93.4 kg)   SpO2 97%   BMI 28.73 kg/m  , BMI Body mass index is 28.73 kg/m. Constitutional:  oriented to person, place, and time. No distress.  HENT:  Head: Grossly normal Eyes:  no discharge. No scleral icterus.  Neck: No JVD, no carotid bruits  Cardiovascular: Regular rate and rhythm, no murmurs appreciated Pulmonary/Chest: Clear to auscultation bilaterally, no wheezes or rails Abdominal: Soft.  no distension.  no tenderness.  Musculoskeletal: Normal range of motion Neurological:  normal muscle tone. Coordination normal. No atrophy Skin: Skin warm and dry Psychiatric: normal affect, pleasant  Recent Labs: 05/11/2020: Hemoglobin 12.3; Platelets 149 05/13/2020: BUN 17; Creatinine, Ser 1.19; Potassium 3.8;  Sodium 139    Lipid Panel Lab Results  Component Value Date   CHOL 208 (H) 01/26/2013   HDL 63 01/26/2013   LDLCALC 129 (H) 01/26/2013   TRIG 80 01/26/2013      Wt Readings from Last 3 Encounters:  06/20/20 206 lb (93.4 kg)  05/13/20 204 lb (92.5 kg)  04/15/20 204 lb (92.5 kg)       ASSESSMENT AND PLAN:  Acute on chronic systolic CHF (congestive heart failure) (HCC) -  Blood pressure stable on current medication regimen Appears euvolemic, lab work reviewed, stable Active at baseline  Dilated cardiomyopathy Cardiac catheterization no significant disease Right heart pressures were normal Recent ICD placed, details discussed with him  Essential hypertension -  Rare orthostasis symptoms, no changes made to his medications, he will call 04/17/20 if symptoms get worse  SVT/ PSVT/ PAT - Plan: EKG 12-Lead Denies any tachycardia ICD download will help to monitor    Total encounter time more than 25 minutes  Greater than 50% was spent in counseling and coordination of care with the patient   No orders of the defined types were placed in this encounter.    Signed, [place], M.D., Ph.D. 06/20/2020  [facility] [place], [place]

## 2020-06-20 ENCOUNTER — Encounter: Payer: Self-pay | Admitting: Cardiovascular Disease

## 2020-06-20 ENCOUNTER — Other Ambulatory Visit: Payer: Self-pay

## 2020-06-20 ENCOUNTER — Ambulatory Visit (INDEPENDENT_AMBULATORY_CARE_PROVIDER_SITE_OTHER): Payer: Medicare HMO | Admitting: Cardiovascular Disease

## 2020-06-20 VITALS — BP 101/71 | HR 75 | Ht 71.0 in | Wt 206.0 lb

## 2020-06-20 DIAGNOSIS — I5020 Unspecified systolic (congestive) heart failure: Secondary | ICD-10-CM

## 2020-06-20 DIAGNOSIS — I10 Essential (primary) hypertension: Secondary | ICD-10-CM

## 2020-06-20 DIAGNOSIS — I420 Dilated cardiomyopathy: Secondary | ICD-10-CM

## 2020-06-20 DIAGNOSIS — I428 Other cardiomyopathies: Secondary | ICD-10-CM

## 2020-06-20 DIAGNOSIS — N182 Chronic kidney disease, stage 2 (mild): Secondary | ICD-10-CM

## 2020-06-20 DIAGNOSIS — I5022 Chronic systolic (congestive) heart failure: Secondary | ICD-10-CM

## 2020-06-20 NOTE — Patient Instructions (Addendum)
Medication Instructions:  No changes  If you need a refill on your cardiac medications before your next appointment, please call your pharmacy.    Lab work: No new labs needed   If you have labs (blood work) drawn today and your tests are completely normal, you will receive your results only by: Marland Kitchen MyChart Message (if you have MyChart) OR . A paper copy in the mail If you have any lab test that is abnormal or we need to change your treatment, we will call you to review the results.   Testing/Procedures: No new testing needed   Follow-Up: At [facility], you and your health needs are our priority.  As part of our continuing mission to provide you with exceptional heart care, we have created designated Provider Care Teams.  These Care Teams include your primary Cardiologist (physician) and Advanced Practice Providers (APPs -  Physician Assistants and Nurse Practitioners) who all work together to provide you with the care you need, when you need it.  . You will need a follow up appointment in Nov 2022  . Providers on your designated Care Team:   . Nicolasa Ducking, NP . Eula Listen, PA-C . Marisue Ivan, PA-C  Any Other Special Instructions Will Be Listed Below (If Applicable).  COVID-19 Vaccine Information can be found at: [email or web address] For questions related to vaccine distribution or appointments, please email [email or web address] or call [phone].

## 2020-07-07 ENCOUNTER — Other Ambulatory Visit: Payer: Self-pay | Admitting: Physician Assistant

## 2020-08-23 ENCOUNTER — Ambulatory Visit: Payer: Medicare HMO | Admitting: Cardiology

## 2020-08-23 ENCOUNTER — Ambulatory Visit (INDEPENDENT_AMBULATORY_CARE_PROVIDER_SITE_OTHER): Payer: Medicare HMO

## 2020-08-23 ENCOUNTER — Other Ambulatory Visit: Payer: Self-pay

## 2020-08-23 ENCOUNTER — Encounter: Payer: Self-pay | Admitting: Cardiology

## 2020-08-23 VITALS — BP 116/84 | HR 73 | Ht 71.0 in | Wt 204.8 lb

## 2020-08-23 DIAGNOSIS — I428 Other cardiomyopathies: Secondary | ICD-10-CM | POA: Diagnosis not present

## 2020-08-23 DIAGNOSIS — I447 Left bundle-branch block, unspecified: Secondary | ICD-10-CM | POA: Diagnosis not present

## 2020-08-23 DIAGNOSIS — Z95810 Presence of automatic (implantable) cardiac defibrillator: Secondary | ICD-10-CM | POA: Diagnosis not present

## 2020-08-23 DIAGNOSIS — I471 Supraventricular tachycardia: Secondary | ICD-10-CM

## 2020-08-23 MED ORDER — AMIODARONE HCL 200 MG PO TABS: ORAL_TABLET | ORAL | 3 refills | Status: DC

## 2020-08-23 NOTE — Patient Instructions (Addendum)
Medication Instructions:  Your physician has recommended you make the following change in your medication:   1.  START taking amiodarone 200 mg-   A.  TAKE amiodarone 200 mg-  Take 2 tablets by mouth TWICE a day for 7 days THEN--  B.  REDUCE your dose to amiodarone 200 mg-  ONE tablet by mouth ONCE a day  *If you need a refill on your cardiac medications before your next appointment, please call your pharmacy*  Lab Work: You will get lab work today:  CMP, TSH and free T4  If you have labs (blood work) drawn today and your tests are completely normal, you will receive your results only by: Marland Kitchen MyChart Message (if you have MyChart) OR . A paper copy in the mail If you have any lab test that is abnormal or we need to change your treatment, we will call you to review the results.  Testing/Procedures: None ordered.  Follow-Up: At [facility], you and your health needs are our priority.  As part of our continuing mission to provide you with exceptional heart care, we have created designated Provider Care Teams.  These Care Teams include your primary Cardiologist (physician) and Advanced Practice Providers (APPs -  Physician Assistants and Nurse Practitioners) who all work together to provide you with the care you need, when you need it.  Your next appointment:   Your physician wants you to follow-up in: 3 months with Dr. Lalla Brothers in [place].  Remote monitoring is used to monitor your ICD from home. This monitoring reduces the number of office visits required to check your device to one time per year. It allows [place] to keep an eye on the functioning of your device to ensure it is working properly. You are scheduled for a device check from home on 11/11/2020. You may send your transmission at any time that day. If you have a wireless device, the transmission will be sent automatically. After your physician reviews your transmission, you will receive a postcard with your next transmission  date.   Amiodarone tablets What is this medicine? AMIODARONE (a MEE oh da rone) is an antiarrhythmic drug. It helps make your heart beat regularly. Because of the side effects caused by this medicine, it is only used when other medicines have not worked. It is usually used for heartbeat problems that may be life threatening. This medicine may be used for other purposes; ask your health care provider or pharmacist if you have questions. COMMON BRAND NAME(S): Cordarone, Pacerone What should I tell my health care provider before I take this medicine? They need to know if you have any of these conditions:  liver disease  lung disease  other heart problems  thyroid disease  an unusual or allergic reaction to amiodarone, iodine, other medicines, foods, dyes, or preservatives  pregnant or trying to get pregnant  breast-feeding How should I use this medicine? Take this medicine by mouth with a glass of water. Follow the directions on the prescription label. You can take this medicine with or without food. However, you should always take it the same way each time. Take your doses at regular intervals. Do not take your medicine more often than directed. Do not stop taking except on the advice of your doctor or health care professional. A special MedGuide will be given to you by the pharmacist with each prescription and refill. Be sure to read this information carefully each time. Talk to your pediatrician regarding the use of this medicine in  children. Special care may be needed. Overdosage: If you think you have taken too much of this medicine contact a poison control center or emergency room at once. NOTE: This medicine is only for you. Do not share this medicine with others. What if I miss a dose? If you miss a dose, take it as soon as you can. If it is almost time for your next dose, take only that dose. Do not take double or extra doses. What may interact with this medicine? Do not take  this medicine with any of the following medications:  abarelix  apomorphine  arsenic trioxide  certain antibiotics like erythromycin, gemifloxacin, levofloxacin, pentamidine  certain medicines for depression like amoxapine, tricyclic antidepressants  certain medicines for fungal infections like fluconazole, itraconazole, ketoconazole, posaconazole, voriconazole  certain medicines for irregular heart beat like disopyramide, dronedarone, ibutilide, propafenone, sotalol  certain medicines for malaria like chloroquine, halofantrine  cisapride  droperidol  haloperidol  hawthorn  maprotiline  methadone  phenothiazines like chlorpromazine, mesoridazine, thioridazine  pimozide  ranolazine  red yeast rice  vardenafil This medicine may also interact with the following medications:  antiviral medicines for HIV or AIDS  certain medicines for blood pressure, heart disease, irregular heart beat  certain medicines for cholesterol like atorvastatin, cerivastatin, lovastatin, simvastatin  certain medicines for hepatitis C like sofosbuvir and ledipasvir; sofosbuvir  certain medicines for seizures like phenytoin  certain medicines for thyroid problems  certain medicines that treat or prevent blood clots like warfarin  cholestyramine  cimetidine  clopidogrel  cyclosporine  dextromethorphan  diuretics  dofetilide  fentanyl  general anesthetics  grapefruit juice  lidocaine  loratadine  methotrexate  other medicines that prolong the QT interval (cause an abnormal heart rhythm)  procainamide  quinidine  rifabutin, rifampin, or rifapentine  St. John's Wort  trazodone  ziprasidone This list may not describe all possible interactions. Give your health care provider a list of all the medicines, herbs, non-prescription drugs, or dietary supplements you use. Also tell them if you smoke, drink alcohol, or use illegal drugs. Some items may interact with  your medicine. What should I watch for while using this medicine? Your condition will be monitored closely when you first begin therapy. Often, this drug is first started in a hospital or other monitored health care setting. Once you are on maintenance therapy, visit your doctor or health care professional for regular checks on your progress. Because your condition and use of this medicine carry some risk, it is a good idea to carry an identification card, necklace or bracelet with details of your condition, medications, and doctor or health care professional. Bonita Quin may get drowsy or dizzy. Do not drive, use machinery, or do anything that needs mental alertness until you know how this medicine affects you. Do not stand or sit up quickly, especially if you are an older patient. This reduces the risk of dizzy or fainting spells. This medicine can make you more sensitive to the sun. Keep out of the sun. If you cannot avoid being in the sun, wear protective clothing and use sunscreen. Do not use sun lamps or tanning beds/booths. You should have regular eye exams before and during treatment. Call your doctor if you have blurred vision, see halos, or your eyes become sensitive to light. Your eyes may get dry. It may be helpful to use a lubricating eye solution or artificial tears solution. If you are going to have surgery or a procedure that requires contrast dyes, tell your doctor or  health care professional that you are taking this medicine. What side effects may I notice from receiving this medicine? Side effects that you should report to your doctor or health care professional as soon as possible:  allergic reactions like skin rash, itching or hives, swelling of the face, lips, or tongue  blue-gray coloring of the skin  blurred vision, seeing blue green halos, increased sensitivity of the eyes to light  breathing problems  chest pain  dark urine  fast, irregular heartbeat  feeling faint or  light-headed  intolerance to heat or cold  nausea or vomiting  pain and swelling of the scrotum  pain, tingling, numbness in feet, hands  redness, blistering, peeling or loosening of the skin, including inside the mouth  spitting up blood  stomach pain  sweating  unusual or uncontrolled movements of body  unusually weak or tired  weight gain or loss  yellowing of the eyes or skin Side effects that usually do not require medical attention (report to your doctor or health care professional if they continue or are bothersome):  change in sex drive or performance  constipation  dizziness  headache  loss of appetite  trouble sleeping This list may not describe all possible side effects. Call your doctor for medical advice about side effects. You may report side effects to FDA at 1-800-FDA-1088. Where should I keep my medicine? Keep out of the reach of children. Store at room temperature between 20 and 25 degrees C (68 and 77 degrees F). Protect from light. Keep container tightly closed. Throw away any unused medicine after the expiration date. NOTE: This sheet is a summary. It may not cover all possible information. If you have questions about this medicine, talk to your doctor, pharmacist, or health care provider.  2021 Elsevier/Gold Standard (2018-03-19 13:44:04)

## 2020-08-23 NOTE — Progress Notes (Signed)
Electrophysiology Office Follow up Visit Note:    Date:  08/23/2020   ID:  Steve Salazar, DOB December 18, 1950, MRN [id number]  PCP:  Marygrace Drought, MD  [facility] Cardiologist:  Ida Rogue, MD  [facility] Electrophysiologist:  Vickie Epley, MD    Interval History:    Steve Salazar is a 70 y.o. male who presents for a follow up visit.  He underwent successful BiV ICD implant on May 13, 2020.  Since that time he tells me that he has felt improved energy.  He presents today for routine follow-up.  Past Medical History:  Diagnosis Date  . CKD (chronic kidney disease), stage II   . COVID-19 virus infection 03/2019  . HFrEF (heart failure with reduced ejection fraction) ([place])    a. 2011 Echo: EF 25-30%; b. 10/2016 Echo: EF 20-25%; c. 11/2019 Echo: EF 20-25%, mild LVH, Gr2 DD, sev dil LA, mod dil RA, mod MR, mild to mod AI, mod PR, Asc Ao 52m.  .Marland KitchenHypertension   . NICM (nonischemic cardiomyopathy) ([place]    a. 2011 Echo: EF 25-30%; b. 2011 MV: basal to dist inf perfusion defect->atten; c. 10/2016 Echo: EF 20-25%; d. 11/2019 Echo: EF 20-25%; e. 11/2019 MV: No ischemia. Fixed inflat defect, inflat HK, EF 17%; f. 03/2020 Cath: Nl cors. CO/CI 4.07/1.92.  .Marland KitchenPSVT (paroxysmal supraventricular tachycardia) ([facility] [place]    a. 12/2016 Event monitor: rare, short episodes of SVT.    Past Surgical History:  Procedure Laterality Date  . BIV ICD INSERTION CRT-D N/A 05/13/2020   Procedure: BIV ICD INSERTION CRT-D;  Surgeon: LVickie Epley MD;  [facility];  Service: Cardiovascular;  Laterality: N/A;  . RIGHT/LEFT HEART CATH AND CORONARY ANGIOGRAPHY Bilateral 04/01/2020   Procedure: RIGHT/LEFT HEART CATH AND CORONARY ANGIOGRAPHY;  Surgeon: GMinna Merritts MD;  [facility];  Service: Cardiovascular;  Laterality: Bilateral;    Current Medications: Current Meds  Medication Sig  . acetaminophen (TYLENOL) 500 MG tablet Take 1,000 mg by mouth every 6  (six) hours as needed for mild pain or moderate pain.  .Marland Kitchenamiodarone (PACERONE) 200 MG tablet Take 2 tablets (400 mg total) by mouth 2 (two) times daily for 7 days, THEN 1 tablet (200 mg total) daily.  . carvedilol (COREG) 25 MG tablet Take 1 tablet (25 mg total) by mouth 2 (two) times daily with a meal.  . furosemide (LASIX) 40 MG tablet TAKE 1 TABLET BY MOUTH EVERY DAY ON MONDAY AND THURSDAY EACH WEEK  . potassium chloride SA (KLOR-CON) 20 MEQ tablet Take 1 tablet (20 mEq total) by mouth daily.  . sacubitril-valsartan (ENTRESTO) 24-26 MG Take 1 tablet by mouth 2 (two) times daily.  . sildenafil (REVATIO) 20 MG tablet TAKE 1 TABLET BY MOUTH THREE TIMES DAILY IF NEEDED  . spironolactone (ALDACTONE) 25 MG tablet Take 0.5 tablets (12.5 mg total) by mouth daily.  . tamsulosin (FLOMAX) 0.4 MG CAPS capsule Take 0.4 mg by mouth daily.   . [DISCONTINUED] amiodarone (PACERONE) 200 MG tablet Take 2 tablets (400 mg total) by mouth daily for 7 days, THEN 1 tablet (200 mg total) daily.     Allergies:   Patient has no known allergies.   Social History   Socioeconomic History  . Marital status: Single    Spouse name: Not on file  . Number of children: Not on file  . Years of education: Not on file  . Highest education level: Not on file  Occupational History  .  Occupation: Full time  Tobacco Use  . Smoking status: Former Smoker    Types: Cigarettes  . Smokeless tobacco: Never Used  . Tobacco comment: Tobaco use-no  Vaping Use  . Vaping Use: Never used  Substance and Sexual Activity  . Alcohol use: Yes    Alcohol/week: 2.0 standard drinks    Types: 2 Cans of beer per week    Comment: ocassional  . Drug use: No  . Sexual activity: Not on file  Other Topics Concern  . Not on file  Social History Narrative   Pt gets regular exercise.   Social Determinants of Health   Financial Resource Strain: Not on file  Food Insecurity: Not on file  Transportation Needs: Not on file  Physical  Activity: Not on file  Stress: Not on file  Social Connections: Not on file     Family History: The patient's family history includes Heart attack in his father.  ROS:   Please see the history of present illness.    All other systems reviewed and are negative.  EKGs/Labs/Other Studies Reviewed:    The following studies were reviewed today:  August 23, 2020 device interrogation personally reviewed Battery longevity 7.5 years Lead parameters are stable Programming changes made to optimize battery longevity. Atrially pacing 3.2% Ventricular pacing 88% 19 episodes of SVT with the longest lasting 3 minutes and 37 seconds with a ventricular rate always around 100 7275 bpm.  EKG:  The ekg ordered today demonstrates atrial sensed, BiV paced rhythm.  QRS duration approximately 110 to 120 ms.   Recent Labs: 05/11/2020: Hemoglobin 12.3; Platelets 149 05/13/2020: BUN 17; Creatinine, Ser 1.19; Potassium 3.8; Sodium 139  Recent Lipid Panel    Component Value Date/Time   CHOL 208 (H) 01/26/2013 0826   TRIG 80 01/26/2013 0826   HDL 63 01/26/2013 0826   CHOLHDL 3.3 01/26/2013 0826   LDLCALC 129 (H) 01/26/2013 0826    Physical Exam:    VS:  BP 116/84   Pulse 73   Ht '5\' 11"'  (1.803 m)   Wt 204 lb 12.8 oz (92.9 kg)   SpO2 99%   BMI 28.56 kg/m     Wt Readings from Last 3 Encounters:  08/23/20 204 lb 12.8 oz (92.9 kg)  06/20/20 206 lb (93.4 kg)  05/13/20 204 lb (92.5 kg)     GEN:  Well nourished, well developed in no acute distress HEENT: Normal NECK: No JVD; No carotid bruits LYMPHATICS: No lymphadenopathy CARDIAC: RRR, no murmurs, rubs, gallops.  CRT-D pocket well-healed. RESPIRATORY:  Clear to auscultation without rales, wheezing or rhonchi  ABDOMEN: Soft, non-tender, non-distended MUSCULOSKELETAL:  No edema; No deformity  SKIN: Warm and dry NEUROLOGIC:  Alert and oriented x 3 PSYCHIATRIC:  Normal affect   ASSESSMENT:    1. NICM (nonischemic cardiomyopathy) ([place])   2.  LBBB (left bundle branch block)   3. ICD (implantable cardioverter-defibrillator) in place   4. Atrial tachycardia ([place])    PLAN:    In order of problems listed above:  1. Nonischemic cardiomyopathy with a left bundle branch block now post CRT-D implant Symptomatically improved.  NYHA class II.  Warm and dry on exam.  We need to optimize his biventricular pacing percentage.  Given the frequent PACs and PVCs contributing to the poor pacing percentage, I would like to use some amiodarone to see if we can get a higher pacing percentage.  I would like to start with 400 mg by mouth twice daily for 7 days followed  by 200 mg by mouth once daily.  I will check a complete metabolic panel, TSH, free T4 today and then we will follow-up in 3 months with repeat labs at that time.  I discussed the risks of using amiodarone with the patient his wife during today's visit and they wish to proceed.  Continue carvedilol, Entresto, spironolactone.  Continue Lasix.  2. Atrial tachycardia Carvedilol and amiodarone as above  Follow-up 3 months.  Medication Adjustments/Labs and Tests Ordered: Current medicines are reviewed at length with the patient today.  Concerns regarding medicines are outlined above.  Orders Placed This Encounter  Procedures  . Comp Met (CMET)  . TSH  . T4, free  . EKG 12-Lead   Meds ordered this encounter  Medications  . DISCONTD: amiodarone (PACERONE) 200 MG tablet    Sig: Take 2 tablets (400 mg total) by mouth daily for 7 days, THEN 1 tablet (200 mg total) daily.    Dispense:  90 tablet    Refill:  3  . amiodarone (PACERONE) 200 MG tablet    Sig: Take 2 tablets (400 mg total) by mouth 2 (two) times daily for 7 days, THEN 1 tablet (200 mg total) daily.    Dispense:  104 tablet    Refill:  3    First prescription sent was incorrect.  This is correct dosage per ordering.     Signed, Lars Mage, MD, [facility], [facility] 08/23/2020 7:24 PM    Electrophysiology [facility]

## 2020-08-24 LAB — COMPREHENSIVE METABOLIC PANEL
ALT: 23 IU/L (ref 0–44)
AST: 20 IU/L (ref 0–40)
Albumin/Globulin Ratio: 1.8 (ref 1.2–2.2)
Albumin: 4.1 g/dL (ref 3.8–4.8)
Alkaline Phosphatase: 56 IU/L (ref 44–121)
BUN/Creatinine Ratio: 15 (ref 10–24)
BUN: 18 mg/dL (ref 8–27)
Bilirubin Total: 2.3 mg/dL — ABNORMAL HIGH (ref 0.0–1.2)
CO2: 26 mmol/L (ref 20–29)
Calcium: 8.9 mg/dL (ref 8.6–10.2)
Chloride: 104 mmol/L (ref 96–106)
Creatinine, Ser: 1.19 mg/dL (ref 0.76–1.27)
Globulin, Total: 2.3 g/dL (ref 1.5–4.5)
Glucose: 93 mg/dL (ref 65–99)
Potassium: 4.6 mmol/L (ref 3.5–5.2)
Sodium: 143 mmol/L (ref 134–144)
Total Protein: 6.4 g/dL (ref 6.0–8.5)
eGFR: 66 mL/min/{1.73_m2} (ref >59)

## 2020-08-24 LAB — TSH: TSH: 1.15 u[IU]/mL (ref 0.450–4.500)

## 2020-08-24 LAB — T4, FREE: Free T4: 1.25 ng/dL (ref 0.82–1.77)

## 2020-08-30 LAB — CUP PACEART REMOTE DEVICE CHECK
Battery Remaining Longevity: 92 mo
Battery Remaining Percentage: 93 %
Battery Voltage: 2.99 V
Brady Statistic AP VP Percent: 3 %
Brady Statistic AP VS Percent: 0 %
Brady Statistic AS VP Percent: 89 %
Brady Statistic AS VS Percent: 5.9 %
Brady Statistic RA Percent Paced: 0 %
Date Time Interrogation Session: 20220426150454
HighPow Impedance: 60 Ohm
Implantable Lead Implant Date: 20220114
Implantable Lead Implant Date: 20220114
Implantable Lead Implant Date: 20220114
Implantable Lead Location: 753858
Implantable Lead Location: 753859
Implantable Lead Location: 753860
Implantable Pulse Generator Implant Date: 20220114
Lead Channel Impedance Value: 1000 Ohm
Lead Channel Impedance Value: 380 Ohm
Lead Channel Impedance Value: 450 Ohm
Lead Channel Pacing Threshold Amplitude: 0.625 V
Lead Channel Pacing Threshold Amplitude: 0.75 V
Lead Channel Pacing Threshold Amplitude: 1.125 V
Lead Channel Pacing Threshold Pulse Width: 0.5 ms
Lead Channel Pacing Threshold Pulse Width: 0.5 ms
Lead Channel Pacing Threshold Pulse Width: 0.5 ms
Lead Channel Sensing Intrinsic Amplitude: 10.2 mV
Lead Channel Sensing Intrinsic Amplitude: 4.1 mV
Lead Channel Setting Pacing Amplitude: 1.125
Lead Channel Setting Pacing Amplitude: 1.75 V
Lead Channel Setting Pacing Amplitude: 2.125
Lead Channel Setting Pacing Pulse Width: 0.5 ms
Lead Channel Setting Pacing Pulse Width: 0.5 ms
Lead Channel Setting Sensing Sensitivity: 0.5 mV
Pulse Gen Serial Number: 111033334

## 2020-09-13 NOTE — Progress Notes (Signed)
Remote ICD transmission.   

## 2020-11-02 ENCOUNTER — Other Ambulatory Visit: Payer: Self-pay | Admitting: Physician Assistant

## 2020-11-22 NOTE — Progress Notes (Signed)
Electrophysiology Office Follow up Visit Note:    Date:  11/23/2020   ID:  Steve Salazar, DOB [birth year], MRN [id number]  PCP:  Steve Sites, MD  [facility] Cardiologist:  [name], MD  [facility] Electrophysiologist:  Steve Prude, MD    Interval History:    Steve Salazar is a 70 y.o. male who presents for a follow up visit.  I last saw him August 23, 2020 for his nonischemic cardiomyopathy.  He underwent a successful biventricular ICD implant on May 13, 2020.  Despite his improved symptoms when I last saw the patient he had a low biventricular pacing percentage (88%) because of PACs and PVCs.  I started amiodarone at that time.  His biventricular pacing burden has increased to 95% on today's check.   Today he tells me he is still working.  He does feel very fatigued while exerting himself.  No syncope.  No trouble with the device.    Past Medical History:  Diagnosis Date   CKD (chronic kidney disease), stage II    COVID-19 virus infection 03/2019   HFrEF (heart failure with reduced ejection fraction) (HCC)    a. 2011 Echo: EF 25-30%; b. 10/2016 Echo: EF 20-25%; c. 11/2019 Echo: EF 20-25%, mild LVH, Gr2 DD, sev dil LA, mod dil RA, mod MR, mild to mod AI, mod PR, Asc Ao 63mm.   Hypertension    NICM (nonischemic cardiomyopathy) (HCC)    a. 2011 Echo: EF 25-30%; b. 2011 MV: basal to dist inf perfusion defect->atten; c. 10/2016 Echo: EF 20-25%; d. 11/2019 Echo: EF 20-25%; e. 11/2019 MV: No ischemia. Fixed inflat defect, inflat HK, EF 17%; f. 03/2020 Cath: Nl cors. CO/CI 4.07/1.92.   PSVT (paroxysmal supraventricular tachycardia) (HCC)    a. 12/2016 Event monitor: rare, short episodes of SVT.    Past Surgical History:  Procedure Laterality Date   BIV ICD INSERTION CRT-D N/A 05/13/2020   Procedure: BIV ICD INSERTION CRT-D;  Surgeon: Steve Prude, MD;  Location: [facility] INVASIVE CV LAB;  Service: Cardiovascular;  Laterality: N/A;   RIGHT/LEFT HEART CATH AND  CORONARY ANGIOGRAPHY Bilateral 04/01/2020   Procedure: RIGHT/LEFT HEART CATH AND CORONARY ANGIOGRAPHY;  Surgeon: Steve Iba, MD;  Location: [facility] INVASIVE CV LAB;  Service: Cardiovascular;  Laterality: Bilateral;    Current Medications: Current Meds  Medication Sig   acetaminophen (TYLENOL) 500 MG tablet Take 1,000 mg by mouth every 6 (six) hours as needed for mild pain or moderate pain.   amiodarone (PACERONE) 200 MG tablet Take 2 tablets (400 mg total) by mouth 2 (two) times daily for 7 days, THEN 1 tablet (200 mg total) daily.   carvedilol (COREG) 25 MG tablet Take 1 tablet (25 mg total) by mouth 2 (two) times daily with a meal.   carvedilol (COREG) 6.25 MG tablet Take 6.25 mg by mouth 2 (two) times daily.   clobetasol cream (TEMOVATE) 0.05 % Apply topically 2 (two) times daily.   ENTRESTO 24-26 MG TAKE 1 TABLET BY MOUTH TWICE DAILY   famotidine (PEPCID) 20 MG tablet Take by mouth.   furosemide (LASIX) 40 MG tablet TAKE 1 TABLET BY MOUTH EVERY DAY ON MONDAY AND THURSDAY EACH WEEK   potassium chloride SA (KLOR-CON) 20 MEQ tablet Take 1 tablet (20 mEq total) by mouth daily.   sildenafil (REVATIO) 20 MG tablet TAKE 1 TABLET BY MOUTH THREE TIMES DAILY IF NEEDED   spironolactone (ALDACTONE) 25 MG tablet Take 0.5 tablets (12.5 mg total) by mouth daily.  tamsulosin (FLOMAX) 0.4 MG CAPS capsule Take 0.4 mg by mouth daily.    [DISCONTINUED] sildenafil (REVATIO) 20 MG tablet Take by mouth.     Allergies:   Patient has no known allergies.   Social History   Socioeconomic History   Marital status: Single    Spouse name: Not on file   Number of children: Not on file   Years of education: Not on file   Highest education level: Not on file  Occupational History   Occupation: Full time  Tobacco Use   Smoking status: Former    Types: Cigarettes   Smokeless tobacco: Never   Tobacco comments:    Tobaco use-no  Vaping Use   Vaping Use: Never used  Substance and Sexual Activity    Alcohol use: Yes    Alcohol/week: 2.0 standard drinks    Types: 2 Cans of beer per week    Comment: ocassional   Drug use: No   Sexual activity: Not on file  Other Topics Concern   Not on file  Social History Narrative   Pt gets regular exercise.   Social Determinants of Health   Financial Resource Strain: Not on file  Food Insecurity: Not on file  Transportation Needs: Not on file  Physical Activity: Not on file  Stress: Not on file  Social Connections: Not on file     Family History: The patient's family history includes Heart attack in his father.  ROS:   Please see the history of present illness.    All other systems reviewed and are negative.  EKGs/Labs/Other Studies Reviewed:    The following studies were reviewed today:    EKG:   #1 shows atrial tachycardia with a left bundle branch block aberration #2 Shows sinus rhythm with biventricular pacing and an isolated PVC.    November 23, 2020 in clinic device interrogation personally reviewed presenting rhythm is an atrial tachycardia with an atrial rate of 130 bpm and one-to-one AV conduction Lead parameters are stable Battery longevity 7.5 years Biventricular pacing percentage 95% Atrially pacing 9.3% He has had several episodes of PMT which were appropriately treated by the device pacing maneuvers during his atrial tachycardia today confirm AT Did turn on rate responsive PVARP today  Recent Labs: 05/11/2020: Hemoglobin 12.3; Platelets 149 08/23/2020: ALT 23; BUN 18; Creatinine, Ser 1.19; Potassium 4.6; Sodium 143; TSH 1.150  Recent Lipid Panel    Component Value Date/Time   CHOL 208 (H) 01/26/2013 0826   TRIG 80 01/26/2013 0826   HDL 63 01/26/2013 0826   CHOLHDL 3.3 01/26/2013 0826   LDLCALC 129 (H) 01/26/2013 0826    Physical Exam:    VS:  Ht 5\' 11"  (1.803 m)   BMI 28.56 kg/m     Wt Readings from Last 3 Encounters:  08/23/20 204 lb 12.8 oz (92.9 kg)  06/20/20 206 lb (93.4 kg)  05/13/20 204 lb  (92.5 kg)     GEN:  Well nourished, well developed in no acute distress HEENT: Normal NECK: No JVD; No carotid bruits LYMPHATICS: No lymphadenopathy CARDIAC: RRR, no murmurs, rubs, gallops RESPIRATORY:  Clear to auscultation without rales, wheezing or rhonchi  ABDOMEN: Soft, non-tender, non-distended MUSCULOSKELETAL:  No edema; No deformity  SKIN: Warm and dry NEUROLOGIC:  Alert and oriented x 3 PSYCHIATRIC:  Normal affect   ASSESSMENT:    1. NICM (nonischemic cardiomyopathy) (HCC)   2. LBBB (left bundle branch block)   3. ICD (implantable cardioverter-defibrillator) in place    PLAN:  In order of problems listed above:   1. NICM (nonischemic cardiomyopathy) (HCC) NYHA class II-III.  Warm and dry today.  Continue carvedilol, Lasix, Entresto, spironolactone. Repeat echo today given the device has been in for 3 months with optimal medical therapy.  2. LBBB (left bundle branch block) CRT-D in place with a good biventricular pacing morphology on EKG.  3. ICD (implantable cardioverter-defibrillator) in place ICD interrogation as above.  No tachycardia therapies have been delivered.  4. Atrial tachycardia (HCC) Presenting rhythm during device interrogation was in atrial tachycardia with a ventricular rate of about 130 bpm.  The spontaneously broke during her interrogation.  Continue amiodarone for now.  Continue carvedilol.  Thankfully this seems to be a rare occurrence given his biventricular pacing percentage of 95%.  He has had recent blood work at [facility] which shows stable liver function.  I will have him follow-up with PA/NP in about 3 months.  At that appointment he should have his thyroid function reassessed.  His device should be reinterrogated at that appointment to make sure his biventricular pacing percentage is stable.  I will plan to see him back in about 1 year or sooner as needed.    Total time spent with patient today 40 minutes. This includes reviewing  records, evaluating the patient and coordinating care.   Medication Adjustments/Labs and Tests Ordered: Current medicines are reviewed at length with the patient today.  Concerns regarding medicines are outlined above.  Orders Placed This Encounter  Procedures   EKG 12-Lead   ECHOCARDIOGRAM COMPLETE   No orders of the defined types were placed in this encounter.    Signed, Steffanie Dunn, MD, [facility], [facility] 11/23/2020 4:45 PM    Electrophysiology [facility]

## 2020-11-23 ENCOUNTER — Ambulatory Visit (INDEPENDENT_AMBULATORY_CARE_PROVIDER_SITE_OTHER): Payer: Medicare Other | Admitting: Cardiology

## 2020-11-23 ENCOUNTER — Other Ambulatory Visit: Payer: Self-pay

## 2020-11-23 ENCOUNTER — Encounter: Payer: Self-pay | Admitting: Cardiology

## 2020-11-23 VITALS — Ht 71.0 in

## 2020-11-23 DIAGNOSIS — Z95810 Presence of automatic (implantable) cardiac defibrillator: Secondary | ICD-10-CM | POA: Diagnosis not present

## 2020-11-23 DIAGNOSIS — I428 Other cardiomyopathies: Secondary | ICD-10-CM | POA: Diagnosis not present

## 2020-11-23 DIAGNOSIS — I447 Left bundle-branch block, unspecified: Secondary | ICD-10-CM | POA: Diagnosis not present

## 2020-11-23 DIAGNOSIS — I471 Supraventricular tachycardia: Secondary | ICD-10-CM | POA: Diagnosis not present

## 2020-11-23 NOTE — Patient Instructions (Addendum)
Medication Instructions:  Your physician recommends that you continue on your current medications as directed. Please refer to the Current Medication list given to you today. *If you need a refill on your cardiac medications before your next appointment, please call your pharmacy*  Lab Work: None ordered. If you have labs (blood work) drawn today and your tests are completely normal, you will receive your results only by: MyChart Message (if you have MyChart) OR A paper copy in the mail If you have any lab test that is abnormal or we need to change your treatment, we will call you to review the results.  Testing/Procedures: Your physician has requested that you have an echocardiogram. Echocardiography is a painless test that uses sound waves to create images of your heart. It provides your doctor with information about the size and shape of your heart and how well your heart's chambers and valves are working. This procedure takes approximately one hour. There are no restrictions for this procedure.  Please schedule for ECHO  Follow-Up: At [facility], you and your health needs are our priority.  As part of our continuing mission to provide you with exceptional heart care, we have created designated Provider Care Teams.  These Care Teams include your primary Cardiologist (physician) and Advanced Practice Providers (APPs -  Physician Assistants and Nurse Practitioners) who all work together to provide you with the care you need, when you need it.  Your next appointment:   Your physician wants you to follow-up in: 3 months with Nicolasa Ducking, NP.     Remote monitoring is used to monitor your ICD from home. This monitoring reduces the number of office visits required to check your device to one time per year. It allows [place] to keep an eye on the functioning of your device to ensure it is working properly. You are scheduled for a device check from home on 02/21/2021. You may send your  transmission at any time that day. If you have a wireless device, the transmission will be sent automatically. After your physician reviews your transmission, you will receive a postcard with your next transmission date.

## 2021-01-06 ENCOUNTER — Other Ambulatory Visit: Payer: Medicare Other

## 2021-01-30 ENCOUNTER — Encounter: Payer: Self-pay | Admitting: Cardiovascular Disease

## 2021-01-30 ENCOUNTER — Telehealth: Payer: Self-pay | Admitting: Cardiovascular Disease

## 2021-01-30 ENCOUNTER — Other Ambulatory Visit: Payer: Self-pay

## 2021-01-30 ENCOUNTER — Ambulatory Visit (INDEPENDENT_AMBULATORY_CARE_PROVIDER_SITE_OTHER): Payer: Medicare Other | Admitting: Cardiovascular Disease

## 2021-01-30 VITALS — BP 110/82 | HR 75 | Ht 71.0 in | Wt 206.0 lb

## 2021-01-30 DIAGNOSIS — I471 Supraventricular tachycardia: Secondary | ICD-10-CM

## 2021-01-30 DIAGNOSIS — I5022 Chronic systolic (congestive) heart failure: Secondary | ICD-10-CM

## 2021-01-30 DIAGNOSIS — Z95810 Presence of automatic (implantable) cardiac defibrillator: Secondary | ICD-10-CM | POA: Diagnosis not present

## 2021-01-30 DIAGNOSIS — I428 Other cardiomyopathies: Secondary | ICD-10-CM | POA: Diagnosis not present

## 2021-01-30 MED ORDER — POTASSIUM CHLORIDE CRYS ER 20 MEQ PO TBCR: 20.0000 meq | EXTENDED_RELEASE_TABLET | Freq: Every day | ORAL | 3 refills | Status: DC

## 2021-01-30 MED ORDER — FUROSEMIDE 40 MG PO TABS: ORAL_TABLET | ORAL | 3 refills | Status: DC

## 2021-01-30 MED ORDER — METOLAZONE 5 MG PO TABS: 5.0000 mg | ORAL_TABLET | ORAL | 3 refills | Status: DC | PRN

## 2021-01-30 NOTE — Telephone Encounter (Signed)
I spoke with the patient. He states he has had increased SOB x 2 days and has had to sleep in the recliner the last couple of nights.  He is noticing right > left ankle swelling that started yesterday, but is somewhat improved today. He does notice some fullness to the abdominal area.   I inquired if he has had any dietary/ fluid intake changes over the last few days and he denies this.  He does not weigh daily.   I inquired if he is still using: - Lasix 40 mg on Mondays & Thursdays - Spironolactone 25 mg- 0.5 tablet (12.5 mg) once daily - Flomax 0.4 mg once daily   He advised he is till taking these medications, but has been using his lasix 40 mg once daily for ~ the last 2 weeks.   I have offered the patient an in office evaluation today with Dr. Mariah Milling at 3:30 pm. He is agreeable with this.

## 2021-01-30 NOTE — Patient Instructions (Addendum)
Medication Instructions:  Please START metolazone 5 mg   Take for 2 days straight Then only as needed for swelling/edema Take 30 minutes before lasix 40 mg  When you take metolazone, take an extra potassium pill  Try Omeprazole (over the counter) 20 mg up to 40 mg daily for acid reflux  If you need a refill on your cardiac medications before your next appointment, please call your pharmacy.    Lab work: No new labs needed  Testing/Procedures: No new testing needed  Follow-Up: At [facility], you and your health needs are our priority.  As part of our continuing mission to provide you with exceptional heart care, we have created designated Provider Care Teams.  These Care Teams include your primary Cardiologist (physician) and Advanced Practice Providers (APPs -  Physician Assistants and Nurse Practitioners) who all work together to provide you with the care you need, when you need it.  You will need a follow up appointment in 3 months  Providers on your designated Care Team:   Nicolasa Ducking, NP Eula Listen, PA-C Marisue Ivan, PA-C Cadence [place], [place]   COVID-19 Vaccine Information can be found at: [email or web address] For questions related to vaccine distribution or appointments, please email [email or web address] or call [phone].

## 2021-01-30 NOTE — Progress Notes (Signed)
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%,  nonischemic cardiomyopathy  negative stress test  who presents for followup of his nonischemic cardiomyopathy.,  ICD  Last seen in clinic September 2021  had covid infection 03/2019  Works in [employer] 5 days a week  Reports he is single, eats out almost every day for his meals Does not cook much  Has had worsening shortness of breath, leg swelling Has not been checking his weight at home Problems with his sleep secondary to shortness of breath, sleeping upright  Reports he is taking Lasix 40 mg daily on a regular basis as symptoms are getting worse Despite this leg swelling has persisted, shortness of breath symptoms/PND orthopnea has persisted  EKG personally reviewed by myself on todays visit Normal sinus rhythm rate 75 bpm, PVCs  Past medical history reviewed Left heart catheterization December 2021, Very difficult procedure secondary to tortuous aorta Moderately dilated aorta root, ascending aorta Nonischemic cardiomyopathy,  ICD implant by Dr. Lalla Brothers May 13, 2020 Was ready to go back to work When he does work, works in a hot environment Has been out of work following ICD placement, Reports no significant discomfort left pectoral area," healed well"  compliant with his Lasix 40 daily, carvedilol 25 twice daily, entresto 24/26 mg twice daily spironolactone 12.5 daily  ICD download reviewed with him in detail from January 2022  echocardiogram ejection fraction 20%, unchanged from prior study  PMH:   has a past medical history of CKD (chronic kidney disease), stage II, COVID-19 virus  infection (03/2019), HFrEF (heart failure with reduced ejection fraction) (HCC), Hypertension, NICM (nonischemic cardiomyopathy) (HCC), and PSVT (paroxysmal supraventricular tachycardia) (HCC).  PSH:    Past Surgical History:  Procedure Laterality Date   BIV ICD INSERTION CRT-D N/A 05/13/2020   Procedure: BIV ICD INSERTION CRT-D;  Surgeon: [name], MD;  Location: [facility] INVASIVE CV LAB;  Service: Cardiovascular;  Laterality: N/A;   RIGHT/LEFT HEART CATH AND CORONARY ANGIOGRAPHY Bilateral 04/01/2020   Procedure: RIGHT/LEFT HEART CATH AND CORONARY ANGIOGRAPHY;  Surgeon: [name], MD;  Location: [facility] INVASIVE CV LAB;  Service: Cardiovascular;  Laterality: Bilateral;    Current Outpatient Medications  Medication Sig Dispense Refill   acetaminophen (TYLENOL) 500 MG tablet Take 1,000 mg by mouth every 6 (six) hours as needed for mild pain or moderate pain.     amiodarone (PACERONE) 200 MG tablet Take 2 tablets (400 mg total) by mouth 2 (two) times daily for 7 days, THEN 1 tablet (200 mg total) daily. 104 tablet 3   carvedilol (COREG) 6.25 MG tablet Take 6.25 mg by mouth 2 (two) times daily.     clobetasol cream (TEMOVATE) 0.05 % Apply topically 2 (two) times daily.     ENTRESTO 24-26 MG TAKE 1 TABLET BY MOUTH TWICE DAILY 60 tablet 1   famotidine (PEPCID) 20 MG tablet Take by mouth.     furosemide (LASIX) 40 MG tablet TAKE 1 TABLET BY MOUTH EVERY DAY ON MONDAY AND THURSDAY EACH WEEK 30 tablet 2   Multiple Vitamins-Minerals (CENTRUM ADULTS PO) Take by mouth.     potassium chloride SA (KLOR-CON) 20  MEQ tablet Take 1 tablet (20 mEq total) by mouth daily. 90 tablet 3   sildenafil (REVATIO) 20 MG tablet TAKE 1 TABLET BY MOUTH THREE TIMES DAILY IF NEEDED 270 tablet 0   spironolactone (ALDACTONE) 25 MG tablet Take 0.5 tablets (12.5 mg total) by mouth daily. 45 tablet 0   tamsulosin (FLOMAX) 0.4 MG CAPS capsule Take 0.4 mg by mouth daily.      carvedilol (COREG) 25 MG tablet Take 1 tablet (25  mg total) by mouth 2 (two) times daily with a meal. (Patient not taking: Reported on 01/30/2021) 180 tablet 3   No current facility-administered medications for this visit.     Allergies:   Patient has no known allergies.   Social History:  The patient  reports that he has quit smoking. His smoking use included cigarettes. He has never used smokeless tobacco. He reports current alcohol use of about 2.0 standard drinks per week. He reports that he does not use drugs.   Family History:   family history includes Heart attack in his father.    Review of Systems: Review of Systems  Constitutional: Negative.   HENT: Negative.    Respiratory:  Positive for shortness of breath.   Cardiovascular:  Positive for leg swelling.  Gastrointestinal: Negative.   Musculoskeletal: Negative.   Psychiatric/Behavioral: Negative.    All other systems reviewed and are negative.  PHYSICAL EXAM: VS:  BP 110/82   Pulse 75   Ht 5\' 11"  (1.803 m)   Wt 206 lb (93.4 kg)   SpO2 97%   BMI 28.73 kg/m  , BMI Body mass index is 28.73 kg/m. Constitutional:  oriented to person, place, and time. No distress.  HENT:  Head: Grossly normal Eyes:  no discharge. No scleral icterus.  Neck: No JVD, no carotid bruits  Cardiovascular: Regular rate and rhythm, no murmurs appreciated 1+ pitting lower extremity edema Pulmonary/Chest: Clear to auscultation bilaterally, no wheezes or rails Abdominal: Soft.  no distension.  no tenderness.  Musculoskeletal: Normal range of motion Neurological:  normal muscle tone. Coordination normal. No atrophy Skin: Skin warm and dry Psychiatric: normal affect, pleasant  Recent Labs: 05/11/2020: Hemoglobin 12.3; Platelets 149 08/23/2020: ALT 23; BUN 18; Creatinine, Ser 1.19; Potassium 4.6; Sodium 143; TSH 1.150    Lipid Panel Lab Results  Component Value Date   CHOL 208 (H) 01/26/2013   HDL 63 01/26/2013   LDLCALC 129 (H) 01/26/2013   TRIG 80 01/26/2013      Wt Readings from  Last 3 Encounters:  01/30/21 206 lb (93.4 kg)  08/23/20 204 lb 12.8 oz (92.9 kg)  06/20/20 206 lb (93.4 kg)       ASSESSMENT AND PLAN:  Acute on chronic systolic CHF (congestive heart failure) (HCC) -  Significant edema on today's visit with orthopnea/PND symptoms Eating out every night, high fluid and salt intake Unclear if weight is accurate, ranged from 225 down to less than 200 pounds over the past several years -We have recommended fluid and salt restriction Suggested he take metolazone 5 mg 30 minutes before his a.m. Lasix 40 over the next 2 days with extra potassium 20 After reaching euvolemic state with improved leg swelling, would only take metolazone as needed/sparingly for worsening lower extremity edema  Dilated cardiomyopathy Cardiac catheterization no significant disease Right heart pressures were normal Recent ICD placed,  Tolerating carvedilol, spironolactone, Entresto In follow-up we will look to add Jardiance/Farxiga  Essential hypertension -  Blood pressure is well controlled on today's visit. No  changes made to the medications.  SVT/ PSVT/ PAT - Plan: EKG 12-Lead Denies any tachycardia Continue beta-blocker    Total encounter time more than 25 minutes  Greater than 50% was spent in counseling and coordination of care with the patient   Orders Placed This Encounter  Procedures   EKG 12-Lead      Signed, Dossie Arbour, M.D., Ph.D. 01/30/2021  [facility] [place], [place] [phone]

## 2021-01-30 NOTE — Telephone Encounter (Signed)
Pt c/o swelling: STAT is pt has developed SOB within 24 hours  If swelling, where is the swelling located? Right ankle  How much weight have you gained and in what time span? no  Have you gained 3 pounds in a day or 5 pounds in a week? no  Do you have a log of your daily weights (if so, list)? no  Are you currently taking a fluid pill? yes  Are you currently SOB? Yes,  he is at work, not as bad as it was   Have you traveled recently? no

## 2021-02-24 ENCOUNTER — Ambulatory Visit: Payer: Medicare Other | Admitting: Nurse Practitioner

## 2021-04-15 DIAGNOSIS — R55 Syncope and collapse: Secondary | ICD-10-CM | POA: Insufficient documentation

## 2021-04-15 DIAGNOSIS — I472 Ventricular tachycardia, unspecified: Secondary | ICD-10-CM | POA: Insufficient documentation

## 2021-04-15 DIAGNOSIS — K219 Gastro-esophageal reflux disease without esophagitis: Secondary | ICD-10-CM | POA: Insufficient documentation

## 2021-04-25 ENCOUNTER — Telehealth: Payer: Self-pay | Admitting: Cardiovascular Disease

## 2021-04-25 NOTE — Telephone Encounter (Signed)
Patient sister Eber Jones calling States at the hospital they were talking about possible open heart surgery Would like to discuss with nurse Please call

## 2021-04-25 NOTE — Telephone Encounter (Signed)
Was able to return call to pt's sister Steve Salazar [facility] approved). She reports Steve Salazar has been at [facility] since 12/17 for   Diagnoses   Syncope Cardiac arrhythmia, Chronic systolic CHF  Nonischemic cardiomyopathy  VT  SVT   Steve Salazar voiced concern that [facility] wants Steve Salazar to have open heart surgery, that is emergent,  pt and family is reluctant and wants Steve Salazar option. Advised Steve Salazar can discuss this at upcoming appt on 1/4 at 3:00 PM, but delaying care to have Steve Salazar weight on to declare if open heart surgery is needed, could delay care for pt and potential progression in his current symptoms.    Steve Salazar verbalized understanding, but reports pt was d/c from [facility] today as he wanted to come home to think about his options before proceeding. Steve Salazar question if Steve Salazar could do sx, advised that is not Steve Salazar speciality, if pt needing heart sx, we refer them to [facility] thoracic surgery for consult and they can decide in heart surgery/bypass.  If we did that at aptp with Steve Salazar on the 4th, then that could possibly delay Steve Salazar treatment further as referrals can take a month or more to get in for first consults. Again, sister verbalized understanding.  At this time, questions were address and no additional concerns at this time. Steve Salazar thankful for the return call and advice. Agreeable to keep appt with Steve Salazar for Steve Salazar to discuss further.

## 2021-04-30 DIAGNOSIS — I509 Heart failure, unspecified: Secondary | ICD-10-CM | POA: Diagnosis not present

## 2021-05-01 DIAGNOSIS — I509 Heart failure, unspecified: Secondary | ICD-10-CM | POA: Diagnosis not present

## 2021-05-02 DIAGNOSIS — I509 Heart failure, unspecified: Secondary | ICD-10-CM | POA: Diagnosis not present

## 2021-05-03 ENCOUNTER — Other Ambulatory Visit: Payer: Self-pay

## 2021-05-03 ENCOUNTER — Encounter: Payer: Self-pay | Admitting: Cardiovascular Disease

## 2021-05-03 ENCOUNTER — Ambulatory Visit: Payer: Medicare HMO | Admitting: Cardiovascular Disease

## 2021-05-03 VITALS — BP 92/70 | HR 90 | Ht 71.0 in | Wt 186.2 lb

## 2021-05-03 DIAGNOSIS — I428 Other cardiomyopathies: Secondary | ICD-10-CM

## 2021-05-03 DIAGNOSIS — I509 Heart failure, unspecified: Secondary | ICD-10-CM | POA: Diagnosis not present

## 2021-05-03 DIAGNOSIS — I5022 Chronic systolic (congestive) heart failure: Secondary | ICD-10-CM | POA: Diagnosis not present

## 2021-05-03 DIAGNOSIS — I10 Essential (primary) hypertension: Secondary | ICD-10-CM

## 2021-05-03 DIAGNOSIS — Z95810 Presence of automatic (implantable) cardiac defibrillator: Secondary | ICD-10-CM

## 2021-05-03 DIAGNOSIS — I4719 Other supraventricular tachycardia: Secondary | ICD-10-CM

## 2021-05-03 DIAGNOSIS — I471 Supraventricular tachycardia, unspecified: Secondary | ICD-10-CM

## 2021-05-03 DIAGNOSIS — N182 Chronic kidney disease, stage 2 (mild): Secondary | ICD-10-CM | POA: Diagnosis not present

## 2021-05-03 MED ORDER — METOLAZONE 5 MG PO TABS: ORAL_TABLET | ORAL | 3 refills | Status: DC

## 2021-05-03 MED ORDER — ENTRESTO 24-26 MG PO TABS: 1.0000 | ORAL_TABLET | Freq: Every day | ORAL | 3 refills | Status: DC

## 2021-05-03 MED ORDER — POTASSIUM CHLORIDE CRYS ER 20 MEQ PO TBCR: 20.0000 meq | EXTENDED_RELEASE_TABLET | Freq: Every day | ORAL | 3 refills | Status: DC

## 2021-05-03 NOTE — Progress Notes (Signed)
Cardiology Office Note  Date:  05/03/2021   ID:  YANSEL BENTZ, DOB 1950-12-29, MRN [id number]  PCP:  Marygrace Drought, MD   Chief Complaint  Patient presents with   3 month follow up     LVAD Cardiac Clearance "Doing well." Medications reviewed by the patient's bottles.     HPI:  Mr. 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   Device interrogated showed multiple episodes of VT/VF/SVT with many successful ATP events and one shock event for aFib on 12/17.   Lactate elevated initially to 3.2. Hs trop 166, BNP 1271.  Amiodarone started and discontinued 2/2 worsening t bili and LFTs.   Started on Dobutamine transitioning to milrinone for nonsustained VT  echo 04/15/2021 with EF of 15 to 20%  Underwent AV node ablation for aFib resulting in shocks from ICD on 04/18/2021 by Dr. Shara Blazing.   Discharged on  Milrinone infusion via R brachial PICC line that was placed on 04/22/21 at 0.2 mcg/Kg/min  Treated with Lasix 120mg  IV BID , transitioned to Toresemide 80mg  daily.  At discharge changed to torsemide 40 mg  Lost 10 pounds during his admission Notes indicating plan to  "continue holding Coreg, Entresto, sildenafil with plan of potentially placing  LVAD in the upcoming weeks. " Continue home dose Aldactone.   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 1.7 initially, likely cardiorenal in origin. Manage and trend as shock is treated above.   Direct Hyperbilirubinemia   Congested hepatopathy  Abdominal ultrasound 04/18/2021 negative for acute pathology except for possible left renal cyst.  Review of labs CR 1.8 BUN 29  Weight 186 today, weight in 01/2021 was 206 Reports he was not eating during hospital admission, weight loss from low calorie intake Has slowly started eating more, weight stable Has not been taking much metolazone, is taking torsemide 40 daily Does not appear he is on spironolactone, Coreg, Entresto  Long discussion with him concerning recent events, He does not desire placement of LVAD at this time, would like another shot at medications He does not know if the team at [facility] is aware but has follow-up appointment Jan 26th appt with [facility] Per the patient's wife  Getting used to milrinone infusion Realizes it is a bridge Denies having abdominal fullness, typical location for his fluid retention Some mild ankle swelling  He has since retired, Wife present for discussion today, also they called his sister in [place]  EKG personally reviewed by myself on todays visit Paced rhythm rate 90 bpm  Past medical history reviewed Left heart catheterization December 2021, Very difficult procedure secondary to tortuous aorta Moderately dilated aorta root, ascending aorta Nonischemic cardiomyopathy   PMH:   has a past medical history of CKD (chronic kidney disease), stage II, COVID-19 virus infection (03/2019),  HFrEF (heart failure with reduced ejection fraction) ([place]), Hypertension, NICM (nonischemic cardiomyopathy) ([place]), and PSVT (paroxysmal supraventricular tachycardia) ([place]).  PSH:    Past Surgical History:  Procedure Laterality Date   BIV ICD INSERTION CRT-D N/A 05/13/2020    Procedure: BIV ICD INSERTION CRT-D;  Surgeon: Vickie Epley, MD;  Location: [place] CV LAB;  Service: Cardiovascular;  Laterality: N/A;   RIGHT/LEFT HEART CATH AND CORONARY ANGIOGRAPHY Bilateral 04/01/2020   Procedure: RIGHT/LEFT HEART CATH AND CORONARY ANGIOGRAPHY;  Surgeon: Minna Merritts, MD;  Location: [place] CV LAB;  Service: Cardiovascular;  Laterality: Bilateral;    Current Outpatient Medications  Medication Sig Dispense Refill   acetaminophen (TYLENOL) 500 MG tablet Take 1,000 mg by mouth every 6 (six) hours as needed for mild pain or moderate pain.     diclofenac Sodium (VOLTAREN) 1 % GEL Apply topically.     famotidine (PEPCID) 20 MG tablet Take 20 mg by mouth as needed.     metolazone (ZAROXOLYN) 5 MG tablet Take 1 tablet (5 mg total) by mouth as needed. For swelling/edema Take 30 mins before lasix 30 tablet 3   Multiple Vitamins-Minerals (CENTRUM ADULTS PO) Take by mouth.     potassium chloride SA (KLOR-CON) 20 MEQ tablet Take 1 tablet (20 mEq total) by mouth daily. Take an extra potassium when you take metolazone 100 tablet 3   tamsulosin (FLOMAX) 0.4 MG CAPS capsule Take 0.4 mg by mouth daily.      torsemide (DEMADEX) 20 MG tablet Take 2 tablets by mouth daily.     No current facility-administered medications for this visit.     Allergies:   Patient has no known allergies.   Social History:  The patient  reports that he has quit smoking. His smoking use included cigarettes. He has never used smokeless tobacco. He reports current alcohol use of about 2.0 standard drinks per week. He reports that he does not use drugs.   Family History:   family history includes Heart attack in his father.    Review of Systems: Review of Systems  Constitutional: Negative.   HENT: Negative.    Respiratory:  Positive for shortness of breath.   Cardiovascular:  Positive for leg swelling.  Gastrointestinal: Negative.   Musculoskeletal: Negative.   Psychiatric/Behavioral:  Negative.    All other systems reviewed and are negative.  PHYSICAL EXAM: VS:  BP 92/70 (BP Location: Left Arm, Patient Position: Sitting, Cuff Size: Normal)    Pulse 90    Ht 5\' 11"  (1.803 m)    Wt 186 lb 4 oz (84.5 kg)    SpO2 98%    BMI 25.98 kg/m  , BMI Body mass index is 25.98 kg/m. Constitutional:  oriented to person, place, and time. No distress.  HENT:  Head: Grossly normal Eyes:  no discharge. No scleral icterus.  Neck: No JVD, no carotid bruits  Cardiovascular: Regular rate and rhythm, no murmurs appreciated Pulmonary/Chest: Clear to auscultation bilaterally, no wheezes or rails Abdominal: Soft.  no distension.  no tenderness.  Musculoskeletal: Normal range of motion Neurological:  normal muscle tone. Coordination normal. No atrophy Skin: Skin warm and dry Psychiatric: normal affect, pleasant   Recent Labs: 05/11/2020: Hemoglobin 12.3; Platelets 149 08/23/2020: ALT 23; BUN 18; Creatinine, Ser 1.19; Potassium 4.6; Sodium 143; TSH 1.150    Lipid Panel Lab Results  Component Value Date   CHOL 208 (H) 01/26/2013   HDL 63 01/26/2013   LDLCALC 129 (H) 01/26/2013   TRIG 80 01/26/2013  Wt Readings from Last 3 Encounters:  05/03/21 186 lb 4 oz (84.5 kg)  01/30/21 206 lb (93.4 kg)  08/23/20 204 lb 12.8 oz (92.9 kg)       ASSESSMENT AND PLAN:  Acute on chronic systolic CHF (congestive heart failure) ([place]) -  Long discussion concerning treatment options, He has indicated he is not ready for LVAD He will discuss this with his [facility] team Currently on milrinone infusion There are few options, As he does not want more invasive procedures, will recommend he try to restart some of his medications Encouraged him to start very low doses Entresto 0.5 pill of the 24/26 mg pill once a day.  We will need to proceed cautiously, systolic pressure 90.  Would likely be hard to titrate him back onto his medications given recent 20 pound weight loss after hospital admission -I am  concerned about fluid retention, previously failed torsemide alone -Recommended he add metolazone 2.5 up to 5 mg at least twice a week  Dilated cardiomyopathy Cardiac catheterization no significant disease Previously tolerated carvedilol spironolactone Entresto, these have been held, limited by hypotension -Some component of medication noncompliance in the past, dietary noncompliance Off amiodarone when he had VT/VF requiring ICD, decompensation and admission to [facility] -On milrinone infusion  Nonsustained VT/VF/SVT Had AV node ablation at [facility]   We have recommended that we see him back several weeks after he is evaluated at [facility] and has further discussions with them  Total encounter time more than 60 minutes  Greater than 50% was spent in counseling and coordination of care with the patient   Orders Placed This Encounter  Procedures   EKG 12-Lead      Signed, Esmond Plants, M.D., Ph.D. 05/03/2021  [place], [place] [phone]

## 2021-05-03 NOTE — Patient Instructions (Addendum)
Medication Instructions:  Please restart  entresto 1/2 of a 24/26 mg pill once a day Monitor blood pressure  Please add metolazone  2.5 up to 5 mg twice a day With torsemide 40 mg daily  Potassium 20 daily Extra potassium when taking metolazone  If you need a refill on your cardiac medications before your next appointment, please call your pharmacy.   Lab work: No new labs needed  Testing/Procedures: No new testing needed  Follow-Up: At [facility], you and your health needs are our priority.  As part of our continuing mission to provide you with exceptional heart care, we have created designated Provider Care Teams.  These Care Teams include your primary Cardiologist (physician) and Advanced Practice Providers (APPs -  Physician Assistants and Nurse Practitioners) who all work together to provide you with the care you need, when you need it.  You will need a follow up appointment in 8 weeks   Providers on your designated Care Team:   Nicolasa Ducking, NP Eula Listen, PA-C Cadence Fransico Michael, [place]  COVID-19 Vaccine Information can be found at: [email or web address].[email or web address] For questions related to vaccine distribution or appointments, please email [email or web address] or call [phone].

## 2021-05-04 DIAGNOSIS — I509 Heart failure, unspecified: Secondary | ICD-10-CM | POA: Diagnosis not present

## 2021-05-05 DIAGNOSIS — I509 Heart failure, unspecified: Secondary | ICD-10-CM | POA: Diagnosis not present

## 2021-05-06 DIAGNOSIS — I509 Heart failure, unspecified: Secondary | ICD-10-CM | POA: Diagnosis not present

## 2021-05-07 DIAGNOSIS — I509 Heart failure, unspecified: Secondary | ICD-10-CM | POA: Diagnosis not present

## 2021-05-08 DIAGNOSIS — I509 Heart failure, unspecified: Secondary | ICD-10-CM | POA: Diagnosis not present

## 2021-05-08 DIAGNOSIS — I5020 Unspecified systolic (congestive) heart failure: Secondary | ICD-10-CM | POA: Diagnosis not present

## 2021-05-08 DIAGNOSIS — Z95810 Presence of automatic (implantable) cardiac defibrillator: Secondary | ICD-10-CM | POA: Diagnosis not present

## 2021-05-08 DIAGNOSIS — I472 Ventricular tachycardia, unspecified: Secondary | ICD-10-CM | POA: Diagnosis not present

## 2021-05-08 DIAGNOSIS — I5022 Chronic systolic (congestive) heart failure: Secondary | ICD-10-CM | POA: Diagnosis not present

## 2021-05-08 DIAGNOSIS — N1832 Chronic kidney disease, stage 3b: Secondary | ICD-10-CM | POA: Diagnosis not present

## 2021-05-08 DIAGNOSIS — I10 Essential (primary) hypertension: Secondary | ICD-10-CM | POA: Diagnosis not present

## 2021-05-09 DIAGNOSIS — I509 Heart failure, unspecified: Secondary | ICD-10-CM | POA: Diagnosis not present

## 2021-05-10 DIAGNOSIS — G4739 Other sleep apnea: Secondary | ICD-10-CM | POA: Diagnosis not present

## 2021-05-10 DIAGNOSIS — I509 Heart failure, unspecified: Secondary | ICD-10-CM | POA: Diagnosis not present

## 2021-05-13 DIAGNOSIS — I509 Heart failure, unspecified: Secondary | ICD-10-CM | POA: Diagnosis not present

## 2021-05-14 DIAGNOSIS — I509 Heart failure, unspecified: Secondary | ICD-10-CM | POA: Diagnosis not present

## 2021-05-15 DIAGNOSIS — I509 Heart failure, unspecified: Secondary | ICD-10-CM | POA: Diagnosis not present

## 2021-05-16 DIAGNOSIS — I509 Heart failure, unspecified: Secondary | ICD-10-CM | POA: Diagnosis not present

## 2021-05-17 DIAGNOSIS — I509 Heart failure, unspecified: Secondary | ICD-10-CM | POA: Diagnosis not present

## 2021-05-18 DIAGNOSIS — I509 Heart failure, unspecified: Secondary | ICD-10-CM | POA: Diagnosis not present

## 2021-05-19 DIAGNOSIS — I509 Heart failure, unspecified: Secondary | ICD-10-CM | POA: Diagnosis not present

## 2021-05-20 DIAGNOSIS — I509 Heart failure, unspecified: Secondary | ICD-10-CM | POA: Diagnosis not present

## 2021-05-21 DIAGNOSIS — I509 Heart failure, unspecified: Secondary | ICD-10-CM | POA: Diagnosis not present

## 2021-05-22 DIAGNOSIS — I509 Heart failure, unspecified: Secondary | ICD-10-CM | POA: Diagnosis not present

## 2021-05-23 DIAGNOSIS — I509 Heart failure, unspecified: Secondary | ICD-10-CM | POA: Diagnosis not present

## 2021-05-24 DIAGNOSIS — I509 Heart failure, unspecified: Secondary | ICD-10-CM | POA: Diagnosis not present

## 2021-05-25 DIAGNOSIS — I10 Essential (primary) hypertension: Secondary | ICD-10-CM | POA: Diagnosis not present

## 2021-05-25 DIAGNOSIS — R972 Elevated prostate specific antigen [PSA]: Secondary | ICD-10-CM | POA: Diagnosis not present

## 2021-05-25 DIAGNOSIS — Z8739 Personal history of other diseases of the musculoskeletal system and connective tissue: Secondary | ICD-10-CM | POA: Diagnosis not present

## 2021-05-25 DIAGNOSIS — I5022 Chronic systolic (congestive) heart failure: Secondary | ICD-10-CM | POA: Diagnosis not present

## 2021-05-26 DIAGNOSIS — I509 Heart failure, unspecified: Secondary | ICD-10-CM | POA: Diagnosis not present

## 2021-05-29 DIAGNOSIS — I509 Heart failure, unspecified: Secondary | ICD-10-CM | POA: Diagnosis not present

## 2021-05-31 DIAGNOSIS — I509 Heart failure, unspecified: Secondary | ICD-10-CM | POA: Diagnosis not present

## 2021-06-01 DIAGNOSIS — I509 Heart failure, unspecified: Secondary | ICD-10-CM | POA: Diagnosis not present

## 2021-06-05 DIAGNOSIS — I10 Essential (primary) hypertension: Secondary | ICD-10-CM | POA: Diagnosis not present

## 2021-06-05 DIAGNOSIS — I428 Other cardiomyopathies: Secondary | ICD-10-CM | POA: Diagnosis not present

## 2021-06-05 DIAGNOSIS — Z95810 Presence of automatic (implantable) cardiac defibrillator: Secondary | ICD-10-CM | POA: Diagnosis not present

## 2021-06-05 DIAGNOSIS — N1831 Chronic kidney disease, stage 3a: Secondary | ICD-10-CM | POA: Diagnosis not present

## 2021-06-05 DIAGNOSIS — I5022 Chronic systolic (congestive) heart failure: Secondary | ICD-10-CM | POA: Diagnosis not present

## 2021-06-05 DIAGNOSIS — I472 Ventricular tachycardia, unspecified: Secondary | ICD-10-CM | POA: Diagnosis not present

## 2021-06-06 DIAGNOSIS — I509 Heart failure, unspecified: Secondary | ICD-10-CM | POA: Diagnosis not present

## 2021-06-09 DIAGNOSIS — I509 Heart failure, unspecified: Secondary | ICD-10-CM | POA: Diagnosis not present

## 2021-06-10 ENCOUNTER — Other Ambulatory Visit: Payer: Self-pay | Admitting: Cardiovascular Disease

## 2021-06-14 DIAGNOSIS — I509 Heart failure, unspecified: Secondary | ICD-10-CM | POA: Diagnosis not present

## 2021-06-15 DIAGNOSIS — I509 Heart failure, unspecified: Secondary | ICD-10-CM | POA: Diagnosis not present

## 2021-06-17 DIAGNOSIS — I509 Heart failure, unspecified: Secondary | ICD-10-CM | POA: Diagnosis not present

## 2021-06-22 DIAGNOSIS — I509 Heart failure, unspecified: Secondary | ICD-10-CM | POA: Diagnosis not present

## 2021-06-25 DIAGNOSIS — I509 Heart failure, unspecified: Secondary | ICD-10-CM | POA: Diagnosis not present

## 2021-06-29 DIAGNOSIS — I509 Heart failure, unspecified: Secondary | ICD-10-CM | POA: Diagnosis not present

## 2021-07-02 DIAGNOSIS — I509 Heart failure, unspecified: Secondary | ICD-10-CM | POA: Diagnosis not present

## 2021-07-03 ENCOUNTER — Ambulatory Visit: Payer: Medicare HMO | Admitting: Cardiovascular Disease

## 2021-07-03 ENCOUNTER — Encounter: Payer: Self-pay | Admitting: Cardiovascular Disease

## 2021-07-03 ENCOUNTER — Other Ambulatory Visit: Payer: Self-pay

## 2021-07-03 VITALS — BP 100/80 | HR 95 | Ht 71.0 in | Wt 190.4 lb

## 2021-07-03 DIAGNOSIS — I471 Supraventricular tachycardia, unspecified: Secondary | ICD-10-CM

## 2021-07-03 DIAGNOSIS — I5022 Chronic systolic (congestive) heart failure: Secondary | ICD-10-CM | POA: Diagnosis not present

## 2021-07-03 DIAGNOSIS — I428 Other cardiomyopathies: Secondary | ICD-10-CM | POA: Diagnosis not present

## 2021-07-03 DIAGNOSIS — N182 Chronic kidney disease, stage 2 (mild): Secondary | ICD-10-CM

## 2021-07-03 DIAGNOSIS — I10 Essential (primary) hypertension: Secondary | ICD-10-CM | POA: Diagnosis not present

## 2021-07-03 DIAGNOSIS — Z95810 Presence of automatic (implantable) cardiac defibrillator: Secondary | ICD-10-CM

## 2021-07-03 DIAGNOSIS — I4719 Other supraventricular tachycardia: Secondary | ICD-10-CM

## 2021-07-03 MED ORDER — COLCHICINE 0.6 MG PO TABS: 0.6000 mg | ORAL_TABLET | Freq: Two times a day (BID) | ORAL | 1 refills | Status: DC | PRN

## 2021-07-03 NOTE — Patient Instructions (Addendum)
Medication Instructions:  ?Stop the lasix/furosemide ?Stay on the torsemide 40 daily ?Metolazone 2.5-5 mg as needed ? ?Talk to [facility] about [place] /Jardiance ? ?Talk about [place] ? ?If you need a refill on your cardiac medications before your next appointment, please call your pharmacy.  ? ?Lab work: ?No new labs needed ? ?Testing/Procedures: ?No new testing needed ? ?Follow-Up: ?At [facility], you and your health needs are our priority.  As part of our continuing mission to provide you with exceptional heart care, we have created designated Provider Care Teams.  These Care Teams include your primary Cardiologist (physician) and Advanced Practice Providers (APPs -  Physician Assistants and Nurse Practitioners) who all work together to provide you with the care you need, when you need it. ? ?You will need a follow up appointment in 3 months ? ?Providers on your designated Care Team:   ?Murray Hodgkins, NP ?Christell Faith, PA-C ?Cadence Kathlen Mody, PA-C ? ?COVID-19 Vaccine Information can be found at: [email or web address].uk For questions related to vaccine distribution or appointments, please email [email or web address] or call [phone].  ? ?

## 2021-07-03 NOTE — Progress Notes (Signed)
Cardiology Office Note  Date:  07/03/2021   ID:  Steve Salazar, DOB [birth year], MRN [id number]  PCP:  Marygrace Drought, MD   Chief Complaint  Patient presents with   PHQ-9 8 Week Follow-up    "Doing well." Medications reviewed by the patient's bottles.     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 Quentin Ore May 13, 2020 Syncope, VT/VF ICD shock, cardiogenic shock admission [facility] December 2022 Milrinone dependent who presents for followup of his nonischemic cardiomyopathy,ICD  Last seen in clinic by myself May 03, 2021 We had started low-dose Entresto  Followed by [facility] cardiology as well as here Most recent office visit June 05, 2021 at [facility] was started on losartan 25 daily On spironolactone 25, did not tolerate Entresto secondary to hypotension, concern for elevated creatinine  Potassium held by [facility] Not on beta-blocker as he is on milrinone infusion  Torsemide was decreased to 40mg  daily. His metolazone was changed to prn only for weight >188lbs at home.   On today's visit, discussion of all of his medications which he brings with him except for his metolazone Taking both lasix 20 mg daily and torsemide 40 mg daily at home Likely medication confusion  Reluctant to proceed with surgery at this time Continues to hope he can wean off the milrinone infusion and get back to the life the way it was in the past " Need to get back to work"  On the milrinone reports that he feels well, denies shortness of breath or chest discomfort, no leg swelling no abdominal bloating, no significant weight change Averaging 185 pounds at home  EKG personally reviewed by myself on todays visit Normal sinus rhythm  paced  Other past medical history reviewed  Hospital course at [facility] on chronic 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   Device interrogated showed multiple episodes of VT/VF/SVT with many successful ATP events and one shock event for aFib on 12/17.   Lactate elevated initially to 3.2. Hs trop 166, BNP 1271.  Amiodarone started and discontinued 2/2 worsening t bili and LFTs.   Started on Dobutamine transitioning to milrinone for nonsustained VT  echo 04/15/2021 with EF of 15 to 20%  Underwent AV node ablation for aFib resulting in shocks from ICD on 04/18/2021 by Dr. Shara Blazing.   Discharged on  Milrinone infusion via R brachial PICC line that was placed on 04/22/21 at 0.2 mcg/Kg/min  Treated with Lasix 120mg  IV BID , transitioned to Toresemide 80mg  daily.  At discharge changed to torsemide 40 mg  Lost 10 pounds during his admission Notes indicating plan to  "continue holding Coreg, Entresto, sildenafil with plan of potentially placing LVAD in the upcoming weeks. " Continue home dose Aldactone.   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 1.7 initially, likely cardiorenal in origin. Manage and trend as shock is treated above.   Direct Hyperbilirubinemia   Congested hepatopathy  Abdominal ultrasound 04/18/2021 negative for acute pathology except for possible left renal cyst.   Left heart catheterization December 2021, Very difficult procedure  secondary to tortuous aorta Moderately dilated aorta root, ascending aorta Nonischemic cardiomyopathy  PMH:   has a past medical history of CKD (chronic kidney disease), stage II, COVID-19 virus infection (03/2019), HFrEF (heart failure with reduced ejection fraction) ([place]), Hypertension, NICM (nonischemic cardiomyopathy) ([place]), and  PSVT (paroxysmal supraventricular tachycardia) ([place]).  PSH:    Past Surgical History:  Procedure Laterality Date   BIV ICD INSERTION CRT-D N/A 05/13/2020   Procedure: BIV ICD INSERTION CRT-D;  Surgeon: Vickie Epley, MD;  Location: [place] [facility];  Service: Cardiovascular;  Laterality: N/A;   RIGHT/LEFT HEART CATH AND CORONARY ANGIOGRAPHY Bilateral 04/01/2020   Procedure: RIGHT/LEFT HEART CATH AND CORONARY ANGIOGRAPHY;  Surgeon: Minna Merritts, MD;  Location: [place] [facility];  Service: Cardiovascular;  Laterality: Bilateral;    Current Outpatient Medications on File Prior to Visit  Medication Sig Dispense Refill   acetaminophen (TYLENOL) 500 MG tablet Take 1,000 mg by mouth every 6 (six) hours as needed for mild pain or moderate pain.     diclofenac Sodium (VOLTAREN) 1 % GEL Apply topically.     famotidine (PEPCID) 20 MG tablet Take 20 mg by mouth as needed.     losartan (COZAAR) 25 MG tablet Take 1 tablet by mouth daily.     magnesium oxide (MAG-OX) 400 MG tablet Take by mouth.     metolazone (ZAROXOLYN) 5 MG tablet Take 2.5 up to 5 mg twice a day as needed 30 mins before torsemide 90 tablet 0   Multiple Vitamins-Minerals (CENTRUM ADULTS PO) Take by mouth.     predniSONE (DELTASONE) 20 MG tablet Take 20 mg by mouth 2 (two) times daily as needed.     sildenafil (REVATIO) 20 MG tablet Take 1 tablet by mouth 3 (three) times daily.     spironolactone (ALDACTONE) 25 MG tablet Take 0.5 tablets (12.5 mg total) by mouth daily. 45 tablet 0   tamsulosin (FLOMAX) 0.4 MG CAPS capsule Take 0.4 mg by mouth daily.      torsemide (DEMADEX) 20 MG tablet Take 2 tablets by mouth daily.     No current facility-administered medications on file prior to visit.       Allergies:   Patient has no known allergies.   Social History:  The patient  reports that he has quit smoking. His smoking use included cigarettes. He has never used smokeless tobacco. He reports current alcohol use of about  2.0 standard drinks per week. He reports that he does not use drugs.   Family History:   family history includes Heart attack in his father.    Review of Systems: Review of Systems  Constitutional: Negative.   HENT: Negative.    Respiratory:  Positive for shortness of breath.   Cardiovascular:  Positive for leg swelling.  Gastrointestinal: Negative.   Musculoskeletal: Negative.   Psychiatric/Behavioral: Negative.    All other systems reviewed and are negative.  PHYSICAL EXAM: VS:  BP 100/80 (BP Location: Left Arm, Patient Position: Sitting, Cuff Size: Normal)    Pulse 95    Ht 5\' 11"  (1.803 m)    Wt 190 lb 6 oz (86.4 kg)    SpO2 98%    BMI 26.55 kg/m  , BMI Body mass index is 26.55 kg/m. Constitutional:  oriented to person, place, and time. No distress.  HENT:  Head: Grossly normal Eyes:  no discharge. No scleral icterus.  Neck:  JVD 8+, no carotid bruits  Cardiovascular: Regular rate and rhythm, no murmurs appreciated Pulmonary/Chest: Clear  to auscultation bilaterally, no wheezes or rails Abdominal: Soft.  no distension.  no tenderness.  Musculoskeletal: Normal range of motion Neurological:  normal muscle tone. Coordination normal. No atrophy Skin: Skin warm and dry Psychiatric: normal affect, pleasant  Recent Labs: 08/23/2020: ALT 23; BUN 18; Creatinine, Ser 1.19; Potassium 4.6; Sodium 143; TSH 1.150    Lipid Panel Lab Results  Component Value Date   CHOL 208 (H) 01/26/2013   HDL 63 01/26/2013   LDLCALC 129 (H) 01/26/2013   TRIG 80 01/26/2013      Wt Readings from Last 3 Encounters:  07/03/21 190 lb 6 oz (86.4 kg)  05/03/21 186 lb 4 oz (84.5 kg)  01/30/21 206 lb (93.4 kg)       ASSESSMENT AND PLAN:  Acute on chronic systolic CHF (congestive heart failure) ([place]) -  Long discussion with him, again as on prior clinic visit not ready for LVAD Discussed risk and benefit Prefers to continue on milrinone infusion but hoping this could be weaned off Appears to be  tolerating current medications, we will stop Lasix as he was taking both Lasix and torsemide Has not had to take much metolazone Has weekly calls with CHF clinic at [facility], Recommend he discuss Farxiga/Jardiance with them.  We may have samples if he would like to try before filling a prescription Additional options for medications include Verquvo Both of the above should not drop blood pressure but may need to decrease torsemide down to 20 daily with the SGLT2 inhibitor  Dilated cardiomyopathy Cardiac catheterization no significant disease -On milrinone infusion Has been relatively stable past 2 months  Nonsustained VT/VF/SVT Had AV node ablation at [facility]   Total encounter time more than 40 minutes  Greater than 50% was spent in counseling and coordination of care with the patient    Orders Placed This Encounter  Procedures   EKG 12-Lead      Signed, Esmond Plants, M.D., Ph.D. 07/03/2021  [place], [place]

## 2021-07-04 ENCOUNTER — Other Ambulatory Visit: Payer: Self-pay | Admitting: *Deleted

## 2021-07-06 DIAGNOSIS — I509 Heart failure, unspecified: Secondary | ICD-10-CM | POA: Diagnosis not present

## 2021-07-08 DIAGNOSIS — I509 Heart failure, unspecified: Secondary | ICD-10-CM | POA: Diagnosis not present

## 2021-07-13 DIAGNOSIS — I509 Heart failure, unspecified: Secondary | ICD-10-CM | POA: Diagnosis not present

## 2021-07-14 DIAGNOSIS — I5020 Unspecified systolic (congestive) heart failure: Secondary | ICD-10-CM | POA: Diagnosis not present

## 2021-07-14 DIAGNOSIS — I509 Heart failure, unspecified: Secondary | ICD-10-CM | POA: Diagnosis not present

## 2021-07-19 ENCOUNTER — Other Ambulatory Visit: Payer: Self-pay

## 2021-07-19 ENCOUNTER — Ambulatory Visit
Admission: EM | Admit: 2021-07-19 | Discharge: 2021-07-19 | Disposition: A | Discharge status: Home / Self Care | Payer: Medicare HMO

## 2021-07-19 DIAGNOSIS — M109 Gout, unspecified: Secondary | ICD-10-CM

## 2021-07-19 DIAGNOSIS — M7989 Other specified soft tissue disorders: Secondary | ICD-10-CM | POA: Diagnosis not present

## 2021-07-19 DIAGNOSIS — L03113 Cellulitis of right upper limb: Secondary | ICD-10-CM

## 2021-07-19 MED ORDER — CEPHALEXIN 500 MG PO CAPS: 500.0000 mg | ORAL_CAPSULE | Freq: Four times a day (QID) | ORAL | 0 refills | Status: AC

## 2021-07-19 NOTE — ED Triage Notes (Signed)
Patient is here for "Right middle finger pain, swelling". No injury. Hurts to move it.  ?

## 2021-07-19 NOTE — ED Provider Notes (Signed)
?[place] ? ? ? ?CSN: [id number] ?Arrival date & time: 07/19/21  1114 ? ? ?  ? ?History   ?Chief Complaint ?Chief Complaint  ?Patient presents with  ? Hand Pain  ?  Right middle finger.   ? ? ?HPI ?Steve Salazar is a 71 y.o. male with history of type 2 diabetes, CHF with biventricular ICD, hypertension, arrhythmia, cardiomyopathy, CKD stage II, and gout.  Patient presents today for concerns about swelling, redness and pain of his right middle finger.  He says it has been like that for couple of days.  He did not injure it.  States that hurts to bend it at all.  Not associated with any fever.  Denies any cuts, scrapes, bruising.  Patient does have an IV port in the right upper arm.  He denies any pain around the site or redness or swelling.  No red streaks up the arm.  Has not taken any medicine for symptoms.  Admits to similar issues in the past when he has had gout flareups.  Reports that he gets multiple year and his last one was a couple weeks ago in his right foot.  He says he has home colchicine and prednisone to take for flareup.  Has not tried at this time.  No other complaints. ? ?HPI ? ?Past Medical History:  ?Diagnosis Date  ? CKD (chronic kidney disease), stage II   ? COVID-19 virus infection 03/2019  ? HFrEF (heart failure with reduced ejection fraction) ([place])   ? a. 2011 Echo: EF 25-30%; b. 10/2016 Echo: EF 20-25%; c. 11/2019 Echo: EF 20-25%, mild LVH, Gr2 DD, sev dil LA, mod dil RA, mod MR, mild to mod AI, mod PR, Asc Ao 106mm.  ? Hypertension   ? NICM (nonischemic cardiomyopathy) ([place])   ? a. 2011 Echo: EF 25-30%; b. 2011 MV: basal to dist inf perfusion defect->atten; c. 10/2016 Echo: EF 20-25%; d. 11/2019 Echo: EF 20-25%; e. 11/2019 MV: No ischemia. Fixed inflat defect, inflat HK, EF 17%; f. 03/2020 Cath: Nl cors. CO/CI 4.07/1.92.  ? PSVT (paroxysmal supraventricular tachycardia) ([place])   ? a. 12/2016 Event monitor: rare, short episodes of SVT.  ? ? ?Patient Active Problem List  ? Diagnosis  Date Noted  ? GERD (gastroesophageal reflux disease) 04/15/2021  ? Syncope 04/15/2021  ? VT (ventricular tachycardia) 04/15/2021  ? Biventricular ICD (implantable cardioverter-defibrillator) in place 08/23/2020  ? CHF (congestive heart failure) ([place]) 03/02/2020  ? Atrial tachycardia ([place]) 09/02/2017  ? CKD (chronic kidney disease) 07/10/2017  ? Elevated PSA 07/10/2017  ? Primary osteoarthritis of left shoulder 07/12/2016  ? BPH (benign prostatic hyperplasia) 01/13/2016  ? Family history of diabetes mellitus 09/29/2015  ? Right inguinal hernia 09/29/2015  ? Erectile dysfunction 06/01/2015  ? Chronic systolic CHF (congestive heart failure) ([place]) 08/12/2014  ? Essential hypertension 01/26/2013  ? Bradycardia 12/07/2011  ? Type 2 MI (myocardial infarction) ([place]) 07/22/2009  ? SVT/ PSVT/ PAT 07/22/2009  ? DYSPNEA 07/22/2009  ? ? ?Past Surgical History:  ?Procedure Laterality Date  ? BIV ICD INSERTION CRT-D N/A 05/13/2020  ? Procedure: BIV ICD INSERTION CRT-D;  Surgeon: Vickie Epley, MD;  Location: [place] [facility];  Service: Cardiovascular;  Laterality: N/A;  ? RIGHT/LEFT HEART CATH AND CORONARY ANGIOGRAPHY Bilateral 04/01/2020  ? Procedure: RIGHT/LEFT HEART CATH AND CORONARY ANGIOGRAPHY;  Surgeon: Minna Merritts, MD;  Location: [place] [facility];  Service: Cardiovascular;  Laterality: Bilateral;  ? ? ? ? ? ?Home Medications   ? ?  Prior to Admission medications   ?Medication Sig Start Date End Date Taking? Authorizing Provider  ?acetaminophen (TYLENOL) 500 MG tablet Take 1,000 mg by mouth every 6 (six) hours as needed for mild pain or moderate pain.   Yes [provider]  ?cephALEXin (KEFLEX) 500 MG capsule Take 1 capsule (500 mg total) by mouth 4 (four) times daily for 7 days. 07/19/21 07/26/21 Yes Laurene Footman B, PA-C  ?colchicine 0.6 MG tablet Initially take 2 tablets then one hour later take 1 tablet for a total of 3 tablets day one - Then take 1 tablet twice daily until 48 hours after symptoms  resolve for gout 05/25/21  Yes [provider]  ?diclofenac Sodium (VOLTAREN) 1 % GEL Apply topically. 05/29/21 05/29/22 Yes [provider]  ?empagliflozin (JARDIANCE) 10 MG TABS tablet Take 1 tablet by mouth daily. 07/07/21  Yes [provider]  ?famotidine (PEPCID) 20 MG tablet Take by mouth. 04/25/21  Yes [provider]  ?furosemide (LASIX) 40 MG tablet Take by mouth. 07/04/21  Yes [provider]  ?losartan (COZAAR) 25 MG tablet Take 1 tablet by mouth daily. 06/30/21 06/30/22 Yes [provider]  ?magnesium oxide (MAG-OX) 400 MG tablet Take by mouth. 04/26/21 04/26/22 Yes [provider]  ?metolazone (ZAROXOLYN) 5 MG tablet Take by mouth. 07/04/21 07/05/22 Yes [provider]  ?Multiple Vitamins-Minerals (CENTRUM ADULTS PO) Take by mouth.   Yes [provider]  ?potassium chloride SA (KLOR-CON M) 20 MEQ tablet Take 20 mEq by mouth daily. 07/16/21  Yes [provider]  ?predniSONE (DELTASONE) 20 MG tablet Take by mouth. 07/10/21 07/10/22 Yes [provider]  ?sildenafil (REVATIO) 20 MG tablet Take 1 tablet by mouth 3 (three) times daily. 06/16/21  Yes [provider]  ?spironolactone (ALDACTONE) 25 MG tablet Take 1 tablet by mouth daily. 06/30/21  Yes [provider]  ?tamsulosin (FLOMAX) 0.4 MG CAPS capsule Take 0.4 mg by mouth daily.    Yes [provider]  ?torsemide (DEMADEX) 20 MG tablet Take 2 tablets by mouth daily. 04/25/21  Yes Gollan, Kathlene November, MD  ?Karen Kays TO FIND Med Name: Continuous IV Medication for Heart. Site: Upper right arm.   Yes [provider]  ? ? ?Family History ?Family History  ?Problem Relation Age of Onset  ? Heart attack Father   ?     MI  ? ? ?Social History ?Social History  ? ?Tobacco Use  ? Smoking status: Former  ?  Types: Cigarettes  ? Smokeless tobacco: Never  ? Tobacco comments:  ?  Tobaco use-no  ?Vaping Use  ? Vaping Use: Never used  ?Substance Use Topics  ?  Alcohol use: Yes  ?  Alcohol/week: 2.0 standard drinks  ?  Types: 2 Cans of beer per week  ?  Comment: ocassional  ? Drug use: No  ? ? ? ?Allergies   ?Patient has no known allergies. ? ? ?Review of Systems ?Review of Systems  ?Constitutional:  Negative for fever.  ?Gastrointestinal:  Negative for nausea and vomiting.  ?Musculoskeletal:  Positive for arthralgias and joint swelling.  ?Skin:  Positive for color change. Negative for wound.  ?Neurological:  Negative for dizziness, weakness, numbness and headaches.  ? ? ?Physical Exam ?Triage Vital Signs ?ED Triage Vitals  ?Enc Vitals Group  ?   BP 07/19/21 1134 96/63  ?   Pulse Rate 07/19/21 1134 83  ?   Resp 07/19/21 1134 18  ?   Temp 07/19/21 1134 97.9 ?F (  36.6 ?C)  ?   Temp Source 07/19/21 1134 Oral  ?   SpO2 07/19/21 1134 99 %  ?   Weight 07/19/21 1130 187 lb (84.8 kg)  ?   Height 07/19/21 1130 5\' 11"  (1.803 m)  ?   Head Circumference --   ?   Peak Flow --   ?   Pain Score 07/19/21 1129 9  ?   Pain Loc --   ?   Pain Edu? --   ?   Excl. in [place]? --   ? ?No data found. ? ?Updated Vital Signs ?BP 96/63 (BP Location: Left Arm)   Pulse 83   Temp 97.9 ?F (36.6 ?C) (Oral)   Resp 18   Ht 5\' 11"  (1.803 m)   Wt 187 lb (84.8 kg)   SpO2 99%   BMI 26.08 kg/m?  ?   ? ?Physical Exam ?Vitals and nursing note reviewed.  ?Constitutional:   ?   General: He is not in acute distress. ?   Appearance: Normal appearance. He is well-developed. He is not ill-appearing.  ?HENT:  ?   Head: Normocephalic and atraumatic.  ?Eyes:  ?   General: No scleral icterus. ?   Conjunctiva/sclera: Conjunctivae normal.  ?Cardiovascular:  ?   Rate and Rhythm: Normal rate and regular rhythm.  ?   Pulses: Normal pulses.  ?Pulmonary:  ?   Effort: Pulmonary effort is normal. No respiratory distress.  ?   Breath sounds: Normal breath sounds.  ?Musculoskeletal:  ?   Cervical back: Neck supple.  ?   Comments: See image below.  There is swelling of the third digit of the right hand with most swelling concentrated  around the PIP joint with associated erythema and increased warmth.  Patient has tenderness throughout the entire digit but most tenderness about the PIP joint.  Difficulty bending and all of the joints due to pai

## 2021-07-19 NOTE — Discharge Instructions (Signed)
-  The swelling of your hand is most likely due to gout given your history and the fact that it is concentrated around a specific joint with redness and warmth.  However, it is also possible that it could be related to infection so would like to cover you for both possibilities. ?- I have sent an antibiotic to treat possible infection. ?- I would like you to start your colchicine and prednisone when you get home like you normally do when you have a gout attack. ?- Think should be looking better in the next 2 to 3 days but if at any point the swelling or pain worsen or you notice a red streak up your hand/arm or you start to have a fever or weakness you need to be seen again immediately in the emergency department. ?

## 2021-07-20 DIAGNOSIS — I509 Heart failure, unspecified: Secondary | ICD-10-CM | POA: Diagnosis not present

## 2021-07-22 DIAGNOSIS — I509 Heart failure, unspecified: Secondary | ICD-10-CM | POA: Diagnosis not present

## 2021-07-26 DIAGNOSIS — I509 Heart failure, unspecified: Secondary | ICD-10-CM | POA: Diagnosis not present

## 2021-07-28 DIAGNOSIS — I509 Heart failure, unspecified: Secondary | ICD-10-CM | POA: Diagnosis not present

## 2021-08-02 ENCOUNTER — Encounter: Payer: Self-pay | Admitting: Cardiology

## 2021-08-09 ENCOUNTER — Telehealth: Payer: Self-pay | Admitting: Cardiovascular Disease

## 2021-08-09 NOTE — Telephone Encounter (Signed)
Spoke with [name], per DPR. Let her know that samples for Jardiance will be left up front to be picked up.  ?

## 2021-08-09 NOTE — Telephone Encounter (Signed)
Patient calling the office for samples of medication: ? ? ?1.  What medication and dosage are you requesting samples for? ?Patient is unsure. He states during 3/06 appointment Dr. Mariah Milling mentioned providing samples, but he doesn't remember the name of the medication(s). She states this past Monday, 4/10, he was admitted at [facility] and they advised the patient to go ahead and get the samples, but again he does not remember the names of the medication(s). Is anyone able to advise on this matter and possibly provide samples? Thank you so much. ? ?2.  Are you currently out of this medication?  ? ? ? ? ? ?

## 2021-08-14 ENCOUNTER — Ambulatory Visit (INDEPENDENT_AMBULATORY_CARE_PROVIDER_SITE_OTHER): Payer: Medicare HMO

## 2021-08-14 DIAGNOSIS — I428 Other cardiomyopathies: Secondary | ICD-10-CM

## 2021-08-14 LAB — CUP PACEART REMOTE DEVICE CHECK
Battery Remaining Longevity: 72 mo
Battery Remaining Percentage: 79 %
Battery Voltage: 2.96 V
Brady Statistic AP VP Percent: 10 %
Brady Statistic AP VS Percent: 1 %
Brady Statistic AS VP Percent: 76 %
Brady Statistic AS VS Percent: 7.2 %
Brady Statistic RA Percent Paced: 3.1 %
Date Time Interrogation Session: 20230416020356
HighPow Impedance: 72 Ohm
Implantable Lead Implant Date: 20220114
Implantable Lead Implant Date: 20220114
Implantable Lead Implant Date: 20220114
Implantable Lead Location: 753858
Implantable Lead Location: 753859
Implantable Lead Location: 753860
Implantable Pulse Generator Implant Date: 20220114
Lead Channel Impedance Value: 1150 Ohm
Lead Channel Impedance Value: 400 Ohm
Lead Channel Impedance Value: 450 Ohm
Lead Channel Pacing Threshold Amplitude: 0.5 V
Lead Channel Pacing Threshold Amplitude: 1.125 V
Lead Channel Pacing Threshold Amplitude: 1.75 V
Lead Channel Pacing Threshold Pulse Width: 0.5 ms
Lead Channel Pacing Threshold Pulse Width: 0.5 ms
Lead Channel Pacing Threshold Pulse Width: 0.5 ms
Lead Channel Sensing Intrinsic Amplitude: 11.5 mV
Lead Channel Sensing Intrinsic Amplitude: 4.3 mV
Lead Channel Setting Pacing Amplitude: 2 V
Lead Channel Setting Pacing Amplitude: 2.125
Lead Channel Setting Pacing Amplitude: 2.25 V
Lead Channel Setting Pacing Pulse Width: 0.5 ms
Lead Channel Setting Pacing Pulse Width: 0.5 ms
Lead Channel Setting Sensing Sensitivity: 0.5 mV
Pulse Gen Serial Number: 111033334

## 2021-08-16 ENCOUNTER — Encounter: Payer: Self-pay | Admitting: Cardiology

## 2021-08-16 ENCOUNTER — Ambulatory Visit (INDEPENDENT_AMBULATORY_CARE_PROVIDER_SITE_OTHER): Payer: Medicare PPO | Admitting: Cardiology

## 2021-08-16 VITALS — BP 88/60 | HR 90 | Ht 71.0 in | Wt 190.0 lb

## 2021-08-16 DIAGNOSIS — I4892 Unspecified atrial flutter: Secondary | ICD-10-CM | POA: Diagnosis not present

## 2021-08-16 DIAGNOSIS — I5022 Chronic systolic (congestive) heart failure: Secondary | ICD-10-CM | POA: Diagnosis not present

## 2021-08-16 DIAGNOSIS — I471 Supraventricular tachycardia: Secondary | ICD-10-CM

## 2021-08-16 LAB — PACEMAKER DEVICE OBSERVATION

## 2021-08-16 NOTE — Patient Instructions (Addendum)
Medications: ?Your physician recommends that you continue on your current medications as directed. Please refer to the Current Medication list given to you today. ?*If you need a refill on your cardiac medications before your next appointment, please call your pharmacy* ? ?Lab Work: ?None. ?If you have labs (blood work) drawn today and your tests are completely normal, you will receive your results only by: ?MyChart Message (if you have MyChart) OR ?A paper copy in the mail ?If you have any lab test that is abnormal or we need to change your treatment, we will call you to review the results. ? ?Testing/Procedures: ?None. ? ?Follow-Up: ?At [facility], you and your health needs are our priority.  As part of our continuing mission to provide you with exceptional heart care, we have created designated Provider Care Teams.  These Care Teams include your primary Cardiologist (physician) and Advanced Practice Providers (APPs -  Physician Assistants and Nurse Practitioners) who all work together to provide you with the care you need, when you need it. ? ?Your physician wants you to follow-up in: we will contact you after Dr. Lalla Brothers has discussed with [facility].  ? ?  You will receive a reminder letter in the mail two months in advance. If you don't receive a letter, please call our office to schedule the follow-up appointment. ? ?We recommend signing up for the patient portal called "MyChart".  Sign up information is provided on this After Visit Summary.  MyChart is used to connect with patients for Virtual Visits (Telemedicine).  Patients are able to view lab/test results, encounter notes, upcoming appointments, etc.  Non-urgent messages can be sent to your provider as well.   ?To learn more about what you can do with MyChart, go to [email or web address].   ? ?Any Other Special Instructions Will Be Listed Below (If Applicable). ? ?

## 2021-08-16 NOTE — Progress Notes (Addendum)
?Electrophysiology Office Follow up Visit Note:   ? ?Date:  08/16/2021  ? ?ID:  Steve Salazar, DOB 1950-10-18, MRN [id number] ? ?PCP:  Marygrace Drought, MD  ?[facility] Cardiologist:  Ida Rogue, MD  ?[facility] Electrophysiologist:  Vickie Epley, MD  ? ? ?Interval History:   ? ?Steve Salazar is a 71 y.o. male who presents for a follow up visit. They were last seen in clinic 11/23/2020. 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 He has a biV ICD in situ.  He has had an AV nodal ablation at [facility]. ? ?He has been started on continuous infusion of IV milrinone. ? ?Since their last appointment, device interrogation showed persistent atrial flutter.  A prescription for Eliquis was sent in by his [place].   ? ?We had a long discussion today in clinic about his amiodarone use history.  He was on it in the past but was stopped for 2 reasons.  First, there was concern that he had a drug rash.  There is documentation that this rash improved with antifungal therapy.  This was in 2022.  Second, there is report of elevated liver enzymes and bilirubin while on amiodarone.   ? ?His BiV pacing percentage is low in the mid 80s due to ventricular ectopy. ? ?He is with his family today in clinic.  Had a long discussion about LVAD and continued milrinone infusion. ?  ? ?Past Medical History:  ?Diagnosis Date  ? CKD (chronic kidney disease), stage II   ? COVID-19 virus infection 03/2019  ? HFrEF (heart failure with reduced ejection fraction) ([place])   ? a. 2011 Echo: EF 25-30%; b. 10/2016 Echo: EF 20-25%; c. 11/2019 Echo: EF 20-25%, mild LVH, Gr2 DD, sev dil LA, mod dil RA, mod MR, mild to mod AI, mod PR, Asc Ao 86mm.  ? Hypertension   ? NICM (nonischemic cardiomyopathy) ([place])   ? a. 2011 Echo: EF 25-30%; b. 2011 MV: basal to dist inf perfusion defect->atten; c. 10/2016 Echo: EF 20-25%; d. 11/2019 Echo: EF 20-25%; e. 11/2019 MV: No ischemia. Fixed inflat defect, inflat HK, EF  17%; f. 03/2020 Cath: Nl cors. CO/CI 4.07/1.92.  ? PSVT (paroxysmal supraventricular tachycardia) ([place])   ? a. 12/2016 Event monitor: rare, short episodes of SVT.  ? ? ?Past Surgical History:  ?Procedure Laterality Date  ? BIV ICD INSERTION CRT-D N/A 05/13/2020  ? Procedure: BIV ICD INSERTION CRT-D;  Surgeon: Vickie Epley, MD;  Location: [place] [facility];  Service: Cardiovascular;  Laterality: N/A;  ? RIGHT/LEFT HEART CATH AND CORONARY ANGIOGRAPHY Bilateral 04/01/2020  ? Procedure: RIGHT/LEFT HEART CATH AND CORONARY ANGIOGRAPHY;  Surgeon: Minna Merritts, MD;  Location: [place] [facility];  Service: Cardiovascular;  Laterality: Bilateral;  ? ? ?Current Medications: ?Current Meds  ?Medication Sig  ? acetaminophen (TYLENOL) 500 MG tablet Take 1,000 mg by mouth every 6 (six) hours as needed for mild pain or moderate pain.  ? apixaban (ELIQUIS) 5 MG TABS tablet Take by mouth.  ? colchicine 0.6 MG tablet Initially take 2 tablets then one hour later take 1 tablet for a total of 3 tablets day one - Then take 1 tablet twice daily until 48 hours after symptoms resolve for gout  ? diclofenac Sodium (VOLTAREN) 1 % GEL Apply topically.  ? empagliflozin (JARDIANCE) 10 MG TABS tablet Take 1 tablet by mouth daily.  ? famotidine (PEPCID) 20 MG tablet Take by mouth.  ?  losartan (COZAAR) 25 MG tablet Take 1 tablet by mouth daily.  ? magnesium oxide (MAG-OX) 400 MG tablet Take by mouth.  ? metolazone (ZAROXOLYN) 5 MG tablet Take by mouth.  ? Multiple Vitamins-Minerals (CENTRUM ADULTS PO) Take by mouth.  ? potassium chloride SA (KLOR-CON M) 20 MEQ tablet Take 20 mEq by mouth daily.  ? predniSONE (DELTASONE) 20 MG tablet Take by mouth.  ? sildenafil (REVATIO) 20 MG tablet Take 1 tablet by mouth 3 (three) times daily.  ? spironolactone (ALDACTONE) 25 MG tablet Take 1 tablet by mouth daily.  ? tamsulosin (FLOMAX) 0.4 MG CAPS capsule Take 0.4 mg by mouth daily.   ? torsemide (DEMADEX) 20 MG tablet Take 2 tablets by mouth daily.  ?  UNABLE TO FIND Med Name: Continuous IV Medication for Heart. Site: Upper right arm.  ?  ? ?Allergies:   Patient has no known allergies.  ? ?Social History  ? ?Socioeconomic History  ? Marital status: Single  ?  Spouse name: Not on file  ? Number of children: Not on file  ? Years of education: Not on file  ? Highest education level: Not on file  ?Occupational History  ? Occupation: Full time  ?Tobacco Use  ? Smoking status: Former  ?  Types: Cigarettes  ? Smokeless tobacco: Never  ? Tobacco comments:  ?  Tobaco use-no  ?Vaping Use  ? Vaping Use: Never used  ?Substance and Sexual Activity  ? Alcohol use: Yes  ?  Alcohol/week: 2.0 standard drinks  ?  Types: 2 Cans of beer per week  ?  Comment: ocassional  ? Drug use: No  ? Sexual activity: Not on file  ?Other Topics Concern  ? Not on file  ?Social History Narrative  ? Pt gets regular exercise.  ? ?Social Determinants of Health  ? ?Financial Resource Strain: Not on file  ?Food Insecurity: Not on file  ?Transportation Needs: Not on file  ?Physical Activity: Not on file  ?Stress: Not on file  ?Social Connections: Not on file  ?  ? ?Family History: ?The patient's family history includes Heart attack in his father. ? ?ROS:   ?Please see the history of present illness.    ?All other systems reviewed and are negative. ? ?EKGs/Labs/Other Studies Reviewed:   ? ?The following studies were reviewed today: ? ?08/13/2021 Device interrogation ?1 NSVT, no therapy  ?AMS episodes for AF/Flutter, fastest AV 284/135.  Hx of AT, no OAC  ?AF burden 39%, ongoing from 4/15 @ 10:59  ?BiV pacing 84%  ?Next remote 91 days.  ?Route to triage per protocol  ? ? ?08/16/2021 in clinic device interrogation  ?Battery longevity 5.9 years.  BiV pacing 85%.  Lead parameter stable. ? ? ?EKG:  The ekg ordered today demonstrates BiV pacing, QRS duration 186 ms.  Atrial flutter. ? ?Recent Labs: ?08/23/2020: ALT 23; BUN 18; Creatinine, Ser 1.19; Potassium 4.6; Sodium 143; TSH 1.150  ?Recent Lipid Panel ?    ?Component Value Date/Time  ? CHOL 208 (H) 01/26/2013 0826  ? TRIG 80 01/26/2013 0826  ? HDL 63 01/26/2013 0826  ? CHOLHDL 3.3 01/26/2013 0826  ? LDLCALC 129 (H) 01/26/2013 [id number]  ? ? ?Physical Exam:   ? ?VS:  BP (!) 88/60 (BP Location: Left Arm, Patient Position: Sitting, Cuff Size: Normal)   Pulse 90   Ht 5\' 11"  (1.803 m)   Wt 190 lb (86.2 kg)   SpO2 97%   BMI 26.50 kg/m?    ? ?Wt Readings  from Last 3 Encounters:  ?08/16/21 190 lb (86.2 kg)  ?07/19/21 187 lb (84.8 kg)  ?07/03/21 190 lb 6 oz (86.4 kg)  ?  ? ?GEN:  Well nourished, well developed in no acute distress ?HEENT: Normal ?NECK: No JVD; No carotid bruits ?LYMPHATICS: No lymphadenopathy ?CARDIAC: RRR, no murmurs, rubs, gallops.  ICD pocket well-healed. ?RESPIRATORY:  Clear to auscultation without rales, wheezing or rhonchi  ?ABDOMEN: Soft, non-tender, non-distended ?MUSCULOSKELETAL:  No edema; No deformity.  PICC line exiting right upper extremity. ?SKIN: Warm and dry ?NEUROLOGIC:  Alert and oriented x 3 ?PSYCHIATRIC:  Normal affect  ? ? ? ?  ? ?ASSESSMENT:   ? ?1. Chronic systolic CHF (congestive heart failure) ([place])   ?2. Atrial tachycardia ([place])   ?3. Atrial flutter, unspecified type ([place])   ? ?PLAN:   ? ?In order of problems listed above: ? ?#Chronic systolic HF ?End-stage.  On milrinone IV continuous infusion.  [facility]AD work-up ongoing at [facility]. ?Has not responded to BiV therapy.  LV significantly dilated.  Ideally, he would be back in normal rhythm to help improve atrial kick and hopefully cardiac output.  I would like to get him cardioverted to see if this would help at all with his desire to wean from IV milrinone.  I will reach out to the [facility] team to see if they would like to do it there versus here in [place].  He is still considering LVAD therapy. ? ?#Atrial tachycardia/flutter ?On Eliquis for stroke prophylaxis.  I will reach out to the [facility] heart failure team to discuss cardioversion.  Rapid ventricular rates are no longer concern  after his AV nodal ablation but I suspect there is a decrease in cardiac output with the lack of atrial function while in flutter. ? ?#PVCs ?Contributing to poor BiV pacing percentage.  Amiodarone use limited by hi

## 2021-08-21 ENCOUNTER — Telehealth: Payer: Self-pay | Admitting: *Deleted

## 2021-08-21 NOTE — Telephone Encounter (Signed)
Spoke with [facility] nurse practitioner Zachary George.  She agrees with plan for cardioversion to help restore AV synchrony.  I will route message to our scheduler to get this arranged for him. ?  ?Lysbeth Galas T. Quentin Ore, MD, [facility], [place] ?Cardiac Electrophysiology ?

## 2021-08-22 ENCOUNTER — Encounter: Payer: Self-pay | Admitting: *Deleted

## 2021-08-22 NOTE — Telephone Encounter (Signed)
Went over instructions for Cardioversion with the patient. Verbalized agreement and understanding.  ?

## 2021-08-22 NOTE — Telephone Encounter (Signed)
Patient returned call. Transferred to RN.  ?

## 2021-08-22 NOTE — Telephone Encounter (Signed)
Cardioversion ? ?You are scheduled for a Cardioversion on Sep 18, 2021 with Dr. Tenny Craw.  Please arrive at the [facility] (Main Entrance A) at [facility]: [address] [place], [place] [address] at 9 am.  ?DIET: Nothing to eat or drink after midnight except a sip of water with medications (see medication instructions below) ? ? ?Medication Instructions: ?Hold Lasix and metolazone until afterward.  ? ?Continue your anticoagulant: Eliquis ?You will need to continue your anticoagulant after your procedure until you  are told by your  ?Provider that it is safe to stop ? ? ?You must have a responsible person to drive you home and stay in the waiting area during your procedure. Failure to do so could result in cancellation. ? ?Physiological scientist cards. ? ?*Special Note: Every effort is made to have your procedure done on time. Occasionally there are emergencies that occur at the hospital that may cause delays. Please be patient if a delay does occur.  ?

## 2021-08-24 ENCOUNTER — Telehealth: Payer: Self-pay | Admitting: Cardiovascular Disease

## 2021-08-24 NOTE — Telephone Encounter (Signed)
? ? ?  Pre-operative Risk Assessment  ?  ?Patient Name: Steve Salazar  ?DOB: 04-12-1951 ?MRN: [id number]  ? ? ? ?Request for Surgical Clearance   ? ?Procedure:   1 tooth extraction ? ?Date of Surgery:  Clearance 08/24/21                              ?   ?Surgeon:  Dr. Anette Riedel ?Surgeon's Group or Practice Name:  [facility] ?Phone number:  [phone] ?Fax number:  [phone] ?  ?Type of Clearance Requested:   ?- Medical  ?  ?Type of Anesthesia:  Local  ?  ?Additional requests/questions:   ? ?Signed, ?Angeline S Hammer   ?08/24/2021, 11:17 AM   ?

## 2021-08-24 NOTE — Telephone Encounter (Signed)
? ?  Patient Name: Steve Salazar  ?DOB: March 27, 1951 ?MRN: [id number] ? ?Primary Cardiologist: Ida Rogue, MD ? ?Chart reviewed as part of pre-operative protocol coverage.  ? ?SIMPLE EXTRACTION/CLEANINGS: Simple dental extractions (i.e. 1-2 teeth) are considered low risk procedures per guidelines and generally do not require any specific cardiac clearance. It is also generally accepted that for simple extractions and dental cleanings, there is no need to interrupt blood thinner therapy. ? ? ? ?SBE prophylaxis is not required for the patient from a cardiac standpoint. ? ?I will route this recommendation to the requesting party via Epic fax function and remove from pre-op pool. ? ?Please call with questions. ? ?Christell Faith, PA-C ?08/24/2021, 11:24 AM ? ?

## 2021-08-30 NOTE — Progress Notes (Signed)
Remote ICD transmission.   

## 2021-09-11 ENCOUNTER — Telehealth: Payer: Self-pay | Admitting: Cardiovascular Disease

## 2021-09-11 ENCOUNTER — Encounter (HOSPITAL_COMMUNITY): Payer: Self-pay | Admitting: Internal Medicine

## 2021-09-11 NOTE — Progress Notes (Signed)
Attempted to obtain medical history via telephone, unable to reach at this time. Unable to leave a voicemail to return pre surgical testing department's phone call.  ?

## 2021-09-11 NOTE — Telephone Encounter (Signed)
Patient calling the office for samples of medication:   1.  What medication and dosage are you requesting samples for?   empagliflozin (JARDIANCE) 10 MG TABS tablet     2.  Are you currently out of this medication? Yes    

## 2021-09-12 NOTE — Telephone Encounter (Signed)
Called and spoke with patient.  ? ?Patient reports that he has been out of Jardiance for about a week.  ? ?Per note from OV on 3/6: ?Recommend he discuss Farxiga/Jardiance with them.  We may have samples if he would like to try before filling a prescription ? ?Looking through chart, RX was subsequently sent in from [facility] on 3/10. During OV with [facility] on 4/10 he was advised to call our office to get some samples. Pt provided with samples from our office on 4/12.  ? ?Called yesterday afternoon requesting more samples. I called and spoke with patient today to ask if he had attempted to get his prescription filled. He stated that he had, but the cost was over $100 and the pharmacy told him that insurance needed more information from the prescribing doctor.  ? ?I advised patient that I could provide him with some samples, however we could not continue to provide him with continuous samples, as they are typically reserved for new starts. Advised that he should reach out to [facility], and see if they had gotten requests from the insurance company to see if they had gotten more information about his prescription.  ? ?Went to get samples from medication closet and found that we are out of jardiance samples. Patient called and notified.  ?

## 2021-09-18 ENCOUNTER — Encounter (HOSPITAL_COMMUNITY): Payer: Self-pay | Admitting: Anesthesiology

## 2021-09-18 ENCOUNTER — Encounter (HOSPITAL_COMMUNITY)
Admission: RE | Disposition: A | Discharge status: Home / Self Care | Payer: Self-pay | Source: Home / Self Care | Attending: Internal Medicine

## 2021-09-18 ENCOUNTER — Ambulatory Visit (HOSPITAL_COMMUNITY)
Admission: RE | Admit: 2021-09-18 | Discharge: 2021-09-18 | Disposition: A | Discharge status: Home / Self Care | Payer: Medicare PPO | Attending: Internal Medicine | Admitting: Internal Medicine

## 2021-09-18 DIAGNOSIS — Z5309 Procedure and treatment not carried out because of other contraindication: Secondary | ICD-10-CM | POA: Diagnosis not present

## 2021-09-18 DIAGNOSIS — I471 Supraventricular tachycardia: Secondary | ICD-10-CM | POA: Insufficient documentation

## 2021-09-18 SURGERY — CANCELLED PROCEDURE

## 2021-09-18 MED ORDER — SODIUM CHLORIDE 0.9 % IV SOLN: INTRAVENOUS | Status: DC

## 2021-09-18 NOTE — Anesthesia Preprocedure Evaluation (Signed)
Anesthesia Evaluation  Patient identified by MRN, date of birth, ID band Patient awake    Reviewed: Allergy & Precautions, NPO status , Patient's Chart, lab work & pertinent test results  Airway Mallampati: II  TM Distance: >3 FB Neck ROM: Full    Dental no notable dental hx.    Pulmonary neg pulmonary ROS, former smoker,    Pulmonary exam normal breath sounds clear to auscultation       Cardiovascular hypertension, + Past MI and +CHF (EF 20-25%)  Normal cardiovascular exam+ dysrhythmias Supra Ventricular Tachycardia + pacemaker + Cardiac Defibrillator  Rhythm:Regular Rate:Normal     Neuro/Psych negative neurological ROS  negative psych ROS   GI/Hepatic Neg liver ROS, GERD  ,  Endo/Other  negative endocrine ROS  Renal/GU Renal InsufficiencyRenal disease  negative genitourinary   Musculoskeletal  (+) Arthritis ,   Abdominal   Peds negative pediatric ROS (+)  Hematology negative hematology ROS (+)   Anesthesia Other Findings   Reproductive/Obstetrics negative OB ROS                             Anesthesia Physical Anesthesia Plan  ASA: 3  Anesthesia Plan: General   Post-op Pain Management: Minimal or no pain anticipated   Induction: Intravenous  PONV Risk Score and Plan: 2  Airway Management Planned:   Additional Equipment: None  Intra-op Plan:   Post-operative Plan:   Informed Consent:   Plan Discussed with: Anesthesiologist and CRNA  Anesthesia Plan Comments:         Anesthesia Quick Evaluation

## 2021-09-18 NOTE — Progress Notes (Signed)
Patient arrived for cardioversion.  Stated he ate a biscuit prior to arrival.  Cardioversion canceled, pt discharged home with significant other.  Educated patient on importance of being NPO for this procedure.  He will call office to reschedule.  Roselie Awkward, RN

## 2021-10-08 NOTE — Progress Notes (Unsigned)
Cardiology Office Note  Date:  10/09/2021   ID:  TASMAN EDELMAN, DOB 01-Dec-1950, MRN [id number]  PCP:  Marygrace Drought, MD   Chief Complaint  Patient presents with   3 month follow up     Patient c/o shortness of breath. Medications reviewed by the patient's bottles.     HPI:  Mr. Ouyang is a pleasant 71 year old gentleman with admission  07/12/10 with shortness of breath,  systolic congestive heart failure with ejection fraction 25%,  nonischemic cardiomyopathy, prior catheterization no significant coronary disease negative stress test   had covid infection 03/2019 ICD implant by Dr. Quentin Ore May 13, 2020 Syncope, VT/VF ICD shock, cardiogenic shock admission [facility] December 2022 Milrinone dependent who presents for followup of his nonischemic cardiomyopathy,ICD  Last seen in clinic by myself March 2023  Dec 2022 hospitalized at that time, notes reviewed  Followed by [facility] cardiology as well as here office visit June 05, 2021 at [facility] was started on losartan 25 daily On spironolactone 25, did not tolerate Entresto secondary to hypotension, concern for elevated creatinine  Had follow-up with [facility] cardiology April 2023 Echocardiogram August 07, 2021 ejection fraction 15% Started on Jardiance 10 mg daily, Eliquis 5 twice daily, potassium Jardiance too expensive, he has been relying on samples  Taking lasix PRN, on avg 2-3 x a week Sx of fatigue at times Creatinine stable 1.7 Trying to stay active, has not gone back to the gym Denies significant PND, orthopnea, no leg swelling  Weight stable 191 pounds  Scheduled for cardioversion end of May, this was canceled as he was not n.p.o., has not been rescheduled  As on his past office visit, he is reluctant to proceed with cardiac surgery Continues to hope he can wean off the milrinone infusion and get back to the life the way it was in the past " Need to get back to work"  EKG personally reviewed by myself on todays  visit Atrial flutter with ventricular rate 98 bpm, frequent PVCs  Other past medical history reviewed  Hospital course at [facility] 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   Device interrogated showed multiple episodes of VT/VF/SVT with many successful ATP events and one shock event for aFib on 12/17.   Lactate elevated initially to 3.2. Hs trop 166, BNP 1271.  Amiodarone started and discontinued 2/2 worsening t bili and LFTs.   Started on Dobutamine transitioning to milrinone for nonsustained VT  echo 04/15/2021 with EF of 15 to 20%  Underwent AV node ablation for aFib resulting in shocks from ICD on 04/18/2021 by Dr. Shara Blazing.   Discharged on  Milrinone infusion via R brachial PICC line that was placed on 04/22/21 at 0.2 mcg/Kg/min  Treated with Lasix 120mg  IV BID , transitioned to Toresemide 80mg  daily.  At discharge changed to torsemide 40 mg  Lost 10 pounds during his admission Notes indicating plan to  "continue holding Coreg, Entresto, sildenafil with plan of potentially placing LVAD in the upcoming weeks. " Continue home dose Aldactone.   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 1.7 initially, likely cardiorenal in origin. Manage and trend as shock is treated above.   Direct Hyperbilirubinemia  Congested hepatopathy  Abdominal ultrasound 04/18/2021 negative for acute pathology except for possible left renal cyst.  Left heart catheterization December 2021, Very difficult procedure secondary to tortuous aorta Moderately dilated aorta root, ascending aorta Nonischemic cardiomyopathy  PMH:   has a past medical history of CKD (chronic kidney disease), stage II, COVID-19 virus infection (03/2019), HFrEF (heart failure with reduced ejection fraction) ([place]),  Hypertension, NICM (nonischemic cardiomyopathy) ([place]), and PSVT (paroxysmal supraventricular tachycardia) ([place]).  PSH:    Past Surgical History:  Procedure Laterality Date   BIV ICD INSERTION CRT-D N/A 05/13/2020   Procedure: BIV ICD INSERTION CRT-D;  Surgeon: Vickie Epley, MD;  Location: [place] [facility];  Service: Cardiovascular;  Laterality: N/A;   RIGHT/LEFT HEART CATH AND CORONARY ANGIOGRAPHY Bilateral 04/01/2020   Procedure: RIGHT/LEFT HEART CATH AND CORONARY ANGIOGRAPHY;  Surgeon: Minna Merritts, MD;  Location: [place] [facility];  Service: Cardiovascular;  Laterality: Bilateral;    Current Outpatient Medications on File Prior to Visit  Medication Sig Dispense Refill   acetaminophen (TYLENOL) 500 MG tablet Take 1,000 mg by mouth every 6 (six) hours as needed for mild pain or moderate pain.     apixaban (ELIQUIS) 5 MG TABS tablet Take 5 mg by mouth 2 (two) times daily.     colchicine 0.6 MG tablet      diclofenac Sodium (VOLTAREN) 1 % GEL Apply 2 g topically daily as needed (pain).     famotidine (PEPCID) 20 MG tablet Take 20 mg by mouth daily as needed for heartburn or indigestion.     furosemide (LASIX) 40 MG tablet Take 40 mg by mouth daily as needed for fluid or edema.     losartan (COZAAR) 25 MG tablet Take 25 mg by mouth daily.     magnesium oxide (MAG-OX) 400 MG tablet Take 400 mg by mouth daily.     Multiple Vitamins-Minerals (CENTRUM ADULTS PO) Take 1 tablet by mouth daily.     potassium chloride SA (KLOR-CON M) 20 MEQ tablet Take 20 mEq by mouth daily.     predniSONE (DELTASONE) 20 MG tablet Take 20 mg by mouth daily as needed (Gout).     spironolactone (ALDACTONE) 25 MG tablet Take 25 mg by mouth daily.     tamsulosin (FLOMAX) 0.4 MG CAPS capsule Take 0.4 mg by mouth daily.      UNABLE TO FIND Med Name: Continuous IV Medication for Heart. Site: Upper right arm.     empagliflozin (JARDIANCE) 10 MG TABS tablet Take 1 tablet by mouth daily. (Patient not taking:  Reported on 09/13/2021)     No current facility-administered medications on file prior to visit.       Allergies:   Patient has no known allergies.   Social History:  The patient  reports that he has quit smoking. His smoking use included cigarettes. He has never used smokeless tobacco. He reports current alcohol use of about 2.0 standard drinks of alcohol per week. He reports that he does not use drugs.   Family History:   family history includes Heart attack in his father.    Review of Systems: Review of Systems  Constitutional: Negative.   HENT: Negative.    Respiratory: Negative.    Cardiovascular: Negative.   Gastrointestinal: Negative.   Musculoskeletal: Negative.   Psychiatric/Behavioral: Negative.    All other systems reviewed and are negative.  PHYSICAL EXAM: VS:  BP (!) 74/56 (BP Location: Right Arm, Patient Position: Sitting, Cuff Size: Normal) Comment: Digital BP machine  Ht 5\' 11"  (1.803 m)   Wt 191 lb (86.6 kg)   SpO2 98%  BMI 26.64 kg/m  , BMI Body mass index is 26.64 kg/m. Constitutional:  oriented to person, place, and time. No distress.  HENT:  Head: Grossly normal Eyes:  no discharge. No scleral icterus.  Neck: No JVD, no carotid bruits  Cardiovascular: Irregularly irregular, no murmurs appreciated Pulmonary/Chest: Clear to auscultation bilaterally, no wheezes or rails Abdominal: Soft.  no distension.  no tenderness.  Musculoskeletal: Normal range of motion Neurological:  normal muscle tone. Coordination normal. No atrophy Skin: Skin warm and dry Psychiatric: normal affect, pleasant   Recent Labs: No results found for requested labs within last 365 days.    Lipid Panel Lab Results  Component Value Date   CHOL 208 (H) 01/26/2013   HDL 63 01/26/2013   LDLCALC 129 (H) 01/26/2013   TRIG 80 01/26/2013      Wt Readings from Last 3 Encounters:  10/09/21 191 lb (86.6 kg)  08/16/21 190 lb (86.2 kg)  07/19/21 187 lb (84.8 kg)        ASSESSMENT AND PLAN:  Acute on chronic systolic CHF (congestive heart failure) (HCC) -  Remains on milrinone infusion Tolerating low-dose losartan 25 daily, Lasix 40 to 3 days/week, spironolactone 25 daily Has not been able to fill Jardiance apart from [place] assistance paperwork filled out today for [employer] Additional month of Jardiance provided today with samples Additional medication options to consider in the future include Verquvo -- We will try to contact team at [facility] to see if he is a candidate for Barostim  Atrial flutter We have sent a note to Dr. Quentin Ore to reschedule cardioversion restoring normal sinus rhythm will likely be an important part of his heart failure care.  Currently on Eliquis  Dilated cardiomyopathy Cardiac catheterization no significant disease -On milrinone infusion Medications as above, creatinine elevated 1.7 likely indicative of cardiorenal syndrome, low output failure  Nonsustained VT/VF/SVT Had AV node ablation at [facility]   Total encounter time more than 40 minutes  Greater than 50% was spent in counseling and coordination of care with the patient    No orders of the defined types were placed in this encounter.     Signed, Esmond Plants, M.D., Ph.D. 10/09/2021  [place], [place]

## 2021-10-08 NOTE — H&P (View-Only) (Signed)
Cardiology Office Note  Date:  10/09/2021   ID:  Steve Salazar, DOB 1951-03-03, MRN [id number]  PCP:  Marygrace Drought, MD   Chief Complaint  Patient presents with   3 month follow up     Patient c/o shortness of breath. Medications reviewed by the patient's bottles.     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 Quentin Ore May 13, 2020 Syncope, VT/VF ICD shock, cardiogenic shock admission [facility] December 2022 Milrinone dependent who presents for followup of his nonischemic cardiomyopathy,ICD  Last seen in clinic by myself March 2023  Dec 2022 hospitalized at that time, notes reviewed  Followed by [facility] cardiology as well as here office visit June 05, 2021 at [facility] was started on losartan 25 daily On spironolactone 25, did not tolerate Entresto secondary to hypotension, concern for elevated creatinine  Had follow-up with [facility] cardiology April 2023 Echocardiogram August 07, 2021 ejection fraction 15% Started on Jardiance 10 mg daily, Eliquis 5 twice daily, potassium Jardiance too expensive, he has been relying on samples  Taking lasix PRN, on avg 2-3 x a week Sx of fatigue at times Creatinine stable 1.7 Trying to stay active, has not gone back to the gym Denies significant PND, orthopnea, no leg swelling  Weight stable 191 pounds  Scheduled for cardioversion end of May, this was canceled as he was not n.p.o., has not been rescheduled  As on his past office visit, he is reluctant to proceed with cardiac surgery Continues to hope he can wean off the milrinone infusion and get back to the life the way it was in the past " Need to get back to work"  EKG personally reviewed by myself on todays  visit Atrial flutter with ventricular rate 98 bpm, frequent PVCs  Other past medical history reviewed  Hospital course at [facility] 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   Device interrogated showed multiple episodes of VT/VF/SVT with many successful ATP events and one shock event for aFib on 12/17.   Lactate elevated initially to 3.2. Hs trop 166, BNP 1271.  Amiodarone started and discontinued 2/2 worsening t bili and LFTs.   Started on Dobutamine transitioning to milrinone for nonsustained VT  echo 04/15/2021 with EF of 15 to 20%  Underwent AV node ablation for aFib resulting in shocks from ICD on 04/18/2021 by Dr. Shara Blazing.   Discharged on  Milrinone infusion via R brachial PICC line that was placed on 04/22/21 at 0.2 mcg/Kg/min  Treated with Lasix 120mg  IV BID , transitioned to Toresemide 80mg  daily.  At discharge changed to torsemide 40 mg  Lost 10 pounds during his admission Notes indicating plan to  "continue holding Coreg, Entresto, sildenafil with plan of potentially placing LVAD in the upcoming weeks. " Continue home dose Aldactone.   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 1.7 initially, likely cardiorenal in origin. Manage and trend as shock is treated above.   Direct Hyperbilirubinemia  Congested hepatopathy  Abdominal ultrasound 04/18/2021 negative for acute pathology except for possible left renal cyst.  Left heart catheterization December 2021, Very difficult procedure secondary to tortuous aorta Moderately dilated aorta root, ascending aorta Nonischemic cardiomyopathy  PMH:   has a past medical history of CKD (chronic kidney disease), stage II, COVID-19 virus infection (03/2019), HFrEF (heart failure with reduced ejection fraction) ([place]),  Hypertension, NICM (nonischemic cardiomyopathy) ([place]), and PSVT (paroxysmal supraventricular tachycardia) ([place]).  PSH:    Past Surgical History:  Procedure Laterality Date   BIV ICD INSERTION CRT-D N/A 05/13/2020   Procedure: BIV ICD INSERTION CRT-D;  Surgeon: Vickie Epley, MD;  Location: [place] [facility];  Service: Cardiovascular;  Laterality: N/A;   RIGHT/LEFT HEART CATH AND CORONARY ANGIOGRAPHY Bilateral 04/01/2020   Procedure: RIGHT/LEFT HEART CATH AND CORONARY ANGIOGRAPHY;  Surgeon: Minna Merritts, MD;  Location: [place] [facility];  Service: Cardiovascular;  Laterality: Bilateral;    Current Outpatient Medications on File Prior to Visit  Medication Sig Dispense Refill   acetaminophen (TYLENOL) 500 MG tablet Take 1,000 mg by mouth every 6 (six) hours as needed for mild pain or moderate pain.     apixaban (ELIQUIS) 5 MG TABS tablet Take 5 mg by mouth 2 (two) times daily.     colchicine 0.6 MG tablet      diclofenac Sodium (VOLTAREN) 1 % GEL Apply 2 g topically daily as needed (pain).     famotidine (PEPCID) 20 MG tablet Take 20 mg by mouth daily as needed for heartburn or indigestion.     furosemide (LASIX) 40 MG tablet Take 40 mg by mouth daily as needed for fluid or edema.     losartan (COZAAR) 25 MG tablet Take 25 mg by mouth daily.     magnesium oxide (MAG-OX) 400 MG tablet Take 400 mg by mouth daily.     Multiple Vitamins-Minerals (CENTRUM ADULTS PO) Take 1 tablet by mouth daily.     potassium chloride SA (KLOR-CON M) 20 MEQ tablet Take 20 mEq by mouth daily.     predniSONE (DELTASONE) 20 MG tablet Take 20 mg by mouth daily as needed (Gout).     spironolactone (ALDACTONE) 25 MG tablet Take 25 mg by mouth daily.     tamsulosin (FLOMAX) 0.4 MG CAPS capsule Take 0.4 mg by mouth daily.      UNABLE TO FIND Med Name: Continuous IV Medication for Heart. Site: Upper right arm.     empagliflozin (JARDIANCE) 10 MG TABS tablet Take 1 tablet by mouth daily. (Patient not taking:  Reported on 09/13/2021)     No current facility-administered medications on file prior to visit.       Allergies:   Patient has no known allergies.   Social History:  The patient  reports that he has quit smoking. His smoking use included cigarettes. He has never used smokeless tobacco. He reports current alcohol use of about 2.0 standard drinks of alcohol per week. He reports that he does not use drugs.   Family History:   family history includes Heart attack in his father.    Review of Systems: Review of Systems  Constitutional: Negative.   HENT: Negative.    Respiratory: Negative.    Cardiovascular: Negative.   Gastrointestinal: Negative.   Musculoskeletal: Negative.   Psychiatric/Behavioral: Negative.    All other systems reviewed and are negative.  PHYSICAL EXAM: VS:  BP (!) 74/56 (BP Location: Right Arm, Patient Position: Sitting, Cuff Size: Normal) Comment: Digital BP machine  Ht 5\' 11"  (1.803 m)   Wt 191 lb (86.6 kg)   SpO2 98%  BMI 26.64 kg/m  , BMI Body mass index is 26.64 kg/m. Constitutional:  oriented to person, place, and time. No distress.  HENT:  Head: Grossly normal Eyes:  no discharge. No scleral icterus.  Neck: No JVD, no carotid bruits  Cardiovascular: Irregularly irregular, no murmurs appreciated Pulmonary/Chest: Clear to auscultation bilaterally, no wheezes or rails Abdominal: Soft.  no distension.  no tenderness.  Musculoskeletal: Normal range of motion Neurological:  normal muscle tone. Coordination normal. No atrophy Skin: Skin warm and dry Psychiatric: normal affect, pleasant   Recent Labs: No results found for requested labs within last 365 days.    Lipid Panel Lab Results  Component Value Date   CHOL 208 (H) 01/26/2013   HDL 63 01/26/2013   LDLCALC 129 (H) 01/26/2013   TRIG 80 01/26/2013      Wt Readings from Last 3 Encounters:  10/09/21 191 lb (86.6 kg)  08/16/21 190 lb (86.2 kg)  07/19/21 187 lb (84.8 kg)        ASSESSMENT AND PLAN:  Acute on chronic systolic CHF (congestive heart failure) (HCC) -  Remains on milrinone infusion Tolerating low-dose losartan 25 daily, Lasix 40 to 3 days/week, spironolactone 25 daily Has not been able to fill Jardiance apart from [place] assistance paperwork filled out today for [employer] Additional month of Jardiance provided today with samples Additional medication options to consider in the future include Verquvo -- We will try to contact team at [facility] to see if he is a candidate for Barostim  Atrial flutter We have sent a note to Dr. Quentin Ore to reschedule cardioversion restoring normal sinus rhythm will likely be an important part of his heart failure care.  Currently on Eliquis  Dilated cardiomyopathy Cardiac catheterization no significant disease -On milrinone infusion Medications as above, creatinine elevated 1.7 likely indicative of cardiorenal syndrome, low output failure  Nonsustained VT/VF/SVT Had AV node ablation at [facility]   Total encounter time more than 40 minutes  Greater than 50% was spent in counseling and coordination of care with the patient    No orders of the defined types were placed in this encounter.     Signed, Esmond Plants, M.D., Ph.D. 10/09/2021  [place], [place]

## 2021-10-09 ENCOUNTER — Ambulatory Visit (INDEPENDENT_AMBULATORY_CARE_PROVIDER_SITE_OTHER): Payer: Medicare PPO | Admitting: Cardiovascular Disease

## 2021-10-09 ENCOUNTER — Encounter: Payer: Self-pay | Admitting: Cardiovascular Disease

## 2021-10-09 ENCOUNTER — Telehealth: Payer: Self-pay | Admitting: Emergency Medicine

## 2021-10-09 VITALS — BP 74/56 | HR 98 | Ht 71.0 in | Wt 191.0 lb

## 2021-10-09 DIAGNOSIS — I471 Supraventricular tachycardia, unspecified: Secondary | ICD-10-CM

## 2021-10-09 DIAGNOSIS — I4892 Unspecified atrial flutter: Secondary | ICD-10-CM

## 2021-10-09 DIAGNOSIS — I428 Other cardiomyopathies: Secondary | ICD-10-CM | POA: Diagnosis not present

## 2021-10-09 DIAGNOSIS — Z95810 Presence of automatic (implantable) cardiac defibrillator: Secondary | ICD-10-CM

## 2021-10-09 DIAGNOSIS — I5022 Chronic systolic (congestive) heart failure: Secondary | ICD-10-CM | POA: Diagnosis not present

## 2021-10-09 DIAGNOSIS — I4719 Other supraventricular tachycardia: Secondary | ICD-10-CM

## 2021-10-09 DIAGNOSIS — I10 Essential (primary) hypertension: Secondary | ICD-10-CM

## 2021-10-09 DIAGNOSIS — I447 Left bundle-branch block, unspecified: Secondary | ICD-10-CM

## 2021-10-09 DIAGNOSIS — N182 Chronic kidney disease, stage 2 (mild): Secondary | ICD-10-CM

## 2021-10-09 DIAGNOSIS — I420 Dilated cardiomyopathy: Secondary | ICD-10-CM

## 2021-10-09 DIAGNOSIS — I21A1 Myocardial infarction type 2: Secondary | ICD-10-CM

## 2021-10-09 MED ORDER — FAMOTIDINE 20 MG PO TABS: 20.0000 mg | ORAL_TABLET | Freq: Every day | ORAL | 3 refills | Status: AC | PRN

## 2021-10-09 NOTE — Patient Instructions (Addendum)
We have reached out to Dr. Lalla Brothers to help reschedule a cardioversion  Medication Instructions:  No changes  If you need a refill on your cardiac medications before your next appointment, please call your pharmacy.   Lab work: No new labs needed  Testing/Procedures: No new testing needed  Follow-Up: At [facility], you and your health needs are our priority.  As part of our continuing mission to provide you with exceptional heart care, we have created designated Provider Care Teams.  These Care Teams include your primary Cardiologist (physician) and Advanced Practice Providers (APPs -  Physician Assistants and Nurse Practitioners) who all work together to provide you with the care you need, when you need it.  You will need a follow up appointment in 3 months  Providers on your designated Care Team:   Nicolasa Ducking, NP Eula Listen, PA-C Cadence Fransico [place], [place]  COVID-19 Vaccine Information can be found at: [email or web address] For questions related to vaccine distribution or appointments, please email [email or web address] or call 2760969485.

## 2021-10-09 NOTE — Telephone Encounter (Signed)
Patient seen for OV.   Assisted patient in filling out [place] patient assistance application for [employer]. Application signed by patient and provider portion signed by MD.   Application faxed to [facility] and placed in filing cabinet in medication closet.   Medication Samples have been provided to the patient.  Drug name: Jardiance        Strength: 10 mg         Qty: 28   LOT: [id number]   Exp.Date: 05/2023  Dosing instructions: Take 1 tablet by mouth daily  The patient has been instructed regarding the correct time, dose, and frequency of taking this medication, including desired effects and most common side effects.   Jahaan Vanwagner 3:07 PM 10/09/2021

## 2021-10-10 ENCOUNTER — Telehealth: Payer: Self-pay | Admitting: Cardiovascular Disease

## 2021-10-10 NOTE — Telephone Encounter (Signed)
Called and spoke with patient.   Advised him that I would ensure dates are on patient application and refax if necessary.   Pt verbalized understanding and voiced appreciation for call.

## 2021-10-10 NOTE — Telephone Encounter (Signed)
Pt states that he thinks he forgot to "date" the first three pages on the Pt Assistance form that he filled out yesterday at his office visit. Pt would like to know if someone can date them since he has already signed the forms. Please advise

## 2021-10-12 ENCOUNTER — Telehealth: Payer: Self-pay | Admitting: Emergency Medicine

## 2021-10-12 NOTE — Telephone Encounter (Signed)
Incoming fax received from [facility].   Patient approved. Eligible to receive medication through program from October 10, 2021 through December 2023, unless circumstances change.

## 2021-10-12 NOTE — Telephone Encounter (Signed)
Error

## 2021-10-13 ENCOUNTER — Telehealth: Payer: Self-pay | Admitting: *Deleted

## 2021-10-13 NOTE — Telephone Encounter (Signed)
-----   Message from [name], MD sent at 10/10/2021 10:31 PM EDT ----- Please schedule DCCV. Thanks, CL  ----- Message ----- From: [name], MD Sent: 10/10/2021   1:44 PM EDT To: Sampson Goon, RN; [name], MD  Steve Salazar is interested in rescheduling cardioversion for flutter Previously canceled as he was not n.p.o. Thx TG

## 2021-10-13 NOTE — Telephone Encounter (Signed)
Confirmed with patient to move forward with scheduling. Will call back with instructions post scheduling.   You are scheduled for a Cardioversion on October 16, 2021 with Dr.Arida Please arrive at the [facility] at 6:30 am a.m. on the day of your procedure.  DIET INSTRUCTIONS:  Nothing to eat or drink after midnight except your medications with a sip of water.         Labs: morning of procedure  Medications:  YOU MAY TAKE ALL of your remaining medications with a small amount of water.  Must have a responsible person to drive you home.  Bring a current list of your medications and current insurance cards.    If you have any questions after you get home, please call the office at [phone]   Gave information to patient and wife, verbalized understanding and agreement.

## 2021-10-16 ENCOUNTER — Ambulatory Visit
Admission: RE | Admit: 2021-10-16 | Discharge: 2021-10-16 | Disposition: A | Discharge status: Home / Self Care | Payer: Medicare PPO | Attending: Cardiovascular Disease | Admitting: Cardiovascular Disease

## 2021-10-16 ENCOUNTER — Encounter: Payer: Self-pay | Admitting: Cardiovascular Disease

## 2021-10-16 ENCOUNTER — Encounter
Admission: RE | Disposition: A | Discharge status: Home / Self Care | Payer: Self-pay | Source: Home / Self Care | Attending: Cardiovascular Disease

## 2021-10-16 ENCOUNTER — Ambulatory Visit: Payer: Medicare PPO | Admitting: Anesthesiology

## 2021-10-16 ENCOUNTER — Other Ambulatory Visit: Payer: Self-pay

## 2021-10-16 DIAGNOSIS — I5022 Chronic systolic (congestive) heart failure: Secondary | ICD-10-CM

## 2021-10-16 DIAGNOSIS — K4090 Unilateral inguinal hernia, without obstruction or gangrene, not specified as recurrent: Secondary | ICD-10-CM

## 2021-10-16 DIAGNOSIS — I420 Dilated cardiomyopathy: Secondary | ICD-10-CM | POA: Insufficient documentation

## 2021-10-16 DIAGNOSIS — Z95810 Presence of automatic (implantable) cardiac defibrillator: Secondary | ICD-10-CM

## 2021-10-16 DIAGNOSIS — K219 Gastro-esophageal reflux disease without esophagitis: Secondary | ICD-10-CM | POA: Insufficient documentation

## 2021-10-16 DIAGNOSIS — I471 Supraventricular tachycardia: Secondary | ICD-10-CM | POA: Diagnosis not present

## 2021-10-16 DIAGNOSIS — R972 Elevated prostate specific antigen [PSA]: Secondary | ICD-10-CM

## 2021-10-16 DIAGNOSIS — I130 Hypertensive heart and chronic kidney disease with heart failure and stage 1 through stage 4 chronic kidney disease, or unspecified chronic kidney disease: Secondary | ICD-10-CM | POA: Diagnosis not present

## 2021-10-16 DIAGNOSIS — I5023 Acute on chronic systolic (congestive) heart failure: Secondary | ICD-10-CM | POA: Insufficient documentation

## 2021-10-16 DIAGNOSIS — Z7984 Long term (current) use of oral hypoglycemic drugs: Secondary | ICD-10-CM | POA: Insufficient documentation

## 2021-10-16 DIAGNOSIS — I472 Ventricular tachycardia, unspecified: Secondary | ICD-10-CM

## 2021-10-16 DIAGNOSIS — Z8616 Personal history of COVID-19: Secondary | ICD-10-CM | POA: Insufficient documentation

## 2021-10-16 DIAGNOSIS — I10 Essential (primary) hypertension: Secondary | ICD-10-CM

## 2021-10-16 DIAGNOSIS — Z7901 Long term (current) use of anticoagulants: Secondary | ICD-10-CM | POA: Diagnosis not present

## 2021-10-16 DIAGNOSIS — R0602 Shortness of breath: Secondary | ICD-10-CM

## 2021-10-16 DIAGNOSIS — I21A1 Myocardial infarction type 2: Secondary | ICD-10-CM

## 2021-10-16 DIAGNOSIS — Z79899 Other long term (current) drug therapy: Secondary | ICD-10-CM | POA: Diagnosis not present

## 2021-10-16 DIAGNOSIS — R001 Bradycardia, unspecified: Secondary | ICD-10-CM

## 2021-10-16 DIAGNOSIS — N182 Chronic kidney disease, stage 2 (mild): Secondary | ICD-10-CM | POA: Diagnosis not present

## 2021-10-16 DIAGNOSIS — M19012 Primary osteoarthritis, left shoulder: Secondary | ICD-10-CM

## 2021-10-16 DIAGNOSIS — I4892 Unspecified atrial flutter: Secondary | ICD-10-CM | POA: Diagnosis present

## 2021-10-16 DIAGNOSIS — Z833 Family history of diabetes mellitus: Secondary | ICD-10-CM

## 2021-10-16 HISTORY — PX: CARDIOVERSION: SHX1299

## 2021-10-16 SURGERY — CARDIOVERSION
Anesthesia: General

## 2021-10-16 MED ORDER — PROPOFOL 10 MG/ML IV BOLUS
INTRAVENOUS | Status: AC
  Filled 2021-10-16: qty 20

## 2021-10-16 MED ORDER — SODIUM CHLORIDE 0.9 % IV SOLN: INTRAVENOUS | Status: DC

## 2021-10-16 MED ORDER — PROPOFOL 10 MG/ML IV BOLUS
INTRAVENOUS | Status: DC | PRN
  Administered 2021-10-16: 50 mg via INTRAVENOUS
  Administered 2021-10-16: 20 mg via INTRAVENOUS

## 2021-10-16 NOTE — Transfer of Care (Signed)
Immediate Anesthesia Transfer of Care Note  Patient: Steve Salazar  Procedure(s) Performed: CARDIOVERSION  Patient Location: PACU and Nursing Unit  Anesthesia Type:General  Level of Consciousness: drowsy and patient cooperative  Airway & Oxygen Therapy: Patient Spontanous Breathing and Patient connected to nasal cannula oxygen  Post-op Assessment: Report given to RN and Post -op Vital signs reviewed and stable  Post vital signs: Reviewed and stable  Last Vitals:  Vitals Value Taken Time  BP 89/51 10/16/21 0751  Temp    Pulse 89 10/16/21 0747  Resp 16 10/16/21 0751  SpO2 98 % 10/16/21 0751    Last Pain:  Vitals:   10/16/21 0713  TempSrc: Oral  PainSc: 0-No pain         Complications: No notable events documented.

## 2021-10-16 NOTE — Anesthesia Postprocedure Evaluation (Signed)
Anesthesia Post Note  Patient: Steve Salazar  Procedure(s) Performed: CARDIOVERSION  Patient location during evaluation: PACU Anesthesia Type: General Level of consciousness: awake and alert, oriented and patient cooperative Pain management: pain level controlled Vital Signs Assessment: post-procedure vital signs reviewed and stable Respiratory status: spontaneous breathing, nonlabored ventilation and respiratory function stable Cardiovascular status: blood pressure returned to baseline and stable Postop Assessment: adequate PO intake Anesthetic complications: no   No notable events documented.   Last Vitals:  Vitals:   10/16/21 0755 10/16/21 0800  BP: (!) 83/63 (!) 92/53  Pulse: 73 78  Resp: 18 (!) 27  Temp:    SpO2: 97% 97%    Last Pain:  Vitals:   10/16/21 0800  TempSrc:   PainSc: 0-No pain                 Reed Breech

## 2021-10-16 NOTE — Interval H&P Note (Signed)
History and Physical Interval Note:  10/16/2021 7:58 AM  Steve Salazar  has presented today for surgery, with the diagnosis of Cardioversion    Afib.  The various methods of treatment have been discussed with the patient and family. After consideration of risks, benefits and other options for treatment, the patient has consented to  Procedure(s): CARDIOVERSION (N/A) as a surgical intervention.  The patient's history has been reviewed, patient examined, no change in status, stable for surgery.  I have reviewed the patient's chart and labs.  Questions were answered to the patient's satisfaction.     [name]

## 2021-10-16 NOTE — Anesthesia Preprocedure Evaluation (Addendum)
Anesthesia Evaluation  Patient identified by MRN, date of birth, ID band Patient awake    Reviewed: Allergy & Precautions, NPO status , Patient's Chart, lab work & pertinent test results  History of Anesthesia Complications Negative for: history of anesthetic complications  Airway Mallampati: III   Neck ROM: Full    Dental  (+) Missing, Chipped, Partial Upper   Pulmonary neg pulmonary ROS,    Pulmonary exam normal breath sounds clear to auscultation       Cardiovascular hypertension, +CHF (NICM, EF 20-25%)  + dysrhythmias (a flutter on Eliquis; PSVT) + Cardiac Defibrillator  Rhythm:Regular Rate:Normal  Recent hypotension on milrinone infusion  ECG 10/09/21: Atrial flutter with ventricular rate 98 bpm, frequent PVCs  Echo 08/07/21: 1. The left ventricle is severely dilated in size with normal wall thickness.  2. The left ventricular systolic function is severely decreased, LVEF is visually estimated at 15%.  3. There is thinning and akinesis involving the inferior and inferolateral wall(s).  4. There is moderate to severe mitral valve regurgitation.  5. The left atrium is moderately to severely dilated in size.  6. The right ventricle is mildly dilated in size.  Recommendations  * Compared to prior study 03/2021: LV function is similar. RV size and function is improved, with lower estimated RA pressures.   Myocardial perfusion 12/17/19:  Pharmacological myocardial perfusion imaging study with no significant  Ischemia Fixed inferolateral defect consistent with prior MI Hypokinesis of the inferolateral wall, EF estimated at 17% No EKG changes concerning for ischemia at peak stress or in recovery. High risk scan secondary to cardiomyopathy   Neuro/Psych negative neurological ROS     GI/Hepatic GERD  ,  Endo/Other  negative endocrine ROS  Renal/GU Renal disease (stage II CKD)     Musculoskeletal    Abdominal   Peds  Hematology negative hematology ROS (+)   Anesthesia Other Findings Cardiology note 10/09/21:  Acute on chronic systolic CHF (congestive heart failure) (HCC) -  Remains on milrinone infusion Tolerating low-dose losartan 25 daily, Lasix 40 to 3 days/week, spironolactone 25 daily Has not been able to fill Jardiance apart from samples Company assistance paperwork filled out today for [employer] Additional month of Jardiance provided today with samples Additional medication options to consider in the future include Verquvo -- We will try to contact team at [facility] to see if he is a candidate for Barostim  Atrial flutter We have sent a note to Dr. Lalla Brothers to reschedule cardioversion restoring normal sinus rhythm will likely be an important part of his heart failure care.  Currently on Eliquis  Dilated cardiomyopathy Cardiac catheterization no significant disease -On milrinone infusion Medications as above, creatinine elevated 1.7 likely indicative of cardiorenal syndrome, low output failure  Nonsustained VT/VF/SVT Had AV node ablation at [facility]  Reproductive/Obstetrics                            Anesthesia Physical Anesthesia Plan  ASA: 3  Anesthesia Plan: General   Post-op Pain Management:    Induction: Intravenous  PONV Risk Score and Plan: 2 and Propofol infusion, TIVA and Treatment may vary due to age or medical condition  Airway Management Planned: Natural Airway  Additional Equipment:   Intra-op Plan:   Post-operative Plan:   Informed Consent: I have reviewed the patients History and Physical, chart, labs and discussed the procedure including the risks, benefits and alternatives for the proposed anesthesia with the patient or authorized representative who  has indicated his/her understanding and acceptance.       Plan Discussed with: CRNA  Anesthesia Plan Comments: (LMA/GETA backup discussed.  Patient consented for risks of  anesthesia including but not limited to:  - adverse reactions to medications - damage to eyes, teeth, lips or other oral mucosa - nerve damage due to positioning  - sore throat or hoarseness - damage to heart, brain, nerves, lungs, other parts of body or loss of life  Informed patient about role of CRNA in peri- and intra-operative care.  Patient voiced understanding.)        Anesthesia Quick Evaluation

## 2021-10-16 NOTE — CV Procedure (Signed)
Cardioversion note: A standard informed consent was obtained. Timeout was performed. The pads were placed in the anterior posterior fashion. The patient was given propofol by the anesthesia team.  Successful cardioversion was performed with a 100 J. The patient converted to sinus rhythm. Pre-and post EKGs were reviewed. The patient tolerated the procedure with no immediate complications.  Recommendations: Continue same medications and follow-up in 2-3 weeks.  

## 2021-10-17 ENCOUNTER — Encounter: Payer: Self-pay | Admitting: Cardiovascular Disease

## 2021-11-03 NOTE — Progress Notes (Deleted)
Cardiology Office Note    Date:  11/03/2021   ID:  Steve Salazar, DOB October 29, 1950, MRN [id number]  PCP:  [name], MD  Cardiologist:  [name], MD  Electrophysiologist:  [name], MD   Chief Complaint: Follow-up  History of Present Illness:   Steve Salazar is a 71 y.o. male with history of HFrEF secondary to NICM on chronic milrinone infusion followed by [facility] Cardiology, Afib/flutter s/p AV nodal ablation on 04/19/2021 at [facility], PVCs/VT s/p BiV-ICD, CKD stage IIIb *** who presents for follow up of DCCV.   He has been followed by [facility].   He underwent successful DCCV on 10/16/2021 with one shock at 100 J.   ***   Labs independently reviewed: 09/2021 - potassium 4.0, BUN 29, serum creatinine 1.72, magnesium 2.1, Hgb 13.0, PLT 166, albumin 3.8, AST/ALT normal 08/2021 - proBNP 2335 04/2021 - proBNP 265 03/2021 - A1c 5.2 07/2020 - TSH normal 06/2018 - TC 183, TG 55, HDL 54, LDL 118  Past Medical History:  Diagnosis Date   CKD (chronic kidney disease), stage II    COVID-19 virus infection 03/2019   HFrEF (heart failure with reduced ejection fraction) (HCC)    a. 2011 Echo: EF 25-30%; b. 10/2016 Echo: EF 20-25%; c. 11/2019 Echo: EF 20-25%, mild LVH, Gr2 DD, sev dil LA, mod dil RA, mod MR, mild to mod AI, mod PR, Asc Ao 22mm.   Hypertension    NICM (nonischemic cardiomyopathy) (HCC)    a. 2011 Echo: EF 25-30%; b. 2011 MV: basal to dist inf perfusion defect->atten; c. 10/2016 Echo: EF 20-25%; d. 11/2019 Echo: EF 20-25%; e. 11/2019 MV: No ischemia. Fixed inflat defect, inflat HK, EF 17%; f. 03/2020 Cath: Nl cors. CO/CI 4.07/1.92.   PSVT (paroxysmal supraventricular tachycardia) (HCC)    a. 12/2016 Event monitor: rare, short episodes of SVT.    Past Surgical History:  Procedure Laterality Date   BIV ICD INSERTION CRT-D N/A 05/13/2020   Procedure: BIV ICD INSERTION CRT-D;  Surgeon: [name], MD;  Location: [facility] INVASIVE CV LAB;   Service: Cardiovascular;  Laterality: N/A;   CARDIOVERSION N/A 10/16/2021   Procedure: CARDIOVERSION;  Surgeon: Iran Ouch, MD;  Location: [facility] ORS;  Service: Cardiovascular;  Laterality: N/A;   RIGHT/LEFT HEART CATH AND CORONARY ANGIOGRAPHY Bilateral 04/01/2020   Procedure: RIGHT/LEFT HEART CATH AND CORONARY ANGIOGRAPHY;  Surgeon: [name], MD;  Location: [facility] INVASIVE CV LAB;  Service: Cardiovascular;  Laterality: Bilateral;    Current Medications: No outpatient medications have been marked as taking for the 11/06/21 encounter (Appointment) with Sondra Barges, PA-C.    Allergies:   Patient has no known allergies.   Social History   Socioeconomic History   Marital status: Single    Spouse name: Not on file   Number of children: Not on file   Years of education: Not on file   Highest education level: Not on file  Occupational History   Occupation: Full time  Tobacco Use   Smoking status: Never   Smokeless tobacco: Never   Tobacco comments:    Tobaco use-no  Vaping Use   Vaping Use: Never used  Substance and Sexual Activity   Alcohol use: Yes    Alcohol/week: 2.0 standard drinks of alcohol    Types: 2 Cans of beer per week    Comment: ocassional   Drug use: No   Sexual activity: Not on file  Other Topics Concern   Not  on file  Social History Narrative   Pt gets regular exercise.   Social Determinants of Health   Financial Resource Strain: Not on file  Food Insecurity: Not on file  Transportation Needs: Not on file  Physical Activity: Not on file  Stress: Not on file  Social Connections: Not on file     Family History:  The patient's family history includes Heart attack in his father.  ROS:   ROS   EKGs/Labs/Other Studies Reviewed:    Studies reviewed were summarized above. The additional studies were reviewed today:  2D echo 08/07/2021 [facility]): Summary    1. The left ventricle is severely dilated in size with normal wall  thickness.    2. The  left ventricular systolic function is severely decreased, LVEF is  visually estimated at 15%.    3. There is thinning and akinesis involving the inferior and inferolateral  wall(s).    4. There is moderate to severe mitral valve regurgitation.    5. The left atrium is moderately to severely dilated in size.    6. The right ventricle is mildly dilated in size.   Recommendations    * Compared to prior study 03/2021: LV function is similar. RV size and  function is improved, with lower estimated RA pressures.  __________  2D echo 04/17/2021 [facility]): Summary    1. The left ventricle is severely dilated in size with normal wall  thickness.    2. The left ventricular systolic function is severely decreased, LVEF is  visually estimated at 15-20%.    3. There is thinning and akinesis involving the inferior and inferolateral  wall(s).    4. There is moderate mitral valve regurgitation.    5. There is mild aortic regurgitation.    6. The left atrium is severely dilated in size.    7. The right ventricle is mildly to moderately dilated in size, with normal  systolic function.    8. There is mild to moderate tricuspid regurgitation.    9. There is moderate pulmonary hypertension.    10. The right atrium is severely dilated  in size.    11. The aorta at the ascending aorta is moderately dilated.    12. IVC size and inspiratory change suggest elevated right atrial pressure.  (10-20 mmHg).   __________  [facility] 04/01/2020: Coronary angiography:  Coronary dominance: Right for codominant  Left mainstem:   Large vessel that bifurcates into the LAD and left circumflex, no significant disease noted  Left anterior descending (LAD):   Large vessel that extends to the apical region, diagonal branch 2 of moderate size, no significant disease noted  Left circumflex (LCx):  Large vessel with OM branch 2, no significant disease noted  Right coronary artery (RCA):  Right dominant vessel with PL and PDA,  no significant disease noted  Left ventriculography: Aortic valve crossed for pressures, LV gram not performed secondary to contrast load and he had recent echo .  No significant aortic valve stenosis  Right heart pressures wedge 13 PA 47/17/29 RV 45/3/6 RA 1 LV 109/6/21 Ao 99/69 mean 82 Cardiac output 4.07 Cardiac index 1.92 Final Conclusions:    Recommendations:  Very difficult procedure given markedly tortuous descending aorta requiring insertion of long introducer sheath. Also with moderate dilated aortic root, ascending aorta and likely arch.  Catheters used as above,  Difficult time engaging ostium of left main given tortuosity and dilation of aorta. Nonischemic cardiomyopathy, luminal irregularities with no significant stenoses noted -High normal right  heart pressures. Very sluggish distal flow as documented on femoral artery injection prior to closure __________  2D echo 12/22/2019: 1. Left ventricular ejection fraction, by estimation, is 20 to 25%. The  left ventricle has severely decreased function. The left ventricle  demonstrates global hypokinesis. The left ventricular internal cavity size  was severely dilated. There is mild  left ventricular hypertrophy. Left ventricular diastolic parameters are  consistent with Grade II diastolic dysfunction (pseudonormalization).  Elevated left atrial pressure.   2. Right ventricular systolic function is mildly reduced. The right  ventricular size is mildly enlarged. Tricuspid regurgitation signal is  inadequate for assessing PA pressure.   3. Left atrial size was severely dilated.   4. Right atrial size was moderately dilated.   5. The mitral valve is normal in structure. Moderate mitral valve  regurgitation. No evidence of mitral stenosis.   6. The aortic valve is tricuspid. Aortic valve regurgitation is mild to  moderate. No aortic stenosis is present.   7. Pulmonic valve regurgitation is moderate.   8. Aortic dilatation  noted. There is mild dilatation of the ascending  aorta measuring 42 mm.   9. The inferior vena cava is normal in size with greater than 50%  respiratory variability, suggesting right atrial pressure of 3 mmHg. __________   [facility] 12/18/2019: Pharmacological myocardial perfusion imaging study with no significant  Ischemia Fixed inferolateral defect consistent with prior MI Hypokinesis of the inferolateral wall, EF estimated at 17% No EKG changes concerning for ischemia at peak stress or in recovery. High risk scan secondary to cardiomyopathy __________   Event monitor 12/2016: Normal sinus rhythm 3 episodes of SVT lasting <1 min Rate up to 180 bpm   No other significant arrhythmia __________   2D echo 10/2016: - Left ventricle: The cavity size was moderately dilated. Systolic    function was severely reduced. The estimated ejection fraction    was in the range of 20% to 25%. Diffuse hypokinesis. Akinesis of    the inferior myocardium. Doppler parameters are consistent with    abnormal left ventricular relaxation (grade 1 diastolic    dysfunction).  - Mitral valve: There was mild regurgitation.  - Left atrium: The atrium was normal in size.  - Right ventricle: Systolic function was normal.  - Pulmonary arteries: Systolic pressure was within the normal    range.   Impressions:   - Patient developed wide complex tachycardia, rate 116 bpm during    the study.    EKG:  EKG is ordered today.  The EKG ordered today demonstrates ***  Recent Labs: No results found for requested labs within last 365 days.  Recent Lipid Panel    Component Value Date/Time   CHOL 208 (H) 01/26/2013 0826   TRIG 80 01/26/2013 0826   HDL 63 01/26/2013 0826   CHOLHDL 3.3 01/26/2013 0826   LDLCALC 129 (H) 01/26/2013 0826    PHYSICAL EXAM:    VS:  There were no vitals taken for this visit.  BMI: There is no height or weight on file to calculate BMI.  Physical Exam  Wt Readings from Last 3  Encounters:  10/16/21 185 lb (83.9 kg)  10/09/21 191 lb (86.6 kg)  08/16/21 190 lb (86.2 kg)     ASSESSMENT & PLAN:   ***   {Are you ordering a CV Procedure (e.g. stress test, cath, DCCV, TEE, etc)?   Press F2        :[id number]     Disposition: F/u with Dr.  Gollan or an APP in ***, and EP as directed.   Medication Adjustments/Labs and Tests Ordered: Current medicines are reviewed at length with the patient today.  Concerns regarding medicines are outlined above. Medication changes, Labs and Tests ordered today are summarized above and listed in the Patient Instructions accessible in Encounters.   Signed, Christell Faith, PA-C 11/03/2021 10:10 AM     [facility] - [address] [place], [place] [address] [phone]

## 2021-11-06 ENCOUNTER — Ambulatory Visit: Payer: Medicare PPO | Admitting: Physician Assistant

## 2021-11-14 ENCOUNTER — Telehealth: Payer: Self-pay | Admitting: Cardiovascular Disease

## 2021-11-14 NOTE — Telephone Encounter (Signed)
Spoke with patient  States he does not wish to reschedule at this time and will wait to see Dr Mariah Milling 01/09/22 Nothing further needed at this time

## 2021-11-14 NOTE — Telephone Encounter (Signed)
Pt states that he is returning a call from the nurse. Please advise

## 2021-11-23 ENCOUNTER — Emergency Department: Payer: Medicare PPO

## 2021-11-23 ENCOUNTER — Emergency Department
Admission: EM | Admit: 2021-11-23 | Discharge: 2021-11-23 | Disposition: A | Discharge status: Other Acute Inpatient Hospital | Payer: Medicare PPO | Attending: Emergency Medicine | Admitting: Emergency Medicine

## 2021-11-23 ENCOUNTER — Other Ambulatory Visit: Payer: Self-pay

## 2021-11-23 ENCOUNTER — Inpatient Hospital Stay (HOSPITAL_COMMUNITY)
Admission: EM | Admit: 2021-11-23 | Discharge: 2021-12-29 | DRG: 023 | Disposition: E | Payer: Medicare PPO | Source: Other Acute Inpatient Hospital | Attending: Cardiology | Admitting: Cardiology

## 2021-11-23 DIAGNOSIS — Z823 Family history of stroke: Secondary | ICD-10-CM

## 2021-11-23 DIAGNOSIS — I4892 Unspecified atrial flutter: Secondary | ICD-10-CM | POA: Diagnosis present

## 2021-11-23 DIAGNOSIS — I340 Nonrheumatic mitral (valve) insufficiency: Secondary | ICD-10-CM | POA: Diagnosis present

## 2021-11-23 DIAGNOSIS — I639 Cerebral infarction, unspecified: Principal | ICD-10-CM | POA: Diagnosis present

## 2021-11-23 DIAGNOSIS — Z79899 Other long term (current) drug therapy: Secondary | ICD-10-CM

## 2021-11-23 DIAGNOSIS — R570 Cardiogenic shock: Secondary | ICD-10-CM

## 2021-11-23 DIAGNOSIS — Z781 Physical restraint status: Secondary | ICD-10-CM

## 2021-11-23 DIAGNOSIS — I130 Hypertensive heart and chronic kidney disease with heart failure and stage 1 through stage 4 chronic kidney disease, or unspecified chronic kidney disease: Secondary | ICD-10-CM | POA: Diagnosis present

## 2021-11-23 DIAGNOSIS — I428 Other cardiomyopathies: Secondary | ICD-10-CM | POA: Diagnosis not present

## 2021-11-23 DIAGNOSIS — Z20822 Contact with and (suspected) exposure to covid-19: Secondary | ICD-10-CM | POA: Diagnosis present

## 2021-11-23 DIAGNOSIS — Z95810 Presence of automatic (implantable) cardiac defibrillator: Secondary | ICD-10-CM

## 2021-11-23 DIAGNOSIS — G9341 Metabolic encephalopathy: Secondary | ICD-10-CM | POA: Diagnosis not present

## 2021-11-23 DIAGNOSIS — L299 Pruritus, unspecified: Secondary | ICD-10-CM | POA: Diagnosis not present

## 2021-11-23 DIAGNOSIS — I472 Ventricular tachycardia, unspecified: Secondary | ICD-10-CM | POA: Diagnosis not present

## 2021-11-23 DIAGNOSIS — I63412 Cerebral infarction due to embolism of left middle cerebral artery: Secondary | ICD-10-CM | POA: Diagnosis not present

## 2021-11-23 DIAGNOSIS — R4701 Aphasia: Secondary | ICD-10-CM | POA: Diagnosis present

## 2021-11-23 DIAGNOSIS — I420 Dilated cardiomyopathy: Secondary | ICD-10-CM | POA: Diagnosis present

## 2021-11-23 DIAGNOSIS — I272 Pulmonary hypertension, unspecified: Secondary | ICD-10-CM | POA: Diagnosis not present

## 2021-11-23 DIAGNOSIS — G9731 Intraoperative hemorrhage and hematoma of a nervous system organ or structure complicating a nervous system procedure: Secondary | ICD-10-CM | POA: Diagnosis not present

## 2021-11-23 DIAGNOSIS — N179 Acute kidney failure, unspecified: Secondary | ICD-10-CM | POA: Diagnosis not present

## 2021-11-23 DIAGNOSIS — Z7189 Other specified counseling: Secondary | ICD-10-CM | POA: Diagnosis not present

## 2021-11-23 DIAGNOSIS — I471 Supraventricular tachycardia: Secondary | ICD-10-CM | POA: Diagnosis present

## 2021-11-23 DIAGNOSIS — E119 Type 2 diabetes mellitus without complications: Secondary | ICD-10-CM | POA: Diagnosis not present

## 2021-11-23 DIAGNOSIS — I9581 Postprocedural hypotension: Secondary | ICD-10-CM | POA: Diagnosis not present

## 2021-11-23 DIAGNOSIS — Z66 Do not resuscitate: Secondary | ICD-10-CM | POA: Diagnosis not present

## 2021-11-23 DIAGNOSIS — E872 Acidosis, unspecified: Secondary | ICD-10-CM | POA: Diagnosis not present

## 2021-11-23 DIAGNOSIS — Y838 Other surgical procedures as the cause of abnormal reaction of the patient, or of later complication, without mention of misadventure at the time of the procedure: Secondary | ICD-10-CM | POA: Diagnosis not present

## 2021-11-23 DIAGNOSIS — I609 Nontraumatic subarachnoid hemorrhage, unspecified: Secondary | ICD-10-CM | POA: Diagnosis not present

## 2021-11-23 DIAGNOSIS — K7200 Acute and subacute hepatic failure without coma: Secondary | ICD-10-CM | POA: Diagnosis not present

## 2021-11-23 DIAGNOSIS — I5082 Biventricular heart failure: Secondary | ICD-10-CM | POA: Diagnosis present

## 2021-11-23 DIAGNOSIS — R414 Neurologic neglect syndrome: Secondary | ICD-10-CM | POA: Diagnosis present

## 2021-11-23 DIAGNOSIS — Z789 Other specified health status: Secondary | ICD-10-CM | POA: Diagnosis not present

## 2021-11-23 DIAGNOSIS — I5084 End stage heart failure: Secondary | ICD-10-CM | POA: Diagnosis not present

## 2021-11-23 DIAGNOSIS — Z515 Encounter for palliative care: Secondary | ICD-10-CM | POA: Diagnosis not present

## 2021-11-23 DIAGNOSIS — G9340 Encephalopathy, unspecified: Secondary | ICD-10-CM | POA: Diagnosis not present

## 2021-11-23 DIAGNOSIS — I493 Ventricular premature depolarization: Secondary | ICD-10-CM | POA: Diagnosis not present

## 2021-11-23 DIAGNOSIS — Z8249 Family history of ischemic heart disease and other diseases of the circulatory system: Secondary | ICD-10-CM

## 2021-11-23 DIAGNOSIS — R0600 Dyspnea, unspecified: Secondary | ICD-10-CM | POA: Diagnosis not present

## 2021-11-23 DIAGNOSIS — I482 Chronic atrial fibrillation, unspecified: Secondary | ICD-10-CM | POA: Diagnosis not present

## 2021-11-23 DIAGNOSIS — I5023 Acute on chronic systolic (congestive) heart failure: Secondary | ICD-10-CM | POA: Diagnosis not present

## 2021-11-23 DIAGNOSIS — Z7901 Long term (current) use of anticoagulants: Secondary | ICD-10-CM

## 2021-11-23 DIAGNOSIS — I5022 Chronic systolic (congestive) heart failure: Secondary | ICD-10-CM

## 2021-11-23 DIAGNOSIS — I4819 Other persistent atrial fibrillation: Secondary | ICD-10-CM | POA: Diagnosis present

## 2021-11-23 DIAGNOSIS — R1310 Dysphagia, unspecified: Secondary | ICD-10-CM | POA: Diagnosis not present

## 2021-11-23 DIAGNOSIS — I6389 Other cerebral infarction: Secondary | ICD-10-CM | POA: Diagnosis not present

## 2021-11-23 DIAGNOSIS — R29703 NIHSS score 3: Secondary | ICD-10-CM | POA: Diagnosis present

## 2021-11-23 DIAGNOSIS — I4891 Unspecified atrial fibrillation: Secondary | ICD-10-CM | POA: Diagnosis not present

## 2021-11-23 DIAGNOSIS — I509 Heart failure, unspecified: Secondary | ICD-10-CM | POA: Diagnosis not present

## 2021-11-23 DIAGNOSIS — E861 Hypovolemia: Secondary | ICD-10-CM | POA: Diagnosis not present

## 2021-11-23 DIAGNOSIS — I4811 Longstanding persistent atrial fibrillation: Secondary | ICD-10-CM | POA: Diagnosis not present

## 2021-11-23 DIAGNOSIS — Z91148 Patient's other noncompliance with medication regimen for other reason: Secondary | ICD-10-CM

## 2021-11-23 DIAGNOSIS — I110 Hypertensive heart disease with heart failure: Secondary | ICD-10-CM | POA: Insufficient documentation

## 2021-11-23 DIAGNOSIS — F05 Delirium due to known physiological condition: Secondary | ICD-10-CM | POA: Diagnosis not present

## 2021-11-23 DIAGNOSIS — N1832 Chronic kidney disease, stage 3b: Secondary | ICD-10-CM | POA: Diagnosis present

## 2021-11-23 DIAGNOSIS — I63512 Cerebral infarction due to unspecified occlusion or stenosis of left middle cerebral artery: Secondary | ICD-10-CM | POA: Diagnosis not present

## 2021-11-23 DIAGNOSIS — R7401 Elevation of levels of liver transaminase levels: Secondary | ICD-10-CM | POA: Diagnosis not present

## 2021-11-23 DIAGNOSIS — R4182 Altered mental status, unspecified: Secondary | ICD-10-CM | POA: Diagnosis present

## 2021-11-23 DIAGNOSIS — E871 Hypo-osmolality and hyponatremia: Secondary | ICD-10-CM | POA: Diagnosis not present

## 2021-11-23 DIAGNOSIS — I7121 Aneurysm of the ascending aorta, without rupture: Secondary | ICD-10-CM | POA: Diagnosis present

## 2021-11-23 DIAGNOSIS — Z8616 Personal history of COVID-19: Secondary | ICD-10-CM | POA: Diagnosis not present

## 2021-11-23 DIAGNOSIS — R29706 NIHSS score 6: Secondary | ICD-10-CM | POA: Diagnosis present

## 2021-11-23 DIAGNOSIS — R451 Restlessness and agitation: Secondary | ICD-10-CM | POA: Diagnosis not present

## 2021-11-23 DIAGNOSIS — I69391 Dysphagia following cerebral infarction: Secondary | ICD-10-CM

## 2021-11-23 DIAGNOSIS — I5042 Chronic combined systolic (congestive) and diastolic (congestive) heart failure: Secondary | ICD-10-CM | POA: Diagnosis not present

## 2021-11-23 DIAGNOSIS — I5041 Acute combined systolic (congestive) and diastolic (congestive) heart failure: Secondary | ICD-10-CM | POA: Diagnosis not present

## 2021-11-23 DIAGNOSIS — M109 Gout, unspecified: Secondary | ICD-10-CM | POA: Diagnosis present

## 2021-11-23 DIAGNOSIS — R29810 Facial weakness: Secondary | ICD-10-CM | POA: Diagnosis present

## 2021-11-23 LAB — CBC
HCT: 47 % (ref 39.0–52.0)
Hemoglobin: 15.2 g/dL (ref 13.0–17.0)
MCH: 28.8 pg (ref 26.0–34.0)
MCHC: 32.3 g/dL (ref 30.0–36.0)
MCV: 89 fL (ref 80.0–100.0)
Platelets: 202 10*3/uL (ref 150–400)
RBC: 5.28 MIL/uL (ref 4.22–5.81)
RDW: 14.5 % (ref 11.5–15.5)
WBC: 7.3 10*3/uL (ref 4.0–10.5)
nRBC: 0 % (ref 0.0–0.2)

## 2021-11-23 LAB — COMPREHENSIVE METABOLIC PANEL
ALT: 32 U/L (ref 0–44)
AST: 53 U/L — ABNORMAL HIGH (ref 15–41)
Albumin: 4.2 g/dL (ref 3.5–5.0)
Alkaline Phosphatase: 57 U/L (ref 38–126)
Anion gap: 12 (ref 5–15)
BUN: 35 mg/dL — ABNORMAL HIGH (ref 8–23)
CO2: 22 mmol/L (ref 22–32)
Calcium: 9.2 mg/dL (ref 8.9–10.3)
Chloride: 104 mmol/L (ref 98–111)
Creatinine, Ser: 2.1 mg/dL — ABNORMAL HIGH (ref 0.61–1.24)
GFR, Estimated: 33 mL/min — ABNORMAL LOW (ref >60)
Glucose, Bld: 160 mg/dL — ABNORMAL HIGH (ref 70–99)
Potassium: 3.9 mmol/L (ref 3.5–5.1)
Sodium: 138 mmol/L (ref 135–145)
Total Bilirubin: 3.9 mg/dL — ABNORMAL HIGH (ref 0.3–1.2)
Total Protein: 7 g/dL (ref 6.5–8.1)

## 2021-11-23 LAB — DIFFERENTIAL
Abs Immature Granulocytes: 0.03 10*3/uL (ref 0.00–0.07)
Basophils Absolute: 0.1 10*3/uL (ref 0.0–0.1)
Basophils Relative: 1 %
Eosinophils Absolute: 0 10*3/uL (ref 0.0–0.5)
Eosinophils Relative: 1 %
Immature Granulocytes: 0 %
Lymphocytes Relative: 24 %
Lymphs Abs: 1.7 10*3/uL (ref 0.7–4.0)
Monocytes Absolute: 0.8 10*3/uL (ref 0.1–1.0)
Monocytes Relative: 11 %
Neutro Abs: 4.6 10*3/uL (ref 1.7–7.7)
Neutrophils Relative %: 63 %

## 2021-11-23 LAB — SARS CORONAVIRUS 2 BY RT PCR: SARS Coronavirus 2 by RT PCR: NEGATIVE

## 2021-11-23 LAB — PROTIME-INR
INR: 1.2 (ref 0.8–1.2)
Prothrombin Time: 15 seconds (ref 11.4–15.2)

## 2021-11-23 LAB — ETHANOL: Alcohol, Ethyl (B): <10 mg/dL (ref <10)

## 2021-11-23 LAB — APTT: aPTT: 30 seconds (ref 24–36)

## 2021-11-23 MED ORDER — LACTATED RINGERS IV SOLN
INTRAVENOUS | Status: DC
Start: 2021-11-23 — End: 2021-11-23

## 2021-11-23 MED ORDER — SODIUM CHLORIDE 0.9% FLUSH
3.0000 mL | Freq: Once | INTRAVENOUS | Status: AC
  Administered 2021-11-23: 3 mL via INTRAVENOUS

## 2021-11-23 MED ORDER — ACETAMINOPHEN 650 MG RE SUPP
650.0000 mg | RECTAL | Status: DC | PRN
Start: 2021-11-23 — End: 2021-12-14

## 2021-11-23 MED ORDER — STROKE: EARLY STAGES OF RECOVERY BOOK
Freq: Once | Status: AC
Start: 2021-11-24 — End: 2021-11-24
  Filled 2021-11-23: qty 1

## 2021-11-23 MED ORDER — MILRINONE LACTATE IN DEXTROSE 20-5 MG/100ML-% IV SOLN
0.2000 ug/kg/min | INTRAVENOUS | Status: DC
Start: 2021-11-23 — End: 2021-11-23
  Filled 2021-11-23: qty 100

## 2021-11-23 MED ORDER — SENNOSIDES-DOCUSATE SODIUM 8.6-50 MG PO TABS: 1.0000 | ORAL_TABLET | Freq: Every evening | ORAL | Status: DC | PRN

## 2021-11-23 MED ORDER — SODIUM CHLORIDE 0.9 % IV SOLN: INTRAVENOUS | Status: AC

## 2021-11-23 MED ORDER — ACETAMINOPHEN 325 MG PO TABS
650.0000 mg | ORAL_TABLET | ORAL | Status: DC | PRN
Start: 2021-11-23 — End: 2021-12-14
  Administered 2021-12-03 – 2021-12-06 (×3): 650 mg via ORAL
  Filled 2021-11-23 (×3): qty 2

## 2021-11-23 MED ORDER — ORAL CARE MOUTH RINSE: 15.0000 mL | OROMUCOSAL | Status: DC | PRN

## 2021-11-23 MED ORDER — CHLORHEXIDINE GLUCONATE CLOTH 2 % EX PADS
6.0000 | MEDICATED_PAD | Freq: Every day | CUTANEOUS | Status: DC
  Administered 2021-11-24 – 2021-12-14 (×21): 6 via TOPICAL

## 2021-11-23 MED ORDER — ACETAMINOPHEN 160 MG/5ML PO SOLN
650.0000 mg | ORAL | Status: DC | PRN
Start: 2021-11-23 — End: 2021-12-14

## 2021-11-23 MED ORDER — ENOXAPARIN SODIUM 40 MG/0.4ML IJ SOSY
40.0000 mg | PREFILLED_SYRINGE | INTRAMUSCULAR | Status: DC
  Administered 2021-11-25: 40 mg via SUBCUTANEOUS
  Filled 2021-11-23: qty 0.4

## 2021-11-23 MED ORDER — IOHEXOL 350 MG/ML SOLN
100.0000 mL | Freq: Once | INTRAVENOUS | Status: AC | PRN
  Administered 2021-11-23: 100 mL via INTRAVENOUS

## 2021-11-23 NOTE — H&P (Addendum)
Admission H&P    Chief Complaint: Acute onset of aphasia  HPI: Steve Salazar is an 71 y.o. male with a PMHx of Gout, CKD2, nonischemic cardiomyopathy, HFrEF, HTN, PSVT, ICD placement and atrial flutter s/p cardioversion, on Eliquis, who presents in transfer from [facility] for close observation and possible mechanical thrombectomy of an acute left M2 occlusion. LKN was 3:40 PM on Thursday. He was initially evaluated via Teleneurology by Dr. Roda Shutters for acute onset of aphasia.   Per Dr. Warren Danes Teleneurology note: "Steve Salazar is a 71 y.o. African American male with PMH of Aflutter s/p cardioversion on Eliquis, CHF, nonischemic cardiomyopathy with EF 15% in 07/2021, VT/VF shock with ICD placed on 04/2020 presented to ED for code stroke. Per EMS, around 3:40 PM patient was with home health nurse had sudden onset speech difficulty.  No family at that time.  EMS was called, patient found to have aphasia but no motor deficit.  CT head no acute abnormality.  CTA head and neck initially nondiagnostic given bolus timing in the setting of CHF, however repeat showed left M2 occlusion.  After CT, neuro exam showed mild expressive aphasia, able to follow commands, able to repeat intermittently.  NIH score 3.  Discussed with interventional radiology, no intervention needed at this time, will transfer to [facility] for further close monitoring. I called patient's son Casimiro Needle and discussed with patient girlfriend [name].  Patient takes Eliquis every day, last dose this morning.  At baseline, he is active."  After arrival to [facility], the patient was noted by RN to have potentially worsened relative to prior documented NIHSS. On re-evaluation by Neurology attending here, NIHSS was 6, consistent with clinical worsening and VIR was called.   The patient's mRS is 2.   Past Medical History:  Diagnosis Date   CKD (chronic kidney disease), stage II    COVID-19 virus infection 03/2019   HFrEF (heart failure with reduced ejection  fraction) (HCC)    a. 2011 Echo: EF 25-30%; b. 10/2016 Echo: EF 20-25%; c. 11/2019 Echo: EF 20-25%, mild LVH, Gr2 DD, sev dil LA, mod dil RA, mod MR, mild to mod AI, mod PR, Asc Ao 12mm.   Hypertension    NICM (nonischemic cardiomyopathy) (HCC)    a. 2011 Echo: EF 25-30%; b. 2011 MV: basal to dist inf perfusion defect->atten; c. 10/2016 Echo: EF 20-25%; d. 11/2019 Echo: EF 20-25%; e. 11/2019 MV: No ischemia. Fixed inflat defect, inflat HK, EF 17%; f. 03/2020 Cath: Nl cors. CO/CI 4.07/1.92.   PSVT (paroxysmal supraventricular tachycardia) (HCC)    a. 12/2016 Event monitor: rare, short episodes of SVT.    Past Surgical History:  Procedure Laterality Date   BIV ICD INSERTION CRT-D N/A 05/13/2020   Procedure: BIV ICD INSERTION CRT-D;  Surgeon: [name], MD;  Location: [facility] INVASIVE CV LAB;  Service: Cardiovascular;  Laterality: N/A;   CARDIOVERSION N/A 10/16/2021   Procedure: CARDIOVERSION;  Surgeon: Iran Ouch, MD;  Location: [facility] ORS;  Service: Cardiovascular;  Laterality: N/A;   RIGHT/LEFT HEART CATH AND CORONARY ANGIOGRAPHY Bilateral 04/01/2020   Procedure: RIGHT/LEFT HEART CATH AND CORONARY ANGIOGRAPHY;  Surgeon: [name], MD;  Location: [facility] INVASIVE CV LAB;  Service: Cardiovascular;  Laterality: Bilateral;    Family History  Problem Relation Age of Onset   Heart attack Father        MI   Social History:  reports that he has never smoked. He has never used smokeless tobacco. He reports current alcohol use of  about 2.0 standard drinks of alcohol per week. He reports that he does not use drugs.  Allergies: No Known Allergies  Medications Prior to Admission  Medication Sig Dispense Refill   acetaminophen (TYLENOL) 500 MG tablet Take 1,000 mg by mouth every 6 (six) hours as needed for mild pain or moderate pain.     apixaban (ELIQUIS) 5 MG TABS tablet Take 5 mg by mouth 2 (two) times daily.     colchicine 0.6 MG tablet      diclofenac Sodium (VOLTAREN) 1 % GEL Apply 2 g  topically daily as needed (pain).     empagliflozin (JARDIANCE) 10 MG TABS tablet Take 1 tablet by mouth daily.     famotidine (PEPCID) 20 MG tablet Take 1 tablet (20 mg total) by mouth daily as needed for heartburn or indigestion. 90 tablet 3   furosemide (LASIX) 40 MG tablet Take 40 mg by mouth daily as needed for fluid or edema.     losartan (COZAAR) 25 MG tablet Take 25 mg by mouth daily.     magnesium oxide (MAG-OX) 400 MG tablet Take 400 mg by mouth daily.     Multiple Vitamins-Minerals (CENTRUM ADULTS PO) Take 1 tablet by mouth daily.     potassium chloride SA (KLOR-CON M) 20 MEQ tablet Take 20 mEq by mouth daily.     predniSONE (DELTASONE) 20 MG tablet Take 20 mg by mouth daily as needed (Gout).     spironolactone (ALDACTONE) 25 MG tablet Take 25 mg by mouth daily.     tamsulosin (FLOMAX) 0.4 MG CAPS capsule Take 0.4 mg by mouth daily.      UNABLE TO FIND Med Name: Continuous IV Medication for Heart. Site: Upper right arm.      ROS: Unable to obtain due to aphasia  Physical Examination: Blood pressure 110/78, pulse (!) 102, temperature 97.6 F (36.4 C), temperature source Oral, resp. rate 15, SpO2 97 %.  HEENT-  [place]/AT  Lungs - Respirations unlabored Extremities - No edema  Neurologic Examination: Mental Status: Awake and alert. Dense expressive and receptive aphasia. Speech output consists of perseverative 1-2 word responses without relation to questions asked. Will follow pantomimed commands but unable to understand speech. No evidence for neglect. Cranial Nerves: II: Tracks examiner to left and right without hesitancy. Blinks to threat bilateral temporal visual fields.   III,IV, VI: No ptosis. EOMI. VII: Smile and other facial movement symmetric VIII: Alerts to voice IX,X: No hoarseness XI: Head is midline Motor: BUE 5/5 proximally and distally BLE 5/5 proximally and distally  No pronator drift.  Sensory: Reacts to touch x 4.  Deep Tendon Reflexes: 2+ and symmetric  throughout Cerebellar: No ataxia with FNF bilaterally, which is performed by patient after it is pantomimed for him.   Gait: Deferred  NIHSS: 6   Results for orders placed or performed during the hospital encounter of December 09, 2021 (from the past 48 hour(s))  Protime-INR     Status: None   Collection Time: 12-09-21  4:17 PM  Result Value Ref Range   Prothrombin Time 15.0 11.4 - 15.2 seconds   INR 1.2 0.8 - 1.2    Comment: (NOTE) INR goal varies based on device and disease states. Performed at [facility], [address] [place], [place] [address]   APTT     Status: None   Collection Time: 12-09-2021  4:17 PM  Result Value Ref Range   aPTT 30 24 - 36 seconds    Comment: Performed at [facility] [facility], [address] [place], [place] [address]  CBC     Status: None   Collection Time: 11/13/2021  4:17 PM  Result Value Ref Range   WBC 7.3 4.0 - 10.5 K/uL   RBC 5.28 4.22 - 5.81 MIL/uL   Hemoglobin 15.2 13.0 - 17.0 g/dL   HCT 47.0 39.0 - 52.0 %   MCV 89.0 80.0 - 100.0 fL   MCH 28.8 26.0 - 34.0 pg   MCHC 32.3 30.0 - 36.0 g/dL   RDW 14.5 11.5 - 15.5 %   Platelets 202 150 - 400 K/uL   nRBC 0.0 0.0 - 0.2 %    Comment: Performed at [facility], [address] [place], [place] [address]  Differential     Status: None   Collection Time: 10/29/2021  4:17 PM  Result Value Ref Range   Neutrophils Relative % 63 %   Neutro Abs 4.6 1.7 - 7.7 K/uL   Lymphocytes Relative 24 %   Lymphs Abs 1.7 0.7 - 4.0 K/uL   Monocytes Relative 11 %   Monocytes Absolute 0.8 0.1 - 1.0 K/uL   Eosinophils Relative 1 %   Eosinophils Absolute 0.0 0.0 - 0.5 K/uL   Basophils Relative 1 %   Basophils Absolute 0.1 0.0 - 0.1 K/uL   Immature Granulocytes 0 %   Abs Immature Granulocytes 0.03 0.00 - 0.07 K/uL    Comment: Performed at [facility], [address] [place], [place] [address]  Comprehensive metabolic panel     Status: Abnormal   Collection Time: 11/02/2021  4:17 PM  Result  Value Ref Range   Sodium 138 135 - 145 mmol/L   Potassium 3.9 3.5 - 5.1 mmol/L   Chloride 104 98 - 111 mmol/L   CO2 22 22 - 32 mmol/L   Glucose, Bld 160 (H) 70 - 99 mg/dL    Comment: Glucose reference range applies only to samples taken after fasting for at least 8 hours.   BUN 35 (H) 8 - 23 mg/dL   Creatinine, Ser 2.10 (H) 0.61 - 1.24 mg/dL   Calcium 9.2 8.9 - 10.3 mg/dL   Total Protein 7.0 6.5 - 8.1 g/dL   Albumin 4.2 3.5 - 5.0 g/dL   AST 53 (H) 15 - 41 U/L   ALT 32 0 - 44 U/L   Alkaline Phosphatase 57 38 - 126 U/L   Total Bilirubin 3.9 (H) 0.3 - 1.2 mg/dL   GFR, Estimated 33 (L) >60 mL/min    Comment: (NOTE) Calculated using the CKD-EPI Creatinine Equation (2021)    Anion gap 12 5 - 15    Comment: Performed at [facility], [address] [place], [place] [address]  Ethanol     Status: None   Collection Time: 10/30/2021  4:17 PM  Result Value Ref Range   Alcohol, Ethyl (B) <10 <10 mg/dL    Comment: (NOTE) Lowest detectable limit for serum alcohol is 10 mg/dL.  For medical purposes only. Performed at [facility], [address] [place],  [address]   SARS Coronavirus 2 by RT PCR (hospital order, performed in [facility] hospital lab) *cepheid single result test* Anterior Nasal Swab     Status: None   Collection Time: 11/02/2021  5:13 PM   Specimen: Anterior Nasal Swab  Result Value Ref Range   SARS Coronavirus 2 by RT PCR NEGATIVE NEGATIVE    Comment: (NOTE) SARS-CoV-2 target nucleic acids are NOT DETECTED.  The SARS-CoV-2 RNA is  generally detectable in upper and lower respiratory specimens during the acute phase of infection. The lowest concentration of SARS-CoV-2 viral copies this assay can detect is 250 copies / mL. A negative result does not preclude SARS-CoV-2 infection and should not be used as the sole basis for treatment or other patient management decisions.  A negative result may occur with improper specimen collection / handling,  submission of specimen other than nasopharyngeal swab, presence of viral mutation(s) within the areas targeted by this assay, and inadequate number of viral copies (<250 copies / mL). A negative result must be combined with clinical observations, patient history, and epidemiological information.  Fact Sheet for Patients:   https://www.patel.info/  Fact Sheet for Healthcare Providers: https://hall.com/  This test is not yet approved or  cleared by the [place] FDA and has been authorized for detection and/or diagnosis of SARS-CoV-2 by FDA under an Emergency Use Authorization (EUA).  This EUA will remain in effect (meaning this test can be used) for the duration of the COVID-19 declaration under Section 564(b)(1) of the Act, 21 U.S.C. section 360bbb-3(b)(1), unless the authorization is terminated or revoked sooner.  Performed at [facility], [address] [place], [place] [address]    CT ANGIO HEAD NECK W WO CM (CODE STROKE)  Result Date: 11/15/2021 CLINICAL DATA:  Neuro deficit, acute, stroke suspected. EXAM: CT ANGIOGRAPHY HEAD AND NECK TECHNIQUE: Multidetector CT imaging of the head and neck was performed using the standard protocol during bolus administration of intravenous contrast. Multiplanar CT image reconstructions and MIPs were obtained to evaluate the vascular anatomy. Carotid stenosis measurements (when applicable) are obtained utilizing NASCET criteria, using the distal internal carotid diameter as the denominator. RADIATION DOSE REDUCTION: This exam was performed according to the departmental dose-optimization program which includes automated exposure control, adjustment of the mA and/or kV according to patient size and/or use of iterative reconstruction technique. CONTRAST:  162mL OMNIPAQUE IOHEXOL 350 MG/ML SOLN COMPARISON:  Noncontrast head CT performed immediately prior 11/12/2021. FINDINGS: CTA NECK FINDINGS  Examination limited by suboptimal arterial contrast enhancement. Aortic arch: Common origin of the innominate and left common carotid arteries. Atherosclerotic plaque within the visualized aortic arch and proximal major branch vessels of the neck. Streak and beam hardening artifact arising from a dense left-sided contrast bolus partially obscures the left subclavian artery. Within this limitation, there is no appreciable hemodynamically significant innominate or proximal subclavian artery stenosis. Right carotid system: CCA and ICA patent within the neck without stenosis or significant atherosclerotic disease. Left carotid system: CCA and ICA patent within the neck without stenosis. Minimal atherosclerotic plaque about the carotid bifurcation and within the proximal ICA. Vertebral arteries: The vertebral arteries appear patent within the neck. However, the vertebral arteries are poorly assessed due to small size and poor arterial contrast enhancement. Additionally, extensive venous reflux limits evaluation of the vertebral arteries within the lower neck. Skeleton: Cervical spondylosis. No acute fracture or aggressive osseous lesion. Other neck: 14 mm right thyroid lobe nodule, not meeting consensus criteria for ultrasound follow-up based on size. No cervical lymphadenopathy. Upper chest: No consolidation within the imaged lung apices. Review of the MIP images confirms the above findings CTA HEAD FINDINGS Examination limited by suboptimal arterial contrast enhancement. Anterior circulation: The intracranial internal carotid arteries are patent. Atherosclerotic plaque and sites of at least mild stenosis within the intracranial ICAs, bilaterally. The M1 middle cerebral arteries are patent. Occluded proximal left M2 MCA vessel (series 18, image 55) (series 20, image 32). No right M2  proximal branch occlusion is identified. Atherosclerotic irregularity of the M2 and more distal right MCA vessels. The anterior cerebral  arteries are patent. Atherosclerotic irregularity of both vessels which is poorly quantified due to relatively small vessel size and poor arterial contrast enhancement. Within described limitations, no intracranial aneurysm is identified. Posterior circulation: The intracranial vertebral arteries are patent. The left vertebral artery is developmentally small, but patent. The intracranial right vertebral artery is patent without definite stenosis. The basilar artery is patent. Mild atherosclerotic irregularity of the basilar artery. The posterior cerebral arteries are patent proximally without high-grade stenosis. Both posterior cerebral arteries appear predominantly fetal in origin. Venous sinuses: Within the limitations of contrast timing, no convincing thrombus. Anatomic variants: As described. Review of the MIP images confirms the above findings CTA head impression #2 called by telephone at the time of interpretation on 11/26/2021 at 5:05 pm to provider Dr. Joni Fears, Who verbally acknowledged these results. IMPRESSION: CTA neck: 1. Examination limited by poor arterial contrast enhancement. 2. The common carotid and internal carotid arteries are patent within the neck without stenosis. Minimal atherosclerotic plaque on the left. 3. The vertebral arteries appear patent within the neck. However, the vertebral arteries are poorly assessed due to small size and due to poor arterial contrast enhancement. Additionally, prominent venous reflux limits evaluation the vertebral arteries at the level of the lower neck. CTA head: 1. Examination limited by poor arterial contrast timing. 2. Occluded proximal left M2 MCA vessel. Neuro-interventional consultation is recommended. 3. Additional intracranial atherosclerotic disease, as described. Electronically Signed   By: Kellie Simmering D.O.   On: 11/20/2021 17:40   CT HEAD CODE STROKE WO CONTRAST  Result Date: 11/18/2021 CLINICAL DATA:  Code stroke.  Aphasia EXAM: CT HEAD  WITHOUT CONTRAST TECHNIQUE: Contiguous axial images were obtained from the base of the skull through the vertex without intravenous contrast. RADIATION DOSE REDUCTION: This exam was performed according to the departmental dose-optimization program which includes automated exposure control, adjustment of the mA and/or kV according to patient size and/or use of iterative reconstruction technique. COMPARISON:  None Available. FINDINGS: Brain: There is no acute intracranial hemorrhage, extra-axial fluid collection, or acute infarct. Parenchymal volume is normal. The ventricles are normal in size. Gray-white differentiation is preserved. There is no mass lesion.  There is no mass effect or midline shift. Vascular: There is calcification of the bilateral cavernous ICAs. Skull: Normal. Negative for fracture or focal lesion. Sinuses/Orbits: The imaged paranasal sinuses are clear. The imaged globes and orbits are unremarkable. Other: None. ASPECTS [facility] Stroke Program Early CT Score) - Ganglionic level infarction (caudate, lentiform nuclei, internal capsule, insula, M1-M3 cortex): 7 - Supraganglionic infarction (M4-M6 cortex): 3 Total score (0-10 with 10 being normal): 10 IMPRESSION: 1. No acute intracranial pathology. 2. ASPECTS is 10 These results were called by telephone at the time of interpretation on 11/14/2021 at 4:27 pm to provider PHILLIP STAFFORD , who verbally acknowledged these results. Electronically Signed   By: Valetta Mole M.D.   On: 11/04/2021 16:31     Assessment: 71 y.o. male with a PMHx of CKD2, nonischemic cardiomyopathy, HFrEF, HTN, PSVT, ICD placement and atrial flutter s/p cardioversion, on Eliquis, who presents in transfer from [facility] for close observation and possible mechanical thrombectomy of an acute left M2 occlusion. LKN was 3:40 PM on Thursday. He was initially evaluated via Teleneurology by Dr. Erlinda Hong for acute onset of aphasia.  - NIHSS on repeat exam after arrival is now 6, an increase from  3 as documented  on the Telestroke note while patient was at [facility] - CT head at [facility]: No acute intracranial pathology. ASPECTS is 10  - CTA neck at [facility]: Examination limited by poor arterial contrast enhancement. The common carotid and internal carotid arteries are patent within the neck without stenosis. Minimal atherosclerotic plaque on the left. The vertebral arteries appear patent within the neck. However, the vertebral arteries are poorly assessed due to small size and due to poor arterial contrast enhancement. Additionally, prominent venous reflux limits evaluation the vertebral arteries at the level of the lower neck.  - CTA head at [facility]: Examination limited by poor arterial contrast timing. Occluded proximal left M2 MCA vessel. Neuro-interventional consultation is recommended. Additional intracranial atherosclerotic disease, as noted in the body of the report above.  - Discussed worsened exam findings with Dr. Nuala Alpha of IR. The patient is a thrombectomy candidate and remains within the time window. Code IR has been activated.   Plan:  - The patient has been admitted to the Neuro ICU under the Neurology service.  - The patient is a VIR candidate given further neurological worsening since initial Teleneurology evaluation. Risks/benefits of the procedure were discussed extensively with patient's son over the telephone Caleen Jobs, [phone]), including approximately 50% chance of significant improvement relative to an approximate 10% chance of subarachnoid hemorrhage with possibility of significant worsening including death. The patient's son expressed understanding and provided informed consent to proceed with VIR. All questions answered. Consent witnessed by Moshe Salisbury, RN.  - Code IR called via [facility]. Being sent to the IR suite emergently.  - Post-IR order set to include frequent neuro checks and BP management.  - No antiplatelet medications or anticoagulants for at least 24 hours following  tPA.  - DVT prophylaxis with SCDs.  - Statin.  - Due to the patient's atrial flutter/a-fib, he will need to be restarted on anticoagulation when stable and when patient is determined to be at lower risk of hemorrhagic conversion of the stroke. Can consider if follow up CT at 24 hours post-IR is negative for hemorrhagic conversion. - Repeat TTE   - Has ICD. If MRI compatible, then obtain MRI brain  - PT/OT/Speech.  - NPO until passes swallow evaluation.  - Telemetry monitoring - Fasting lipid panel, HgbA1c - Cardiology has been consulted for assistance with milrinone dosing and to assist with daily monitoring of his severe CHF with EF 15%.  - SSI  60 minutes spent in the emergent neurological evaluation and management of this critically ill patient.   Electronically signed: Dr. Caryl Pina 11/22/2021, 11:31 PM

## 2021-11-23 NOTE — ED Notes (Signed)
Ruby from [facility] called with a bed assignment [place] [address] call report to [phone]

## 2021-11-23 NOTE — ED Notes (Signed)
EMTALA reviewed by this RN and transfer consent signed.

## 2021-11-23 NOTE — ED Triage Notes (Signed)
Pt coming from home via [facility]. Per EMS, pt's home health nurse called out due to pt having AMS and aphasia. [name] LNK was approx. 1540. Pt has not hx of stroke. Pt has a hx of CHF.

## 2021-11-23 NOTE — ED Notes (Signed)
[facility] present at the bedside for report of the patient - Milrinone medication & transfer paperwork given the transport team.

## 2021-11-23 NOTE — Progress Notes (Signed)
Telestroke RN note  [phone]: Code stroke activated via cart. Pre alert for pt arriving via EMS.  1616: Pt being evaluated by EDP. LWK 1540.  1617: Pt taken to CT.   1621: Dr. Roda Shutters present virtually to evaluate patient. MRS 0.   1716: Call ended.

## 2021-11-23 NOTE — Consult Note (Signed)
Stroke Neurology Consultation Note  Consult Requested by: Dr. Joni Fears  Reason for Consult: code stroke  Consult Date: 11/21/2021   The history was obtained from the RN.  During history and examination, all items were not able to obtain unless otherwise noted.  History of Present Illness:  Steve Salazar is a 71 y.o. 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  No family at that time.  EMS was called, patient found to have aphasia but no motor deficit.  CT head no acute abnormality.  CTA head and neck initially nondiagnostic given bolus timing in the setting of CHF, however repeat showed left M2 occlusion.  After CT, neuro exam showed mild expressive aphasia, able to follow commands, able to repeat intermittently.  NIH score 3.  Discussed with interventional radiology, no intervention needed at this time, will transfer to [facility] for further close monitoring.  I called patient's son Legrand Como and discussed with patient girlfriend Helene Kelp.  Patient takes Eliquis every day, last dose this morning.  At baseline, he is active.   LSN: 3:40 PM tPA Given: No: On Eliquis IR: no, low NIH =3, left M2 occlusion, LVO with mild stroke, Endi score = 1, will do close monitoring mRS = 2  Past Medical History:  Diagnosis Date   CKD (chronic kidney disease), stage II    COVID-19 virus infection 03/2019   HFrEF (heart failure with reduced ejection fraction) ([place])    a. 2011 Echo: EF 25-30%; b. 10/2016 Echo: EF 20-25%; c. 11/2019 Echo: EF 20-25%, mild LVH, Gr2 DD, sev dil LA, mod dil RA, mod MR, mild to mod AI, mod PR, Asc Ao 52mm.   Hypertension    NICM (nonischemic cardiomyopathy) ([place])    a. 2011 Echo: EF 25-30%; b. 2011 MV: basal to dist inf perfusion  defect->atten; c. 10/2016 Echo: EF 20-25%; d. 11/2019 Echo: EF 20-25%; e. 11/2019 MV: No ischemia. Fixed inflat defect, inflat HK, EF 17%; f. 03/2020 Cath: Nl cors. CO/CI 4.07/1.92.   PSVT (paroxysmal supraventricular tachycardia) ([place])    a. 12/2016 Event monitor: rare, short episodes of SVT.    Past Surgical History:  Procedure Laterality Date   BIV ICD INSERTION CRT-D N/A 05/13/2020   Procedure: BIV ICD INSERTION CRT-D;  Surgeon: Vickie Epley, MD;  Location: [place] [facility];  Service: Cardiovascular;  Laterality: N/A;   CARDIOVERSION N/A 10/16/2021   Procedure: CARDIOVERSION;  Surgeon: Wellington Hampshire, MD;  Location: [facility];  Service: Cardiovascular;  Laterality: N/A;   RIGHT/LEFT HEART CATH AND CORONARY ANGIOGRAPHY Bilateral 04/01/2020   Procedure: RIGHT/LEFT HEART CATH AND CORONARY ANGIOGRAPHY;  Surgeon: Minna Merritts, MD;  Location: [place] [facility];  Service: Cardiovascular;  Laterality: Bilateral;    Family History  Problem Relation Age of Onset   Heart attack Father        MI    Social History:  reports that he has never smoked. He has never used smokeless tobacco. He reports current alcohol use of about 2.0 standard drinks of alcohol per week. He reports that he does not use drugs.  Allergies: No Known Allergies  No current facility-administered medications on file prior to encounter.   Current Outpatient Medications on File Prior to Encounter  Medication Sig Dispense Refill  acetaminophen (TYLENOL) 500 MG tablet Take 1,000 mg by mouth every 6 (six) hours as needed for mild pain or moderate pain.     apixaban (ELIQUIS) 5 MG TABS tablet Take 5 mg by mouth 2 (two) times daily.     colchicine 0.6 MG tablet      diclofenac Sodium (VOLTAREN) 1 % GEL Apply 2 g topically daily as needed (pain).     empagliflozin (JARDIANCE) 10 MG TABS tablet Take 1 tablet by mouth daily.     famotidine (PEPCID) 20 MG tablet Take 1 tablet (20 mg total) by mouth daily as needed for  heartburn or indigestion. 90 tablet 3   furosemide (LASIX) 40 MG tablet Take 40 mg by mouth daily as needed for fluid or edema.     losartan (COZAAR) 25 MG tablet Take 25 mg by mouth daily.     magnesium oxide (MAG-OX) 400 MG tablet Take 400 mg by mouth daily.     Multiple Vitamins-Minerals (CENTRUM ADULTS PO) Take 1 tablet by mouth daily.     potassium chloride SA (KLOR-CON M) 20 MEQ tablet Take 20 mEq by mouth daily.     predniSONE (DELTASONE) 20 MG tablet Take 20 mg by mouth daily as needed (Gout).     spironolactone (ALDACTONE) 25 MG tablet Take 25 mg by mouth daily.     tamsulosin (FLOMAX) 0.4 MG CAPS capsule Take 0.4 mg by mouth daily.      UNABLE TO FIND Med Name: Continuous IV Medication for Heart. Site: Upper right arm.      Review of Systems: A full ROS was attempted today and was not able to be performed due to aphasia.  Physical Examination after CT Temp:  [98 F (36.7 C)] 98 F (36.7 C) (07/27 1652) Pulse Rate:  [85] 85 (07/27 1652) Resp:  [19] 19 (07/27 1652) BP: (108)/(72) 108/72 (07/27 1652) SpO2:  [95 %] 95 % (07/27 1652)  General - well nourished, well developed, in no apparent distress.    Ophthalmologic - fundi not visualized due to noncooperation.    Cardiovascular - regular rhythm and rate  Neuro - awake, alert, eyes open, orientated to age, but not able to answer other orientation questions.  Limited language output, intermittently repeat simple sentences, not able to name, but able to following simple commands. No gaze palsy, tracking bilaterally, blinking to visual threat bilaterally. No facial droop. Tongue midline. Bilateral UEs 5/5, no drift. Bilaterally LEs 5/5, no drift. Sensation not very cooperative, b/l FTN and HTS intact, gait not tested.    NIH Stroke Scale  Level Of Consciousness 0=Alert; keenly responsive 1=Arouse to minor stimulation 2=Requires repeated stimulation to arouse or movements to pain 3=postures or unresponsive 0  LOC Questions to  Month and Age 45=Answers both questions correctly 1=Answers one question correctly or dysarthria/intubated/trauma/language barrier 2=Answers neither question correctly or aphasia 1  LOC Commands      -Open/Close eyes     -Open/close grip     -Pantomime commands if communication barrier 0=Performs both tasks correctly 1=Performs one task correctly 2=Performs neighter task correctly 0  Best Gaze     -Only assess horizontal gaze 0=Normal 1=Partial gaze palsy 2=Forced deviation, or total gaze paresis 0  Visual 0=No visual loss 1=Partial hemianopia 2=Complete hemianopia 3=Bilateral hemianopia (blind including cortical blindness) 0  Facial Palsy     -Use grimace if obtunded 0=Normal symmetrical movement 1=Minor paralysis (asymmetry) 2=Partial paralysis (lower face) 3=Complete paralysis (upper and lower face) 0  Motor  0=No drift for  10/5 seconds 1=Drift, but does not hit bed 2=Some antigravity effort, hits  bed 3=No effort against gravity, limb falls 4=No movement 0=Amputation/joint fusion Right Arm 0     Leg 0    Left Arm 0     Leg 0  Limb Ataxia     - FNT/HTS 0=Absent or does not understand or paralyzed or amputation/joint fusion 1=Present in one limb 2=Present in two limbs 0  Sensory 0=Normal 1=Mild to moderate sensory loss 2=Severe to total sensory loss or coma/unresponsive 0  Best Language 0=No aphasia, normal 1=Mild to moderate aphasia 2=Severe aphasia 3=Mute, global aphasia, or coma/unresponsive 1  Dysarthria 0=Normal 1=Mild to moderate 2=Severe, unintelligible or mute/anarthric 0=intubated/unable to test 0  Extinction/Neglect 0=No abnormality 1=visual/tactile/auditory/spatia/personal inattention/Extinction to bilateral simultaneous stimulation 2=Profound neglect/extinction more than 1 modality  1  Total   3     Data Reviewed: CT ANGIO HEAD NECK W WO CM (CODE STROKE)  Result Date: 11/13/2021 CLINICAL DATA:  Neuro deficit, acute, stroke suspected. EXAM: CT  ANGIOGRAPHY HEAD AND NECK TECHNIQUE: Multidetector CT imaging of the head and neck was performed using the standard protocol during bolus administration of intravenous contrast. Multiplanar CT image reconstructions and MIPs were obtained to evaluate the vascular anatomy. Carotid stenosis measurements (when applicable) are obtained utilizing NASCET criteria, using the distal internal carotid diameter as the denominator. RADIATION DOSE REDUCTION: This exam was performed according to the departmental dose-optimization program which includes automated exposure control, adjustment of the mA and/or kV according to patient size and/or use of iterative reconstruction technique. CONTRAST:  167mL OMNIPAQUE IOHEXOL 350 MG/ML SOLN COMPARISON:  Noncontrast head CT performed immediately prior 11/22/2021. FINDINGS: CTA NECK FINDINGS Examination limited by suboptimal arterial contrast enhancement. Aortic arch: Common origin of the innominate and left common carotid arteries. Atherosclerotic plaque within the visualized aortic arch and proximal major branch vessels of the neck. Streak and beam hardening artifact arising from a dense left-sided contrast bolus partially obscures the left subclavian artery. Within this limitation, there is no appreciable hemodynamically significant innominate or proximal subclavian artery stenosis. Right carotid system: CCA and ICA patent within the neck without stenosis or significant atherosclerotic disease. Left carotid system: CCA and ICA patent within the neck without stenosis. Minimal atherosclerotic plaque about the carotid bifurcation and within the proximal ICA. Vertebral arteries: The vertebral arteries appear patent within the neck. However, the vertebral arteries are poorly assessed due to small size and poor arterial contrast enhancement. Additionally, extensive venous reflux limits evaluation of the vertebral arteries within the lower neck. Skeleton: Cervical spondylosis. No acute  fracture or aggressive osseous lesion. Other neck: 14 mm right thyroid lobe nodule, not meeting consensus criteria for ultrasound follow-up based on size. No cervical lymphadenopathy. Upper chest: No consolidation within the imaged lung apices. Review of the MIP images confirms the above findings CTA HEAD FINDINGS Examination limited by suboptimal arterial contrast enhancement. Anterior circulation: The intracranial internal carotid arteries are patent. Atherosclerotic plaque and sites of at least mild stenosis within the intracranial ICAs, bilaterally. The M1 middle cerebral arteries are patent. Occluded proximal left M2 MCA vessel (series 18, image 55) (series 20, image 32). No right M2 proximal branch occlusion is identified. Atherosclerotic irregularity of the M2 and more distal right MCA vessels. The anterior cerebral arteries are patent. Atherosclerotic irregularity of both vessels which is poorly quantified due to relatively small vessel size and poor arterial contrast enhancement. Within described limitations, no intracranial aneurysm is identified. Posterior circulation: The intracranial vertebral arteries are patent. The left  vertebral artery is developmentally small, but patent. The intracranial right vertebral artery is patent without definite stenosis. The basilar artery is patent. Mild atherosclerotic irregularity of the basilar artery. The posterior cerebral arteries are patent proximally without high-grade stenosis. Both posterior cerebral arteries appear predominantly fetal in origin. Venous sinuses: Within the limitations of contrast timing, no convincing thrombus. Anatomic variants: As described. Review of the MIP images confirms the above findings CTA head impression #2 called by telephone at the time of interpretation on 11/22/2021 at 5:05 pm to provider Dr. Joni Fears, Who verbally acknowledged these results. IMPRESSION: CTA neck: 1. Examination limited by poor arterial contrast enhancement. 2.  The common carotid and internal carotid arteries are patent within the neck without stenosis. Minimal atherosclerotic plaque on the left. 3. The vertebral arteries appear patent within the neck. However, the vertebral arteries are poorly assessed due to small size and due to poor arterial contrast enhancement. Additionally, prominent venous reflux limits evaluation the vertebral arteries at the level of the lower neck. CTA head: 1. Examination limited by poor arterial contrast timing. 2. Occluded proximal left M2 MCA vessel. Neuro-interventional consultation is recommended. 3. Additional intracranial atherosclerotic disease, as described. Electronically Signed   By: Kellie Simmering D.O.   On: 11/14/2021 17:40   CT HEAD CODE STROKE WO CONTRAST  Result Date: 11/08/2021 CLINICAL DATA:  Code stroke.  Aphasia EXAM: CT HEAD WITHOUT CONTRAST TECHNIQUE: Contiguous axial images were obtained from the base of the skull through the vertex without intravenous contrast. RADIATION DOSE REDUCTION: This exam was performed according to the departmental dose-optimization program which includes automated exposure control, adjustment of the mA and/or kV according to patient size and/or use of iterative reconstruction technique. COMPARISON:  None Available. FINDINGS: Brain: There is no acute intracranial hemorrhage, extra-axial fluid collection, or acute infarct. Parenchymal volume is normal. The ventricles are normal in size. Gray-white differentiation is preserved. There is no mass lesion.  There is no mass effect or midline shift. Vascular: There is calcification of the bilateral cavernous ICAs. Skull: Normal. Negative for fracture or focal lesion. Sinuses/Orbits: The imaged paranasal sinuses are clear. The imaged globes and orbits are unremarkable. Other: None. ASPECTS [facility] Stroke Program Early CT Score) - Ganglionic level infarction (caudate, lentiform nuclei, internal capsule, insula, M1-M3 cortex): 7 - Supraganglionic  infarction (M4-M6 cortex): 3 Total score (0-10 with 10 being normal): 10 IMPRESSION: 1. No acute intracranial pathology. 2. ASPECTS is 10 These results were called by telephone at the time of interpretation on 11/09/2021 at 4:27 pm to provider PHILLIP STAFFORD , who verbally acknowledged these results. Electronically Signed   By: Valetta Mole M.D.   On: 11/08/2021 16:31    Assessment: 71 y.o. 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  Time onset 3:40 PM.  NIHSS =3 after CT. CT head no acute abnormality.  CTA head and neck initially nondiagnostic given bolus timing in the setting of CHF, however repeat showed left M2 occlusion.  Not a TNK candidate given on Eliquis.  Discussed with interventional radiology, currently LVO with mild stroke,  Endi score = 1, no intervention needed at this time, will transfer to [facility] for further close monitoring.  Stroke Risk Factors - atrial fibrillation and cardiomyopathy  Plan: [facility] ICU for close neuro monitoring.  If neuro decline, may repeat CT head and neck  and consider mechanical thrombectomy if needed. Continue further stroke work up  Frequent neuro checks Telemetry monitoring MRI brain and MRA head if AICD compatible with MRI Echocardiogram  UDS, fasting lipid panel and HgbA1C PT/OT/speech consult Permissive hypertension (only treat if BP > 180/105 given CHF) for 24-48 hours post stroke onset GI and DVT prophylaxis  Had Eliquis this morning, hold off anticoagulation for now, if MRI no large infarct, can resume Eliquis. Stroke risk factor modification Further evaluation as per inpatient neurology recommendations  Discussed with Dr. Scotty Court ED physician We will follow up at [facility]   Thank you for this consultation and allowing [place] to participate in the care of this  patient.  I had long discussion with girlfriend [name] at bedside and son Casimiro Needle over the phone, updated pt current condition, treatment plan and potential prognosis, and answered all the questions.  They expressed understanding and appreciation.   Consult Participants: RN, girlfriend, patient, stroke response RN, me Location of the provider: [facility] Location of the patient: [facility]  Time Code Stroke Page received: 419 Time neurologist arrived: 421 Time NIHSS completed: 451 after CT  This consult was provided via telemedicine with 2-way video and audio communication. The patient/family was informed that care would be provided in this way and agreed to receive care in this manner.   This patient is receiving care for possible acute neurological changes. There was 75 minutes of care by this provider at the time of service, including time for direct evaluation via telemedicine, review of medical records, imaging studies and discussion of findings with providers, the patient and/or family.  Marvel Plan, MD PhD Stroke Neurology December 12, 2021 5:58 PM

## 2021-11-23 NOTE — ED Notes (Signed)
CBG upon arrival in ED was 117. Labs obtained and patient taken to CT1.

## 2021-11-23 NOTE — ED Provider Notes (Signed)
[facility] Provider Note    Event Date/Time   First MD Initiated Contact with Patient 10/30/2021 1616     (approximate)   History   Chief Complaint: Altered mental status  HPI  Steve Salazar is a 71 y.o. male with a history of hypertension diabetes systolic heart failure with an EF of 30% pulmonary hypertension with ICD implantation, on continuous milrinone infusion who comes to the ED by EMS today due to altered mental status.  Patient has a visiting home nurse who is with him at symptom onset which was acute at 3:45 PM today, characterized by inability to speak primarily.  No trauma.  No seizure.  On arrival, patient is clearly aphasic, does not understand what is said to him and is not able to communicate, only responds "sounds good."     Physical Exam   Triage Vital Signs: ED Triage Vitals  Enc Vitals Group     BP      Pulse      Resp      Temp      Temp src      SpO2      Weight      Height      Head Circumference      Peak Flow      Pain Score      Pain Loc      Pain Edu?      Excl. in GC?     Most recent vital signs: Vitals:   11/12/2021 1652  BP: 108/72  Pulse: 85  Resp: 19  Temp: 98 F (36.7 C)  SpO2: 95%    General: Awake, no distress.  CV:  Good peripheral perfusion.  Irregularly irregular rhythm, normal rate with a heart rate of 80 Resp:  Normal effort.  Clear to auscultation bilaterally Abd:  No distention.  Soft nontender Other:  Aphasia.  Mild left facial droop.  Not oriented.  No drift.  NIH stroke scale 3   ED Results / Procedures / Treatments   Labs (all labs ordered are listed, but only abnormal results are displayed) Labs Reviewed  COMPREHENSIVE METABOLIC PANEL - Abnormal; Notable for the following components:      Result Value   Glucose, Bld 160 (*)    BUN 35 (*)    Creatinine, Ser 2.10 (*)    AST 53 (*)    Total Bilirubin 3.9 (*)    GFR, Estimated 33 (*)    All other components within normal  limits  PROTIME-INR  APTT  CBC  DIFFERENTIAL  ETHANOL  I-STAT CREATININE, ED  CBG MONITORING, ED     EKG Interpreted by me Atrial fibrillation, rate of 101.  Left axis, right bundle branch block, frequent PVCs.  No acute ischemic changes.   RADIOLOGY CT head interpreted by me, negative for mass or intracranial hemorrhage.  Discussed with radiologist who confirms it is negative for acute findings.  CT angiogram head neck with perfusion discussed with radiologist, positive for acute occlusion of proximal M2 vessel.   PROCEDURES:  .Critical Care  Performed by: Sharman Cheek, MD Authorized by: Sharman Cheek, MD   Critical care provider statement:    Critical care time (minutes):  35   Critical care time was exclusive of:  Separately billable procedures and treating other patients   Critical care was necessary to treat or prevent imminent or life-threatening deterioration of the following conditions:  Sepsis and respiratory failure   Critical care was time spent  personally by me on the following activities:  Development of treatment plan with patient or surrogate, discussions with consultants, evaluation of patient's response to treatment, examination of patient, obtaining history from patient or surrogate, ordering and performing treatments and interventions, ordering and review of laboratory studies, ordering and review of radiographic studies, pulse oximetry, re-evaluation of patient's condition and review of old charts   Care discussed with: admitting provider   Comments:        .1-3 Lead EKG Interpretation  Performed by: Sharman Cheek, MD Authorized by: Sharman Cheek, MD     Interpretation: abnormal     ECG rate:  85   ECG rate assessment: normal     Rhythm: atrial fibrillation     Ectopy: PVCs      MEDICATIONS ORDERED IN ED: Medications  sodium chloride flush (NS) 0.9 % injection 3 mL (3 mLs Intravenous Given 11/04/2021 1732)  iohexol (OMNIPAQUE) 350  MG/ML injection 100 mL (100 mLs Intravenous Contrast Given 11/22/2021 1632)     IMPRESSION / MDM / ASSESSMENT AND PLAN / ED COURSE  I reviewed the triage vital signs and the nursing notes.                              Differential diagnosis includes, but is not limited to, intracranial hemorrhage, intracranial mass, ischemic stroke, electrolyte abnormality, dehydration.  Doubt meningitis or encephalitis.  Patient's presentation is most consistent with acute presentation with potential threat to life or bodily function.     Clinical Course as of 11/20/2021 1740  Thu Nov 23, 2021  1616 BS 150. Obvious aphasia, L facial droop, possible LVO. Maintaing airway. Awake and alert. [PS]    Clinical Course User Index [PS] Sharman Cheek, MD     ----------------------------------------- 5:39 PM on 11/01/2021 ----------------------------------------- Patient not a candidate for thrombolysis due to Eliquis.  He is potentially a neuro IR endovascular intervention candidate due to LVO, but symptoms are improving.  On discussion with neurologist Dr. Roda Shutters, they would not proceed to intervention right now, but will plan to transfer to [facility] for neuro ICU care and availability of IR if needed.  FINAL CLINICAL IMPRESSION(S) / ED DIAGNOSES   Final diagnoses:  Acute ischemic stroke (HCC)  Aphasia  Chronic systolic congestive heart failure (HCC)  Pulmonary hypertension (HCC)  Type 2 diabetes mellitus without complication, without long-term current use of insulin (HCC)     Rx / DC Orders   ED Discharge Orders     None        Note:  This document was prepared using Dragon voice recognition software and may include unintentional dictation errors.   Sharman Cheek, MD 11/02/2021 1740

## 2021-11-24 ENCOUNTER — Inpatient Hospital Stay (HOSPITAL_COMMUNITY): Payer: Medicare PPO | Admitting: Certified Registered"

## 2021-11-24 ENCOUNTER — Encounter (HOSPITAL_COMMUNITY): Admission: EM | Disposition: E | Payer: Self-pay | Source: Other Acute Inpatient Hospital | Attending: Internal Medicine

## 2021-11-24 ENCOUNTER — Inpatient Hospital Stay (HOSPITAL_COMMUNITY): Payer: Medicare PPO

## 2021-11-24 DIAGNOSIS — N179 Acute kidney failure, unspecified: Secondary | ICD-10-CM | POA: Diagnosis not present

## 2021-11-24 DIAGNOSIS — I482 Chronic atrial fibrillation, unspecified: Secondary | ICD-10-CM | POA: Diagnosis not present

## 2021-11-24 DIAGNOSIS — I4891 Unspecified atrial fibrillation: Secondary | ICD-10-CM | POA: Diagnosis not present

## 2021-11-24 DIAGNOSIS — I63412 Cerebral infarction due to embolism of left middle cerebral artery: Secondary | ICD-10-CM

## 2021-11-24 DIAGNOSIS — I428 Other cardiomyopathies: Secondary | ICD-10-CM | POA: Diagnosis not present

## 2021-11-24 DIAGNOSIS — I6389 Other cerebral infarction: Secondary | ICD-10-CM

## 2021-11-24 DIAGNOSIS — I639 Cerebral infarction, unspecified: Secondary | ICD-10-CM

## 2021-11-24 DIAGNOSIS — I63512 Cerebral infarction due to unspecified occlusion or stenosis of left middle cerebral artery: Secondary | ICD-10-CM | POA: Diagnosis not present

## 2021-11-24 DIAGNOSIS — I110 Hypertensive heart disease with heart failure: Secondary | ICD-10-CM

## 2021-11-24 DIAGNOSIS — I509 Heart failure, unspecified: Secondary | ICD-10-CM

## 2021-11-24 DIAGNOSIS — I5042 Chronic combined systolic (congestive) and diastolic (congestive) heart failure: Secondary | ICD-10-CM | POA: Diagnosis not present

## 2021-11-24 DIAGNOSIS — I420 Dilated cardiomyopathy: Secondary | ICD-10-CM

## 2021-11-24 DIAGNOSIS — R570 Cardiogenic shock: Secondary | ICD-10-CM | POA: Diagnosis not present

## 2021-11-24 HISTORY — PX: IR PERCUTANEOUS ART THROMBECTOMY/INFUSION INTRACRANIAL INC DIAG ANGIO: IMG6087

## 2021-11-24 HISTORY — PX: RADIOLOGY WITH ANESTHESIA: SHX6223

## 2021-11-24 HISTORY — PX: IR US GUIDE VASC ACCESS RIGHT: IMG2390

## 2021-11-24 HISTORY — PX: IR CT HEAD LTD: IMG2386

## 2021-11-24 LAB — CREATININE, SERUM
Creatinine, Ser: 1.98 mg/dL — ABNORMAL HIGH (ref 0.61–1.24)
GFR, Estimated: 35 mL/min — ABNORMAL LOW (ref >60)

## 2021-11-24 LAB — ECHOCARDIOGRAM COMPLETE
Height: 72 in
MV M vel: 3.84 m/s
MV Peak grad: 59 mmHg
Radius: 0.7 cm
S' Lateral: 7.5 cm
Weight: 3033.53 oz

## 2021-11-24 LAB — BASIC METABOLIC PANEL
Anion gap: 15 (ref 5–15)
BUN: 33 mg/dL — ABNORMAL HIGH (ref 8–23)
CO2: 17 mmol/L — ABNORMAL LOW (ref 22–32)
Calcium: 8.7 mg/dL — ABNORMAL LOW (ref 8.9–10.3)
Chloride: 105 mmol/L (ref 98–111)
Creatinine, Ser: 2.51 mg/dL — ABNORMAL HIGH (ref 0.61–1.24)
GFR, Estimated: 27 mL/min — ABNORMAL LOW (ref >60)
Glucose, Bld: 137 mg/dL — ABNORMAL HIGH (ref 70–99)
Potassium: 3.9 mmol/L (ref 3.5–5.1)
Sodium: 137 mmol/L (ref 135–145)

## 2021-11-24 LAB — LIPID PANEL
Cholesterol: 150 mg/dL (ref 0–200)
HDL: 62 mg/dL (ref >40)
LDL Cholesterol: 79 mg/dL (ref 0–99)
Total CHOL/HDL Ratio: 2.4 RATIO
Triglycerides: 44 mg/dL (ref <150)
VLDL: 9 mg/dL (ref 0–40)

## 2021-11-24 LAB — GLUCOSE, CAPILLARY
Glucose-Capillary: 158 mg/dL — ABNORMAL HIGH (ref 70–99)
Glucose-Capillary: 108 mg/dL — ABNORMAL HIGH (ref 70–99)
Glucose-Capillary: 106 mg/dL — ABNORMAL HIGH (ref 70–99)
Glucose-Capillary: 118 mg/dL — ABNORMAL HIGH (ref 70–99)
Glucose-Capillary: 124 mg/dL — ABNORMAL HIGH (ref 70–99)

## 2021-11-24 LAB — CBC
HCT: 43.9 % (ref 39.0–52.0)
Hemoglobin: 14.3 g/dL (ref 13.0–17.0)
MCH: 29.4 pg (ref 26.0–34.0)
MCHC: 32.6 g/dL (ref 30.0–36.0)
MCV: 90.1 fL (ref 80.0–100.0)
Platelets: 180 10*3/uL (ref 150–400)
RBC: 4.87 MIL/uL (ref 4.22–5.81)
RDW: 14.4 % (ref 11.5–15.5)
WBC: 6.5 10*3/uL (ref 4.0–10.5)
nRBC: 0 % (ref 0.0–0.2)

## 2021-11-24 LAB — HEMOGLOBIN A1C
Hgb A1c MFr Bld: 5.5 % (ref 4.8–5.6)
Mean Plasma Glucose: 111.15 mg/dL

## 2021-11-24 LAB — CBG MONITORING, ED: Glucose-Capillary: 175 mg/dL — ABNORMAL HIGH (ref 70–99)

## 2021-11-24 LAB — MRSA NEXT GEN BY PCR, NASAL: MRSA by PCR Next Gen: NOT DETECTED

## 2021-11-24 SURGERY — IR WITH ANESTHESIA
Anesthesia: General

## 2021-11-24 MED ORDER — SODIUM CHLORIDE 0.9 % IV SOLN: 250.0000 mL | INTRAVENOUS | Status: DC

## 2021-11-24 MED ORDER — VASOPRESSIN 20 UNIT/ML IV SOLN
INTRAVENOUS | Status: DC | PRN
  Administered 2021-11-24 (×3): 2 [IU] via INTRAVENOUS

## 2021-11-24 MED ORDER — FENTANYL CITRATE (PF) 250 MCG/5ML IJ SOLN
INTRAMUSCULAR | Status: DC | PRN
  Administered 2021-11-24: 100 ug via INTRAVENOUS

## 2021-11-24 MED ORDER — PHENYLEPHRINE HCL-NACL 20-0.9 MG/250ML-% IV SOLN: 25.0000 ug/min | INTRAVENOUS | Status: DC

## 2021-11-24 MED ORDER — SUGAMMADEX SODIUM 200 MG/2ML IV SOLN
INTRAVENOUS | Status: DC | PRN
  Administered 2021-11-24: 200 mg via INTRAVENOUS

## 2021-11-24 MED ORDER — LIDOCAINE HCL (CARDIAC) PF 100 MG/5ML IV SOSY
PREFILLED_SYRINGE | INTRAVENOUS | Status: DC | PRN
  Administered 2021-11-24: 100 mg via INTRATRACHEAL

## 2021-11-24 MED ORDER — SODIUM CHLORIDE 0.9 % IV SOLN
250.0000 mL | INTRAVENOUS | Status: DC
  Administered 2021-12-03: 250 mL via INTRAVENOUS

## 2021-11-24 MED ORDER — NOREPINEPHRINE 4 MG/250ML-% IV SOLN
2.0000 ug/min | INTRAVENOUS | Status: DC
  Administered 2021-11-24 (×2): 4 ug/min via INTRAVENOUS
  Administered 2021-11-24: 10 ug/min via INTRAVENOUS
  Administered 2021-11-25: 2 ug/min via INTRAVENOUS
  Filled 2021-11-24 (×3): qty 250

## 2021-11-24 MED ORDER — NOREPINEPHRINE 4 MG/250ML-% IV SOLN: 2.0000 ug/min | INTRAVENOUS | Status: DC

## 2021-11-24 MED ORDER — CLEVIDIPINE BUTYRATE 0.5 MG/ML IV EMUL: 0.0000 mg/h | INTRAVENOUS | Status: DC

## 2021-11-24 MED ORDER — ETOMIDATE 2 MG/ML IV SOLN
INTRAVENOUS | Status: DC | PRN
  Administered 2021-11-24: 16 mg via INTRAVENOUS

## 2021-11-24 MED ORDER — CEFAZOLIN SODIUM-DEXTROSE 2-4 GM/100ML-% IV SOLN
INTRAVENOUS | Status: AC
  Filled 2021-11-24: qty 100

## 2021-11-24 MED ORDER — ROCURONIUM BROMIDE 100 MG/10ML IV SOLN
INTRAVENOUS | Status: DC | PRN
  Administered 2021-11-24: 80 mg via INTRAVENOUS

## 2021-11-24 MED ORDER — PHENYLEPHRINE HCL-NACL 20-0.9 MG/250ML-% IV SOLN
INTRAVENOUS | Status: AC
  Filled 2021-11-24: qty 250

## 2021-11-24 MED ORDER — CEFAZOLIN SODIUM-DEXTROSE 2-3 GM-%(50ML) IV SOLR
INTRAVENOUS | Status: DC | PRN
  Administered 2021-11-24: 2 g via INTRAVENOUS

## 2021-11-24 MED ORDER — ALBUMIN HUMAN 5 % IV SOLN: INTRAVENOUS | Status: DC | PRN

## 2021-11-24 MED ORDER — PHENYLEPHRINE HCL-NACL 20-0.9 MG/250ML-% IV SOLN
25.0000 ug/min | INTRAVENOUS | Status: DC
  Administered 2021-11-24: 80 ug/min via INTRAVENOUS

## 2021-11-24 MED ORDER — NITROGLYCERIN 1 MG/10 ML FOR IR/CATH LAB
INTRA_ARTERIAL | Status: AC
  Filled 2021-11-24: qty 10

## 2021-11-24 MED ORDER — IOHEXOL 300 MG/ML  SOLN
100.0000 mL | Freq: Once | INTRAMUSCULAR | Status: AC | PRN
  Administered 2021-11-24: 40 mL via INTRA_ARTERIAL

## 2021-11-24 MED ORDER — IOHEXOL 300 MG/ML  SOLN
100.0000 mL | Freq: Once | INTRAMUSCULAR | Status: AC | PRN
  Administered 2021-11-24: 45 mL via INTRA_ARTERIAL

## 2021-11-24 MED ORDER — FENTANYL CITRATE (PF) 250 MCG/5ML IJ SOLN
INTRAMUSCULAR | Status: AC
  Filled 2021-11-24: qty 5

## 2021-11-24 MED ORDER — SODIUM CHLORIDE (PF) 0.9 % IJ SOLN
INTRAVENOUS | Status: AC | PRN
  Administered 2021-11-24 (×3): 25 ug via INTRA_ARTERIAL

## 2021-11-24 MED ORDER — SODIUM CHLORIDE 0.9 % IV SOLN: INTRAVENOUS | Status: DC | PRN

## 2021-11-24 MED ORDER — PHENYLEPHRINE HCL-NACL 20-0.9 MG/250ML-% IV SOLN
INTRAVENOUS | Status: DC | PRN
  Administered 2021-11-24: 50 ug/min via INTRAVENOUS
  Administered 2021-11-24: 100 ug/min via INTRAVENOUS

## 2021-11-24 MED ORDER — VASOPRESSIN 20 UNITS/100 ML INFUSION FOR SHOCK
0.0000 [IU]/min | INTRAVENOUS | Status: DC
  Administered 2021-11-24 – 2021-11-25 (×3): 0.03 [IU]/min via INTRAVENOUS
  Filled 2021-11-24 (×4): qty 100

## 2021-11-24 MED ORDER — INSULIN ASPART 100 UNIT/ML IJ SOLN
0.0000 [IU] | INTRAMUSCULAR | Status: DC
  Administered 2021-11-24: 3 [IU] via SUBCUTANEOUS
  Administered 2021-11-25: 2 [IU] via SUBCUTANEOUS
  Administered 2021-11-25: 3 [IU] via SUBCUTANEOUS
  Administered 2021-11-25 – 2021-11-26 (×5): 2 [IU] via SUBCUTANEOUS
  Administered 2021-11-27: 3 [IU] via SUBCUTANEOUS
  Administered 2021-11-27: 5 [IU] via SUBCUTANEOUS
  Administered 2021-11-27: 3 [IU] via SUBCUTANEOUS
  Administered 2021-11-30 – 2021-12-04 (×11): 2 [IU] via SUBCUTANEOUS
  Administered 2021-12-04: 3 [IU] via SUBCUTANEOUS
  Administered 2021-12-04: 2 [IU] via SUBCUTANEOUS
  Administered 2021-12-05: 3 [IU] via SUBCUTANEOUS
  Administered 2021-12-06: 2 [IU] via SUBCUTANEOUS

## 2021-11-24 MED ORDER — NOREPINEPHRINE 4 MG/250ML-% IV SOLN
INTRAVENOUS | Status: DC | PRN
  Administered 2021-11-24: 1 ug/min via INTRAVENOUS

## 2021-11-24 NOTE — Procedures (Signed)
INTERVENTIONAL NEURORADIOLOGY BRIEF POSTPROCEDURE NOTE   DIAGNOSTIC CEREBRAL ANGIOGRAM    Attending: Dr. Marjo Bicker   Assistant: Dr. Julieanne Cotton    Diagnosis: Left M2/MCA occlusion   Access site: RCFA, 4F   Access closure: 6 Fr Angioseal    Anesthesia: GETA   Medication used: refer to anesthesia documentation.   Complications: none.   Estimated blood loss: 30 mL   Specimen: None.   Findings: Occlusion of the distal M2/MCA of the branch of inferior division of the MCA. Mechanical thrombectomy performed with stentriever technique using a 4 mm x 40 mm Solitaire X stent with TICI 2C results. There was dissection of a segment of the treated vessel seen containing some thrombus with mild brief extravasation of the contrast. There is delayed flow seen in the dissected vessel.    The patient tolerated the procedure well without incident and is transferred to ICU after extubation in a stable condition.    PLAN: - Bed rest x6 hours post femoral puncture - SBP 120-140 mmHg  -MRI and MRA head

## 2021-11-24 NOTE — Progress Notes (Signed)
Found patient sitting on side of bed with rail lowered and posey removed. Patient insistent on needing to urinate. Attempted bedside commode despite foley in place on the chance he needed to have a bowel movement. Patient did not seem to understand the purpose of bedside commode and continued attempting to get up. This nurse did not feel safe attempting to ambulate to toilet in bathroom due to patient's agitation. Patient returned to bed with posey adjusted and checked to be sure it was tied appropriately.

## 2021-11-24 NOTE — Evaluation (Signed)
Occupational Therapy Evaluation Patient Details Name: CHENEY EWART MRN: [id number] DOB: June 30, 1950 Today's Date: 12/13/21   History of Present Illness JAQUAIL MCLEES is an 71 y.o. male admitted to [facility] 11/12/2021 with acute aphasia, transfered to [facility] for close observation and possible mechanical thrombectomy of an acute left M2 occlusion. IR: Occlusion of the distal M2/MCA of the branch of inferior division of the MCA. Mechanical thrombectomy performed. There was dissection of a segment of the treated vessel seen containing some thrombus with mild brief extravasation of the contrast. There is delayed flow seen in the dissected vessel. PMHx of Gout, CKD2, nonischemic cardiomyopathy, HFrEF, HTN, PSVT, ICD placement and atrial flutter s/p cardioversion.   Clinical Impression   This 71 yo male admitted with above presents to acute OT with PLOF per girlfriend of him being totally independent with basic ADLs and IADLs as well as ambulating without AD. Currently due to his expressive and receptive difficulties as well as his right visual deficits/inattention he is Mod-Max A (more cueing than actual A) for basic ADLs. He will continue to benefit from acute OT with follow up on AIR.      Recommendations for follow up therapy are one component of a multi-disciplinary discharge planning process, led by the attending physician.  Recommendations may be updated based on patient status, additional functional criteria and insurance authorization.   Follow Up Recommendations  Acute inpatient rehab (3hours/day)    Assistance Recommended at Discharge Frequent or constant Supervision/Assistance  Patient can return home with the following A lot of help with walking and/or transfers;A lot of help with bathing/dressing/bathroom;Assistance with cooking/housework;Assistance with feeding;Help with stairs or ramp for entrance;Assist for transportation;Direct supervision/assist for medications management;Direct  supervision/assist for financial management    Functional Status Assessment  Patient has had a recent decline in their functional status and demonstrates the ability to make significant improvements in function in a reasonable and predictable amount of time.  Equipment Recommendations  Other (comment) (TBD next vneue)    Recommendations for Other Services Rehab consult     Precautions / Restrictions Precautions Precautions: Fall;Other (comment) Precaution Comments: R inattention; SBP < 180 Restrictions Weight Bearing Restrictions: No      Mobility Bed Mobility Overal bed mobility: Needs Assistance Bed Mobility: Supine to Sit, Sit to Supine     Supine to sit: Min assist, HOB elevated Sit to supine: Mod assist, HOB elevated, +2 for safety/equipment   General bed mobility comments: Pt requiring minA to initiate/guide pt to sit up R EOB due to communication/cognition deficits. ModA to return to supine while sitting on R EOB, again due to communication and cognition deficits and R inattention.    Transfers Overall transfer level: Needs assistance Equipment used: Rolling walker (2 wheels) Transfers: Sit to/from Stand Sit to Stand: Min assist, +2 safety/equipment           General transfer comment: MinA for safety to come to stand from EOB, needing visual and tactile cues to initiate.      Balance Overall balance assessment: Needs assistance   Sitting balance-Leahy Scale: Fair Sitting balance - Comments: Min guard for safety static sitting EOB   Standing balance support: Bilateral upper extremity supported, During functional activity, Reliant on assistive device for balance   Standing balance comment: Reliant on RW and min-modA                           ADL either performed or assessed with clinical judgement  ADL Overall ADL's : Needs assistance/impaired Eating/Feeding: NPO   Grooming: Moderate assistance;Sitting   Upper Body Bathing: Moderate  assistance;Sitting   Lower Body Bathing: Maximal assistance Lower Body Bathing Details (indicate cue type and reason): min A sit<>stand Upper Body Dressing : Maximal assistance;Sitting   Lower Body Dressing: Maximal assistance Lower Body Dressing Details (indicate cue type and reason): min A sit<.>stand Toilet Transfer: Minimal assistance;+2 for safety/equipment;+2 for physical assistance;Ambulation;Rolling walker (2 wheels) Toilet Transfer Details (indicate cue type and reason): simulated bed>out door to room several feet>back to room and into bed for a test. Needed consistent directing of RW due to right visual deficits. Toileting- Clothing Manipulation and Hygiene: Total assistance Toileting - Clothing Manipulation Details (indicate cue type and reason): min A sit<>stand             Vision Baseline Vision/History: 0 No visual deficits Ability to See in Adequate Light: 0 Adequate Vision Assessment?: Vision impaired- to be further tested in functional context Additional Comments: Has right inattetion and/or visual field cut as well (with expressive and receptive difficulties hard to test)            Pertinent Vitals/Pain Pain Assessment Pain Assessment: Faces Faces Pain Scale: Hurts a little bit Pain Location: L forarm at site of IV Pain Descriptors / Indicators: Grimacing Pain Intervention(s): Limited activity within patient's tolerance, Monitored during session, Repositioned     Hand Dominance Right   Extremity/Trunk Assessment Upper Extremity Assessment Upper Extremity Assessment: Generalized weakness   Lower Extremity Assessment Lower Extremity Assessment: Difficult to assess due to impaired cognition (R leg appared potentially slightly weaker than L, but was [facility] regardless for tasks performed today, difficult to assess due to communication/cognition deficits.)   Cervical / Trunk Assessment Cervical / Trunk Assessment: Normal   Communication  Communication Communication: Receptive difficulties;Expressive difficulties   Cognition Arousal/Alertness: Awake/alert Behavior During Therapy: Flat affect Overall Cognitive Status: Difficult to assess Area of Impairment: Attention, Following commands, Safety/judgement, Awareness, Problem solving                   Current Attention Level: Sustained   Following Commands: Follows one step commands inconsistently, Follows one step commands with increased time Safety/Judgement: Decreased awareness of safety, Decreased awareness of deficits Awareness: Intellectual Problem Solving: Slow processing, Requires verbal cues, Requires tactile cues, Difficulty sequencing, Decreased initiation General Comments: Pt limited by expressive and receptive language deficits, thus unable to formally assess cognition. However, pt appeared unaware of his R inattention and had poor problem-solving to turn his head to the R to see what the RW was stuck on, instead repeatedly running RW into the door on the R. Benefits from visual demonstration cues and tactile cues.     General Comments  VSS on RA; encouraged pt's girlfriend to sit pt's R to get him to attend to R as long as he is successful            Home Living Family/patient expects to be discharged to:: Private residence Living Arrangements: Spouse/significant other (girlfriend) Available Help at Discharge: Family (girlfriend works, family may be able to stay with him) Type of Home: Apartment Home Access: Stairs to enter [employer] of Steps: 3 Entrance Stairs-Rails: None Home Layout: One level     Bathroom Shower/Tub: Chief Strategy Officer: Standard Bathroom Accessibility: Yes   Home Equipment: None          Prior Functioning/Environment Prior Level of Function : Independent/Modified Independent;Driving  Mobility Comments: No AD. No falls. ADLs Comments: Does not work. Reading glasses PRN.         OT Problem List: Impaired balance (sitting and/or standing);Impaired vision/perception;Decreased cognition;Decreased safety awareness      OT Treatment/Interventions: Self-care/ADL training;DME and/or AE instruction;Patient/family education;Balance training;Visual/perceptual remediation/compensation;Cognitive remediation/compensation    OT Goals(Current goals can be found in the care plan section) Acute Rehab OT Goals Patient Stated Goal: agreeable to up and OOB OT Goal Formulation: With family Time For Goal Achievement: 12/08/21 Potential to Achieve Goals: Good  OT Frequency: Min 2X/week    Co-evaluation PT/OT/SLP Co-Evaluation/Treatment: Yes Reason for Co-Treatment: Necessary to address cognition/behavior during functional activity;For patient/therapist safety;To address functional/ADL transfers PT goals addressed during session: Mobility/safety with mobility;Balance;Proper use of DME OT goals addressed during session: Strengthening/ROM;ADL's and self-care      AM-PAC OT "6 Clicks" Daily Activity     Outcome Measure Help from another person eating meals?: Total Help from another person taking care of personal grooming?: A Lot Help from another person toileting, which includes using toliet, bedpan, or urinal?: A Lot Help from another person bathing (including washing, rinsing, drying)?: A Lot Help from another person to put on and taking off regular upper body clothing?: A Lot Help from another person to put on and taking off regular lower body clothing?: A Lot 6 Click Score: 11   End of Session Equipment Utilized During Treatment: Gait belt;Rolling walker (2 wheels) Nurse Communication: Mobility status  Activity Tolerance: Patient tolerated treatment well Patient left: in bed;with call bell/phone within reach (bedside test being completed)  OT Visit Diagnosis: Unsteadiness on feet (R26.81);Other abnormalities of gait and mobility (R26.89);Low vision, both eyes  (H54.2);Other symptoms and signs involving cognitive function;Cognitive communication deficit (R41.841) Symptoms and signs involving cognitive functions: Other Nontraumatic ICH                Time: 7408-1448 OT Time Calculation (min): 23 min Charges:  OT General Charges $OT Visit: 1 Visit OT Evaluation $OT Eval Moderate Complexity: 1 Mod  Ignacia Palma, OTR/L Acute [employer] [facility] [phone] Office [phone]    Evette Georges 11/26/2021, 11:53 AM

## 2021-11-24 NOTE — Anesthesia Postprocedure Evaluation (Signed)
Anesthesia Post Note  Patient: Steve Salazar  Procedure(s) Performed: IR WITH ANESTHESIA     Patient location during evaluation: PACU Anesthesia Type: General Level of consciousness: sedated Pain management: pain level controlled Vital Signs Assessment: post-procedure vital signs reviewed and stable Respiratory status: spontaneous breathing and respiratory function stable Cardiovascular status: stable Postop Assessment: no apparent nausea or vomiting Anesthetic complications: no   No notable events documented.  Last Vitals:  Vitals:   11/29/2021 0615 2021/11/29 0630  BP: 96/84 107/89  Pulse: 83 88  Resp: 12 (!) 32  Temp:    SpO2: 92% (!) 87%    Last Pain:  Vitals:   11/03/2021 2326  TempSrc: Oral  PainSc: 0-No pain                 Vedansh Kerstetter DANIEL

## 2021-11-24 NOTE — Progress Notes (Signed)
Referring Physician(s): Code Stroke  Supervising Physician: Marjo Bicker  Patient Status:  [facility] - In-pt  Chief Complaint: Code Stroke M2, LMCA occlusion  Subjective: Patient recovery in 4N.  Lying flat for arterial stick as well as BP.  On pressors for hypotension.  A-line has failed.  Remains globally aphasic.   Allergies: Patient has no known allergies.  Medications: Prior to Admission medications   Medication Sig Start Date End Date Taking? Authorizing Provider  acetaminophen (TYLENOL) 500 MG tablet Take 1,000 mg by mouth every 6 (six) hours as needed for mild pain or moderate pain.    [provider]  apixaban (ELIQUIS) 5 MG TABS tablet Take 5 mg by mouth 2 (two) times daily. 08/07/21 10/16/21  [provider]  colchicine 0.6 MG tablet  05/25/21   [provider]  diclofenac Sodium (VOLTAREN) 1 % GEL Apply 2 g topically daily as needed (pain). 05/29/21 05/29/22  [provider]  empagliflozin (JARDIANCE) 10 MG TABS tablet Take 1 tablet by mouth daily. 07/07/21   [provider]  famotidine (PEPCID) 20 MG tablet Take 1 tablet (20 mg total) by mouth daily as needed for heartburn or indigestion. 10/09/21   [name], MD  furosemide (LASIX) 40 MG tablet Take 40 mg by mouth daily as needed for fluid or edema. 07/04/21   [provider]  losartan (COZAAR) 25 MG tablet Take 25 mg by mouth daily. 06/30/21 06/30/22  [provider]  magnesium oxide (MAG-OX) 400 MG tablet Take 400 mg by mouth daily. 04/26/21 04/26/22  [provider]  Multiple Vitamins-Minerals (CENTRUM ADULTS PO) Take 1 tablet by mouth daily.    [provider]  potassium chloride SA (KLOR-CON M) 20 MEQ tablet Take 20 mEq by mouth daily. 07/16/21   [provider]  predniSONE (DELTASONE) 20 MG tablet Take 20 mg by mouth daily as needed (Gout). 07/10/21 07/10/22  [provider]  spironolactone (ALDACTONE) 25 MG tablet Take  25 mg by mouth daily. 06/30/21   [provider]  tamsulosin (FLOMAX) 0.4 MG CAPS capsule Take 0.4 mg by mouth daily.     [provider]  UNABLE TO FIND Med Name: Continuous IV Medication for Heart. Site: Upper right arm.    [provider]     Vital Signs: BP (!) 87/71   Pulse 92   Temp 97.8 F (36.6 C) (Oral)   Resp (!) 25   Ht 6' (1.829 m)   Wt 189 lb 9.5 oz (86 kg)   SpO2 95%   BMI 25.71 kg/m   Physical Exam NAD, alert Neuro: EOMs intact, Facial symmetry.  Globally aphasic. Repeating phrases "Get the noise." "Turn down the noise."  Unable to recall words, names.  Skin: groin site stable, no bruising or hematoma.   Imaging: ECHOCARDIOGRAM COMPLETE  Result Date: 12-08-2021    ECHOCARDIOGRAM REPORT   Patient Name:   Steve Salazar Date of Exam: 12/08/2021 Medical Rec #:  [id number]         Height:       72.0 in Accession #:    [id number]        Weight:       189.6 lb Date of Birth:  1950-12-30          BSA:          2.083 m Patient Age:    71 years          BP:  92/57 mmHg Patient Gender: M                 HR:           91 bpm. Exam Location:  Inpatient Procedure: 2D Echo, Color Doppler, Cardiac Doppler and 3D Echo Indications:     Stroke i63.9  History:         Patient has prior history of Echocardiogram examinations, most                  recent 12/22/2019. CHF, Defibrillator, Arrythmias:Atrial                  Flutter; Risk Factors:Hypertension.  Sonographer:     Raquel Sarna Senior RDCS Referring Phys:  [phone] ERIC LINDZEN Diagnosing Phys: [place] IMPRESSIONS  1. Left ventricular ejection fraction, by estimation, is 20%. The left ventricle has severely decreased function. The left ventricle demonstrates global hypokinesis. The left ventricular internal cavity size was severely dilated. Left ventricular diastolic function could not be evaluated.  2. Right ventricular systolic function is mildly reduced. The right ventricular size is moderately enlarged.  There is moderately elevated pulmonary artery systolic pressure.  3. Left atrial size was severely dilated.  4. Right atrial size was severely dilated.  5. Functional MR. The mitral valve is normal in structure. Moderate to severe mitral valve regurgitation.  6. Tricuspid valve regurgitation is moderate.  7. The aortic valve is tricuspid. Aortic valve regurgitation is mild.  8. Aneurysm of the ascending aorta, measuring 42 mm.  9. The inferior vena cava is dilated in size with <50% respiratory variability, suggesting right atrial pressure of 15 mmHg. Conclusion(s)/Recommendation(s): Compared to prior echo , EF appears mildly worsened and MR is worse. Recommend TEE when clinically feasible for futher assessment of MR severity. FINDINGS  Left Ventricle: Left ventricular ejection fraction, by estimation, is 20%. The left ventricle has severely decreased function. The left ventricle demonstrates global hypokinesis. The left ventricular internal cavity size was severely dilated. There is no left ventricular hypertrophy. Left ventricular diastolic function could not be evaluated. Right Ventricle: The right ventricular size is moderately enlarged. Right ventricular systolic function is mildly reduced. There is moderately elevated pulmonary artery systolic pressure. The tricuspid regurgitant velocity is 3.09 m/s, and with an assumed right atrial pressure of 15 mmHg, the estimated right ventricular systolic pressure is [id number] mmHg. Left Atrium: Left atrial size was severely dilated. Right Atrium: Right atrial size was severely dilated. Pericardium: There is no evidence of pericardial effusion. Mitral Valve: Functional MR. The mitral valve is normal in structure. Moderate to severe mitral valve regurgitation. Tricuspid Valve: The tricuspid valve is normal in structure. Tricuspid valve regurgitation is moderate. Aortic Valve: The aortic valve is tricuspid. Aortic valve regurgitation is mild. Pulmonic Valve: Pulmonic valve  regurgitation is mild to moderate. Aorta: There is an aneurysm involving the ascending aorta measuring 42 mm. Venous: The inferior vena cava is dilated in size with less than 50% respiratory variability, suggesting right atrial pressure of 15 mmHg. Additional Comments: A device lead is visualized.  LEFT VENTRICLE PLAX 2D LVIDd:         8.30 cm   Diastology LVIDs:         7.50 cm   LV e' medial:  3.15 cm/s LV PW:         0.80 cm   LV e' lateral: 6.38 cm/s LV IVS:        0.70 cm LVOT diam:     2.10 cm  LV SV:         24 LV SV Index:   11 LVOT Area:     3.46 cm  3D Volume EF:                          3D EF:        25 %                          LV EDV:       363 ml                          LV ESV:       272 ml                          LV SV:        91 ml RIGHT VENTRICLE RV S prime:     10.90 cm/s TAPSE (M-mode): 1.3 cm LEFT ATRIUM              Index         RIGHT ATRIUM           Index LA diam:        4.90 cm  2.35 cm/m    RA Area:     42.00 cm LA Vol (A2C):   226.0 ml 108.51 ml/m  RA Volume:   176.00 ml 84.50 ml/m LA Vol (A4C):   139.0 ml 66.74 ml/m LA Biplane Vol: 182.0 ml 87.39 ml/m  AORTIC VALVE LVOT Vmax:   52.20 cm/s LVOT Vmean:  39.900 cm/s LVOT VTI:    0.069 m  AORTA Ao Root diam: 3.40 cm Ao Asc diam:  4.20 cm MR Peak grad:    59.0 mmHg    TRICUSPID VALVE MR Mean grad:    35.0 mmHg    TR Peak grad:   38.2 mmHg MR Vmax:         384.00 cm/s  TR Vmax:        309.00 cm/s MR Vmean:        274.0 cm/s MR PISA:         3.08 cm     SHUNTS MR PISA Eff ROA: 27 mm       Systemic VTI:  0.07 m MR PISA Radius:  0.70 cm      Systemic Diam: 2.10 cm Phineas Inches Electronically signed by Phineas Inches Signature Date/Time: 11/16/2021/11:57:08 AM    Final (Updated)    CT HEAD WO CONTRAST (5MM)  Result Date: 10/28/2021 CLINICAL DATA:  Provided history: Stroke, follow-up. EXAM: CT HEAD WITHOUT CONTRAST TECHNIQUE: Contiguous axial images were obtained from the base of the skull through the vertex without intravenous contrast.  RADIATION DOSE REDUCTION: This exam was performed according to the departmental dose-optimization program which includes automated exposure control, adjustment of the mA and/or kV according to patient size and/or use of iterative reconstruction technique. COMPARISON:  Noncontrast head CT and CT angiogram head/neck 11/17/2021. Postprocedural flat panel head CT 11/16/2021. FINDINGS: Mildly motion degraded exam. Residual circulating intravascular contrast. Brain: No age advanced or lobar predominant parenchymal atrophy. Small-volume subarachnoid hemorrhage/extravasated contrast along the left cerebral hemisphere has slightly increased since the postprocedural flat panel head CT of 11/17/2021. Subarachnoid hemorrhage/extravasated contrast is greatest within the left sylvian fissure and along the left parietooccipital lobes. No definite acute demarcated cortical  infarct is identified. No evidence of an intracranial mass. No midline shift or hydrocephalus. Vascular: Residual circulating intravascular contrast limits evaluation for hyperdense vessels. Atherosclerotic calcifications. Skull: No fracture or aggressive osseous lesion. Sinuses/Orbits: No mass or acute finding within the imaged orbits. Mild mucosal thickening within the bilateral ethmoid, sphenoid and maxillary sinuses. Superimposed small-volume fluid within the bilateral sphenoid sinuses. IMPRESSION: Small-volume acute subarachnoid hemorrhage/extravasated contrast along the left cerebral hemisphere has slightly increased since the postprocedural flat panel head CT of 11/01/2021. No evidence of hydrocephalus. No definite acute infarct is identified. Mild paranasal sinus disease, as described. Electronically Signed   By: Jackey Loge D.O.   On: 11/09/2021 09:46   CT ANGIO HEAD NECK W WO CM (CODE STROKE)  Result Date: 2021/12/03 CLINICAL DATA:  Neuro deficit, acute, stroke suspected. EXAM: CT ANGIOGRAPHY HEAD AND NECK TECHNIQUE: Multidetector CT imaging of the  head and neck was performed using the standard protocol during bolus administration of intravenous contrast. Multiplanar CT image reconstructions and MIPs were obtained to evaluate the vascular anatomy. Carotid stenosis measurements (when applicable) are obtained utilizing NASCET criteria, using the distal internal carotid diameter as the denominator. RADIATION DOSE REDUCTION: This exam was performed according to the departmental dose-optimization program which includes automated exposure control, adjustment of the mA and/or kV according to patient size and/or use of iterative reconstruction technique. CONTRAST:  OMNIPAQUE IOHEXOL 350 MG/ML SOLN COMPARISON:  Noncontrast head CT performed immediately prior 12/03/2021. FINDINGS: CTA NECK FINDINGS Examination limited by suboptimal arterial contrast enhancement. Aortic arch: Common origin of the innominate and left common carotid arteries. Atherosclerotic plaque within the visualized aortic arch and proximal major branch vessels of the neck. Streak and beam hardening artifact arising from a dense left-sided contrast bolus partially obscures the left subclavian artery. Within this limitation, there is no appreciable hemodynamically significant innominate or proximal subclavian artery stenosis. Right carotid system: CCA and ICA patent within the neck without stenosis or significant atherosclerotic disease. Left carotid system: CCA and ICA patent within the neck without stenosis. Minimal atherosclerotic plaque about the carotid bifurcation and within the proximal ICA. Vertebral arteries: The vertebral arteries appear patent within the neck. However, the vertebral arteries are poorly assessed due to small size and poor arterial contrast enhancement. Additionally, extensive venous reflux limits evaluation of the vertebral arteries within the lower neck. Skeleton: Cervical spondylosis. No acute fracture or aggressive osseous lesion. Other neck: 14 mm right thyroid lobe  nodule, not meeting consensus criteria for ultrasound follow-up based on size. No cervical lymphadenopathy. Upper chest: No consolidation within the imaged lung apices. Review of the MIP images confirms the above findings CTA HEAD FINDINGS Examination limited by suboptimal arterial contrast enhancement. Anterior circulation: The intracranial internal carotid arteries are patent. Atherosclerotic plaque and sites of at least mild stenosis within the intracranial ICAs, bilaterally. The M1 middle cerebral arteries are patent. Occluded proximal left M2 MCA vessel (series 18, image 55) (series 20, image 32). No right M2 proximal branch occlusion is identified. Atherosclerotic irregularity of the M2 and more distal right MCA vessels. The anterior cerebral arteries are patent. Atherosclerotic irregularity of both vessels which is poorly quantified due to relatively small vessel size and poor arterial contrast enhancement. Within described limitations, no intracranial aneurysm is identified. Posterior circulation: The intracranial vertebral arteries are patent. The left vertebral artery is developmentally small, but patent. The intracranial right vertebral artery is patent without definite stenosis. The basilar artery is patent. Mild atherosclerotic irregularity of the basilar artery. The posterior cerebral arteries  are patent proximally without high-grade stenosis. Both posterior cerebral arteries appear predominantly fetal in origin. Venous sinuses: Within the limitations of contrast timing, no convincing thrombus. Anatomic variants: As described. Review of the MIP images confirms the above findings CTA head impression #2 called by telephone at the time of interpretation on 11/24/2021 at 5:05 pm to provider Dr. Joni Fears, Who verbally acknowledged these results. IMPRESSION: CTA neck: 1. Examination limited by poor arterial contrast enhancement. 2. The common carotid and internal carotid arteries are patent within the neck  without stenosis. Minimal atherosclerotic plaque on the left. 3. The vertebral arteries appear patent within the neck. However, the vertebral arteries are poorly assessed due to small size and due to poor arterial contrast enhancement. Additionally, prominent venous reflux limits evaluation the vertebral arteries at the level of the lower neck. CTA head: 1. Examination limited by poor arterial contrast timing. 2. Occluded proximal left M2 MCA vessel. Neuro-interventional consultation is recommended. 3. Additional intracranial atherosclerotic disease, as described. Electronically Signed   By: Kellie Simmering D.O.   On: 11/21/2021 17:40   CT HEAD CODE STROKE WO CONTRAST  Result Date: 11/14/2021 CLINICAL DATA:  Code stroke.  Aphasia EXAM: CT HEAD WITHOUT CONTRAST TECHNIQUE: Contiguous axial images were obtained from the base of the skull through the vertex without intravenous contrast. RADIATION DOSE REDUCTION: This exam was performed according to the departmental dose-optimization program which includes automated exposure control, adjustment of the mA and/or kV according to patient size and/or use of iterative reconstruction technique. COMPARISON:  None Available. FINDINGS: Brain: There is no acute intracranial hemorrhage, extra-axial fluid collection, or acute infarct. Parenchymal volume is normal. The ventricles are normal in size. Gray-white differentiation is preserved. There is no mass lesion.  There is no mass effect or midline shift. Vascular: There is calcification of the bilateral cavernous ICAs. Skull: Normal. Negative for fracture or focal lesion. Sinuses/Orbits: The imaged paranasal sinuses are clear. The imaged globes and orbits are unremarkable. Other: None. ASPECTS [facility] Stroke Program Early CT Score) - Ganglionic level infarction (caudate, lentiform nuclei, internal capsule, insula, M1-M3 cortex): 7 - Supraganglionic infarction (M4-M6 cortex): 3 Total score (0-10 with 10 being normal): 10  IMPRESSION: 1. No acute intracranial pathology. 2. ASPECTS is 10 These results were called by telephone at the time of interpretation on 11/13/2021 at 4:27 pm to provider PHILLIP STAFFORD , who verbally acknowledged these results. Electronically Signed   By: Valetta Mole M.D.   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 Gerhard Perches with TICI 2C results in the setting of treated vessel dissection with residual thrombus and small extravasation  Patient recovery on unit.  Globally aphasia, repeating words and phrases.  Not consistently following commands.  Family at bedside.  BP management per cardiology.  IR remains available.    Electronically Signed: Docia Barrier, PA 11/01/2021, 12:55 PM   I spent a total of 15 Minutes at the the patient's bedside AND on the patient's hospital floor or unit, greater than 50% of which was counseling/coordinating care for LMCA occlusion.

## 2021-11-24 NOTE — Anesthesia Procedure Notes (Signed)
Arterial Line Insertion Start/End07/26/2023 1:18 AM, 10/29/2021 1:28 AM Performed by: Heather Roberts, MD, anesthesiologist  Patient location: Pre-op. Preanesthetic checklist: patient identified, IV checked, site marked, risks and benefits discussed, surgical consent, monitors and equipment checked, pre-op evaluation, timeout performed and anesthesia consent Lidocaine 1% used for infiltration Left, radial was placed Catheter size: 20 Fr Hand hygiene performed  and maximum sterile barriers used   Attempts: 1 Procedure performed without using ultrasound guided technique. Following insertion, dressing applied and Biopatch. Post procedure assessment: normal and unchanged  Patient tolerated the procedure well with no immediate complications.

## 2021-11-24 NOTE — Progress Notes (Addendum)
STROKE TEAM PROGRESS NOTE   INTERVAL HISTORY His wife is at the bedside.  BP is still low- currently on neo and levo- plan to transition to vaso and levo.  Will be screened for CIR candidacy as well.   Vitals:   December 08, 2021 0715 12-08-21 0730 12-08-2021 0745 Dec 08, 2021 0800  BP:  124/88 103/85 112/83  Pulse: 74 85 85 79  Resp: (!) 33 (!) 28 (!) 30 (!) 21  Temp:      TempSrc:      SpO2: 94% 93% 92% (!) 86%  Weight:      Height:       CBC:  Recent Labs  Lab 11/21/2021 1617 12-08-21 0639  WBC 7.3 6.5  NEUTROABS 4.6  --   HGB 15.2 14.3  HCT 47.0 43.9  MCV 89.0 90.1  PLT 202 180   Basic Metabolic Panel:  Recent Labs  Lab 11/13/2021 1617 Dec 08, 2021 0639  NA 138  --   K 3.9  --   CL 104  --   CO2 22  --   GLUCOSE 160*  --   BUN 35*  --   CREATININE 2.10* 1.98*  CALCIUM 9.2  --    Lipid Panel:  Recent Labs  Lab Dec 08, 2021 0627  CHOL 150  TRIG 44  HDL 62  CHOLHDL 2.4  VLDL 9  LDLCALC 79   HgbA1c:  Recent Labs  Lab December 08, 2021 0640  HGBA1C 5.5   Urine Drug Screen: No results for input(s): "LABOPIA", "COCAINSCRNUR", "LABBENZ", "AMPHETMU", "THCU", "LABBARB" in the last 168 hours.  Alcohol Level  Recent Labs  Lab 11/19/2021 1617  ETH <10    IMAGING past 24 hours CT ANGIO HEAD NECK W WO CM (CODE STROKE)  Result Date: 11/11/2021 CLINICAL DATA:  Neuro deficit, acute, stroke suspected. EXAM: CT ANGIOGRAPHY HEAD AND NECK TECHNIQUE: Multidetector CT imaging of the head and neck was performed using the standard protocol during bolus administration of intravenous contrast. Multiplanar CT image reconstructions and MIPs were obtained to evaluate the vascular anatomy. Carotid stenosis measurements (when applicable) are obtained utilizing NASCET criteria, using the distal internal carotid diameter as the denominator. RADIATION DOSE REDUCTION: This exam was performed according to the departmental dose-optimization program which includes automated exposure control, adjustment of the mA and/or  kV according to patient size and/or use of iterative reconstruction technique. CONTRAST:  OMNIPAQUE IOHEXOL 350 MG/ML SOLN COMPARISON:  Noncontrast head CT performed immediately prior 11/13/2021. FINDINGS: CTA NECK FINDINGS Examination limited by suboptimal arterial contrast enhancement. Aortic arch: Common origin of the innominate and left common carotid arteries. Atherosclerotic plaque within the visualized aortic arch and proximal major branch vessels of the neck. Streak and beam hardening artifact arising from a dense left-sided contrast bolus partially obscures the left subclavian artery. Within this limitation, there is no appreciable hemodynamically significant innominate or proximal subclavian artery stenosis. Right carotid system: CCA and ICA patent within the neck without stenosis or significant atherosclerotic disease. Left carotid system: CCA and ICA patent within the neck without stenosis. Minimal atherosclerotic plaque about the carotid bifurcation and within the proximal ICA. Vertebral arteries: The vertebral arteries appear patent within the neck. However, the vertebral arteries are poorly assessed due to small size and poor arterial contrast enhancement. Additionally, extensive venous reflux limits evaluation of the vertebral arteries within the lower neck. Skeleton: Cervical spondylosis. No acute fracture or aggressive osseous lesion. Other neck: 14 mm right thyroid lobe nodule, not meeting consensus criteria for ultrasound follow-up based on size. No cervical lymphadenopathy.  Upper chest: No consolidation within the imaged lung apices. Review of the MIP images confirms the above findings CTA HEAD FINDINGS Examination limited by suboptimal arterial contrast enhancement. Anterior circulation: The intracranial internal carotid arteries are patent. Atherosclerotic plaque and sites of at least mild stenosis within the intracranial ICAs, bilaterally. The M1 middle cerebral arteries are patent.  Occluded proximal left M2 MCA vessel (series 18, image 55) (series 20, image 32). No right M2 proximal branch occlusion is identified. Atherosclerotic irregularity of the M2 and more distal right MCA vessels. The anterior cerebral arteries are patent. Atherosclerotic irregularity of both vessels which is poorly quantified due to relatively small vessel size and poor arterial contrast enhancement. Within described limitations, no intracranial aneurysm is identified. Posterior circulation: The intracranial vertebral arteries are patent. The left vertebral artery is developmentally small, but patent. The intracranial right vertebral artery is patent without definite stenosis. The basilar artery is patent. Mild atherosclerotic irregularity of the basilar artery. The posterior cerebral arteries are patent proximally without high-grade stenosis. Both posterior cerebral arteries appear predominantly fetal in origin. Venous sinuses: Within the limitations of contrast timing, no convincing thrombus. Anatomic variants: As described. Review of the MIP images confirms the above findings CTA head impression #2 called by telephone at the time of interpretation on 12/08/21 at 5:05 pm to provider Dr. Scotty Court, Who verbally acknowledged these results. IMPRESSION: CTA neck: 1. Examination limited by poor arterial contrast enhancement. 2. The common carotid and internal carotid arteries are patent within the neck without stenosis. Minimal atherosclerotic plaque on the left. 3. The vertebral arteries appear patent within the neck. However, the vertebral arteries are poorly assessed due to small size and due to poor arterial contrast enhancement. Additionally, prominent venous reflux limits evaluation the vertebral arteries at the level of the lower neck. CTA head: 1. Examination limited by poor arterial contrast timing. 2. Occluded proximal left M2 MCA vessel. Neuro-interventional consultation is recommended. 3. Additional  intracranial atherosclerotic disease, as described. Electronically Signed   By: Jackey Loge D.O.   On: 12/08/21 17:40   CT HEAD CODE STROKE WO CONTRAST  Result Date: 12-08-21 CLINICAL DATA:  Code stroke.  Aphasia EXAM: CT HEAD WITHOUT CONTRAST TECHNIQUE: Contiguous axial images were obtained from the base of the skull through the vertex without intravenous contrast. RADIATION DOSE REDUCTION: This exam was performed according to the departmental dose-optimization program which includes automated exposure control, adjustment of the mA and/or kV according to patient size and/or use of iterative reconstruction technique. COMPARISON:  None Available. FINDINGS: Brain: There is no acute intracranial hemorrhage, extra-axial fluid collection, or acute infarct. Parenchymal volume is normal. The ventricles are normal in size. Gray-white differentiation is preserved. There is no mass lesion.  There is no mass effect or midline shift. Vascular: There is calcification of the bilateral cavernous ICAs. Skull: Normal. Negative for fracture or focal lesion. Sinuses/Orbits: The imaged paranasal sinuses are clear. The imaged globes and orbits are unremarkable. Other: None. ASPECTS [facility] Stroke Program Early CT Score) - Ganglionic level infarction (caudate, lentiform nuclei, internal capsule, insula, M1-M3 cortex): 7 - Supraganglionic infarction (M4-M6 cortex): 3 Total score (0-10 with 10 being normal): 10 IMPRESSION: 1. No acute intracranial pathology. 2. ASPECTS is 10 These results were called by telephone at the time of interpretation on 12/08/2021 at 4:27 pm to provider PHILLIP STAFFORD , who verbally acknowledged these results. Electronically Signed   By: Lesia Hausen M.D.   On: 08-Dec-2021 16:31    PHYSICAL EXAM  Physical Exam  Constitutional: Appears well-developed and well-nourished.   Cardiovascular: Normal rate and regular rhythm.  Respiratory: Effort normal, non-labored breathing  Neuro: Mental  Status: Patient is awake, alert, global aphasia. Not able to repeat or answer orientation questions. Reported to mimic nursing, but not examiner Cranial Nerves: II: Pupils are equal, round, and reactive to light.  Inconsistent blink to threat III,IV, VI: EOMI without ptosis or diploplia.  V: Facial sensation is symmetric to temperature VII: Facial movement is symmetric resting and smiling VIII: Hearing is intact to voice X: Palate elevates symmetrically XI: Shoulder shrug is symmetric. XII: Tongue protrudes midline without atrophy or fasciculations.  Motor: Tone is normal. Bulk is normal.  RUE 4/5 with drift LUE 5/5 RLE  ROM restricted due to thrombectomy site LLE 5/5 Sensory: Withdraws to pain in all extremities, left more than right Deep Tendon Reflexes: 2+ and symmetric in the biceps and patellae.  Plantars: Toes are downgoing bilaterally.  Cerebellar: Unable to complete FNF, HKS No obvious ataxia with movements   ASSESSMENT/PLAN Steve Salazar is a 71 y.o. 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 On arrival to [place] increased from 3 to 6 and patient was taken to IR for a left M2 thrombectomy.  Cardiology consulted for milrinone and medication management. Currently requiring vasopressin and norepinephrine for BP management. Will need oral anticoagulation when medically appropriate given SAH post procedure.   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 ASPECTS 10.    CTA head & neck Occluded proximal left M2 MCA vessel. Post IR CT contrast extraversion and SAH Repeat head CT- Small-volume acute subarachnoid hemorrhage/extravasated contrast along the left cerebral hemisphere has slightly increased  MRI  moderate left MCA infarct Repeat CT in am pendong 2D Echo EF 20%, global hypokinesis, aortic aneurysm 60mm LDL 79 HgbA1c 5.5 VTE prophylaxis - Lovenox 43mEq Eliquis prior to admission, now on No antithrombotic due to post IR SAH. Repeat CT in am, if SAH stable, will start heparin IV per stroke protocol given significant risk of thrombosis Therapy recommendations:  CIR Disposition:  Pending  Post IR SAH Post IR CT contrast extraversion and SAH Repeat head CT- Small-volume acute subarachnoid hemorrhage/extravasated contrast along the left cerebral hemisphere has slightly increased  Repeat CT in am pending BP goal < 160  Hx of Hypertension Hypotension  Home meds: Losartan- hold Per girlfriend, BP at home 90s BP goal 110-140  Requiring norepinephrine and vasopressin CCM on board  Atrial fibrillation  CHF Cardiomyopathy with low EF Home meds: Milrinone IV, Lasix, spironolactone, Eliquis Will need to resume anticoagulation given hx of A-fib and low EF Currently on hold in the setting of SAH, Repeat CT in am, if SAH stable, will start heparin IV per stroke protocol given significant risk of thrombosis Cardiology on board, appreciate help  AKI on CKD 2/3 Creatinine 2.10-1.98-2.51 Likey due to hypotension and IV contrast CCM on board  Other Stroke Risk Factors Advanced Age >/= 67   Other Active Problems AICD placement Gout  Hospital day # 1  Patient seen and examined by NP/APP with MD. MD to update note as needed.   Janine Ores, DNP, FNP-BC [facility] Pager: [phone]  ATTENDING NOTE: I reviewed above note and agree with the assessment and plan. Pt  was seen and examined.   71 year old male with history of cardiomyopathy with low EF, A-fib on Eliquis, AICD placement, CKD presented to ER for acute onset aphasia.  Initially improving in ER with NIH stroke 3, CT no acute abnormality, CT head and neck showed left M2 occlusion.  Transferred to [facility] for  monitoring, symptoms worsen to NIH score 6, status post IR with TICI2c and post IR small SAH.  MRI showed moderate left MCA infarct.  EF 20%, LDL 79, A1c 5.5.  On exam, girlfriend at bedside.  Patient still has significant global aphasia, mild right facial droop, right upper extremity 4/5, right lower extremity decreased withdrawal with pain stimulation.  Etiology for patient stroke likely due to cardiomyopathy with low EF and A-fib even on Eliquis.  Cardiology on board for CHF meds management, appreciate help.  Given SAH, hold off antithrombotic for today.  Repeat CT in a.m. if SAH stable, may consider heparin IV per stroke protocol given high risk of thrombosis.  Currently still low BP, on pressor, BP goal 1 10-1 40.  Creatinine elevated, consistent with AKI, likely due to hypotension and IVF contrast.  CCM on board.  PT/OT pending.  For detailed assessment and plan, please refer to above/below as I have made changes wherever appropriate.   Steve Hawking, MD PhD Stroke Neurology 10/28/2021 8:38 PM  This patient is critically ill due to left MCA stroke with left M2 occlusion, status post IR, hypotension, AKI on CKD, severe cardiomyopathy, A-fib and at significant risk of neurological worsening, death form recurrent stroke, hemorrhagic transformation, worsening ICH, seizure, renal failure, heart failure, cardiogenic shock. This patient's care requires constant monitoring of vital signs, hemodynamics, respiratory and cardiac monitoring, review of multiple databases, neurological assessment, discussion with family, other specialists and medical decision making of high complexity. I spent 50 minutes of neurocritical care time in the care of this patient. I had long discussion with girlfriend at bedside, updated pt current condition, treatment plan and potential prognosis, and answered all the questions.  She expressed understanding and appreciation.  I also discussed with Dr. Tacy Learn.     To contact Stroke  Continuity provider, please refer to [email or web address] After hours, contact General Neurology

## 2021-11-24 NOTE — Progress Notes (Signed)
OT Cancellation Note  Patient Details Name: Steve Salazar MRN: [id number] DOB: [birth year]   Cancelled Treatment:    Reason Eval/Treat Not Completed: Active bedrest orderRN [facility]00 am. Will plan to follow-up later as time permits. Ignacia Palma, OTR/L Acute Re[facility] [phone] Office [phone]    Evette Georges 2021-12-13, 8:17 AM

## 2021-11-24 NOTE — Transfer of Care (Signed)
Immediate Anesthesia Transfer of Care Note  Patient: Steve Salazar  Procedure(s) Performed: IR WITH ANESTHESIA  Patient Location: 4N - ICU  Anesthesia Type:General  Level of Consciousness: pateint uncooperative and confused  Airway & Oxygen Therapy: Patient Spontanous Breathing and Patient connected to nasal cannula oxygen  Post-op Assessment: Report given to RN and Post -op Vital signs reviewed and stable  Post vital signs: Reviewed and stable  Last Vitals:  Vitals Value Taken Time  BP 116/89 11/05/2021 0430  Temp    Pulse 86 11/27/2021 0435  Resp 19 11/08/2021 0435  SpO2 95 % 11/22/2021 0435  Vitals shown include unvalidated device data.  Last Pain:  Vitals:   12/20/21 2326  TempSrc: Oral  PainSc: 0-No pain         Complications: No notable events documented.

## 2021-11-24 NOTE — Progress Notes (Signed)
PT Cancellation Note  Patient Details Name: Steve Salazar MRN: [id number] DOB: [birth year]   Cancelled Treatment:    Reason Eval/Treat Not Completed: Active bedrest order. RN reporting order for [facility] to be flat until 10:00 am. Will plan to follow-up later as time permits.   Raymond Gurney, PT, DPT Acute Rehabilitation Services  Office: [phone]    Jewel Baize 11/10/2021, 7:25 AM

## 2021-11-24 NOTE — Progress Notes (Signed)
During this shift, patient's urine has become darker red. CCM/stroke Mds are aware.

## 2021-11-24 NOTE — Progress Notes (Signed)
Echocardiogram 2D Echocardiogram has been performed.  Warren Lacy Bilaal Leib RDCS 10/29/2021, 11:32 AM

## 2021-11-24 NOTE — Progress Notes (Signed)
Contacted Stroke Team re: pt continued hypotension despite maxxed on 2 pressors.   Dr. Roda Shutters advised to transfer patient from chair to bed due to possible orthostatic hypotension.  Patient transferred to bed.

## 2021-11-24 NOTE — Evaluation (Signed)
Physical Therapy Evaluation Patient Details Name: MARTEZ WEIAND MRN: [id number] DOB: [birth year] Today's Date: 10/29/2021  History of Present Illness  Steve Salazar is an 71 y.o. male admitted to [facility] Nov 29, 2021 with acute aphasia, transfered to [facility] for close observation and possible mechanical thrombectomy of an acute left M2 occlusion. IR: Occlusion of the distal M2/MCA of the branch of inferior division of the MCA. Mechanical thrombectomy performed. There was dissection of a segment of the treated vessel seen containing some thrombus with mild brief extravasation of the contrast. There is delayed flow seen in the dissected vessel. PMHx of Gout, CKD2, nonischemic cardiomyopathy, HFrEF, HTN, PSVT, ICD placement and atrial flutter s/p cardioversion.   Clinical Impression  Pt presents with condition above and deficits mentioned below, see PT Problem List. PTA, he was IND without DME, living with his girlfriend in a 1-level apartment with 3 STE. Currently, pt is primarily showing deficits in communication, R sided attention, and cognition. He may have some mild weakness on his R compared to his L, but appeared [facility]. Due to his R inattention and difficulty following commands, pt required min-modA for guidance with bed mobility, minA for transfers, and modA to ambulate with a RW. Pt would repeatedly run into obstacles on his R side when ambulating and pt demonstrated poor ability to problem-solve how to get unstuck from obstacles even with max cues. He is at high risk for falls and injuries. Pt would greatly benefit from intensive therapy in the AIR setting. Will continue to follow acutely.       Recommendations for follow up therapy are one component of a multi-disciplinary discharge planning process, led by the attending physician.  Recommendations may be updated based on patient status, additional functional criteria and insurance authorization.  Follow Up Recommendations Acute inpatient rehab  (3hours/day)      Assistance Recommended at Discharge Frequent or constant Supervision/Assistance  Patient can return home with the following  A lot of help with walking and/or transfers;A lot of help with bathing/dressing/bathroom;Assistance with cooking/housework;Direct supervision/assist for medications management;Assist for transportation;Direct supervision/assist for financial management;Help with stairs or ramp for entrance    Equipment Recommendations Rolling walker (2 wheels);BSC/3in1  Recommendations for Other Services  Rehab consult    Functional Status Assessment Patient has had a recent decline in their functional status and demonstrates the ability to make significant improvements in function in a reasonable and predictable amount of time.     Precautions / Restrictions Precautions Precautions: Fall;Other (comment) Precaution Comments: R inattention; SBP < 180 Restrictions Weight Bearing Restrictions: No      Mobility  Bed Mobility Overal bed mobility: Needs Assistance Bed Mobility: Supine to Sit, Sit to Supine     Supine to sit: Min assist, HOB elevated Sit to supine: Mod assist, HOB elevated, +2 for safety/equipment   General bed mobility comments: Pt requiring minA to initiate/guide pt to sit up R EOB due to communication/cognition deficits. ModA to return to supine while sitting on R EOB, again due to communication and cognition deficits and R inattention.    Transfers Overall transfer level: Needs assistance Equipment used: Rolling walker (2 wheels) Transfers: Sit to/from Stand Sit to Stand: Min assist, +2 safety/equipment           General transfer comment: MinA for safety to come to stand from EOB, needing visual and tactile cues to initiate.    Ambulation/Gait Ambulation/Gait assistance: Mod assist, +2 safety/equipment Gait Distance (Feet): 115 Feet Assistive device: Rolling walker (2 wheels) Gait Pattern/deviations:  Step-through pattern,  Decreased stride length, Drifts right/left Gait velocity: reduced Gait velocity interpretation: <1.31 ft/sec, indicative of household ambulator   General Gait Details: Pt with slow, mildly unsteady gait using RW, but primarily having deficits with gait due to R inattention. Pt requiring modA to guide RW as pt repeatedly would drift to his R and bump into obstacles. Even when he would and was cued to look to the R to correct, he would turn his head but not problem-solve to move the RW, instead running into the obstacle repeatedly. +2 for safety, RW management, and lines. Pt startled 1x when therapist got his attention on R  Stairs            Wheelchair Mobility    Modified Rankin (Stroke Patients Only) Modified Rankin (Stroke Patients Only) Pre-Morbid Rankin Score: No symptoms Modified Rankin: Moderately severe disability     Balance Overall balance assessment: Needs assistance Sitting-balance support: No upper extremity supported, Feet supported Sitting balance-Leahy Scale: Fair Sitting balance - Comments: Min guard for safety static sitting EOB   Standing balance support: Bilateral upper extremity supported, During functional activity, Reliant on assistive device for balance Standing balance-Leahy Scale: Poor Standing balance comment: Reliant on RW and min-modA                             Pertinent Vitals/Pain Pain Assessment Pain Assessment: Faces Faces Pain Scale: Hurts a little bit Pain Location: L forarm at site of IV Pain Descriptors / Indicators: Grimacing Pain Intervention(s): Limited activity within patient's tolerance, Monitored during session, Repositioned    Home Living Family/patient expects to be discharged to:: Private residence Living Arrangements: Spouse/significant other (girlfriend) Available Help at Discharge: Family (unsure of amount of available assistance as girlfriend works, but she is currently trying to arrange things to assist  him) Type of Home: Apartment Home Access: Stairs to enter Entrance Stairs-Rails: None Entrance Stairs-Number of Steps: 3   Home Layout: One level Home Equipment: None      Prior Function Prior Level of Function : Independent/Modified Independent;Driving             Mobility Comments: No AD. No falls. ADLs Comments: Does not work. Reading glasses PRN.     Hand Dominance   Dominant Hand: Right    Extremity/Trunk Assessment   Upper Extremity Assessment Upper Extremity Assessment: Defer to OT evaluation    Lower Extremity Assessment Lower Extremity Assessment: Difficult to assess due to impaired cognition (R leg appared potentially slightly weaker than L, but was [facility] regardless for tasks performed today, difficult to assess due to communication/cognition deficits.)    Cervical / Trunk Assessment Cervical / Trunk Assessment: Normal  Communication   Communication: Receptive difficulties;Expressive difficulties  Cognition Arousal/Alertness: Awake/alert Behavior During Therapy: Flat affect Overall Cognitive Status: Difficult to assess Area of Impairment: Attention, Following commands, Safety/judgement, Awareness, Problem solving                   Current Attention Level: Sustained   Following Commands: Follows one step commands inconsistently, Follows one step commands with increased time Safety/Judgement: Decreased awareness of safety, Decreased awareness of deficits Awareness: Intellectual Problem Solving: Slow processing, Requires verbal cues, Requires tactile cues, Difficulty sequencing, Decreased initiation General Comments: Pt limited by expressive and receptive language deficits, thus unable to formally assess cognition. However, pt appeared unaware of his R inattention and had poor problem-solving to turn his head to the R to see what  the RW was stuck on, instead repeatedly running RW into the door on the R. Benefits from visual demonstration cues and tactile  cues.        General Comments General comments (skin integrity, edema, etc.): VSS on RA; encouraged pt's girlfriend to sit pt's R to get him to attend to R as long as he is successful    Exercises     Assessment/Plan    PT Assessment Patient needs continued PT services  PT Problem List Decreased strength;Decreased activity tolerance;Decreased balance;Decreased mobility;Decreased cognition;Decreased knowledge of use of DME;Decreased safety awareness       PT Treatment Interventions DME instruction;Gait training;Stair training;Functional mobility training;Therapeutic activities;Therapeutic exercise;Balance training;Neuromuscular re-education;Cognitive remediation;Patient/family education    PT Goals (Current goals can be found in the Care Plan section)  Acute Rehab PT Goals Patient Stated Goal: did not state; girlfriend wants pt to improve PT Goal Formulation: With family Time For Goal Achievement: 12/08/21 Potential to Achieve Goals: Good    Frequency Min 4X/week     Co-evaluation PT/OT/SLP Co-Evaluation/Treatment: Yes Reason for Co-Treatment: Necessary to address cognition/behavior during functional activity;For patient/therapist safety;To address functional/ADL transfers PT goals addressed during session: Mobility/safety with mobility;Balance;Proper use of DME         AM-PAC PT "6 Clicks" Mobility  Outcome Measure Help needed turning from your back to your side while in a flat bed without using bedrails?: A Little Help needed moving from lying on your back to sitting on the side of a flat bed without using bedrails?: A Little Help needed moving to and from a bed to a chair (including a wheelchair)?: A Lot Help needed standing up from a chair using your arms (e.g., wheelchair or bedside chair)?: A Little Help needed to walk in hospital room?: A Lot Help needed climbing 3-5 steps with a railing? : Total 6 Click Score: 14    End of Session Equipment Utilized During  Treatment: Gait belt Activity Tolerance: Patient tolerated treatment well Patient left: in bed;with call bell/phone within reach;with family/visitor present;Other (comment) (with medical personnel performing test and reporting they would put on his bed alarm) Nurse Communication: Mobility status;Other (comment) (R inattention) PT Visit Diagnosis: Unsteadiness on feet (R26.81);Other abnormalities of gait and mobility (R26.89);Difficulty in walking, not elsewhere classified (R26.2);Other symptoms and signs involving the nervous system (R29.898);Muscle weakness (generalized) (M62.81)    Time: [id number] PT Time Calculation (min) (ACUTE ONLY): 23 min   Charges:   PT Evaluation $PT Eval Moderate Complexity: 1 Mod          Moishe Spice, PT, DPT Acute Rehabilitation Services  Office: [phone]   Orvan Falconer 11/26/2021, 11:29 AM

## 2021-11-24 NOTE — Progress Notes (Signed)
Inpatient Rehab Admissions Coordinator Note:   Per therapy recommendations patient was screened for CIR candidacy by Stephania Fragmin, PT. At this time, pt appears to be a potential candidate for CIR. I will place an order for rehab consult for full assessment, per our protocol.  Please contact me any with questions.Estill Dooms, PT, DPT [phone] 11/27/2021 12:33 PM

## 2021-11-24 NOTE — Anesthesia Procedure Notes (Signed)
Procedure Name: Intubation Date/Time: 10/30/2021 1:19 AM  Performed by: Adair Laundry, CRNAPre-anesthesia Checklist: Patient identified, Emergency Drugs available, Suction available and Patient being monitored Patient Re-evaluated:Patient Re-evaluated prior to induction Oxygen Delivery Method: Circle system utilized Preoxygenation: Pre-oxygenation with 100% oxygen Induction Type: IV induction Ventilation: Two handed mask ventilation required Laryngoscope Size: Miller and 2 Grade View: Grade I Tube type: Oral Tube size: 7.5 mm Number of attempts: 2 Airway Equipment and Method: Stylet Placement Confirmation: ETT inserted through vocal cords under direct vision, positive ETCO2 and breath sounds checked- equal and bilateral Secured at: 23 cm Tube secured with: Tape Dental Injury: Teeth and Oropharynx as per pre-operative assessment

## 2021-11-24 NOTE — Sedation Documentation (Signed)
Pt very agitated trying to get out of bed post extubation. MAE well and able to sit up. Not following commands. Stating he has to get something. Bilateral lower extremities cold, unable to find dorsalis pulsed. Dr. Corliss Skains and Dr. Charlsie Merles aware.

## 2021-11-24 NOTE — Consult Note (Addendum)
Cardiology Consultation:   Patient ID: Steve Salazar MRN: [id number]; DOB: 02/15/1951  Admit date: 10/31/2021 Date of Consult: 22-Dec-2021  PCP:  [name], MD   [facility] Providers Cardiologist:  [name], MD  Electrophysiologist:  [name], MD       Patient Profile:   Steve Salazar is a 71 y.o. 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 Otelia Limes.  History of Present Illness:   Steve Salazar is a 71 year old male with medical history as above who presented to [facility] with aphasia found to have L MCA stroke.  Not a candidate for lytics in the setting of eliquis and was observed briefly prior to transfer to our institution for thrombectomy in the setting of progressive deficits.  He underwent a thrombectomy this morning via femoral access with successful retrieval complicated by small target vessel dissection.  We are consulted for assistance with his heart failure management, arrhythmia, and hemodynamics.  He is unable to offer a meaningful history in the setting of persistent aphasia.  His history is obtained from chart review.  He had a recent admission to [facility] in December of 2022 to [facility] following a syncopal event with ICD discharge after recent amiodarone discontinuation.  Device interrogation uncovered VT/VF/SVT with ATP events and a shock for AF RVR.  He was resumed on amiodarone and ultimately underwent an AVNA to prevent recurrent events.  He was discharged on milrinone at 0.2 mcg/kg/min.  Has been doing okay as an outpatient but with persistent AF and underwent a cardioversion in pursuit of symptomatic benefit on 6/28 which was uneventful.  There  were plans in the background for potential LVAD following the outpatient inotrope administration, but he had been avoiding this in hopes he could wean off the milrinone long-term without need for an operative intervention.   Past Medical History:  Diagnosis Date   CKD (chronic kidney disease), stage II    COVID-19 virus infection 03/2019   HFrEF (heart failure with reduced ejection fraction) (HCC)    a. 2011 Echo: EF 25-30%; b. 10/2016 Echo: EF 20-25%; c. 11/2019 Echo: EF 20-25%, mild LVH, Gr2 DD, sev dil LA, mod dil RA, mod MR, mild to mod AI, mod PR, Asc Ao 61mm.   Hypertension    NICM (nonischemic cardiomyopathy) (HCC)    a. 2011 Echo: EF 25-30%; b. 2011 MV: basal to dist inf perfusion defect->atten; c. 10/2016 Echo: EF 20-25%; d. 11/2019 Echo: EF 20-25%; e. 11/2019 MV: No ischemia. Fixed inflat defect, inflat HK, EF 17%; f. 03/2020 Cath: Nl cors. CO/CI 4.07/1.92.   PSVT (paroxysmal supraventricular tachycardia) (HCC)    a. 12/2016 Event monitor: rare, short episodes of SVT.    Past Surgical History:  Procedure Laterality Date   BIV ICD INSERTION CRT-D N/A 05/13/2020   Procedure: BIV ICD INSERTION CRT-D;  Surgeon: [name], MD;  Location: [facility] INVASIVE CV LAB;  Service: Cardiovascular;  Laterality: N/A;   CARDIOVERSION N/A 10/16/2021   Procedure: CARDIOVERSION;  Surgeon: Iran Ouch, MD;  Location: [facility] ORS;  Service: Cardiovascular;  Laterality: N/A;   RIGHT/LEFT HEART CATH AND CORONARY ANGIOGRAPHY Bilateral 04/01/2020   Procedure: RIGHT/LEFT HEART CATH AND CORONARY ANGIOGRAPHY;  Surgeon: [name], MD;  Location: [facility] CV LAB;  Service: Cardiovascular;  Laterality: Bilateral;       Inpatient Medications: Scheduled Meds:   stroke: early stages of recovery book   Does not apply Once   Chlorhexidine Gluconate Cloth  6 each Topical Daily   enoxaparin (LOVENOX) injection  40 mg Subcutaneous Q24H   nitroGLYCERIN       phenylephrine       Continuous Infusions:   sodium chloride     sodium chloride     sodium chloride     ceFAZolin     clevidipine     norepinephrine (LEVOPHED) Adult infusion     phenylephrine (NEO-SYNEPHRINE) Adult infusion     PRN Meds: acetaminophen **OR** acetaminophen (TYLENOL) oral liquid 160 mg/5 mL **OR** acetaminophen, ceFAZolin, nitroGLYCERIN, mouth rinse, phenylephrine, senna-docusate  Allergies:   No Known Allergies  Social History:   Social History   Socioeconomic History   Marital status: Single    Spouse name: Not on file   Number of children: Not on file   Years of education: Not on file   Highest education level: Not on file  Occupational History   Occupation: Full time  Tobacco Use   Smoking status: Never   Smokeless tobacco: Never   Tobacco comments:    Tobaco use-no  Vaping Use   Vaping Use: Never used  Substance and Sexual Activity   Alcohol use: Yes    Alcohol/week: 2.0 standard drinks of alcohol    Types: 2 Cans of beer per week    Comment: ocassional   Drug use: No   Sexual activity: Not on file  Other Topics Concern   Not on file  Social History Narrative   Pt gets regular exercise.   Social Determinants of Health   Financial Resource Strain: Not on file  Food Insecurity: Not on file  Transportation Needs: Not on file  Physical Activity: Not on file  Stress: Not on file  Social Connections: Not on file  Intimate Partner Violence: Not on file    Family History:    Family History  Problem Relation Age of Onset   Heart attack Father        MI     ROS:  Please see the history of present illness.  Unable to obtain; patient aphasic.  Physical Exam/Data:   Vitals:   10/31/2021 0426 11/20/2021 0430 11/16/2021 0445 11/15/2021 0500  BP: (!) 114/93 116/89  105/88  Pulse: 82 88 89 89  Resp: (!) 21 (!) 26 (!) 28 (!) 30  Temp:      TempSrc:      SpO2: 91% 95% 93% 90%  Weight:      Height:        Intake/Output Summary (Last 24 hours) at 11/07/2021 0504 Last data filed at  11/22/2021 0348 Gross per 24 hour  Intake 300 ml  Output 50 ml  Net 250 ml      11/21/2021   11:26 PM 11/12/2021    8:05 PM 10/16/2021    7:13 AM  Last 3 Weights  Weight (lbs) 189 lb 9.5 oz 192 lb 10.9 oz 185 lb  Weight (kg) 86 kg 87.4 kg 83.915 kg     Body mass index is 25.71 kg/m.  General:  Well nourished, well developed, in no acute distress HEENT: normal Neck: Unable to assess (flat after recent femoral angiogram) Vascular: No carotid bruits; Distal pulses 2+ bilaterally Cardiac:  II/VI HSM at apex, gallop present Lungs:  clear to auscultation bilaterally,  no wheezing, rhonchi or rales  Abd: soft, nontender, no hepatomegaly  Ext: no edema Musculoskeletal:  No deformities, BUE and BLE strength normal and equal Skin: warm and dry  Neuro:  Marked aphasia. Psych:  Normal affect   EKG:  The EKG was personally reviewed and demonstrates:  pending Telemetry:  Telemetry was personally reviewed and demonstrates:  atrial fibrillation without ventricular pacing and frequent PVCs with occasional NSVT (3-4 beats)  Relevant CV Studies: TTE 11/17/2021 LVEF 15%, severe MR, dilated RV, LVEDd 7.1 cm Angiogram 04/01/20 right dominant without angiographic disease; normal biventricular filling pressures with fick CI 1.9  Laboratory Data: High Sensitivity Troponin:  No results for input(s): "TROPONINIHS" in the last 720 hours.   Chemistry Recent Labs  Lab 11/02/2021 1617  NA 138  K 3.9  CL 104  CO2 22  GLUCOSE 160*  BUN 35*  CREATININE 2.10*  CALCIUM 9.2  GFRNONAA 33*  ANIONGAP 12    Recent Labs  Lab 10/29/2021 1617  PROT 7.0  ALBUMIN 4.2  AST 53*  ALT 32  ALKPHOS 57  BILITOT 3.9*   Lipids No results for input(s): "CHOL", "TRIG", "HDL", "LABVLDL", "LDLCALC", "CHOLHDL" in the last 168 hours.  Hematology Recent Labs  Lab 11/03/2021 1617  WBC 7.3  RBC 5.28  HGB 15.2  HCT 47.0  MCV 89.0  MCH 28.8  MCHC 32.3  RDW 14.5  PLT 202   Thyroid No results for input(s): "TSH",  "FREET4" in the last 168 hours.  BNPNo results for input(s): "BNP", "PROBNP" in the last 168 hours.  DDimer No results for input(s): "DDIMER" in the last 168 hours.   Radiology/Studies:  CT ANGIO HEAD NECK W WO CM (CODE STROKE)  Result Date: 11/14/2021 CLINICAL DATA:  Neuro deficit, acute, stroke suspected. EXAM: CT ANGIOGRAPHY HEAD AND NECK TECHNIQUE: Multidetector CT imaging of the head and neck was performed using the standard protocol during bolus administration of intravenous contrast. Multiplanar CT image reconstructions and MIPs were obtained to evaluate the vascular anatomy. Carotid stenosis measurements (when applicable) are obtained utilizing NASCET criteria, using the distal internal carotid diameter as the denominator. RADIATION DOSE REDUCTION: This exam was performed according to the departmental dose-optimization program which includes automated exposure control, adjustment of the mA and/or kV according to patient size and/or use of iterative reconstruction technique. CONTRAST:  182mL OMNIPAQUE IOHEXOL 350 MG/ML SOLN COMPARISON:  Noncontrast head CT performed immediately prior 10/28/2021. FINDINGS: CTA NECK FINDINGS Examination limited by suboptimal arterial contrast enhancement. Aortic arch: Common origin of the innominate and left common carotid arteries. Atherosclerotic plaque within the visualized aortic arch and proximal major branch vessels of the neck. Streak and beam hardening artifact arising from a dense left-sided contrast bolus partially obscures the left subclavian artery. Within this limitation, there is no appreciable hemodynamically significant innominate or proximal subclavian artery stenosis. Right carotid system: CCA and ICA patent within the neck without stenosis or significant atherosclerotic disease. Left carotid system: CCA and ICA patent within the neck without stenosis. Minimal atherosclerotic plaque about the carotid bifurcation and within the proximal ICA. Vertebral  arteries: The vertebral arteries appear patent within the neck. However, the vertebral arteries are poorly assessed due to small size and poor arterial contrast enhancement. Additionally, extensive venous reflux limits evaluation of the vertebral arteries within the lower neck. Skeleton: Cervical spondylosis. No acute fracture or aggressive osseous lesion. Other neck: 14 mm right thyroid lobe nodule, not meeting consensus criteria for ultrasound follow-up based on size. No cervical lymphadenopathy. Upper chest: No  consolidation within the imaged lung apices. Review of the MIP images confirms the above findings CTA HEAD FINDINGS Examination limited by suboptimal arterial contrast enhancement. Anterior circulation: The intracranial internal carotid arteries are patent. Atherosclerotic plaque and sites of at least mild stenosis within the intracranial ICAs, bilaterally. The M1 middle cerebral arteries are patent. Occluded proximal left M2 MCA vessel (series 18, image 55) (series 20, image 32). No right M2 proximal branch occlusion is identified. Atherosclerotic irregularity of the M2 and more distal right MCA vessels. The anterior cerebral arteries are patent. Atherosclerotic irregularity of both vessels which is poorly quantified due to relatively small vessel size and poor arterial contrast enhancement. Within described limitations, no intracranial aneurysm is identified. Posterior circulation: The intracranial vertebral arteries are patent. The left vertebral artery is developmentally small, but patent. The intracranial right vertebral artery is patent without definite stenosis. The basilar artery is patent. Mild atherosclerotic irregularity of the basilar artery. The posterior cerebral arteries are patent proximally without high-grade stenosis. Both posterior cerebral arteries appear predominantly fetal in origin. Venous sinuses: Within the limitations of contrast timing, no convincing thrombus. Anatomic variants:  As described. Review of the MIP images confirms the above findings CTA head impression #2 called by telephone at the time of interpretation on 10/28/2021 at 5:05 pm to provider Dr. Scotty Court, Who verbally acknowledged these results. IMPRESSION: CTA neck: 1. Examination limited by poor arterial contrast enhancement. 2. The common carotid and internal carotid arteries are patent within the neck without stenosis. Minimal atherosclerotic plaque on the left. 3. The vertebral arteries appear patent within the neck. However, the vertebral arteries are poorly assessed due to small size and due to poor arterial contrast enhancement. Additionally, prominent venous reflux limits evaluation the vertebral arteries at the level of the lower neck. CTA head: 1. Examination limited by poor arterial contrast timing. 2. Occluded proximal left M2 MCA vessel. Neuro-interventional consultation is recommended. 3. Additional intracranial atherosclerotic disease, as described. Electronically Signed   By: Jackey Loge D.O.   On: 11/17/2021 17:40   CT HEAD CODE STROKE WO CONTRAST  Result Date: 11/19/2021 CLINICAL DATA:  Code stroke.  Aphasia EXAM: CT HEAD WITHOUT CONTRAST TECHNIQUE: Contiguous axial images were obtained from the base of the skull through the vertex without intravenous contrast. RADIATION DOSE REDUCTION: This exam was performed according to the departmental dose-optimization program which includes automated exposure control, adjustment of the mA and/or kV according to patient size and/or use of iterative reconstruction technique. COMPARISON:  None Available. FINDINGS: Brain: There is no acute intracranial hemorrhage, extra-axial fluid collection, or acute infarct. Parenchymal volume is normal. The ventricles are normal in size. Gray-white differentiation is preserved. There is no mass lesion.  There is no mass effect or midline shift. Vascular: There is calcification of the bilateral cavernous ICAs. Skull: Normal. Negative  for fracture or focal lesion. Sinuses/Orbits: The imaged paranasal sinuses are clear. The imaged globes and orbits are unremarkable. Other: None. ASPECTS [facility] Stroke Program Early CT Score) - Ganglionic level infarction (caudate, lentiform nuclei, internal capsule, insula, M1-M3 cortex): 7 - Supraganglionic infarction (M4-M6 cortex): 3 Total score (0-10 with 10 being normal): 10 IMPRESSION: 1. No acute intracranial pathology. 2. ASPECTS is 10 These results were called by telephone at the time of interpretation on 11/11/2021 at 4:27 pm to provider PHILLIP STAFFORD , who verbally acknowledged these results. Electronically Signed   By: Lesia Hausen M.D.   On: 11/19/2021 16:31     Assessment and Plan:   48M with history  of NICM LVEF 15-20%, secondary prevention ICD with CRT, AVNA 12/22 following inappropriate ICD shock, atrial arrhythmias on eliquis and amiodarone, CKD2/3 presented with MCA occlusion now s/p thrombectomy with IR.  He has NHYAD heart failure on home milrinone therapy in limbo with respect to advanced therapies evaluation (LVAD) with [facility] heart failure service.  Unable to obtain a meaningful history right now with his current aphasia in regard to medication use/adherence, though I do note a prior history of discontinuing amiodarone.  Major reason for consultation was his hemodynamics and management of his home milrinone.  He is currently on infusions of norepinephrine and phenylephrine with a desire to maintain a perfusing SBP 120-140 mmHg.  Recommend transitioning off of phenylephrine to avoid pure afterload in the setting of his cardiomyopathy and titrating norepinephrine upwards.  This will provide inotropy as well.  Can hold milrinone for the period of blood pressure augmentation and this can be resumed subsequently to avoid the afterload reducing effects.  Unclear what the stroke resulted from.  I have some suspicion that it was cardioembolic with recent cardioversion and I'm unclear as to  his DOAC adherence.  Given severity of his cardiomyopathy he's at risk for cardioembolic events even on DOAC.  Would recommend reinitiation of anticoagulation as early as feasible post-procedurally.  Contrasted echo to exclude LV thrombus.  Recommendations #Chronic systolic and diastolic heart failure, NHYAD, on home inotropes, due to nonischemic dilated cardiomyopathy - Hold home losartan, spironolactone while trying to get SBP up. - Continue jardiance - Hold home milrinone until blood pressure goal is liberated; can resume at home dose 0.2 mcg/k/gmin at that point. - PRN furosemide for fluid balance even.  #Atrial fibrillation/flutter, AT #VT #Appropriate and inappropriate ICD shocks - Anticoagulate when feasible; heparin fine here ultimate goal to get back to eliquis - I note he has been off of amiodarone.  Avoid amiodarone/rhythm control/DCCV post stroke with risk for a recurrent event if he converts. - ECG pending; prior AVNA but looks like he's conducting something based on the telemetry (AF w/ underlying LBBB and PVCs/NSVTs)  #Acute ischemic stroke, M2, LMCA, s/p thrombectomy c/b small dissection - Increase norepinephrine as necessary to augment systolic blood pressure.  He has engorged IJ lying flat so looks fluid replete.  Once BP adequate, wean off phenylephrine.  Okay to go up on norepinephrine as needed.  If >20 mcg/min norepinephrine, add vasopressin.  Notify us if rates become an issue on higher dose norepi and we can advise (less likely with the AVNA history.   Risk Assessment/Risk Scores:        New York Heart Association (NYHA) Functional Class NYHA Class IV  CHA2DS2-VASc Score = 5      For questions or updates, please contact [facility] Please consult [email or web address] for contact info under    Signed, Delight Hoh, MD  11/06/2021 5:04 AM Agree with plan as outlined.  We will resume milrinone and remaining cardiac medications later (allowing blood pressure to  run higher in the setting of acute CVA).  Reinitiate anticoagulation when okay with neurology.  Watch for volume excess.  We will follow.  Would resume milrinone in AM at 0.2 mcg/kg/min in AM [facility]  Kirk Ruths, MD

## 2021-11-24 NOTE — Progress Notes (Signed)
Patient's sister took all belongings EXCEPT:  Home portable IV pump Slippers Upper dentures

## 2021-11-24 NOTE — Progress Notes (Signed)
Upon arrival patient had an NIH of 6.  2 for commands, 2 for orientation, and 2 for aphasia.  Originally was 3 per [facility] hospital.  Notified dr Otelia Limes who came and assessed patient and agreed with NIH score.  Code IR called, consent obtained, foley placed and taken down to IR suite.  Patient stable throughout.

## 2021-11-24 NOTE — Anesthesia Preprocedure Evaluation (Addendum)
Anesthesia Evaluation  Patient identified by MRN, date of birth, ID band Patient awake    Reviewed: Allergy & Precautions, NPO status , Patient's Chart, lab work & pertinent test results  Airway Mallampati: I  TM Distance: >3 FB Neck ROM: Full    Dental  (+) Upper Dentures   Pulmonary neg pulmonary ROS,    Pulmonary exam normal        Cardiovascular hypertension, +CHF  + Cardiac Defibrillator  Rhythm:Regular Rate:Tachycardia  Nonischemic CM  Echo 08/07/21: 1. The left ventricle is severely dilated in size with normal wall thickness.  2. The left ventricular systolic function is severely decreased, LVEF is visually estimated at 15%.  3. There is thinning and akinesis involving the inferior and inferolateral wall(s).  4. There is moderate to severe mitral valve regurgitation.  5. The left atrium is moderately to severely dilated in size.  6. The right ventricle is mildly dilated in size.    Neuro/Psych CVA negative psych ROS   GI/Hepatic negative GI ROS, Neg liver ROS,   Endo/Other  negative endocrine ROS  Renal/GU negative Renal ROS  negative genitourinary   Musculoskeletal negative musculoskeletal ROS (+)   Abdominal   Peds negative pediatric ROS (+)  Hematology negative hematology ROS (+)   Anesthesia Other Findings   Reproductive/Obstetrics negative OB ROS                            Anesthesia Physical Anesthesia Plan  ASA: 4  Anesthesia Plan: General   Post-op Pain Management: Minimal or no pain anticipated   Induction: Intravenous  PONV Risk Score and Plan: 2 and Ondansetron and Dexamethasone  Airway Management Planned: Oral ETT  Additional Equipment: Arterial line  Intra-op Plan:   Post-operative Plan: Possible Post-op intubation/ventilation  Informed Consent: I have reviewed the patients History and Physical, chart, labs and discussed the procedure  including the risks, benefits and alternatives for the proposed anesthesia with the patient or authorized representative who has indicated his/her understanding and acceptance.     Dental advisory given  Plan Discussed with: CRNA and Anesthesiologist  Anesthesia Plan Comments:         Anesthesia Quick Evaluation

## 2021-11-24 NOTE — TOC CAGE-AID Note (Signed)
Transition of Care [facility]) - CAGE-AID Screening   Patient Details  Name: Steve Salazar MRN: [id number] Date of Birth: 1950/10/21  [facility] CM/SW Contact:    Carley Hammed, LCSWA Phone Number: 11/16/2021, 9:16 AM   Clinical Narrative: Per chart review, pt is disoriented x4 at this time and not appropriate for assessment.   CAGE-AID Screening: Substance Abuse Screening unable to be completed due to: : Patient unable to participate             Substance Abuse Education Offered: No

## 2021-11-24 NOTE — Consult Note (Signed)
NAME:  Steve Salazar, MRN:  [id number], DOB:  01/10/1951, LOS: 1 ADMISSION DATE:  10/29/2021, CONSULTATION DATE: December 01, 2021 REFERRING MD: Dr. 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  Patient remained hypotensive, required initiation of vasopressor, PCCM was consulted for help evaluation and medical management Currently patient has severe expressive aphasia, he is on Levophed 10 mics and vasopressin  Pertinent  Medical History   Past Medical History:  Diagnosis Date   CKD (chronic kidney disease), stage II    COVID-19 virus infection 03/2019   HFrEF (heart failure with reduced ejection fraction) (HCC)    a. 2011 Echo: EF 25-30%; b. 10/2016 Echo: EF 20-25%; c. 11/2019 Echo: EF 20-25%, mild LVH, Gr2 DD, sev dil LA, mod dil RA, mod MR, mild to mod AI, mod PR, Asc Ao 81mm.   Hypertension    NICM (nonischemic cardiomyopathy) (HCC)    a. 2011 Echo: EF 25-30%; b. 2011 MV: basal to dist inf perfusion defect->atten; c. 10/2016 Echo: EF 20-25%; d. 11/2019 Echo: EF 20-25%; e. 11/2019 MV: No ischemia. Fixed inflat defect, inflat HK, EF 17%; f. 03/2020 Cath: Nl cors. CO/CI 4.07/1.92.   PSVT (paroxysmal supraventricular tachycardia) (HCC)    a. 12/2016 Event monitor: rare, short episodes of SVT.     Significant Hospital Events: Including procedures, antibiotic start and stop dates in addition to other pertinent events     Interim History / Subjective:    Objective   Blood pressure 97/79, pulse 91, temperature 98.5 F (36.9 C), temperature source Oral, resp. rate (!) 30, height 6' (1.829 m), weight  86 kg, SpO2 94 %.        Intake/Output Summary (Last 24 hours) at 12/01/2021 1655 Last data filed at 12/01/2021 1600 Gross per 24 hour  Intake 1073.79 ml  Output 500 ml  Net 573.79 ml   Filed Weights   11/10/2021 2326  Weight: 86 kg    Examination: Physical exam: General: Acute on chronically ill-appearing male, lying on the bed HEENT: [place]/AT, eyes anicteric.  moist mucus membranes Neuro: Alert, awake, intermittently following commands, has severe expressive aphasia, antigravity in all 4 extremities Chest: Coarse breath sounds, no wheezes or rhonchi Heart: Irregularly irregular, no murmurs or gallops Abdomen: Soft, nontender, nondistended, bowel sounds present Skin: No rash   Resolved Hospital Problem list     Assessment & Plan:  Acute left MCA stroke s/p thrombectomy Postprocedure small subarachnoid hemorrhage Acute on chronic systolic CHF with cardiogenic shock on vasopressors Chronic atrial fibrillation Acute kidney injury on CKD stage II due to cardiorenal syndrome  Continue neuro watch every hour Stroke team is following Continue secondary stroke prophylaxis Holding antiplatelet and anticoagulation in the setting of small subarachnoid hemorrhage MRI confirmed left MCA territory stroke with small amount of subarachnoid hemorrhage Patient is in cardiogenic shock, requiring vasopressors, currently on Levophed and vasopressin Titrate with map goal 65 Would try 40 mg of Lasix x1, monitor intake and output Heart rate is well controlled Hold anticoagulation for now Patient serum creatinine is around 1.1, now he presented with 2.1, likely due to cardiorenal syndrome in the setting of cardiogenic shock He is on IV vasopressors, will try Lasix Avoid nephrotoxic agents  Closely monitor urine output Best Practice (right click and "Reselect all SmartList Selections" daily)   Diet/type: Regular consistency (see orders) DVT prophylaxis: LMWH GI prophylaxis: N/A Lines: yes and  it is still needed as port Foley:  Yes, and it is still needed Code Status:  full code Last date of multidisciplinary goals of care discussion [Per primary team]  Labs   CBC: Recent Labs  Lab 12/13/21 1617 11/08/2021 0639  WBC 7.3 6.5  NEUTROABS 4.6  --   HGB 15.2 14.3  HCT 47.0 43.9  MCV 89.0 90.1  PLT 202 180    Basic Metabolic Panel: Recent Labs  Lab December 13, 2021 1617 11/18/2021 0639  NA 138  --   K 3.9  --   CL 104  --   CO2 22  --   GLUCOSE 160*  --   BUN 35*  --   CREATININE 2.10* 1.98*  CALCIUM 9.2  --    GFR: Estimated Creatinine Clearance: 37.6 mL/min (A) (by C-G formula based on SCr of 1.98 mg/dL (H)). Recent Labs  Lab 2021-12-13 1617 11/06/2021 0639  WBC 7.3 6.5    Liver Function Tests: Recent Labs  Lab December 13, 2021 1617  AST 53*  ALT 32  ALKPHOS 57  BILITOT 3.9*  PROT 7.0  ALBUMIN 4.2   No results for input(s): "LIPASE", "AMYLASE" in the last 168 hours. No results for input(s): "AMMONIA" in the last 168 hours.  ABG No results found for: "PHART", "PCO2ART", "PO2ART", "HCO3", "TCO2", "ACIDBASEDEF", "O2SAT"   Coagulation Profile: Recent Labs  Lab 12/13/2021 1617  INR 1.2    Cardiac Enzymes: No results for input(s): "CKTOTAL", "CKMB", "CKMBINDEX", "TROPONINI" in the last 168 hours.  HbA1C: Hgb A1c MFr Bld  Date/Time Value Ref Range Status  11/16/2021 06:40 AM 5.5 4.8 - 5.6 % Final    Comment:    (NOTE) Pre diabetes:          5.7%-6.4%  Diabetes:              >6.4%  Glycemic control for   <7.0% adults with diabetes     CBG: Recent Labs  Lab 13-Dec-2021 1615 11/09/2021 0807 11/18/2021 1143 10/31/2021 1549  GLUCAP 175* 158* 108* 106*    Review of Systems:   12 point review of systems significant for complaint mentioned HPI, rest is negative  Past Medical History:  He,  has a past medical history of CKD (chronic kidney disease), stage II, COVID-19 virus infection (03/2019), HFrEF (heart failure with reduced ejection fraction) (HCC),  Hypertension, NICM (nonischemic cardiomyopathy) (HCC), and PSVT (paroxysmal supraventricular tachycardia) (HCC).   Surgical History:   Past Surgical History:  Procedure Laterality Date   BIV ICD INSERTION CRT-D N/A 05/13/2020   Procedure: BIV ICD INSERTION CRT-D;  Surgeon: [name], MD;  Location[facility] INVASIVE CV LAB;  Service: Cardiovascular;  Laterality: N/A;   CARDIOVERSION N/A 10/16/2021   Procedure: CARDIOVERSION;  Surgeon: Iran Ouch, MD;  Location: [facility] ORS;  Service: Cardiovascular;  Laterality: N/A;   RIGHT/LEFT HEART CATH AND CORONARY ANGIOGRAPHY Bilateral 04/01/2020   Procedure: RIGHT/LEFT HEART CATH AND CORONARY ANGIOGRAPHY;  Surgeon: [name], MD;  Location: [facility] INVASIVE CV LAB;  Service: Cardiovascular;  Laterality: Bilateral;     Social History:   reports that he has never smoked. He has never used smokeless tobacco. He reports current alcohol use of about 2.0 standard drinks of alcohol per week. He reports that he does not use drugs.   Family History:  His family  history includes Heart attack in his father.   Allergies No Known Allergies   Home Medications  Prior to Admission medications   Medication Sig Start Date End Date Taking? Authorizing Provider  acetaminophen (TYLENOL) 500 MG tablet Take 1,000 mg by mouth every 6 (six) hours as needed for mild pain or moderate pain.    [provider]  apixaban (ELIQUIS) 5 MG TABS tablet Take 5 mg by mouth 2 (two) times daily. 08/07/21 10/16/21  [provider]  colchicine 0.6 MG tablet  05/25/21   [provider]  diclofenac Sodium (VOLTAREN) 1 % GEL Apply 2 g topically daily as needed (pain). 05/29/21 05/29/22  [provider]  empagliflozin (JARDIANCE) 10 MG TABS tablet Take 1 tablet by mouth daily. 07/07/21   [provider]  famotidine (PEPCID) 20 MG tablet Take 1 tablet (20 mg total) by mouth daily as needed for heartburn or indigestion. 10/09/21   Minna Merritts, MD  furosemide (LASIX) 40 MG tablet Take 40 mg by mouth daily as needed for fluid or edema. 07/04/21   [provider]  losartan (COZAAR) 25 MG tablet Take 25 mg by mouth daily. 06/30/21 06/30/22  [provider]  magnesium oxide (MAG-OX) 400 MG tablet Take 400 mg by mouth daily. 04/26/21 04/26/22  [provider]  Multiple Vitamins-Minerals (CENTRUM ADULTS PO) Take 1 tablet by mouth daily.    [provider]  potassium chloride SA (KLOR-CON M) 20 MEQ tablet Take 20 mEq by mouth daily. 07/16/21   [provider]  predniSONE (DELTASONE) 20 MG tablet Take 20 mg by mouth daily as needed (Gout). 07/10/21 07/10/22  [provider]  spironolactone (ALDACTONE) 25 MG tablet Take 25 mg by mouth daily. 06/30/21   [provider]  tamsulosin (FLOMAX) 0.4 MG CAPS capsule Take 0.4 mg by mouth daily.     [provider]  UNABLE TO FIND Med Name: Continuous IV Medication for Heart. Site: Upper right arm.    [provider]     Critical care time:     Total critical care time: 35 minutes  Performed by: [place] care time was exclusive of separately billable procedures and treating other patients.   Critical care was necessary to treat or prevent imminent or life-threatening deterioration.   Critical care was time spent personally by me on the following activities: development of treatment plan with patient and/or surrogate as well as nursing, discussions with consultants, evaluation of patient's response to treatment, examination of patient, obtaining history from patient or surrogate, ordering and performing treatments and interventions, ordering and review of laboratory studies, ordering and review of radiographic studies, pulse oximetry and re-evaluation of patient's condition.   Jacky Kindle, MD [facility] See Amion for pager If no response to pager, please call [phone] until  7pm After 7pm, Please call E-link [phone]

## 2021-11-25 ENCOUNTER — Inpatient Hospital Stay (HOSPITAL_COMMUNITY): Payer: Medicare PPO

## 2021-11-25 DIAGNOSIS — R570 Cardiogenic shock: Secondary | ICD-10-CM

## 2021-11-25 DIAGNOSIS — I639 Cerebral infarction, unspecified: Secondary | ICD-10-CM

## 2021-11-25 DIAGNOSIS — I5023 Acute on chronic systolic (congestive) heart failure: Secondary | ICD-10-CM | POA: Diagnosis not present

## 2021-11-25 DIAGNOSIS — I5041 Acute combined systolic (congestive) and diastolic (congestive) heart failure: Secondary | ICD-10-CM

## 2021-11-25 DIAGNOSIS — Z7189 Other specified counseling: Secondary | ICD-10-CM | POA: Diagnosis not present

## 2021-11-25 DIAGNOSIS — R4701 Aphasia: Secondary | ICD-10-CM

## 2021-11-25 DIAGNOSIS — I482 Chronic atrial fibrillation, unspecified: Secondary | ICD-10-CM | POA: Diagnosis not present

## 2021-11-25 DIAGNOSIS — Z515 Encounter for palliative care: Secondary | ICD-10-CM | POA: Diagnosis not present

## 2021-11-25 DIAGNOSIS — I63412 Cerebral infarction due to embolism of left middle cerebral artery: Secondary | ICD-10-CM | POA: Diagnosis not present

## 2021-11-25 LAB — COMPREHENSIVE METABOLIC PANEL
ALT: 50 U/L — ABNORMAL HIGH (ref 0–44)
AST: 66 U/L — ABNORMAL HIGH (ref 15–41)
Albumin: 3.4 g/dL — ABNORMAL LOW (ref 3.5–5.0)
Alkaline Phosphatase: 48 U/L (ref 38–126)
Anion gap: 13 (ref 5–15)
BUN: 36 mg/dL — ABNORMAL HIGH (ref 8–23)
CO2: 21 mmol/L — ABNORMAL LOW (ref 22–32)
Calcium: 9 mg/dL (ref 8.9–10.3)
Chloride: 101 mmol/L (ref 98–111)
Creatinine, Ser: 2.88 mg/dL — ABNORMAL HIGH (ref 0.61–1.24)
GFR, Estimated: 23 mL/min — ABNORMAL LOW (ref >60)
Glucose, Bld: 119 mg/dL — ABNORMAL HIGH (ref 70–99)
Potassium: 5.1 mmol/L (ref 3.5–5.1)
Sodium: 135 mmol/L (ref 135–145)
Total Bilirubin: 4.4 mg/dL — ABNORMAL HIGH (ref 0.3–1.2)
Total Protein: 5.6 g/dL — ABNORMAL LOW (ref 6.5–8.1)

## 2021-11-25 LAB — CBC
HCT: 44.4 % (ref 39.0–52.0)
Hemoglobin: 14.7 g/dL (ref 13.0–17.0)
MCH: 29.7 pg (ref 26.0–34.0)
MCHC: 33.1 g/dL (ref 30.0–36.0)
MCV: 89.7 fL (ref 80.0–100.0)
Platelets: 157 10*3/uL (ref 150–400)
RBC: 4.95 MIL/uL (ref 4.22–5.81)
RDW: 14.5 % (ref 11.5–15.5)
WBC: 14.1 10*3/uL — ABNORMAL HIGH (ref 4.0–10.5)
nRBC: 0 % (ref 0.0–0.2)

## 2021-11-25 LAB — COOXEMETRY PANEL
Carboxyhemoglobin: 1.1 % (ref 0.5–1.5)
Carboxyhemoglobin: 2 % — ABNORMAL HIGH (ref 0.5–1.5)
Carboxyhemoglobin: 1.8 % — ABNORMAL HIGH (ref 0.5–1.5)
Methemoglobin: <0.7 % (ref 0.0–1.5)
Methemoglobin: 0.7 % (ref 0.0–1.5)
Methemoglobin: 0.8 % (ref 0.0–1.5)
O2 Saturation: 39.7 %
O2 Saturation: 66.7 %
O2 Saturation: 69.2 %
Total hemoglobin: 14.2 g/dL (ref 12.0–16.0)
Total hemoglobin: 14.1 g/dL (ref 12.0–16.0)
Total hemoglobin: 14.4 g/dL (ref 12.0–16.0)

## 2021-11-25 LAB — GLUCOSE, CAPILLARY
Glucose-Capillary: 126 mg/dL — ABNORMAL HIGH (ref 70–99)
Glucose-Capillary: 126 mg/dL — ABNORMAL HIGH (ref 70–99)
Glucose-Capillary: 93 mg/dL (ref 70–99)
Glucose-Capillary: 105 mg/dL — ABNORMAL HIGH (ref 70–99)
Glucose-Capillary: 140 mg/dL — ABNORMAL HIGH (ref 70–99)

## 2021-11-25 LAB — HEPARIN LEVEL (UNFRACTIONATED): Heparin Unfractionated: 0.4 IU/mL (ref 0.30–0.70)

## 2021-11-25 LAB — APTT: aPTT: 73 seconds — ABNORMAL HIGH (ref 24–36)

## 2021-11-25 MED ORDER — MILRINONE LACTATE IN DEXTROSE 20-5 MG/100ML-% IV SOLN
0.3750 ug/kg/min | INTRAVENOUS | Status: DC
  Administered 2021-11-25 – 2021-11-27 (×5): 0.375 ug/kg/min via INTRAVENOUS
  Filled 2021-11-25 (×5): qty 100

## 2021-11-25 MED ORDER — FENTANYL CITRATE PF 50 MCG/ML IJ SOSY
12.5000 ug | PREFILLED_SYRINGE | Freq: Once | INTRAMUSCULAR | Status: AC
  Administered 2021-11-25: 12.5 ug via INTRAVENOUS
  Filled 2021-11-25: qty 1

## 2021-11-25 MED ORDER — NOREPINEPHRINE 4 MG/250ML-% IV SOLN
0.0000 ug/min | INTRAVENOUS | Status: DC
  Administered 2021-11-25: 10 ug/min via INTRAVENOUS
  Administered 2021-11-25 (×2): 12 ug/min via INTRAVENOUS
  Administered 2021-11-26: 16 ug/min via INTRAVENOUS
  Administered 2021-11-26 (×3): 20 ug/min via INTRAVENOUS
  Administered 2021-11-26: 12 ug/min via INTRAVENOUS
  Administered 2021-11-26 – 2021-11-27 (×3): 20 ug/min via INTRAVENOUS
  Filled 2021-11-25 (×8): qty 250
  Filled 2021-11-25: qty 500
  Filled 2021-11-25: qty 250

## 2021-11-25 MED ORDER — SODIUM CHLORIDE 0.9 % IV SOLN: INTRAVENOUS | Status: DC | PRN

## 2021-11-25 MED ORDER — DEXMEDETOMIDINE HCL IN NACL 400 MCG/100ML IV SOLN
INTRAVENOUS | Status: AC
  Administered 2021-11-25: 0.4 ug/kg/h via INTRAVENOUS
  Filled 2021-11-25: qty 100

## 2021-11-25 MED ORDER — DEXMEDETOMIDINE HCL IN NACL 400 MCG/100ML IV SOLN
0.4000 ug/kg/h | INTRAVENOUS | Status: DC
  Administered 2021-11-25 – 2021-11-26 (×3): 0.8 ug/kg/h via INTRAVENOUS
  Filled 2021-11-25 (×2): qty 100
  Filled 2021-11-25: qty 200

## 2021-11-25 MED ORDER — FUROSEMIDE 10 MG/ML IJ SOLN
80.0000 mg | Freq: Once | INTRAMUSCULAR | Status: AC
  Administered 2021-11-25: 80 mg via INTRAVENOUS
  Filled 2021-11-25: qty 8

## 2021-11-25 MED ORDER — HEPARIN (PORCINE) 25000 UT/250ML-% IV SOLN
1600.0000 [IU]/h | INTRAVENOUS | Status: DC
  Administered 2021-11-25 – 2021-11-26 (×2): 1000 [IU]/h via INTRAVENOUS
  Administered 2021-11-27: 1200 [IU]/h via INTRAVENOUS
  Administered 2021-11-28: 1500 [IU]/h via INTRAVENOUS
  Administered 2021-11-29 (×2): 1600 [IU]/h via INTRAVENOUS
  Filled 2021-11-25 (×6): qty 250

## 2021-11-25 NOTE — Consult Note (Signed)
Advanced Heart Failure Team Consult Note   Primary Physician: [name], MD PCP-Cardiologist:  [name], MD  Reason for Consultation: CHF  HPI:    Steve Salazar is seen today for evaluation of CHF at the request of Dr. Katrinka Blazing.   71 y.o. with long history of nonischemic cardiomyopathy and chronic atrial fibrillation was admitted with acute left MCA CVA.  Patient's cardiomyopathy appears to have been discovered around 2011.  Last cath in 12/21 showed normal coronaries. He has a history of VT with ICD shocks, has St Jude CRT-D device.  He had AV nodal ablation in 12/22.  He has chronic atrial fibrillation, had DCCV in 6/23 that was initially successful but appears to be back in AF this admission.  He is not on amiodarone at home.  Patient is followed by the HF service at [facility] and has been on milrinone 0.2 at home since 12/22 admission due to low output HF.  He has refused LVAD so far.    Patient was admitted on 7/27 with left MCA CVA, he has expressive aphasia and right-sided weakness.  He was taken for thrombectomy and had small SAH after the procedure.  His family does not think that he has actually been taking Eliquis at home. Patient has been off milrinone since admission.  He has developed AKI on CKD stage 3 (baseline around 2, up to 2.88 today). He has been hypotensive and was initially on NE + vasopressin, now just on vasopressin 0.03.  BP has been variable, SBP 80s-90s.  He is awake but confused and does not follow commands, +aphasia.  Co-ox sent this morning was 40%.    Review of Systems: Unable to obtain, aphasic  Home Medications Prior to Admission medications   Medication Sig Start Date End Date Taking? Authorizing Provider  acetaminophen (TYLENOL) 500 MG tablet Take 1,000 mg by mouth every 6 (six) hours as needed for mild pain or moderate pain.    [provider]  apixaban (ELIQUIS) 5 MG TABS tablet Take 5 mg by mouth 2 (two) times daily. 08/07/21 10/16/21   [provider]  colchicine 0.6 MG tablet  05/25/21   [provider]  diclofenac Sodium (VOLTAREN) 1 % GEL Apply 2 g topically daily as needed (pain). 05/29/21 05/29/22  [provider]  empagliflozin (JARDIANCE) 10 MG TABS tablet Take 1 tablet by mouth daily. 07/07/21   [provider]  famotidine (PEPCID) 20 MG tablet Take 1 tablet (20 mg total) by mouth daily as needed for heartburn or indigestion. 10/09/21   [name], MD  furosemide (LASIX) 40 MG tablet Take 40 mg by mouth daily as needed for fluid or edema. 07/04/21   [provider]  losartan (COZAAR) 25 MG tablet Take 25 mg by mouth daily. 06/30/21 06/30/22  [provider]  magnesium oxide (MAG-OX) 400 MG tablet Take 400 mg by mouth daily. 04/26/21 04/26/22  [provider]  Multiple Vitamins-Minerals (CENTRUM ADULTS PO) Take 1 tablet by mouth daily.    [provider]  potassium chloride SA (KLOR-CON M) 20 MEQ tablet Take 20 mEq by mouth daily. 07/16/21   [provider]  predniSONE (DELTASONE) 20 MG tablet Take 20 mg by mouth daily as needed (Gout). 07/10/21 07/10/22  [provider]  spironolactone (ALDACTONE) 25 MG tablet Take 25 mg by mouth daily. 06/30/21   [provider]  tamsulosin (FLOMAX) 0.4 MG CAPS capsule Take 0.4 mg by mouth daily.     [provider]  UNABLE TO FIND Med Name: Continuous IV Medication for Heart. Site: Upper right arm.    [provider]    Past Medical History: Past Medical History:  Diagnosis Date   CKD (chronic kidney disease), stage II    COVID-19 virus infection 03/2019   HFrEF (heart failure with reduced ejection fraction) (HCC)    a. 2011 Echo: EF 25-30%; b. 10/2016 Echo: EF 20-25%; c. 11/2019 Echo: EF 20-25%, mild LVH, Gr2 DD, sev dil LA, mod dil RA, mod MR, mild to mod AI, mod PR, Asc Ao 17mm.   Hypertension    NICM (nonischemic cardiomyopathy) (HCC)    a. 2011 Echo: EF 25-30%; b. 2011  MV: basal to dist inf perfusion defect->atten; c. 10/2016 Echo: EF 20-25%; d. 11/2019 Echo: EF 20-25%; e. 11/2019 MV: No ischemia. Fixed inflat defect, inflat HK, EF 17%; f. 03/2020 Cath: Nl cors. CO/CI 4.07/1.92.   PSVT (paroxysmal supraventricular tachycardia) (HCC)    a. 12/2016 Event monitor: rare, short episodes of SVT.    Past Surgical History: Past Surgical History:  Procedure Laterality Date   BIV ICD INSERTION CRT-D N/A 05/13/2020   Procedure: BIV ICD INSERTION CRT-D;  Surgeon: [name], MD;  Location: [facility] INVASIVE CV LAB;  Service: Cardiovascular;  Laterality: N/A;   CARDIOVERSION N/A 10/16/2021   Procedure: CARDIOVERSION;  Surgeon: Iran Ouch, MD;  Location: [facility] ORS;  Service: Cardiovascular;  Laterality: N/A;   IR PERCUTANEOUS ART THROMBECTOMY/INFUSION INTRACRANIAL INC DIAG ANGIO  11/12/2021   RIGHT/LEFT HEART CATH AND CORONARY ANGIOGRAPHY Bilateral 04/01/2020   Procedure: RIGHT/LEFT HEART CATH AND CORONARY ANGIOGRAPHY;  Surgeon: [name], MD;  Location: [facility] INVASIVE CV LAB;  Service: Cardiovascular;  Laterality: Bilateral;    Family History: Family History  Problem Relation Age of Onset   Heart attack Father        MI    Social History: Social History   Socioeconomic History   Marital status: Single    Spouse name: Not on file   Number of children: Not on file   Years of education: Not on file   Highest education level: Not on file  Occupational History   Occupation: Full time  Tobacco Use   Smoking status: Never   Smokeless tobacco: Never   Tobacco comments:    Tobaco use-no  Vaping Use   Vaping Use: Never used  Substance and Sexual Activity   Alcohol use: Yes    Alcohol/week: 2.0 standard drinks of alcohol    Types: 2 Cans of beer per week    Comment: ocassional   Drug use: No   Sexual activity: Not on file  Other Topics Concern   Not on file  Social History Narrative   Pt gets regular exercise.   Social Determinants of Health    Financial Resource Strain: Not on file  Food Insecurity: Not on file  Transportation Needs: Not on file  Physical Activity: Not on file  Stress: Not on file  Social Connections: Not on file    Allergies:  No Known Allergies  Objective:    Vital Signs:   Temp:  [97.8 F (36.6 C)-98.5 F (36.9 C)] 98.3 F (36.8 C) (07/29 0800) Pulse Rate:  [74-94] 91 (07/29 0900) Resp:  [0-39] 37 (07/29 0900) BP: (49-144)/(39-119) 88/75 (07/29 0900) SpO2:  [84 %-100 %] 95 % (07/29 0900) Last BM Date :  (pta)  Weight change: Filed Weights   12-13-2021 2326  Weight: 86 kg    Intake/Output:  Intake/Output Summary (Last 24 hours) at 11/25/2021 1054 Last data filed at 11/25/2021 0900 Gross per 24 hour  Intake 834.79 ml  Output 330 ml  Net 504.79 ml      Physical Exam    General:  Well appearing. No resp difficulty HEENT: normal Neck: supple. JVP 14+. Carotids 2+ bilat; no bruits. No lymphadenopathy or thyromegaly appreciated. Cor: PMI lateral. Regular rate & rhythm. No rubs, gallops or murmurs. Lungs: clear Abdomen: soft, nontender, nondistended. No hepatosplenomegaly. No bruits or masses. Good bowel sounds. Extremities: no cyanosis, clubbing, rash. 1+ ankle edema.  Neuro: alert & orientedx3, cranial nerves grossly intact. moves all 4 extremities w/o difficulty. Affect pleasant   Telemetry   Atrial fibrillation with BiV pacing (personally reviewed)  EKG    Probably AF with PVCs and BiV pacing (personally reviewed)  Labs   Basic Metabolic Panel: Recent Labs  Lab 11/15/2021 1617 11/22/2021 0639 11/08/2021 1815 11/25/21 0229  NA 138  --  137 135  K 3.9  --  3.9 5.1  CL 104  --  105 101  CO2 22  --  17* 21*  GLUCOSE 160*  --  137* 119*  BUN 35*  --  33* 36*  CREATININE 2.10* 1.98* 2.51* 2.88*  CALCIUM 9.2  --  8.7* 9.0    Liver Function Tests: Recent Labs  Lab 11/02/2021 1617 11/25/21 0229  AST 53* 66*  ALT 32 50*  ALKPHOS 57 48  BILITOT 3.9* 4.4*  PROT 7.0 5.6*   ALBUMIN 4.2 3.4*   No results for input(s): "LIPASE", "AMYLASE" in the last 168 hours. No results for input(s): "AMMONIA" in the last 168 hours.  CBC: Recent Labs  Lab 11/22/2021 1617 11/04/2021 0639 11/25/21 0229  WBC 7.3 6.5 14.1*  NEUTROABS 4.6  --   --   HGB 15.2 14.3 14.7  HCT 47.0 43.9 44.4  MCV 89.0 90.1 89.7  PLT 202 180 157    Cardiac Enzymes: No results for input(s): "CKTOTAL", "CKMB", "CKMBINDEX", "TROPONINI" in the last 168 hours.  BNP: BNP (last 3 results) No results for input(s): "BNP" in the last 8760 hours.  ProBNP (last 3 results) No results for input(s): "PROBNP" in the last 8760 hours.   CBG: Recent Labs  Lab 11/02/2021 1549 11/05/2021 1944 10/28/2021 2327 11/25/21 0316 11/25/21 0759  GLUCAP 106* 118* 124* 126* 126*    Coagulation Studies: Recent Labs    11/10/2021 1617  LABPROT 15.0  INR 1.2     Imaging   CT HEAD WO CONTRAST (5MM)  Result Date: 11/25/2021 CLINICAL DATA:  71 year old male code stroke presentation yesterday left MCA M2 occlusion status post endovascular reperfusion. EXAM: CT HEAD WITHOUT CONTRAST TECHNIQUE: Contiguous axial images were obtained from the base of the skull through the vertex without intravenous contrast. RADIATION DOSE REDUCTION: This exam was performed according to the departmental dose-optimization program which includes automated exposure control, adjustment of the mA and/or kV according to patient size and/or use of iterative reconstruction technique. COMPARISON:  Presentation head CT 11/20/2021. Post NIR head CT 11/15/2021. FINDINGS: Brain: No intraventricular hemorrhage in basilar cisterns remain patent. A small volume of subarachnoid hemorrhage and/or contrast in the left sylvian fissure persists but has regressed since yesterday. Mostly resolved contrast staining in the posterior left hemisphere since yesterday. Segmental roughly 4 cm area of cytotoxic edema in the posterior left MCA territory visible on series 3,  image 18 near the atrium of the left lateral ventricle. No other cytotoxic edema identified. No midline shift. No  ventriculomegaly or ventricular mass effect. Stable gray-white matter differentiation elsewhere. Vascular: Calcified atherosclerosis at the skull base. Skull: Intact. Sinuses/Orbits: Stable mild paranasal sinus mucosal thickening. Tympanic cavities and mastoids remain clear. Other: No acute orbit or scalp soft tissue finding. IMPRESSION: 1. Regressed small volume of left sylvian fissure SAH since yesterday. 2. Roughly 4 cm posterior left MCA territory infarct now visible (series 3, image 18). No malignant hemorrhagic transformation or mass effect. Electronically Signed   By: Genevie Ann M.D.   On: 11/25/2021 05:52   MR BRAIN WO CONTRAST  Result Date: 11/19/2021 CLINICAL DATA:  Stroke, follow-up. EXAM: MRI HEAD WITHOUT CONTRAST TECHNIQUE: Multiplanar, multiecho pulse sequences of the brain and surrounding structures were obtained without intravenous contrast. COMPARISON:  Head CT November 24, 2021, November 23, 2021. FINDINGS: Incomplete study due to patient inability to lie still in the scanner for the duration of the study. Brain: Area of restricted diffusion in the posterior left temporoparietal region and insula with punctate scattered foci of restricted diffusion in the anterior left parietal lobe, corresponding to left MCA posterior division territory infarct. Corresponding increased T2 signal in the cortex in these regions. No significant mass effect. Trace subarachnoid hemorrhage is seen in the left sylvian fissure. No hydrocephalus or mass lesion. Vascular: Major flow voids at the skull base are maintained. Skull and upper cervical spine: Normal marrow signal. Sinuses/Orbits: Negative. Other: None. IMPRESSION: 1. Acute left MCA posterior division territory infarct. No mass effect. 2. Trace subarachnoid hemorrhage in the left sylvian fissure. Electronically Signed   By: Pedro Earls M.D.    On: 11/02/2021 15:55   ECHOCARDIOGRAM COMPLETE  Result Date: 11/15/2021    ECHOCARDIOGRAM REPORT   Patient Name:   CHIRON UMSTED Date of Exam: 11/17/2021 Medical Rec #:  [id number]         Height:       72.0 in Accession #:    [id number]        Weight:       189.6 lb Date of Birth:  February 06, 1951          BSA:          2.083 m Patient Age:    58 years          BP:           92/57 mmHg Patient Gender: M                 HR:           91 bpm. Exam Location:  Inpatient Procedure: 2D Echo, Color Doppler, Cardiac Doppler and 3D Echo Indications:     Stroke i63.9  History:         Patient has prior history of Echocardiogram examinations, most                  recent 12/22/2019. CHF, Defibrillator, Arrythmias:Atrial                  Flutter; Risk Factors:Hypertension.  Sonographer:     Raquel Sarna Senior RDCS Referring Phys:  [phone] ERIC LINDZEN Diagnosing Phys: [place] IMPRESSIONS  1. Left ventricular ejection fraction, by estimation, is 20%. The left ventricle has severely decreased function. The left ventricle demonstrates global hypokinesis. The left ventricular internal cavity size was severely dilated. Left ventricular diastolic function could not be evaluated.  2. Right ventricular systolic function is mildly reduced. The right ventricular size is moderately enlarged. There is moderately elevated pulmonary artery systolic pressure.  3. Left atrial size was severely dilated.  4. Right atrial size was severely dilated.  5. Functional MR. The mitral valve is normal in structure. Moderate to severe mitral valve regurgitation.  6. Tricuspid valve regurgitation is moderate.  7. The aortic valve is tricuspid. Aortic valve regurgitation is mild.  8. Aneurysm of the ascending aorta, measuring 42 mm.  9. The inferior vena cava is dilated in size with <50% respiratory variability, suggesting right atrial pressure of 15 mmHg. Conclusion(s)/Recommendation(s): Compared to prior echo , EF appears mildly worsened and MR is worse.  Recommend TEE when clinically feasible for futher assessment of MR severity. FINDINGS  Left Ventricle: Left ventricular ejection fraction, by estimation, is 20%. The left ventricle has severely decreased function. The left ventricle demonstrates global hypokinesis. The left ventricular internal cavity size was severely dilated. There is no left ventricular hypertrophy. Left ventricular diastolic function could not be evaluated. Right Ventricle: The right ventricular size is moderately enlarged. Right ventricular systolic function is mildly reduced. There is moderately elevated pulmonary artery systolic pressure. The tricuspid regurgitant velocity is 3.09 m/s, and with an assumed right atrial pressure of 15 mmHg, the estimated right ventricular systolic pressure is 53.2 mmHg. Left Atrium: Left atrial size was severely dilated. Right Atrium: Right atrial size was severely dilated. Pericardium: There is no evidence of pericardial effusion. Mitral Valve: Functional MR. The mitral valve is normal in structure. Moderate to severe mitral valve regurgitation. Tricuspid Valve: The tricuspid valve is normal in structure. Tricuspid valve regurgitation is moderate. Aortic Valve: The aortic valve is tricuspid. Aortic valve regurgitation is mild. Pulmonic Valve: Pulmonic valve regurgitation is mild to moderate. Aorta: There is an aneurysm involving the ascending aorta measuring 42 mm. Venous: The inferior vena cava is dilated in size with less than 50% respiratory variability, suggesting right atrial pressure of 15 mmHg. Additional Comments: A device lead is visualized.  LEFT VENTRICLE PLAX 2D LVIDd:         8.30 cm   Diastology LVIDs:         7.50 cm   LV e' medial:  3.15 cm/s LV PW:         0.80 cm   LV e' lateral: 6.38 cm/s LV IVS:        0.70 cm LVOT diam:     2.10 cm LV SV:         24 LV SV Index:   11 LVOT Area:     3.46 cm  3D Volume EF:                          3D EF:        25 %                          LV EDV:        363 ml                          LV ESV:       272 ml                          LV SV:        91 ml RIGHT VENTRICLE RV S prime:     10.90 cm/s TAPSE (M-mode): 1.3 cm LEFT ATRIUM  Index         RIGHT ATRIUM           Index LA diam:        4.90 cm  2.35 cm/m    RA Area:     42.00 cm LA Vol (A2C):   226.0 ml 108.51 ml/m  RA Volume:   176.00 ml 84.50 ml/m LA Vol (A4C):   139.0 ml 66.74 ml/m LA Biplane Vol: 182.0 ml 87.39 ml/m  AORTIC VALVE LVOT Vmax:   52.20 cm/s LVOT Vmean:  39.900 cm/s LVOT VTI:    0.069 m  AORTA Ao Root diam: 3.40 cm Ao Asc diam:  4.20 cm MR Peak grad:    59.0 mmHg    TRICUSPID VALVE MR Mean grad:    35.0 mmHg    TR Peak grad:   38.2 mmHg MR Vmax:         384.00 cm/s  TR Vmax:        309.00 cm/s MR Vmean:        274.0 cm/s MR PISA:         3.08 cm     SHUNTS MR PISA Eff ROA: 27 mm       Systemic VTI:  0.07 m MR PISA Radius:  0.70 cm      Systemic Diam: 2.10 cm Phineas Inches Electronically signed by Phineas Inches Signature Date/Time: 11/04/2021/11:57:08 AM    Final (Updated)      Medications:     Current Medications:  Chlorhexidine Gluconate Cloth  6 each Topical Daily   furosemide  80 mg Intravenous Once   insulin aspart  0-15 Units Subcutaneous Q4H    Infusions:  sodium chloride     sodium chloride     sodium chloride     dexmedetomidine (PRECEDEX) IV infusion 0.4 mcg/kg/hr (11/25/21 0949)   heparin     milrinone     norepinephrine (LEVOPHED) Adult infusion Stopped (11/25/21 0801)   vasopressin 0.03 Units/min (11/25/21 0900)     Assessment/Plan   1. CVA: Acute left MCA CVA, likely cardioembolic.  Has dilated cardiomyopathy, also chronic atrial fibrillation. Echo did not show LV thrombus.  He is now s/p thrombectomy with residual small SAH. Per family, he probably was not taking his Eliquis at home.  He remains aphasic with right-sided weakness.  - Ok per neurology to start him back on heparin gtt today.  Eventually, would resume Eliquis (not a drug failure as  he apparently was not taking it).  2. Atrial fibrillation: Chronic, s/p AV nodal ablation.  Had successful DCCV in 6/23 but now back in AF.  - Restart heparin gtt as above.  3. AKI on CKD stage 3: Baseline creatinine 1.8-2 range, suspect cardiorenal syndrome.  Up to 2.88 now, patient has been hypotensive and off his home inotrope.  - Maintain MAP with NE, restart milrinone.   4. Acute on chronic systolic CHF:  NICM, found as early as 2011.  Last cath in 12/21 with normal coronaries.  Echo this admission with EF 20%, severe LV dilation, severe biatrial enlargement, moderate RV enlargement with mildly decreased systolic function, mod-severe MR, moderate TR, dilated IVC.  Has St Jude CRT-D device.  He has known low output HF, was started on milrinone gtt 0.2 at [facility] in 12/22.  He so far has refused LVAD.  He has been off milrinone since admission, co-ox is now 40% and creatinine rising (up to 2.88 today).  CVP is not set up yet but he is  volume overloaded on exam and IVC dilated on echo.  BP has been low and he is currently on vasopressin 0.03.  - Place arterial line to follow BP.  - Set up CVP.  - Restart milrinone at 0.375 mcg/kg/min and follow co-ox with resumption.  - Would wean off vasopressin and onto norepinephrine.  Use NE to maintain MAP as needed rather than vasopressin.  - After he is on milrinone and NE, will give Lasix 80 mg IV x 1 and follow response.  He will likely need ongoing diuresis.  - There is consideration in notes about barostimulator placement, however not sure this would be very helpful with such advanced HF (inotrope-dependent).  - Patient has refused LVAD in the past, now with large left MCA CVA.  He is not currently an LVAD candidate with the acute CVA (will need to see functional recovery), and given apparent lack of compliance with apixaban would be concerned about his compliance with warfarin if an LVAD were implanted. Would recommend palliative care consultation.  5. Mitral  regurgitation: Moderate-severe functional MR.  LVESD > 7 cm, would not be Mitraclip candidate.   Transfer to 2H.   Length of Stay: 2  Marca Ancona, MD  11/25/2021, 10:54 AM  Advanced Heart Failure Team Pager [phone] (M-F; 7a - 5p)  Please contact [facility] for night-coverage after hours (4p -7a ) and weekends on [email or web address]

## 2021-11-25 NOTE — TOC Initial Note (Signed)
Transition of Care [facility]) - Initial/Assessment Note    Patient Details  Name: Steve Salazar MRN: [id number] Date of Birth: 08-03-1950  Transition of Care [facility]) CM/SW Contact:    Windle Guard, LCSW Phone Number: 11/25/2021, 12:26 PM  Clinical Narrative:                 CSW informed my PT/OT with [facility] that patient does not qualify due to no 24/hour support at home. CSW left HIPPA compliant voicemail with sister. CSW will follow-up when receiving return phone call for consent for referrals.   Expected Discharge Plan: Skilled Nursing Facility Barriers to Discharge: SNF Pending bed offer   Patient Goals and CMS Choice   CMS Medicare.gov Compare Post Acute Care list provided to:: Patient Choice offered to / list presented to : Patient  Expected Discharge Plan and Services Expected Discharge Plan: Skilled Nursing Facility     Post Acute Care Choice: Skilled Nursing Facility Living arrangements for the past 2 months: Single Family Home                                      Prior Living Arrangements/Services Living arrangements for the past 2 months: Single Family Home Lives with:: Other (Comment) Patient language and need for interpreter reviewed:: No Do you feel safe going back to the place where you live?: Yes      Need for Family Participation in Patient Care: Yes (Comment) Care giver support system in place?: Yes (comment)   Criminal Activity/Legal Involvement Pertinent to Current Situation/Hospitalization: No - Comment as needed  Activities of Daily Living      Permission Sought/Granted Permission sought to share information with : Facility Industrial/product designer granted to share information with : Yes, Verbal Permission Granted              Emotional Assessment Appearance:: Appears older than stated age Attitude/Demeanor/Rapport: Unable to Assess Affect (typically observed): Unable to Assess Orientation: : Fluctuating Orientation  (Suspected and/or reported Sundowners) Alcohol / Substance Use: Not Applicable Psych Involvement: No (comment)  Admission diagnosis:  Stroke (cerebrum) [facility]) [I63.9] Patient Active Problem List   Diagnosis Date Noted   Cardiogenic shock (HCC)    Stroke (cerebrum) (HCC) 10/28/2021   Atrial flutter (HCC)    GERD (gastroesophageal reflux disease) 04/15/2021   Syncope 04/15/2021   VT (ventricular tachycardia) (HCC) 04/15/2021   Biventricular ICD (implantable cardioverter-defibrillator) in place 08/23/2020   CHF (congestive heart failure) (HCC) 03/02/2020   Atrial tachycardia (HCC) 09/02/2017   CKD (chronic kidney disease) 07/10/2017   Elevated PSA 07/10/2017   Primary osteoarthritis of left shoulder 07/12/2016   BPH (benign prostatic hyperplasia) 01/13/2016   Family history of diabetes mellitus 09/29/2015   Right inguinal hernia 09/29/2015   Erectile dysfunction 06/01/2015   Chronic systolic CHF (congestive heart failure) (HCC) 08/12/2014   Essential hypertension 01/26/2013   Bradycardia 12/07/2011   Type 2 MI (myocardial infarction) (HCC) 07/22/2009   SVT/ PSVT/ PAT 07/22/2009   DYSPNEA 07/22/2009   PCP:  [name], MD Pharmacy:   [facility] [address] [place], [place] - [address] [facility] [address] [place] [address] Phone: [phone] Fax: [phone]  [id number] [place], [place] - [address] AT [facility] [address] [place] [address] Phone: [phone] Fax: [phone]     Social Determinants of  Health (SDOH) Interventions    Readmission Risk Interventions     No data to display

## 2021-11-25 NOTE — IPAL (Signed)
  Interdisciplinary Goals of Care Family Meeting   Date carried out: 11/25/2021  Location of the meeting: Bedside  Member's involved: Nurse Practitioner and Family Member or next of kin  Durable Power of Attorney or acting medical decision maker: Legally patient son will be the Management consultant, patients sister and brother were updated at bedside   Discussion: I updated patients sister and brother at beside this am. Patients sister is very involved in his care and acts in conjunction with patients son to help make medical decision for patient. The family understands how sick Steve Salazar is at baseline but is hopeful for stabilization. At this time family wishes to continues aggressive measures   Code status: Full Code  Disposition: Continue current acute care  Time spent for the meeting: 25 mins   Steve Turi D. Tiburcio Pea, NP-C [facility] Personal contact information can be found on Amion  11/25/2021, 10:39 AM

## 2021-11-25 NOTE — Consult Note (Signed)
Palliative Care Consult Note                                  Date: 11/25/2021   Patient Name: Steve Salazar  DOB: 29-Dec-1950  MRN: [id number]  Age / Sex: 71 y.o., male  PCP: Marygrace Drought, MD Referring Physician: Candee Furbish, MD  Reason for Consultation: Establishing goals of care  HPI/Patient Profile: 71 y.o. 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 He was admitted on 10/31/2021 with acute left MCA stroke status post thrombectomy, post procedure small subarachnoid hemorrhage, acute on chronic CHF with cardiogenic shock on vasopressors, AKI on CKD, metabolic encephalopathy, shock liver, and others.  PMT was consulted for goals of care conversations.  Past Medical History:  Diagnosis Date   CKD (chronic kidney disease), stage II    COVID-19 virus infection 03/2019   HFrEF (heart failure with reduced ejection fraction) ([place])    a. 2011 Echo: EF 25-30%; b. 10/2016 Echo: EF 20-25%; c. 11/2019 Echo: EF 20-25%, mild LVH, Gr2 DD, sev dil LA, mod dil RA, mod MR, mild to mod AI, mod PR, Asc Ao 49m.   Hypertension    NICM (nonischemic cardiomyopathy) [place]    a. 2011 Echo: EF 25-30%; b. 2011 MV: basal to dist inf perfusion defect->atten; c. 10/2016 Echo: EF 20-25%; d. 11/2019 Echo: EF 20-25%; e. 11/2019 MV: No ischemia. Fixed inflat defect, inflat HK, EF 17%; f. 03/2020 Cath: Nl cors. CO/CI 4.07/1.92.   PSVT (paroxysmal supraventricular tachycardia) ([place]    a. 12/2016 Event monitor: rare, short episodes of SVT.    Subjective:   This NP EWalden Fieldreviewed medical records, received report from team, assessed  the patient and then meet at the patient's bedside to discuss diagnosis, prognosis, GOC, EOL wishes disposition and options.  I met with the patient at the bedside, however he is unable to meaningfully interact.  I spoke with his Sister Steve Sauerand brother Steve Salazar at the bedside.  The patient's son MLegrand Comois on his way to the hospital but not here yet.   Concept of Palliative Care was introduced as specialized medical care for people and their families living with serious illness.  If focuses on providing relief from the symptoms and stress of a serious illness.  The goal is to improve quality of life for both the patient and the family. Values and goals of care important to patient and family were attempted to be elicited.  Created space and opportunity for patient  and family to explore thoughts and feelings regarding current medical situation   Natural trajectory and current clinical status were discussed. Questions and concerns addressed. Patient  encouraged to call with questions or concerns.    Patient/Family Understanding of Illness: They seem to have a firm grasp of his current situation.  They state he has heart failure which is gotten worse.  His kidneys are not doing well and neither is his liver.  He is having fluid retention.  They note that he did have a stroke and clot was removed but had some residual bleeding.  They note that he is aphasic and has a poor mental status,  although he does recognize his name.  After getting her permission I shared further details from the medical record including explaining the difficulty with fluid retention and heart failure on vasopressors needing to support his blood pressure but also need to get fluid off.  We discussed that this is still very early on his clinical course and it may take some time to really see which way he is going to go.  Life Review: The patient retired last year and previously worked at [employer] on the [employer].   He enjoys sports, horseshoes.  His teams over the [employer] [facility] 49 years and [employer].  He has 2 sons and 1 daughter.  Steve Salazar is his son that is primarily involved in his medical care.  Goals: Family: To get stable, they understand he will need rehab.  Today's Discussion: In addition to discussions described above we had further discussion on multiple topics.  We discussed the pathophysiology with heart failure, cardiorenal syndrome, shock liver.  We discussed difficulty in the diuresis in the setting of heart failure requiring vasopressors for support.  We discussed that he is still on heparin, Levophed, milrinone and the purpose of these medications.  We discussed that we will take some time to see if she can recover mentally from everything going on.  They understand this is going to be a long road.  We discussed the IPAL meeting with the critical care nurse practitioner today.  We reviewed their decisions at this point to hope for stabilization and improvement, continue aggressive full scope of care.  They agree that these are their wishes at this point.  I provided emotional and general support through therapeutic listening, empathy, sharing stories, and other techniques.  Answered all questions and addressed all concerns to the best my ability.  Review of Systems  Unable to perform ROS: Mental status change    Objective:   Primary Diagnoses: Present on Admission:  Stroke (cerebrum) [facility])   Physical Exam Vitals and nursing note reviewed.  Constitutional:      General: He is not in acute distress.    Appearance: He is ill-appearing.  HENT:     Head: Normocephalic and atraumatic.  Cardiovascular:     Rate and Rhythm: Normal rate.  Pulmonary:     Effort: Pulmonary effort is normal. No respiratory distress.  Neurological:     Mental Status: He is unresponsive.     Comments: Sleeping soundly     Vital Signs:  BP (!) 85/73   Pulse 88   Temp 97.6 F (36.4 C) (Axillary)   Resp  (!) 26   Ht 6' (1.829 m)   Wt 86 kg   SpO2 99%   BMI 25.71 kg/m   Palliative Assessment/Data: 20-30%    Advanced Care Planning:   Primary Decision Maker: NEXT OF KIN  Code Status/Advance Care Planning: Full code  A discussion was had today regarding advanced directives. Concepts specific to code status, artifical feeding and hydration, continued IV antibiotics and rehospitalization was had.  The difference between a aggressive medical intervention path and a palliative comfort care path for this patient at this time was had.   Decisions/Changes to ACP: None today  Assessment & Plan:   Impression: 71 year old male with multiple chronic comorbidities including known CHF with an EF of 15 to 20% who previously declined LVAD placement.  He was started on home milrinone by [facility] recently, has an AICD.  He presented with profound aphasia and found to have an MCA  CVA.  Status post thrombectomy with post procedure small subarachnoid hemorrhage.  He currently is having acute on chronic CHF requiring Levophed and milrinone due to cardiogenic shock.  Also cardiorenal/AKI, noted to be a likely poor hemodialysis candidate and hopeful to avoid need for hemodialysis.  Also noted elevated LFTs and shock liver.  At this point he essentially has multiorgan dysfunction.  Overall prognosis guarded to poor.  At this point family would like to proceed with full aggressive care given that it is early on in his current acute situation.  SUMMARY OF RECOMMENDATIONS   Continue full scope of care for now Remain full code Allow time for outcomes Hopeful for improvement Further goals of care discussion as his clinical status evolves PMT will continue to follow  Symptom Management:  Per primary team PMT is available to assist as needed  Prognosis:  Unable to determine  Discharge Planning:  To Be Determined   Discussed with: Medical team, nursing team, patient's family    Thank you for allowing [place] to  participate in the care of Steve Salazar PMT will continue to support holistically.  Time Total: 80 min  Greater than 50%  of this time was spent counseling and coordinating care related to the above assessment and plan.  Signed by: Walden Field, NP Palliative Medicine Team  Team Phone # [phone] (Nights/Weekends)  11/25/2021, 4:45 PM

## 2021-11-25 NOTE — Progress Notes (Addendum)
STROKE TEAM PROGRESS NOTE   INTERVAL HISTORY Heart failure team on board- Dr Aundra Dubin consulted. Switch from vaso levo, add milrinone, CCM is putting in an ART line. Lasix once BP is stable. Transfer to [facility] per cardiology   Vitals:   11/25/21 0600 11/25/21 0700 11/25/21 0800 11/25/21 0900  BP: 97/78 90/79 (!) 144/119 (!) 88/75  Pulse: 89 91 89 91  Resp: 11 11 (!) 24 (!) 37  Temp:   98.3 F (36.8 C)   TempSrc:   Axillary   SpO2: 94% 94% 100% 95%  Weight:      Height:       CBC:  Recent Labs  Lab 10/31/2021 1617 11/15/2021 0639 11/25/21 0229  WBC 7.3 6.5 14.1*  NEUTROABS 4.6  --   --   HGB 15.2 14.3 14.7  HCT 47.0 43.9 44.4  MCV 89.0 90.1 89.7  PLT 202 180 [id number]    Basic Metabolic Panel:  Recent Labs  Lab 10/29/2021 1815 11/25/21 0229  NA 137 135  K 3.9 5.1  CL 105 101  CO2 17* 21*  GLUCOSE 137* 119*  BUN 33* 36*  CREATININE 2.51* 2.88*  CALCIUM 8.7* 9.0    Lipid Panel:  Recent Labs  Lab 11/19/2021 0627  CHOL 150  TRIG 44  HDL 62  CHOLHDL 2.4  VLDL 9  LDLCALC 79    HgbA1c:  Recent Labs  Lab 11/08/2021 0640  HGBA1C 5.5    Urine Drug Screen: No results for input(s): "LABOPIA", "COCAINSCRNUR", "LABBENZ", "AMPHETMU", "THCU", "LABBARB" in the last 168 hours.  Alcohol Level  Recent Labs  Lab 11/02/2021 1617  ETH <10     IMAGING past 24 hours CT HEAD WO CONTRAST (5MM)  Result Date: 11/25/2021 CLINICAL DATA:  71 year old male code stroke presentation yesterday left MCA M2 occlusion status post endovascular reperfusion. EXAM: CT HEAD WITHOUT CONTRAST TECHNIQUE: Contiguous axial images were obtained from the base of the skull through the vertex without intravenous contrast. RADIATION DOSE REDUCTION: This exam was performed according to the departmental dose-optimization program which includes automated exposure control, adjustment of the mA and/or kV according to patient size and/or use of iterative reconstruction technique. COMPARISON:  Presentation head CT  11/22/2021. Post NIR head CT 10/30/2021. FINDINGS: Brain: No intraventricular hemorrhage in basilar cisterns remain patent. A small volume of subarachnoid hemorrhage and/or contrast in the left sylvian fissure persists but has regressed since yesterday. Mostly resolved contrast staining in the posterior left hemisphere since yesterday. Segmental roughly 4 cm area of cytotoxic edema in the posterior left MCA territory visible on series 3, image 18 near the atrium of the left lateral ventricle. No other cytotoxic edema identified. No midline shift. No ventriculomegaly or ventricular mass effect. Stable gray-white matter differentiation elsewhere. Vascular: Calcified atherosclerosis at the skull base. Skull: Intact. Sinuses/Orbits: Stable mild paranasal sinus mucosal thickening. Tympanic cavities and mastoids remain clear. Other: No acute orbit or scalp soft tissue finding. IMPRESSION: 1. Regressed small volume of left sylvian fissure SAH since yesterday. 2. Roughly 4 cm posterior left MCA territory infarct now visible (series 3, image 18). No malignant hemorrhagic transformation or mass effect. Electronically Signed   By: Genevie Ann M.D.   On: 11/25/2021 05:52   MR BRAIN WO CONTRAST  Result Date: 11/20/2021 CLINICAL DATA:  Stroke, follow-up. EXAM: MRI HEAD WITHOUT CONTRAST TECHNIQUE: Multiplanar, multiecho pulse sequences of the brain and surrounding structures were obtained without intravenous contrast. COMPARISON:  Head CT November 24, 2021, November 23, 2021. FINDINGS: Incomplete study due to  patient inability to lie still in the scanner for the duration of the study. Brain: Area of restricted diffusion in the posterior left temporoparietal region and insula with punctate scattered foci of restricted diffusion in the anterior left parietal lobe, corresponding to left MCA posterior division territory infarct. Corresponding increased T2 signal in the cortex in these regions. No significant mass effect. Trace subarachnoid  hemorrhage is seen in the left sylvian fissure. No hydrocephalus or mass lesion. Vascular: Major flow voids at the skull base are maintained. Skull and upper cervical spine: Normal marrow signal. Sinuses/Orbits: Negative. Other: None. IMPRESSION: 1. Acute left MCA posterior division territory infarct. No mass effect. 2. Trace subarachnoid hemorrhage in the left sylvian fissure. Electronically Signed   By: Baldemar Lenis M.D.   On: 2021/12/04 15:55   ECHOCARDIOGRAM COMPLETE  Result Date: 12/04/21    ECHOCARDIOGRAM REPORT   Patient Name:   Steve Salazar Date of Exam: 12/04/21 Medical Rec #:  [id number]         Height:       72.0 in Accession #:    [id number]        Weight:       189.6 lb Date of Birth:  [birth year]          BSA:          2.083 m Patient Age:    71 years          BP:           92/57 mmHg Patient Gender: M                 HR:           91 bpm. Exam Location:  Inpatient Procedure: 2D Echo, Color Doppler, Cardiac Doppler and 3D Echo Indications:     Stroke i63.9  History:         Patient has prior history of Echocardiogram examinations, most                  recent 12/22/2019. CHF, Defibrillator, Arrythmias:Atrial                  Flutter; Risk Factors:Hypertension.  Sonographer:     Irving Burton Senior RDCS Referring Phys:  [phone] ERIC LINDZEN Diagnosing Phys: [place] IMPRESSIONS  1. Left ventricular ejection fraction, by estimation, is 20%. The left ventricle has severely decreased function. The left ventricle demonstrates global hypokinesis. The left ventricular internal cavity size was severely dilated. Left ventricular diastolic function could not be evaluated.  2. Right ventricular systolic function is mildly reduced. The right ventricular size is moderately enlarged. There is moderately elevated pulmonary artery systolic pressure.  3. Left atrial size was severely dilated.  4. Right atrial size was severely dilated.  5. Functional MR. The mitral valve is normal in structure.  Moderate to severe mitral valve regurgitation.  6. Tricuspid valve regurgitation is moderate.  7. The aortic valve is tricuspid. Aortic valve regurgitation is mild.  8. Aneurysm of the ascending aorta, measuring 42 mm.  9. The inferior vena cava is dilated in size with <50% respiratory variability, suggesting right atrial pressure of 15 mmHg. Conclusion(s)/Recommendation(s): Compared to prior echo , EF appears mildly worsened and MR is worse. Recommend TEE when clinically feasible for futher assessment of MR severity. FINDINGS  Left Ventricle: Left ventricular ejection fraction, by estimation, is 20%. The left ventricle has severely decreased function. The left ventricle demonstrates global hypokinesis. The left ventricular internal cavity size was  severely dilated. There is no left ventricular hypertrophy. Left ventricular diastolic function could not be evaluated. Right Ventricle: The right ventricular size is moderately enlarged. Right ventricular systolic function is mildly reduced. There is moderately elevated pulmonary artery systolic pressure. The tricuspid regurgitant velocity is 3.09 m/s, and with an assumed right atrial pressure of 15 mmHg, the estimated right ventricular systolic pressure is [id number] mmHg. Left Atrium: Left atrial size was severely dilated. Right Atrium: Right atrial size was severely dilated. Pericardium: There is no evidence of pericardial effusion. Mitral Valve: Functional MR. The mitral valve is normal in structure. Moderate to severe mitral valve regurgitation. Tricuspid Valve: The tricuspid valve is normal in structure. Tricuspid valve regurgitation is moderate. Aortic Valve: The aortic valve is tricuspid. Aortic valve regurgitation is mild. Pulmonic Valve: Pulmonic valve regurgitation is mild to moderate. Aorta: There is an aneurysm involving the ascending aorta measuring 42 mm. Venous: The inferior vena cava is dilated in size with less than 50% respiratory variability, suggesting  right atrial pressure of 15 mmHg. Additional Comments: A device lead is visualized.  LEFT VENTRICLE PLAX 2D LVIDd:         8.30 cm   Diastology LVIDs:         7.50 cm   LV e' medial:  3.15 cm/s LV PW:         0.80 cm   LV e' lateral: 6.38 cm/s LV IVS:        0.70 cm LVOT diam:     2.10 cm LV SV:         24 LV SV Index:   11 LVOT Area:     3.46 cm  3D Volume EF:                          3D EF:        25 %                          LV EDV:       363 ml                          LV ESV:       272 ml                          LV SV:        91 ml RIGHT VENTRICLE RV S prime:     10.90 cm/s TAPSE (M-mode): 1.3 cm LEFT ATRIUM              Index         RIGHT ATRIUM           Index LA diam:        4.90 cm  2.35 cm/m    RA Area:     42.00 cm LA Vol (A2C):   226.0 ml 108.51 ml/m  RA Volume:   176.00 ml 84.50 ml/m LA Vol (A4C):   139.0 ml 66.74 ml/m LA Biplane Vol: 182.0 ml 87.39 ml/m  AORTIC VALVE LVOT Vmax:   52.20 cm/s LVOT Vmean:  39.900 cm/s LVOT VTI:    0.069 m  AORTA Ao Root diam: 3.40 cm Ao Asc diam:  4.20 cm MR Peak grad:    59.0 mmHg    TRICUSPID VALVE MR Mean grad:    35.0 mmHg  TR Peak grad:   38.2 mmHg MR Vmax:         384.00 cm/s  TR Vmax:        309.00 cm/s MR Vmean:        274.0 cm/s MR PISA:         3.08 cm     SHUNTS MR PISA Eff ROA: 27 mm       Systemic VTI:  0.07 m MR PISA Radius:  0.70 cm      Systemic Diam: 2.10 cm Carolan Clines Electronically signed by Carolan Clines Signature Date/Time: 11/11/2021/11:57:08 AM    Final (Updated)     PHYSICAL EXAM  Physical Exam  Constitutional: Appears well-developed and well-nourished.   Cardiovascular: Normal rate and regular rhythm.  Respiratory: Effort normal, non-labored breathing  Neuro: Mental Status: Patient is awake, alert, global aphasia. Not able to repeat or answer orientation questions. Reported to mimic nursing, but not examiner Cranial Nerves: II: Pupils are equal, round, and reactive to light.  Inconsistent blink to threat III,IV, VI: EOMI  without ptosis or diploplia.  V: Facial sensation is symmetric to temperature VII: Slight right facial droop VIII: Hearing is intact to voice X: Palate elevates symmetrically XI: Shoulder shrug is symmetric. XII: Tongue protrudes midline without atrophy or fasciculations.  Motor: Tone is normal. Bulk is normal.  Moving all extremities left more than right  Sensory: Withdraws to pain in all extremities, left more than right Cerebellar: Unable to complete FNF, HKS No obvious ataxia with movements   ASSESSMENT/PLAN Steve Salazar is a 71 y.o. 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 On arrival to [facility] NIH increased from 3 to 6 and patient was taken to IR for a left M2 thrombectomy.  Cardiology consulted for milrinone and medication management. Currently requiring vasopressin and norepinephrine for BP management. Start heparin 7/29 given CT showing regression of SAH.   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 ASPECTS 10.    CTA head & neck Occluded proximal left M2 MCA vessel. Post IR CT contrast extraversion and SAH Repeat head CT- Small-volume acute subarachnoid hemorrhage/extravasated contrast along the left cerebral hemisphere has slightly increased  MRI moderate left MCA infarct Repeat CT in am regressed small volume of left sylvian fissure SAH since yesterday. 2D Echo EF 20%, global hypokinesis, aortic aneurysm 32mm LDL 79 HgbA1c 5.5 VTE prophylaxis - Lovenox Eliquis prior to admission, will start heparin IV per stroke protocol given significant risk of thrombosis Therapy recommendations:  CIR Disposition:  Pending  Post IR SAH Post IR CT contrast extraversion and SAH Repeat head CT- Small-volume  acute subarachnoid hemorrhage/extravasated contrast along the left cerebral hemisphere has slightly increased  Repeat CT in am pending BP goal < 160  Hx of Hypertension Hypotension  Home meds: Losartan- hold Per girlfriend, BP at home 90s BP goal 110-140  Requiring norepinephrine and vasopressin CCM on board  Atrial fibrillation  CHF Cardiomyopathy with low EF Home meds: Milrinone IV, Lasix, spironolactone, Eliquis Will need to resume anticoagulation given hx of A-fib and low EF Currently on hold in the setting of SAH, Repeat CT in am, if SAH stable, will start heparin IV per stroke protocol given significant risk of thrombosis Cardiology on board, appreciate help  AKI on CKD 2/3 Creatinine 2.10-1.98-2.51 Likey due to hypotension and IV contrast CCM on board  Other Stroke Risk Factors Advanced Age >/= 73   Other Active Problems AICD placement Gout  Hospital day # 2  Patient seen and examined by NP/APP with MD. MD to update note as needed.   Janine Ores, DNP, FNP-BC [facility] Pager: [phone]  ATTENDING ATTESTATION:  71 year old with history of cardiomyopathy low ejection fraction echo, chronic kidney disease with left M2 occlusion status post IR resulting in small subarachnoid hemorrhage.  MRI with left MCA infarct.  CCM consulted and they will take on as primary appreciate their help.  Exam as above.  CT this morning is stable and will start heparin drip.  Plan discussed with CCM and they are in agreement.  CCM will continue pressors  Dr. Reeves Forth evaluated pt independently, reviewed imaging, chart, labs. Discussed and formulated plan with the APP. Please see APP note above for details.     This patient is critically ill due to left MCA stroke with left M2 occlusion, status post IR, hypotension, AKI on CKD, severe cardiomyopathy, A-fib and at significant risk of neurological worsening, death form recurrent stroke, hemorrhagic transformation, worsening  ICH, seizure, renal failure, heart failure, cardiogenic shock. This patient's care requires constant monitoring of vital signs, hemodynamics, respiratory and cardiac monitoring, review of multiple databases, neurological assessment, discussion with family, other specialists and medical decision making of high complexity.    To contact Stroke Continuity provider, please refer to [email or web address] After hours, contact General Neurology

## 2021-11-25 NOTE — Progress Notes (Signed)
Inpatient Rehab Admissions:  Inpatient Rehab Consult received.  I met with patient, pt's brother, and pt's sister at the bedside for rehabilitation assessment and to discuss goals and expectations of an inpatient rehab admission.  Pt asleep so spoke with pt's sister [name].  She acknowledged understanding of CIR goals and expectations. She said pt will not have 24/7 support after discharge. Other rehab venues would be more appropriate. TOC made aware.   Signed: Gayland Curry, [place], [place] Admissions Coordinator [phone]

## 2021-11-25 NOTE — Progress Notes (Signed)
Referring Physician(s): Code stroke  Supervising Physician: Julieanne Cotton  Patient Status:  [facility] - In-pt  Chief Complaint: Left M2/MCA occlusion s/p thrombectomy  Subjective:  Patient seen in 4N, RN at bedside who reports patient agitated overnight removing lines/climbing out of bed. He did not sleep at all overnight and was holding his head and grimacing which resolved after receiving a 1 time dose of pain medication. He is now in restraints for agitation. Passed swallow evaluation but hasn't eaten yet today. Not able to follow commands and continues to repeat the same few phrases. No family at bedside today.  Allergies: Patient has no known allergies.  Medications: Prior to Admission medications   Medication Sig Start Date End Date Taking? Authorizing Provider  acetaminophen (TYLENOL) 500 MG tablet Take 1,000 mg by mouth every 6 (six) hours as needed for mild pain or moderate pain.    [provider]  apixaban (ELIQUIS) 5 MG TABS tablet Take 5 mg by mouth 2 (two) times daily. 08/07/21 10/16/21  [provider]  colchicine 0.6 MG tablet  05/25/21   [provider]  diclofenac Sodium (VOLTAREN) 1 % GEL Apply 2 g topically daily as needed (pain). 05/29/21 05/29/22  [provider]  empagliflozin (JARDIANCE) 10 MG TABS tablet Take 1 tablet by mouth daily. 07/07/21   [provider]  famotidine (PEPCID) 20 MG tablet Take 1 tablet (20 mg total) by mouth daily as needed for heartburn or indigestion. 10/09/21   [name], MD  furosemide (LASIX) 40 MG tablet Take 40 mg by mouth daily as needed for fluid or edema. 07/04/21   [provider]  losartan (COZAAR) 25 MG tablet Take 25 mg by mouth daily. 06/30/21 06/30/22  [provider]  magnesium oxide (MAG-OX) 400 MG tablet Take 400 mg by mouth daily. 04/26/21 04/26/22  [provider]  Multiple Vitamins-Minerals (CENTRUM ADULTS PO) Take 1 tablet by mouth daily.     [provider]  potassium chloride SA (KLOR-CON M) 20 MEQ tablet Take 20 mEq by mouth daily. 07/16/21   [provider]  predniSONE (DELTASONE) 20 MG tablet Take 20 mg by mouth daily as needed (Gout). 07/10/21 07/10/22  [provider]  spironolactone (ALDACTONE) 25 MG tablet Take 25 mg by mouth daily. 06/30/21   [provider]  tamsulosin (FLOMAX) 0.4 MG CAPS capsule Take 0.4 mg by mouth daily.     [provider]  UNABLE TO FIND Med Name: Continuous IV Medication for Heart. Site: Upper right arm.    [provider]     Vital Signs: BP (!) 88/75   Pulse 91   Temp 98.3 F (36.8 C) (Axillary)   Resp (!) 37   Ht 6' (1.829 m)   Wt 189 lb 9.5 oz (86 kg)   SpO2 95%   BMI 25.71 kg/m   Physical Exam Vitals and nursing note reviewed.  Constitutional:      General: He is not in acute distress.    Comments: In soft restraints  HENT:     Head: Normocephalic.  Cardiovascular:     Rate and Rhythm: Normal rate.  Pulmonary:     Effort: Pulmonary effort is normal.  Skin:    General: Skin is warm and dry.  Neurological:     Mental Status: He is alert.   Alert, awake, unable to answer orientation questions -- responds to all questions with either "yeah/oh yeah/I know it" Speech does not appear slurred, perseverating. Will acknowledge  a question is asked by looking at examiner and repeating one of the above phrases but does not follow commands to move extremities/blink/stick tongue out/etc PERRL bilaterally EOMs without obvious nystagmus or subjective diplopia. Visual fields - unable to assess No facial asymmetry. Tongue midline - unable to assess Motor power - moves all 4 extremities spontaneously per RN, is in restraints due to agitation. Does not follow commands to assess strength  Imaging: CT HEAD WO CONTRAST (5MM)  Result Date: 11/25/2021 CLINICAL DATA:  71 year old male code stroke presentation yesterday left MCA M2 occlusion  status post endovascular reperfusion. EXAM: CT HEAD WITHOUT CONTRAST TECHNIQUE: Contiguous axial images were obtained from the base of the skull through the vertex without intravenous contrast. RADIATION DOSE REDUCTION: This exam was performed according to the departmental dose-optimization program which includes automated exposure control, adjustment of the mA and/or kV according to patient size and/or use of iterative reconstruction technique. COMPARISON:  Presentation head CT 11/17/2021. Post NIR head CT 11/12/2021. FINDINGS: Brain: No intraventricular hemorrhage in basilar cisterns remain patent. A small volume of subarachnoid hemorrhage and/or contrast in the left sylvian fissure persists but has regressed since yesterday. Mostly resolved contrast staining in the posterior left hemisphere since yesterday. Segmental roughly 4 cm area of cytotoxic edema in the posterior left MCA territory visible on series 3, image 18 near the atrium of the left lateral ventricle. No other cytotoxic edema identified. No midline shift. No ventriculomegaly or ventricular mass effect. Stable gray-white matter differentiation elsewhere. Vascular: Calcified atherosclerosis at the skull base. Skull: Intact. Sinuses/Orbits: Stable mild paranasal sinus mucosal thickening. Tympanic cavities and mastoids remain clear. Other: No acute orbit or scalp soft tissue finding. IMPRESSION: 1. Regressed small volume of left sylvian fissure SAH since yesterday. 2. Roughly 4 cm posterior left MCA territory infarct now visible (series 3, image 18). No malignant hemorrhagic transformation or mass effect. Electronically Signed   By: Genevie Ann M.D.   On: 11/25/2021 05:52   IR PERCUTANEOUS ART THROMBECTOMY/INFUSION INTRACRANIAL INC DIAG ANGIO  Result Date: 11/16/2021 INDICATION: This is a 71 year male with past medical history of gout, chronic kidney disease 2, nonischemic cardiomyopathy, HFrEF, hypertension, PSVT, ICD placement and atrial flutter,  status post cardioversion on Eliquis, presented to the ED for aphasia. Last known normal was 3:40 p.m. on Thursday. NIH stroke scale was 6 CT of the head and neck demonstrated left M2 occlusion. Patient has mRS is 2. EXAM: 1. EMERGENT MEDIUM VESSEL OCCLUSION THROMBOLYISIS ANTERIOR CIRCULATION) 2. MECHANICAL THROMBECTOMY OF THE LEFT DISTAL M2 OCCLUSION(INFERIOR DIVISION) STENT RETRIEVAL TECHNIQUE. MEDICATIONS: 2 g of Ancef was injected intravenously. The antibiotic was administered within 1 hour of the procedure Injection nitroglycerin 75 mcg was injected into the left ICA during the course of the procedure at 25 minutes increments. ANESTHESIA/SEDATION: General anesthesia CONTRAST:  100 mL of Omnipaque 300 FLUOROSCOPY TIME:  Fluoroscopy Time: 31.18 minutes 12 seconds (4367 mGy). COMPLICATIONS: None immediate. TECHNIQUE: Following a full explanation of the procedure along with the potential associated complications, an informed witnessed consent was obtained from the patient's family. The risks of intracranial hemorrhage of 10%, worsening neurological deficit, ventilator dependency, death and inability to revascularize were all reviewed in detail with the patient's family and an informed written consent was obtained. The patient was then put under general anesthesia by the Department of Anesthesiology at [facility]. The right groin was prepped and draped in the usual sterile fashion. Thereafter using modified Seldinger technique, transfemoral access into the right common femoral artery was  obtained without difficulty. Over a 0.035 inch guidewire an 8 [place] by 25 cm pinnacle sheath was inserted. Through this, and also over a 0.035 inch glidewire a 088 inch zoom guide catheter was advanced to the aortic arch region over a 5 French Simmons 2 catheter and selective catheterization of the left common carotid artery followed by biplane carotid angiogram was performed. Then, under roadmap guidance, left ICA was  selected and the [facility] 2 catheter as well as the guide catheter were advanced into the mid to distal ICA. Then, Simmons 2 catheter was removed and biplane DSA cerebral Angiography was performed. Under roadmap guidance, we selected zoom 55 as a distal aspiration catheter, phenom 21 microcatheter and Aristotle 14 microwire were advanced into the intracranial ICA and then micro catheterization of the left MCA inferior division followed by selective catheterization of the occluded M2 branch of the inferior division was performed. The microcatheter was advanced into the M3 segment and gentle DSA angiogram was performed to confirm the location of the catheter. Next, a 4 mm by 40 mm solitaire X device was selected and was deployed spanning the M2 segment of the occluded branch. The zoom 55 aspiration catheter was advanced with its tip proximal to the solitaire device. Aspiration catheter was connected to the penumbra suction system. The solitaire was intercalated in the clot for 3 minutes and then, the thrombectomy device and aspiration catheter were removed under constant aspiration. Post thrombectomy biplane DSA cerebral angiogram was performed demonstrating the treated vessel to be non-patent most likely due to severe spasm. Then, nitroglycerin 25 mcg was injected x3. In view of persistence occlusion of the distal aspect of the vessel, another attempt of Stentriever technique was planned. Under roadmap guidance, and through the zoom 55 distal aspiration catheter, phenom 21 microcatheter and Aristotle 14 microwire were introduced and micro catheterization of the occluded vessel was performed. Then, 3 mm x 4 cm solitaire X device was selected and was gently advanced through the microcatheter. However, during the course of the advancement of the solitaire device within the microcatheter, we noticed contrast opacification and stasis of the contrast in the previously treated vessel, felt to be due to dissection. At this  point in time, the solitaire device was removed. Biplane DSA angiogram was performed through the microcatheter with the catheter tip in the proximal inferior division of the MCA demonstrating dissection of the treated vessel. However, there was antegrade flow of the contrast seen through the branches of the dissected vessel. Then, DSA angiogram was repeated after about 20 minutes confirming continued antegrade flow through the branches emanating from the dissected vessel. There were some non flow-limiting thrombus seen in the dissected vessel as well. Flat panel CT of the head was performed at 20 minute intervals x2 demonstrating small subarachnoid bleed at the left temporoparietal region. Next, the aspiration catheter was removed. Standard biplane DSA angiogram was performed through the guide catheter demonstrating TICI 2C results with continued blood flow through the treated vessel. Next, femoral sheath was partially withdrawn and femoral arteriogram was performed in the RAO projection. The femoral access site was closed using a 6 French Angio-Seal device and hemostasis was achieved. Patient was then extubated and transferred to the neuro ICU in stable condition. FINDINGS: Catheter angiogram demonstrated the common carotid, internal and external carotid arteries have a normal appearance without severe stenoses. Biplane DSA cerebral angiogram demonstrated occlusion of the M2 branch of the inferior division of the left MCA. Post mechanical thrombectomy of the occluded branch of the  inferior division (M2 segment) demonstrated non flow-limiting dissection of the vessel with TICI 2C results. Dyna CT of the head at 20 minutes interval x2 demonstrated a small contained subarachnoid bleed at the left temporoparietal region. The venous aspect of the cerebral circulation has a normal appearance with satisfactory opacification of the superior sagittal, inferior sagittal, bilateral transverse, sigmoid sinuses and proximal  internal jugular veins. IMPRESSION: Occlusion of the branch of the inferior division of the left MCA (distal M2 segment). Mechanical thrombectomy by stentriever technique x1 was performed resulting in TICI 2C recanalization of the occluded vessel. However, there was non flow-limiting dissection with small thrombus formation seen in the treated branch of the inferior division of the MCA with small subarachnoid bleed. PLAN: Admit the patient to ICU. Maintenance the blood pressure between 120 and 140 mm Hg. Recommend CT head and CTA of the head and neck. Electronically Signed   By: Marjo Bicker M.D.   On: 11/09/2021 17:14   MR BRAIN WO CONTRAST  Result Date: 11/22/2021 CLINICAL DATA:  Stroke, follow-up. EXAM: MRI HEAD WITHOUT CONTRAST TECHNIQUE: Multiplanar, multiecho pulse sequences of the brain and surrounding structures were obtained without intravenous contrast. COMPARISON:  Head CT November 24, 2021, 2021-12-14. FINDINGS: Incomplete study due to patient inability to lie still in the scanner for the duration of the study. Brain: Area of restricted diffusion in the posterior left temporoparietal region and insula with punctate scattered foci of restricted diffusion in the anterior left parietal lobe, corresponding to left MCA posterior division territory infarct. Corresponding increased T2 signal in the cortex in these regions. No significant mass effect. Trace subarachnoid hemorrhage is seen in the left sylvian fissure. No hydrocephalus or mass lesion. Vascular: Major flow voids at the skull base are maintained. Skull and upper cervical spine: Normal marrow signal. Sinuses/Orbits: Negative. Other: None. IMPRESSION: 1. Acute left MCA posterior division territory infarct. No mass effect. 2. Trace subarachnoid hemorrhage in the left sylvian fissure. Electronically Signed   By: Baldemar Lenis M.D.   On: 11/18/2021 15:55   ECHOCARDIOGRAM COMPLETE  Result Date: 11/16/2021    ECHOCARDIOGRAM REPORT    Patient Name:   ANDRI PRESTIA Date of Exam: 10/29/2021 Medical Rec #:  [id number]         Height:       72.0 in Accession #:    [id number]        Weight:       189.6 lb Date of Birth:  07-04-1950          BSA:          2.083 m Patient Age:    71 years          BP:           92/57 mmHg Patient Gender: M                 HR:           91 bpm. Exam Location:  Inpatient Procedure: 2D Echo, Color Doppler, Cardiac Doppler and 3D Echo Indications:     Stroke i63.9  History:         Patient has prior history of Echocardiogram examinations, most                  recent 12/22/2019. CHF, Defibrillator, Arrythmias:Atrial                  Flutter; Risk Factors:Hypertension.  Sonographer:     Irving Burton Senior RDCS  Referring Phys:  [id number] ERIC LINDZEN Diagnosing Phys: [place] IMPRESSIONS  1. Left ventricular ejection fraction, by estimation, is 20%. The left ventricle has severely decreased function. The left ventricle demonstrates global hypokinesis. The left ventricular internal cavity size was severely dilated. Left ventricular diastolic function could not be evaluated.  2. Right ventricular systolic function is mildly reduced. The right ventricular size is moderately enlarged. There is moderately elevated pulmonary artery systolic pressure.  3. Left atrial size was severely dilated.  4. Right atrial size was severely dilated.  5. Functional MR. The mitral valve is normal in structure. Moderate to severe mitral valve regurgitation.  6. Tricuspid valve regurgitation is moderate.  7. The aortic valve is tricuspid. Aortic valve regurgitation is mild.  8. Aneurysm of the ascending aorta, measuring 42 mm.  9. The inferior vena cava is dilated in size with <50% respiratory variability, suggesting right atrial pressure of 15 mmHg. Conclusion(s)/Recommendation(s): Compared to prior echo , EF appears mildly worsened and MR is worse. Recommend TEE when clinically feasible for futher assessment of MR severity. FINDINGS  Left Ventricle: Left  ventricular ejection fraction, by estimation, is 20%. The left ventricle has severely decreased function. The left ventricle demonstrates global hypokinesis. The left ventricular internal cavity size was severely dilated. There is no left ventricular hypertrophy. Left ventricular diastolic function could not be evaluated. Right Ventricle: The right ventricular size is moderately enlarged. Right ventricular systolic function is mildly reduced. There is moderately elevated pulmonary artery systolic pressure. The tricuspid regurgitant velocity is 3.09 m/s, and with an assumed right atrial pressure of 15 mmHg, the estimated right ventricular systolic pressure is [id number] mmHg. Left Atrium: Left atrial size was severely dilated. Right Atrium: Right atrial size was severely dilated. Pericardium: There is no evidence of pericardial effusion. Mitral Valve: Functional MR. The mitral valve is normal in structure. Moderate to severe mitral valve regurgitation. Tricuspid Valve: The tricuspid valve is normal in structure. Tricuspid valve regurgitation is moderate. Aortic Valve: The aortic valve is tricuspid. Aortic valve regurgitation is mild. Pulmonic Valve: Pulmonic valve regurgitation is mild to moderate. Aorta: There is an aneurysm involving the ascending aorta measuring 42 mm. Venous: The inferior vena cava is dilated in size with less than 50% respiratory variability, suggesting right atrial pressure of 15 mmHg. Additional Comments: A device lead is visualized.  LEFT VENTRICLE PLAX 2D LVIDd:         8.30 cm   Diastology LVIDs:         7.50 cm   LV e' medial:  3.15 cm/s LV PW:         0.80 cm   LV e' lateral: 6.38 cm/s LV IVS:        0.70 cm LVOT diam:     2.10 cm LV SV:         24 LV SV Index:   11 LVOT Area:     3.46 cm  3D Volume EF:                          3D EF:        25 %                          LV EDV:       363 ml  LV ESV:       272 ml                          LV SV:        91 ml RIGHT  VENTRICLE RV S prime:     10.90 cm/s TAPSE (M-mode): 1.3 cm LEFT ATRIUM              Index         RIGHT ATRIUM           Index LA diam:        4.90 cm  2.35 cm/m    RA Area:     42.00 cm LA Vol (A2C):   226.0 ml 108.51 ml/m  RA Volume:   176.00 ml 84.50 ml/m LA Vol (A4C):   139.0 ml 66.74 ml/m LA Biplane Vol: 182.0 ml 87.39 ml/m  AORTIC VALVE LVOT Vmax:   52.20 cm/s LVOT Vmean:  39.900 cm/s LVOT VTI:    0.069 m  AORTA Ao Root diam: 3.40 cm Ao Asc diam:  4.20 cm MR Peak grad:    59.0 mmHg    TRICUSPID VALVE MR Mean grad:    35.0 mmHg    TR Peak grad:   38.2 mmHg MR Vmax:         384.00 cm/s  TR Vmax:        309.00 cm/s MR Vmean:        274.0 cm/s MR PISA:         3.08 cm     SHUNTS MR PISA Eff ROA: 27 mm       Systemic VTI:  0.07 m MR PISA Radius:  0.70 cm      Systemic Diam: 2.10 cm Phineas Inches Electronically signed by Phineas Inches Signature Date/Time: 11/02/2021/11:57:08 AM    Final (Updated)    CT HEAD WO CONTRAST (5MM)  Result Date: 11/05/2021 CLINICAL DATA:  Provided history: Stroke, follow-up. EXAM: CT HEAD WITHOUT CONTRAST TECHNIQUE: Contiguous axial images were obtained from the base of the skull through the vertex without intravenous contrast. RADIATION DOSE REDUCTION: This exam was performed according to the departmental dose-optimization program which includes automated exposure control, adjustment of the mA and/or kV according to patient size and/or use of iterative reconstruction technique. COMPARISON:  Noncontrast head CT and CT angiogram head/neck 10/30/2021. Postprocedural flat panel head CT 11/16/2021. FINDINGS: Mildly motion degraded exam. Residual circulating intravascular contrast. Brain: No age advanced or lobar predominant parenchymal atrophy. Small-volume subarachnoid hemorrhage/extravasated contrast along the left cerebral hemisphere has slightly increased since the postprocedural flat panel head CT of 11/02/2021. Subarachnoid hemorrhage/extravasated contrast is greatest within the  left sylvian fissure and along the left parietooccipital lobes. No definite acute demarcated cortical infarct is identified. No evidence of an intracranial mass. No midline shift or hydrocephalus. Vascular: Residual circulating intravascular contrast limits evaluation for hyperdense vessels. Atherosclerotic calcifications. Skull: No fracture or aggressive osseous lesion. Sinuses/Orbits: No mass or acute finding within the imaged orbits. Mild mucosal thickening within the bilateral ethmoid, sphenoid and maxillary sinuses. Superimposed small-volume fluid within the bilateral sphenoid sinuses. IMPRESSION: Small-volume acute subarachnoid hemorrhage/extravasated contrast along the left cerebral hemisphere has slightly increased since the postprocedural flat panel head CT of 11/11/2021. No evidence of hydrocephalus. No definite acute infarct is identified. Mild paranasal sinus disease, as described. Electronically Signed   By: Kellie Simmering D.O.   On: 11/27/2021 09:46   CT ANGIO HEAD NECK W WO CM (  CODE STROKE)  Result Date: 11/08/2021 CLINICAL DATA:  Neuro deficit, acute, stroke suspected. EXAM: CT ANGIOGRAPHY HEAD AND NECK TECHNIQUE: Multidetector CT imaging of the head and neck was performed using the standard protocol during bolus administration of intravenous contrast. Multiplanar CT image reconstructions and MIPs were obtained to evaluate the vascular anatomy. Carotid stenosis measurements (when applicable) are obtained utilizing NASCET criteria, using the distal internal carotid diameter as the denominator. RADIATION DOSE REDUCTION: This exam was performed according to the departmental dose-optimization program which includes automated exposure control, adjustment of the mA and/or kV according to patient size and/or use of iterative reconstruction technique. CONTRAST:  179mL OMNIPAQUE IOHEXOL 350 MG/ML SOLN COMPARISON:  Noncontrast head CT performed immediately prior 11/06/2021. FINDINGS: CTA NECK FINDINGS  Examination limited by suboptimal arterial contrast enhancement. Aortic arch: Common origin of the innominate and left common carotid arteries. Atherosclerotic plaque within the visualized aortic arch and proximal major branch vessels of the neck. Streak and beam hardening artifact arising from a dense left-sided contrast bolus partially obscures the left subclavian artery. Within this limitation, there is no appreciable hemodynamically significant innominate or proximal subclavian artery stenosis. Right carotid system: CCA and ICA patent within the neck without stenosis or significant atherosclerotic disease. Left carotid system: CCA and ICA patent within the neck without stenosis. Minimal atherosclerotic plaque about the carotid bifurcation and within the proximal ICA. Vertebral arteries: The vertebral arteries appear patent within the neck. However, the vertebral arteries are poorly assessed due to small size and poor arterial contrast enhancement. Additionally, extensive venous reflux limits evaluation of the vertebral arteries within the lower neck. Skeleton: Cervical spondylosis. No acute fracture or aggressive osseous lesion. Other neck: 14 mm right thyroid lobe nodule, not meeting consensus criteria for ultrasound follow-up based on size. No cervical lymphadenopathy. Upper chest: No consolidation within the imaged lung apices. Review of the MIP images confirms the above findings CTA HEAD FINDINGS Examination limited by suboptimal arterial contrast enhancement. Anterior circulation: The intracranial internal carotid arteries are patent. Atherosclerotic plaque and sites of at least mild stenosis within the intracranial ICAs, bilaterally. The M1 middle cerebral arteries are patent. Occluded proximal left M2 MCA vessel (series 18, image 55) (series 20, image 32). No right M2 proximal branch occlusion is identified. Atherosclerotic irregularity of the M2 and more distal right MCA vessels. The anterior cerebral  arteries are patent. Atherosclerotic irregularity of both vessels which is poorly quantified due to relatively small vessel size and poor arterial contrast enhancement. Within described limitations, no intracranial aneurysm is identified. Posterior circulation: The intracranial vertebral arteries are patent. The left vertebral artery is developmentally small, but patent. The intracranial right vertebral artery is patent without definite stenosis. The basilar artery is patent. Mild atherosclerotic irregularity of the basilar artery. The posterior cerebral arteries are patent proximally without high-grade stenosis. Both posterior cerebral arteries appear predominantly fetal in origin. Venous sinuses: Within the limitations of contrast timing, no convincing thrombus. Anatomic variants: As described. Review of the MIP images confirms the above findings CTA head impression #2 called by telephone at the time of interpretation on 11/10/2021 at 5:05 pm to provider Dr. Joni Fears, Who verbally acknowledged these results. IMPRESSION: CTA neck: 1. Examination limited by poor arterial contrast enhancement. 2. The common carotid and internal carotid arteries are patent within the neck without stenosis. Minimal atherosclerotic plaque on the left. 3. The vertebral arteries appear patent within the neck. However, the vertebral arteries are poorly assessed due to small size and due to poor arterial contrast enhancement.  Additionally, prominent venous reflux limits evaluation the vertebral arteries at the level of the lower neck. CTA head: 1. Examination limited by poor arterial contrast timing. 2. Occluded proximal left M2 MCA vessel. Neuro-interventional consultation is recommended. 3. Additional intracranial atherosclerotic disease, as described. Electronically Signed   By: Jackey Loge D.O.   On: 11/26/2021 17:40   CT HEAD CODE STROKE WO CONTRAST  Result Date: 11/06/2021 CLINICAL DATA:  Code stroke.  Aphasia EXAM: CT HEAD  WITHOUT CONTRAST TECHNIQUE: Contiguous axial images were obtained from the base of the skull through the vertex without intravenous contrast. RADIATION DOSE REDUCTION: This exam was performed according to the departmental dose-optimization program which includes automated exposure control, adjustment of the mA and/or kV according to patient size and/or use of iterative reconstruction technique. COMPARISON:  None Available. FINDINGS: Brain: There is no acute intracranial hemorrhage, extra-axial fluid collection, or acute infarct. Parenchymal volume is normal. The ventricles are normal in size. Gray-white differentiation is preserved. There is no mass lesion.  There is no mass effect or midline shift. Vascular: There is calcification of the bilateral cavernous ICAs. Skull: Normal. Negative for fracture or focal lesion. Sinuses/Orbits: The imaged paranasal sinuses are clear. The imaged globes and orbits are unremarkable. Other: None. ASPECTS [facility] Stroke Program Early CT Score) - Ganglionic level infarction (caudate, lentiform nuclei, internal capsule, insula, M1-M3 cortex): 7 - Supraganglionic infarction (M4-M6 cortex): 3 Total score (0-10 with 10 being normal): 10 IMPRESSION: 1. No acute intracranial pathology. 2. ASPECTS is 10 These results were called by telephone at the time of interpretation on 11/24/2021 at 4:27 pm to provider PHILLIP STAFFORD , who verbally acknowledged these results. Electronically Signed   By: Lesia Hausen M.D.   On: 11/02/2021 16:31    Labs:  CBC: Recent Labs    11/07/2021 1617 Dec 15, 2021 0639 11/25/21 0229  WBC 7.3 6.5 14.1*  HGB 15.2 14.3 14.7  HCT 47.0 43.9 44.4  PLT 202 180 157    COAGS: Recent Labs    11/02/2021 1617  INR 1.2  APTT 30    BMP: Recent Labs    11/15/2021 1617 12/13/2021 0639 12/02/2021 1815 11/25/21 0229  NA 138  --  137 135  K 3.9  --  3.9 5.1  CL 104  --  105 101  CO2 22  --  17* 21*  GLUCOSE 160*  --  137* 119*  BUN 35*  --  33* 36*  CALCIUM  9.2  --  8.7* 9.0  CREATININE 2.10* 1.98* 2.51* 2.88*  GFRNONAA 33* 35* 27* 23*    LIVER FUNCTION TESTS: Recent Labs    11/12/2021 1617 11/25/21 0229  BILITOT 3.9* 4.4*  AST 53* 66*  ALT 32 50*  ALKPHOS 57 48  PROT 7.0 5.6*  ALBUMIN 4.2 3.4*    Assessment and Plan:  71 y/o M who presented to [facility] ED on 11/11/2021 as a code stroke, he was found to have an occlusion of the distal M2/MCA and underwent mechanical thrombectomy that same day with Dr. Nuala Alpha. Patient seen today for follow up.  Patient agitated overnight, now in restraints. Passed swallow evaluation but has not tried PO intake yet due to agitation. ?HA overnight (grimacing and holding head, resolved with 1 dose of pain medication per RN), continues with global aphasia, perseverating, not following commands. Similar neuro exam compared to yesterday. Neurology following with plans to restart heparin gtt and eventually transition to Eliquis  MRI yesterday showed acute left MCA posterior division territory infarct w/o mass  effect and trace SAH in the left sylvian fissure. CT head w/o this morning showed regressed small volume of left sylvian fissure SAH.   No new NIR recommendations at this time, further plans per neurology/CCM.   NIR remains available as needed. Please call with questions or concerns.  Electronically Signed: Joaquim Nam, PA-C 11/25/2021, 10:18 AM   I spent a total of 25 Minutes at the the patient's bedside AND on the patient's hospital floor or unit, greater than 50% of which was counseling/coordinating care for MCA thrombectomy follow up.

## 2021-11-25 NOTE — Progress Notes (Signed)
ANTICOAGULATION CONSULT NOTE - Initial Consult  Pharmacy Consult:  Heparin Indication: Aflutter  No Known Allergies  Patient Measurements: Height: 6' (182.9 cm) Weight: 86 kg (189 lb 9.5 oz) IBW/kg (Calculated) : 77.6 Heparin Dosing Weight: 86 kg  Vital Signs: Temp: 98.3 F (36.8 C) (07/29 0800) Temp Source: Axillary (07/29 0800) BP: 88/75 (07/29 0900) Pulse Rate: 91 (07/29 0900)  Labs: Recent Labs    11/08/2021 1617 11/28/2021 0639 12/22/2021 1815 11/25/21 0229  HGB 15.2 14.3  --  14.7  HCT 47.0 43.9  --  44.4  PLT 202 180  --  157  APTT 30  --   --   --   LABPROT 15.0  --   --   --   INR 1.2  --   --   --   CREATININE 2.10* 1.98* 2.51* 2.88*    Estimated Creatinine Clearance: 25.8 mL/min (A) (by C-G formula based on SCr of 2.88 mg/dL (H)).   Medical History: Past Medical History:  Diagnosis Date   CKD (chronic kidney disease), stage II    COVID-19 virus infection 03/2019   HFrEF (heart failure with reduced ejection fraction) (HCC)    a. 2011 Echo: EF 25-30%; b. 10/2016 Echo: EF 20-25%; c. 11/2019 Echo: EF 20-25%, mild LVH, Gr2 DD, sev dil LA, mod dil RA, mod MR, mild to mod AI, mod PR, Asc Ao 51mm.   Hypertension    NICM (nonischemic cardiomyopathy) (HCC)    a. 2011 Echo: EF 25-30%; b. 2011 MV: basal to dist inf perfusion defect->atten; c. 10/2016 Echo: EF 20-25%; d. 11/2019 Echo: EF 20-25%; e. 11/2019 MV: No ischemia. Fixed inflat defect, inflat HK, EF 17%; f. 03/2020 Cath: Nl cors. CO/CI 4.07/1.92.   PSVT (paroxysmal supraventricular tachycardia) (HCC)    a. 12/2016 Event monitor: rare, short episodes of SVT.    Assessment: 35 YOM with M2 infarct s/p mechanical thrombectomy on 7/28.  CT showed small volume SAH and extravasated contrast.  Repeat CT on 7/29 shows regressed SAH; no hemorrhagic transformation.    Patient has a history of Aflutter on Eliquis PTA, last dose unknown, prior to 7/27.  Pharmacy consulted to dose IV heparin while Eliquis is on hold. He has an AKI  and transaminitis likely due to decreased perfusion; CBC stable.  Will be conservative with heparin initiation due to bleeding risks.  Goal of Therapy:  Heparin level 0.3-0.5 units/ml aPTT 66-85 seconds Monitor platelets by anticoagulation protocol: Yes   Plan:  D/C Lovenox Start IV heparin at 1000 units/hr - no bolus with SAH Check 8 hr heparin level and aPTT Daily heparin level, aPTT and CBC  Steve Salazar, PharmD, BCPS, BCCCP 11/25/2021, 10:48 AM

## 2021-11-25 NOTE — Progress Notes (Signed)
NAME:  Steve Salazar, MRN:  [id number], DOB:  01-19-1951, LOS: 2 ADMISSION DATE:  11/14/2021, CONSULTATION DATE: 12/16/21 REFERRING MD: Dr. 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  Patient remained hypotensive, required initiation of vasopressor, PCCM was consulted for help evaluation and medical management Currently patient has severe expressive aphasia, he is on Levophed 10 mics and vasopressin  Pertinent  Medical History   Past Medical History:  Diagnosis Date   CKD (chronic kidney disease), stage II    COVID-19 virus infection 03/2019   HFrEF (heart failure with reduced ejection fraction) (HCC)    a. 2011 Echo: EF 25-30%; b. 10/2016 Echo: EF 20-25%; c. 11/2019 Echo: EF 20-25%, mild LVH, Gr2 DD, sev dil LA, mod dil RA, mod MR, mild to mod AI, mod PR, Asc Ao 49mm.   Hypertension    NICM (nonischemic cardiomyopathy) (HCC)    a. 2011 Echo: EF 25-30%; b. 2011 MV: basal to dist inf perfusion defect->atten; c. 10/2016 Echo: EF 20-25%; d. 11/2019 Echo: EF 20-25%; e. 11/2019 MV: No ischemia. Fixed inflat defect, inflat HK, EF 17%; f. 03/2020 Cath: Nl cors. CO/CI 4.07/1.92.   PSVT (paroxysmal supraventricular tachycardia) (HCC)    a. 12/2016 Event monitor: rare, short episodes of SVT.    Significant Hospital Events: Including procedures, antibiotic start and stop dates in addition to other pertinent events   7/28 Presented with expressive aphasia, on work-up noted to have acute left MCA stroke with M2 occlusion, underwent mechanical thrombectomy, 7/29 Remains altered with hypotension and cool  extremities. Renal function and LFTs both elevated   Interim History / Subjective:  Confused in bed, able to follow simple commands   Objective   Blood pressure 97/78, pulse 89, temperature 98 F (36.7 C), temperature source Oral, resp. rate 11, height 6' (1.829 m), weight 86 kg, SpO2 94 %.        Intake/Output Summary (Last 24 hours) at 11/25/2021 0920 Last data filed at 11/25/2021 0600 Gross per 24 hour  Intake 737.27 ml  Output 330 ml  Net 407.27 ml    Filed Weights   11/15/2021 2326  Weight: 86 kg    Examination: General: Acute on chronically ill appearing elderly male lying in bed, in NAD HEENT: [place]/AT, MM pink/moist, PERRL,  Neuro: Alert and oriented x21, non-focal  CV: s1s2 regular rate and rhythm, no murmur, rubs, or gallops,  PULM:  Clear to ascultation, no increased work of breathing, no added breath sounds GI: soft, bowel sounds active in all 4 quadrants, non-tender, non-distended Extremities: cold/dry, no edema  Skin: no rashes or lesions  Resolved Hospital Problem list     Assessment & Plan:  Acute left MCA stroke s/p thrombectomy -Etiology cardioembolic in the setting of afib on eliquis and cardiomyopathy with low EF Postprocedure small subarachnoid hemorrhage -MRI confirmed left MCA territory stroke with small amount of subarachnoid hemorrhage P: Primary management per neurology/NSGY Maintain neuro protective measures; goal for eurothermia, euglycemia, eunatermia, normoxia, and PCO2 goal of 35-40 Nutrition and bowel regiment  Seizure precautions  Aspirations precautions   Acute on chronic systolic CHF with cardiogenic shock on vasopressors -ECHO 7/28 with EF 20% with global hypokinesis and severely  dilated LV  -Of note patient was recently admitted to [facility] in December of 2022 following a syncopal event with ICD discharge after recent amiodarone discontinuation.  Device interrogation uncovered VT/VF/SVT with ATP events and a shock for AF RVR.  He was resumed  on amiodarone and ultimately underwent an AVNA to prevent recurrent events.  He was discharged on milrinone at 0.2 mcg/kg/min.  Chronic atrial fibrillation -Anticoagulated at baseline with Eliquis  P: Consult heart failure  Currently on Vasopressin only, will need to resume home inotrope  Obtain COOX Continuous telemetry  Strict intake and output  Daily weight to assess volume status  Closely monitor renal function and electrolytes  Ensure hemodynamic control  Consider initiation of IV heparin drip   Acute kidney injury on CKD stage II due to cardiorenal syndrome -Creatine 1.19 with GFR 60 08/23/2020, Creatinine on admit 2.10 with GFR 33. Creatinine continues to uptrend  NAGMA P: Follow renal function  Monitor urine output Trend Bmet Avoid nephrotoxins Ensure adequate renal perfusion   Acute metabolic encephalopathy  -in the setting of acute CVA and Cardiogenic shock  Plan: Management per neurology  Maintain neuro protective measures; goal for eurothermia, euglycemia, eunatermia, normoxia, and PCO2 goal of 35-40 Nutrition and bowel regiment  Seizure precautions  AEDs per neurology  Aspirations precautions   Elevated LFTs -Presumed secondary to cardiogenic shock P: Avoid hepatotoxins  Trend LFTs   Best Practice (right click and "Reselect all SmartList Selections" daily)   Diet/type: Regular consistency (see orders) DVT prophylaxis: LMWH GI prophylaxis: N/A Lines: yes and it is still needed as port Foley:  Yes, and it is still needed Code Status:  full code Last date of multidisciplinary goals of care discussion: No family at bedside will update   Critical care time: 40 mins   Atilla Zollner D. Tiburcio Pea, NP-C [facility] Personal contact information can be found on Amion  11/25/2021, 10:00 AM

## 2021-11-25 NOTE — Plan of Care (Signed)
  Problem: Cardiovascular: Goal: Ability to achieve and maintain adequate cardiovascular perfusion will improve Outcome: Progressing Goal: Vascular access site(s) Level 0-1 will be maintained Outcome: Progressing   Problem: Education: Goal: Ability to describe self-care measures that may prevent or decrease complications (Diabetes Survival Skills Education) will improve Outcome: Progressing Goal: Individualized Educational Video(s) Outcome: Progressing   Problem: Coping: Goal: Ability to adjust to condition or change in health will improve Outcome: Progressing   Problem: Education: Goal: Knowledge of General Education information will improve Description: Including pain rating scale, medication(s)/side effects and non-pharmacologic comfort measures Outcome: Progressing

## 2021-11-25 NOTE — Progress Notes (Signed)
PCCM Progress Note   Attempted to place A-line in right wrist but was unsuccessful x2 sticks, patient was non-compliant with procedure. Due to safety did not attempt to stick left side. Will transfer to cardiac ICU and once agitation is better controlled can re-attempt procedure.   Bronda Alfred D. Tiburcio Pea, NP-C [facility] Personal contact information can be found on Amion  11/25/2021, 1:53 PM

## 2021-11-25 NOTE — Progress Notes (Signed)
ANTICOAGULATION CONSULT NOTE - Initial Consult  Pharmacy Consult:  Heparin Indication: Aflutter  No Known Allergies  Patient Measurements: Height: 6' (182.9 cm) Weight: 86 kg (189 lb 9.5 oz) IBW/kg (Calculated) : 77.6 Heparin Dosing Weight: 86 kg  Vital Signs: Temp: 97.8 F (36.6 C) (07/29 2005) Temp Source: Axillary (07/29 2005) BP: 87/68 (07/29 2145) Pulse Rate: 88 (07/29 2151)  Labs: Recent Labs    December 04, 2021 1617 11/07/2021 0639 11/09/2021 1815 11/25/21 0229 11/25/21 2145  HGB 15.2 14.3  --  14.7  --   HCT 47.0 43.9  --  44.4  --   PLT 202 180  --  157  --   APTT 30  --   --   --  73*  LABPROT 15.0  --   --   --   --   INR 1.2  --   --   --   --   HEPARINUNFRC  --   --   --   --  0.40  CREATININE 2.10* 1.98* 2.51* 2.88*  --     Estimated Creatinine Clearance: 25.8 mL/min (A) (by C-G formula based on SCr of 2.88 mg/dL (H)).   Medical History: Past Medical History:  Diagnosis Date   CKD (chronic kidney disease), stage II    COVID-19 virus infection 03/2019   HFrEF (heart failure with reduced ejection fraction) (HCC)    a. 2011 Echo: EF 25-30%; b. 10/2016 Echo: EF 20-25%; c. 11/2019 Echo: EF 20-25%, mild LVH, Gr2 DD, sev dil LA, mod dil RA, mod MR, mild to mod AI, mod PR, Asc Ao 19mm.   Hypertension    NICM (nonischemic cardiomyopathy) (HCC)    a. 2011 Echo: EF 25-30%; b. 2011 MV: basal to dist inf perfusion defect->atten; c. 10/2016 Echo: EF 20-25%; d. 11/2019 Echo: EF 20-25%; e. 11/2019 MV: No ischemia. Fixed inflat defect, inflat HK, EF 17%; f. 03/2020 Cath: Nl cors. CO/CI 4.07/1.92.   PSVT (paroxysmal supraventricular tachycardia) (HCC)    a. 12/2016 Event monitor: rare, short episodes of SVT.    Assessment: 66 YOM with M2 infarct s/p mechanical thrombectomy on 7/28.  CT showed small volume SAH and extravasated contrast.  Repeat CT on 7/29 shows regressed SAH; no hemorrhagic transformation.    Patient has a history of Aflutter on Eliquis PTA, last dose unknown,  prior to 7/27. Pharmacy consulted to dose IV heparin while Eliquis is on hold. No initial bolus was given due to increased bleeding risk. He has an AKI and transaminitis likely due to decreased perfusion; CBC stable.    Heparin level 0.40 therapeutic. aPTT 73 within goal range. Current heparin drip rate 1000 units/hr. First level within goal range. Hgb 14.7 and plt 157, stable. No s/sx bleeding noted in patient chart.   Goal of Therapy:  Heparin level 0.3-0.5 units/ml aPTT 66-85 seconds Monitor platelets by anticoagulation protocol: Yes   Plan:  Continue IV heparin at 1000 units/hr  Check 8 hr heparin level with daily AM labs  Daily heparin level and CBC Monitor H&H    Jerry Caras, PharmD PGY1 Pharmacy Resident   11/25/2021 10:24 PM

## 2021-11-25 NOTE — Progress Notes (Signed)
PCCM Progress Note  COOX obtained and revealed O2 saturations of 39.7, will resume Milrinone now  Steve Salazar D. Tiburcio Pea, NP-C [facility] Personal contact information can be found on Amion  11/25/2021, 10:32 AM

## 2021-11-26 DIAGNOSIS — R570 Cardiogenic shock: Secondary | ICD-10-CM | POA: Diagnosis not present

## 2021-11-26 DIAGNOSIS — Z515 Encounter for palliative care: Secondary | ICD-10-CM | POA: Diagnosis not present

## 2021-11-26 DIAGNOSIS — Z7189 Other specified counseling: Secondary | ICD-10-CM | POA: Diagnosis not present

## 2021-11-26 DIAGNOSIS — I639 Cerebral infarction, unspecified: Secondary | ICD-10-CM | POA: Diagnosis not present

## 2021-11-26 DIAGNOSIS — I63412 Cerebral infarction due to embolism of left middle cerebral artery: Secondary | ICD-10-CM | POA: Diagnosis not present

## 2021-11-26 DIAGNOSIS — I482 Chronic atrial fibrillation, unspecified: Secondary | ICD-10-CM | POA: Diagnosis not present

## 2021-11-26 DIAGNOSIS — I5023 Acute on chronic systolic (congestive) heart failure: Secondary | ICD-10-CM | POA: Diagnosis not present

## 2021-11-26 LAB — HEPATIC FUNCTION PANEL
ALT: 59 U/L — ABNORMAL HIGH (ref 0–44)
AST: 60 U/L — ABNORMAL HIGH (ref 15–41)
Albumin: 3.2 g/dL — ABNORMAL LOW (ref 3.5–5.0)
Alkaline Phosphatase: 48 U/L (ref 38–126)
Bilirubin, Direct: 0.6 mg/dL — ABNORMAL HIGH (ref 0.0–0.2)
Indirect Bilirubin: 2.5 mg/dL — ABNORMAL HIGH (ref 0.3–0.9)
Total Bilirubin: 3.1 mg/dL — ABNORMAL HIGH (ref 0.3–1.2)
Total Protein: 5.3 g/dL — ABNORMAL LOW (ref 6.5–8.1)

## 2021-11-26 LAB — BASIC METABOLIC PANEL
Anion gap: 13 (ref 5–15)
BUN: 45 mg/dL — ABNORMAL HIGH (ref 8–23)
CO2: 23 mmol/L (ref 22–32)
Calcium: 8.5 mg/dL — ABNORMAL LOW (ref 8.9–10.3)
Chloride: 97 mmol/L — ABNORMAL LOW (ref 98–111)
Creatinine, Ser: 3.54 mg/dL — ABNORMAL HIGH (ref 0.61–1.24)
GFR, Estimated: 18 mL/min — ABNORMAL LOW (ref >60)
Glucose, Bld: 136 mg/dL — ABNORMAL HIGH (ref 70–99)
Potassium: 4.1 mmol/L (ref 3.5–5.1)
Sodium: 133 mmol/L — ABNORMAL LOW (ref 135–145)

## 2021-11-26 LAB — CBC
HCT: 42.1 % (ref 39.0–52.0)
Hemoglobin: 14.4 g/dL (ref 13.0–17.0)
MCH: 29.8 pg (ref 26.0–34.0)
MCHC: 34.2 g/dL (ref 30.0–36.0)
MCV: 87.2 fL (ref 80.0–100.0)
Platelets: 164 10*3/uL (ref 150–400)
RBC: 4.83 MIL/uL (ref 4.22–5.81)
RDW: 14.1 % (ref 11.5–15.5)
WBC: 11.9 10*3/uL — ABNORMAL HIGH (ref 4.0–10.5)
nRBC: 0 % (ref 0.0–0.2)

## 2021-11-26 LAB — GLUCOSE, CAPILLARY
Glucose-Capillary: 108 mg/dL — ABNORMAL HIGH (ref 70–99)
Glucose-Capillary: 124 mg/dL — ABNORMAL HIGH (ref 70–99)
Glucose-Capillary: 126 mg/dL — ABNORMAL HIGH (ref 70–99)
Glucose-Capillary: 131 mg/dL — ABNORMAL HIGH (ref 70–99)
Glucose-Capillary: 133 mg/dL — ABNORMAL HIGH (ref 70–99)
Glucose-Capillary: 151 mg/dL — ABNORMAL HIGH (ref 70–99)
Glucose-Capillary: 99 mg/dL (ref 70–99)

## 2021-11-26 LAB — COOXEMETRY PANEL
Carboxyhemoglobin: 1.6 % — ABNORMAL HIGH (ref 0.5–1.5)
Carboxyhemoglobin: 1.9 % — ABNORMAL HIGH (ref 0.5–1.5)
Methemoglobin: <0.7 % (ref 0.0–1.5)
Methemoglobin: 0.8 % (ref 0.0–1.5)
O2 Saturation: 56.9 %
O2 Saturation: 66.7 %
Total hemoglobin: 14.7 g/dL (ref 12.0–16.0)
Total hemoglobin: 13.4 g/dL (ref 12.0–16.0)

## 2021-11-26 LAB — MAGNESIUM: Magnesium: 2.3 mg/dL (ref 1.7–2.4)

## 2021-11-26 LAB — HEPARIN LEVEL (UNFRACTIONATED): Heparin Unfractionated: 0.31 IU/mL (ref 0.30–0.70)

## 2021-11-26 LAB — PHOSPHORUS: Phosphorus: 5.5 mg/dL — ABNORMAL HIGH (ref 2.5–4.6)

## 2021-11-26 MED ORDER — FUROSEMIDE 10 MG/ML IJ SOLN
80.0000 mg | Freq: Once | INTRAMUSCULAR | Status: AC
  Administered 2021-11-26: 80 mg via INTRAVENOUS
  Filled 2021-11-26: qty 8

## 2021-11-26 MED ORDER — SODIUM CHLORIDE 0.9 % IV SOLN: INTRAVENOUS | Status: DC | PRN

## 2021-11-26 MED ORDER — AMIODARONE HCL IN DEXTROSE 360-4.14 MG/200ML-% IV SOLN
30.0000 mg/h | INTRAVENOUS | Status: DC
  Administered 2021-11-26: 30 mg/h via INTRAVENOUS
  Administered 2021-11-27: 60 mg/h via INTRAVENOUS
  Administered 2021-11-27 – 2021-11-28 (×2): 30 mg/h via INTRAVENOUS
  Administered 2021-11-28 (×2): 60 mg/h via INTRAVENOUS
  Administered 2021-11-29 – 2021-11-30 (×3): 30 mg/h via INTRAVENOUS
  Filled 2021-11-26 (×9): qty 200

## 2021-11-26 MED ORDER — AMIODARONE HCL IN DEXTROSE 360-4.14 MG/200ML-% IV SOLN
60.0000 mg/h | INTRAVENOUS | Status: AC
  Administered 2021-11-26: 60 mg/h via INTRAVENOUS
  Filled 2021-11-26 (×2): qty 200

## 2021-11-26 NOTE — Progress Notes (Signed)
ANTICOAGULATION CONSULT NOTE  Pharmacy Consult:  Heparin Indication: Aflutter  No Known Allergies  Patient Measurements: Height: 6' (182.9 cm) Weight: 87.1 kg (192 lb 0.3 oz) IBW/kg (Calculated) : 77.6 Heparin Dosing Weight: 86 kg  Vital Signs: Temp: 97.9 F (36.6 C) (07/30 0300) Temp Source: Axillary (07/30 0300) BP: 108/83 (07/30 0600) Pulse Rate: 90 (07/30 0600)  Labs: Recent Labs    December 11, 2021 1617 11/04/2021 0639 11/05/2021 1815 11/25/21 0229 11/25/21 2145 11/26/21 0320  HGB 15.2 14.3  --  14.7  --  14.4  HCT 47.0 43.9  --  44.4  --  42.1  PLT 202 180  --  157  --  164  APTT 30  --   --   --  73*  --   LABPROT 15.0  --   --   --   --   --   INR 1.2  --   --   --   --   --   HEPARINUNFRC  --   --   --   --  0.40 0.31  CREATININE 2.10* 1.98* 2.51* 2.88*  --  3.54*     Estimated Creatinine Clearance: 21 mL/min (A) (by C-G formula based on SCr of 3.54 mg/dL (H)).   Medical History: Past Medical History:  Diagnosis Date   CKD (chronic kidney disease), stage II    COVID-19 virus infection 03/2019   HFrEF (heart failure with reduced ejection fraction) (HCC)    a. 2011 Echo: EF 25-30%; b. 10/2016 Echo: EF 20-25%; c. 11/2019 Echo: EF 20-25%, mild LVH, Gr2 DD, sev dil LA, mod dil RA, mod MR, mild to mod AI, mod PR, Asc Ao 64mm.   Hypertension    NICM (nonischemic cardiomyopathy) (HCC)    a. 2011 Echo: EF 25-30%; b. 2011 MV: basal to dist inf perfusion defect->atten; c. 10/2016 Echo: EF 20-25%; d. 11/2019 Echo: EF 20-25%; e. 11/2019 MV: No ischemia. Fixed inflat defect, inflat HK, EF 17%; f. 03/2020 Cath: Nl cors. CO/CI 4.07/1.92.   PSVT (paroxysmal supraventricular tachycardia) (HCC)    a. 12/2016 Event monitor: rare, short episodes of SVT.    Assessment: 69 YOM with M2 infarct s/p mechanical thrombectomy on 7/28.  CT showed small volume SAH and extravasated contrast. Repeat CT on 7/29 shows regressed SAH; no hemorrhagic transformation. Heparin infusion started for AF after  discussion with neurology (pt on apixaban PTA but was noncompliant).  Heparin level therapeutic at 0.31, CBC stable.  Goal of Therapy:  Heparin level 0.3-0.5 units/ml aPTT 66-85 seconds Monitor platelets by anticoagulation protocol: Yes   Plan:  Continue heparin 1000 units/h Daily heparin level and CBC  Fredonia Highland, PharmD, [place] Clinical Pharmacist [phone] Please check AMION for all [facility] Pharmacy numbers 11/26/2021

## 2021-11-26 NOTE — Progress Notes (Signed)
Patient ID: Steve Salazar, male   DOB: [birth year], 71 y.o.   MRN: [id number]     Advanced Heart Failure Rounding Note  PCP-Cardiologist: Ida Rogue, MD   Subjective:    Patient currently sleeping.  Per nursing, he is still aphasic but can follow some commands and moves all extremities.   SBP 90s-100s on milrinone 0.375 and NE 12.  Co-ox 57% this morning with creatinine up to 3.54.  UOP 615 cc yesterday.  CVP 14. He is having frequent PVCs and short NSVT runs.    Objective:   Weight Range: 87.1 kg Body mass index is 26.04 kg/m.   Vital Signs:   Temp:  [97.6 F (36.4 C)-97.9 F (36.6 C)] 97.9 F (36.6 C) (07/30 0300) Pulse Rate:  [87-99] 90 (07/30 0600) Resp:  [0-37] 36 (07/30 0600) BP: (74-151)/(58-133) 108/83 (07/30 0600) SpO2:  [92 %-100 %] 96 % (07/30 0600) Weight:  [87.1 kg] 87.1 kg (07/30 0400) Last BM Date :  (PTA)  Weight change: Filed Weights   10/30/2021 2326 11/26/21 0400  Weight: 86 kg 87.1 kg    Intake/Output:   Intake/Output Summary (Last 24 hours) at 11/26/2021 0811 Last data filed at 11/26/2021 0600 Gross per 24 hour  Intake 1604.6 ml  Output 615 ml  Net 989.6 ml      Physical Exam    General:  Well appearing. No resp difficulty HEENT: Normal Neck: Supple. JVP 14 cm. Carotids 2+ bilat; no bruits. No lymphadenopathy or thyromegaly appreciated. Cor: PMI lateral. Regular rate & rhythm. No rubs, gallops or murmurs. Lungs: Clear Abdomen: Soft, nontender, nondistended. No hepatosplenomegaly. No bruits or masses. Good bowel sounds. Extremities: No cyanosis, clubbing, rash, edema Neuro: Expressive aphasia, right-sided weakness.    Telemetry   Atrial fibrillation with BiV pacing, PVCs and short NSVT (personally reviewed)   Labs    CBC Recent Labs    10/28/2021 1617 11/25/2021 0639 11/25/21 0229 11/26/21 0320  WBC 7.3   < > 14.1* 11.9*  NEUTROABS 4.6  --   --   --   HGB 15.2   < > 14.7 14.4  HCT 47.0   < > 44.4 42.1  MCV 89.0   < > 89.7  87.2  PLT 202   < > 157 164   < > = values in this interval not displayed.   Basic Metabolic Panel Recent Labs    11/25/21 0229 11/26/21 0320  NA 135 133*  K 5.1 4.1  CL 101 97*  CO2 21* 23  GLUCOSE 119* 136*  BUN 36* 45*  CREATININE 2.88* 3.54*  CALCIUM 9.0 8.5*  MG  --  2.3  PHOS  --  5.5*   Liver Function Tests Recent Labs    11/25/21 0229 11/26/21 0320  AST 66* 60*  ALT 50* 59*  ALKPHOS 48 48  BILITOT 4.4* 3.1*  PROT 5.6* 5.3*  ALBUMIN 3.4* 3.2*   No results for input(s): "LIPASE", "AMYLASE" in the last 72 hours. Cardiac Enzymes No results for input(s): "CKTOTAL", "CKMB", "CKMBINDEX", "TROPONINI" in the last 72 hours.  BNP: BNP (last 3 results) No results for input(s): "BNP" in the last 8760 hours.  ProBNP (last 3 results) No results for input(s): "PROBNP" in the last 8760 hours.   D-Dimer No results for input(s): "DDIMER" in the last 72 hours. Hemoglobin A1C Recent Labs    11/19/2021 0640  HGBA1C 5.5   Fasting Lipid Panel Recent Labs    11/19/2021 0627  CHOL 150  HDL 62  LDLCALC 79  TRIG 44  CHOLHDL 2.4   Thyroid Function Tests No results for input(s): "TSH", "T4TOTAL", "T3FREE", "THYROIDAB" in the last 72 hours.  Invalid input(s): "FREET3"  Other results:   Imaging    No results found.   Medications:     Scheduled Medications:  Chlorhexidine Gluconate Cloth  6 each Topical Daily   furosemide  80 mg Intravenous Once   insulin aspart  0-15 Units Subcutaneous Q4H    Infusions:  sodium chloride     sodium chloride     sodium chloride     amiodarone     amiodarone     dexmedetomidine (PRECEDEX) IV infusion 0.5 mcg/kg/hr (11/26/21 0600)   heparin 1,000 Units/hr (11/26/21 0600)   milrinone 0.375 mcg/kg/min (11/26/21 0600)   norepinephrine (LEVOPHED) Adult infusion 12 mcg/min (11/26/21 0600)   vasopressin Stopped (11/25/21 1054)    PRN Medications: Place/Maintain arterial line **AND** sodium chloride, acetaminophen **OR**  acetaminophen (TYLENOL) oral liquid 160 mg/5 mL **OR** acetaminophen, mouth rinse, senna-docusate    Assessment/Plan   1. CVA: Acute left MCA CVA, likely cardioembolic.  Has dilated cardiomyopathy, also chronic atrial fibrillation. Echo did not show LV thrombus.  He is now s/p thrombectomy with residual small SAH. Per family, he probably was not taking his Eliquis at home.  He remains aphasic with right-sided weakness.  - He is now on heparin gtt.  Eventually, would resume Eliquis (not a drug failure as he apparently was not taking it).  2. Atrial fibrillation: Chronic, s/p AV nodal ablation.  Had successful DCCV in 6/23 but now back in AF.  - OK for heparin gtt now per neuro.  3. AKI on CKD stage 3: Baseline creatinine 1.8-2 range, suspect cardiorenal syndrome.  Up to 3.54 now, patient initially was hypotensive and off his home inotrope.  - Maintain MAP with NE, have restarted milrinone.  - Place arterial line for better BP monitoring.   4. Acute on chronic systolic CHF:  NICM, found as early as 2011.  Last cath in 12/21 with normal coronaries.  Echo this admission with EF 20%, severe LV dilation, severe biatrial enlargement, moderate RV enlargement with mildly decreased systolic function, mod-severe MR, moderate TR, dilated IVC.  Has [employer] Jude CRT-D device.  He has known low output/end stage HF, was started on milrinone gtt 0.2 at [facility] in 12/22.  He so far has refused LVAD.  He had been off milrinone initially after admission, co-ox was 40% with AKI.  Milrinone restarted 7/29 at 0.375 and NE added to maintain MAP (had been on vasopressin). Today, co-ox 57% with CVP 14.  UOP not vigorous with Lasix yesterday and creatinine up to 3.54 this morning.  - Place arterial line to follow BP as above.  - Lasix 80 mg IV x 1 again today.  - Continue milrinone 0.375, resend co-ox (marginal but would not change inotrope).  - Continue NE to maintain MAP as needed rather than vasopressin.  - There is consideration  in notes about barostimulator placement, however not sure this would be very helpful with such advanced HF (inotrope-dependent).  - Patient has refused LVAD in the past, now with large left MCA CVA.  He is not currently an LVAD candidate with the acute CVA (will need to see functional recovery), and given apparent lack of compliance with apixaban would be concerned about his compliance with warfarin if an LVAD were implanted. Concerned with rising creatinine that we are heading in the wrong direction.  Would continue aggressive medical  management, but given lack of viable long-term end point (do not think LVAD candidate and has refused in past, now with expressive aphasia and confusion), would avoid temporary mechanical support.  Palliative care is following.  5. Mitral regurgitation: Moderate-severe functional MR.  LVESD > 7 cm, would not be Mitraclip candidate.  6. PVCs/NSVT: Frequent.  Will start amiodarone gtt without bolus to suppress.   CRITICAL CARE Performed by: Marca Ancona  Total critical care time: 40 minutes  Critical care time was exclusive of separately billable procedures and treating other patients.  Critical care was necessary to treat or prevent imminent or life-threatening deterioration.  Critical care was time spent personally by me on the following activities: development of treatment plan with patient and/or surrogate as well as nursing, discussions with consultants, evaluation of patient's response to treatment, examination of patient, obtaining history from patient or surrogate, ordering and performing treatments and interventions, ordering and review of laboratory studies, ordering and review of radiographic studies, pulse oximetry and re-evaluation of patient's condition.   Length of Stay: 3  Marca Ancona, MD  11/26/2021, 8:11 AM  Advanced Heart Failure Team Pager [phone] (M-F; 7a - 5p)  Please contact [facility] for night-coverage after hours (5p -7a ) and  weekends on [email or web address]

## 2021-11-26 NOTE — Procedures (Signed)
Arterial Catheter Insertion Procedure Note  LENIS NETTLETON  [id number]  Oct 20, 1950  Date:11/26/21  Time:10:18 AM    Provider Performing: Lorin Glass    Procedure: Insertion of Arterial Line (72620) with US guidance ([id number])   Indication(s) Blood pressure monitoring and/or need for frequent ABGs  Consent Risks of the procedure as well as the alternatives and risks of each were explained to the patient and/or caregiver.  Consent for the procedure was obtained and is signed in the bedside chart  Anesthesia None   Time Out Verified patient identification, verified procedure, site/side was marked, verified correct patient position, special equipment/implants available, medications/allergies/relevant history reviewed, required imaging and test results available.   Sterile Technique Maximal sterile technique including full sterile barrier drape, hand hygiene, sterile gown, sterile gloves, mask, hair covering, sterile ultrasound probe cover (if used).   Procedure Description Area of catheter insertion was cleaned with chlorhexidine and draped in sterile fashion. With real-time ultrasound guidance an arterial catheter was placed into the right radial artery.  Appropriate arterial tracings confirmed on monitor.     Complications/Tolerance None; patient tolerated the procedure well.   EBL Minimal   Specimen(s) None

## 2021-11-26 NOTE — Progress Notes (Signed)
Palliative:  HPI: 71 y.o. 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 He was admitted on 11/15/2021 with acute left MCA stroke status post thrombectomy, post procedure small subarachnoid hemorrhage, acute on chronic CHF with cardiogenic shock on vasopressors, AKI on CKD, metabolic encephalopathy, shock liver, and others. Palliative was consulted for goals of care conversations.  I met today with Steve Salazar at [facility] bedside. Steve Salazar is alert and Steve Salazar is feeding his soup. Steve Salazar nods his head and responds appropriately. He does manage to say "mmm huh" on occasion but otherwise non-verbal. He smiles and interacts but difficult to know how much he is processing to the complexity of his condition. I spoke mostly with Steve Salazar who had spoken with Steve Salazar about potential for LVAD and shared that he did not want to proceed with LVAD do to the significant therapy and recovery. I reiterated to Steve Salazar that LVAD is extremely difficult procedure but usually when we get to the point of requiring milrinone and discussing LVAD there are not really any other options to keep the heart going. We reviewed the positive that he is more awake and interactive but also concern for significant heart and kidney failure that is worsening. Family share that continue to be hopeful and want to continue with watchful waiting at this time. I explained that I will continue to check in and follow for support.   Of note, Steve Salazar shares that Steve Salazar has 2 sons and 1 daughter [place]. They were all present yesterday but Steve Salazar was not interacting at that stage. I did attempt to call and speak  with son Steve Salazar but no answer and voicemail not set up. I will follow up again tomorrow.   All questions/concerns addressed. Emotional support provided.   Exam: Alert, unable to assess orientation but responses appropriate. Mostly non-verbal. No distress. Breathing regular, unlabored. Frequent ectopy. Abd flat. Urine dark, sanguinous.   Plan: - Family remain hopeful for improvement. Full code, full scope requested.  - Ongoing goals of care conversations.   [place], NP Palliative Medicine Team Pager [phone] (Please see [email or web address] for schedule) Team Phone [phone]    Greater than 50%  of this time was spent counseling and coordinating care related to the above assessment and plan

## 2021-11-26 NOTE — Progress Notes (Signed)
NAME:  Steve Salazar, MRN:  [id number], DOB:  11-28-1950, LOS: 3 ADMISSION DATE:  11/15/2021, CONSULTATION DATE: 11-29-21 REFERRING MD: Dr. 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  Patient remained hypotensive, required initiation of vasopressor, PCCM was consulted for help evaluation and medical management Currently patient has severe expressive aphasia, he is on Levophed 10 mics and vasopressin  Pertinent  Medical History   Past Medical History:  Diagnosis Date   CKD (chronic kidney disease), stage II    COVID-19 virus infection 03/2019   HFrEF (heart failure with reduced ejection fraction) (HCC)    a. 2011 Echo: EF 25-30%; b. 10/2016 Echo: EF 20-25%; c. 11/2019 Echo: EF 20-25%, mild LVH, Gr2 DD, sev dil LA, mod dil RA, mod MR, mild to mod AI, mod PR, Asc Ao 84mm.   Hypertension    NICM (nonischemic cardiomyopathy) (HCC)    a. 2011 Echo: EF 25-30%; b. 2011 MV: basal to dist inf perfusion defect->atten; c. 10/2016 Echo: EF 20-25%; d. 11/2019 Echo: EF 20-25%; e. 11/2019 MV: No ischemia. Fixed inflat defect, inflat HK, EF 17%; f. 03/2020 Cath: Nl cors. CO/CI 4.07/1.92.   PSVT (paroxysmal supraventricular tachycardia) (HCC)    a. 12/2016 Event monitor: rare, short episodes of SVT.    Significant Hospital Events: Including procedures, antibiotic start and stop dates in addition to other pertinent events   7/28 Presented with expressive aphasia, on work-up noted to have acute left MCA stroke with M2 occlusion, underwent mechanical thrombectomy, 7/29 Remains altered with hypotension and cool  extremities. Renal function and LFTs both elevated   Interim History / Subjective:  Remains confused. Urine output sluggish.  Objective   Blood pressure 108/83, pulse 90, temperature 97.9 F (36.6 C), temperature source Axillary, resp. rate (!) 36, height 6' (1.829 m), weight 87.1 kg, SpO2 96 %. CVP:  [11 mmHg-22 mmHg] 14 mmHg      Intake/Output Summary (Last 24 hours) at 11/26/2021 0835 Last data filed at 11/26/2021 0600 Gross per 24 hour  Intake 1604.6 ml  Output 615 ml  Net 989.6 ml    Filed Weights   11/21/2021 2326 11/26/21 0400  Weight: 86 kg 87.1 kg    Examination: Chronically ill apparing man lying bed Trachea midline +S3, heart sounds regular Marked edema Aphasic Moves all 4 ext but R less so.  LFTs stable Cr worsening  Resolved Hospital Problem list     Assessment & Plan:  Acute left MCA stroke s/p thrombectomy -Etiology cardioembolic in the setting of afib on eliquis and cardiomyopathy with low EF.  Residual deficits include aphasia and R sided weakness. Postprocedure small subarachnoid hemorrhage-MRI confirmed left MCA territory stroke with small amount of subarachnoid hemorrhage; improved on f/u imaging P: -Primary management per neurology/NSGY Maintain neuro protective measures; goal for eurothermia, euglycemia, eunatermia, normoxia, and PCO2 goal of 35-40 -Nutrition and bowel regimen -Seizure precautions  -Aspirations precautions   Acute on chronic systolic CHF with cardiogenic shock Chronic atrial fibrillation-Anticoagulated at baseline with Eliquis Cardiorenal syndrome- worsened by dye load Frequent ectopy P: - Continue levophed titrated to MAP 65 - Amio/milrinone/diuretic titration per advanced heart failure - I do not think he is a dialysis candidate,  if kidneys continue to deteriorate need to focus on comfort, palliative following  Best Practice (right click and "Reselect all SmartList Selections" daily)   Diet/type: Regular consistency  (see orders) DVT prophylaxis: LMWH GI prophylaxis: N/A Lines: yes and it is still needed as port Foley:  Yes, and it is still needed Code Status:  full code Last date of multidisciplinary goals of care discussion: 7/29 see IPAL note  Critical care time: 33 mins   Myrla Halsted MD PCCM

## 2021-11-26 NOTE — Evaluation (Signed)
Speech Language Pathology Evaluation Patient Details Name: DONYEL NESTER MRN: [id number] DOB: [birth year] Today's Date: 11/26/2021 Time: 5809-9833 SLP Time Calculation (min) (ACUTE ONLY): 15 min  Problem List:  Patient Active Problem List   Diagnosis Date Noted   Cardiogenic shock (HCC)    Stroke (cerebrum) (HCC) 2021/11/30   Atrial flutter (HCC)    GERD (gastroesophageal reflux disease) 04/15/2021   Syncope 04/15/2021   VT (ventricular tachycardia) (HCC) 04/15/2021   Biventricular ICD (implantable cardioverter-defibrillator) in place 08/23/2020   CHF (congestive heart failure) (HCC) 03/02/2020   Atrial tachycardia (HCC) 09/02/2017   CKD (chronic kidney disease) 07/10/2017   Elevated PSA 07/10/2017   Primary osteoarthritis of left shoulder 07/12/2016   BPH (benign prostatic hyperplasia) 01/13/2016   Family history of diabetes mellitus 09/29/2015   Right inguinal hernia 09/29/2015   Erectile dysfunction 06/01/2015   Chronic systolic CHF (congestive heart failure) (HCC) 08/12/2014   Essential hypertension 01/26/2013   Bradycardia 12/07/2011   Type 2 MI (myocardial infarction) (HCC) 07/22/2009   SVT/ PSVT/ PAT 07/22/2009   DYSPNEA 07/22/2009   Past Medical History:  Past Medical History:  Diagnosis Date   CKD (chronic kidney disease), stage II    COVID-19 virus infection 03/2019   HFrEF (heart failure with reduced ejection fraction) (HCC)    a. 2011 Echo: EF 25-30%; b. 10/2016 Echo: EF 20-25%; c. 11/2019 Echo: EF 20-25%, mild LVH, Gr2 DD, sev dil LA, mod dil RA, mod MR, mild to mod AI, mod PR, Asc Ao 41mm.   Hypertension    NICM (nonischemic cardiomyopathy) (HCC)    a. 2011 Echo: EF 25-30%; b. 2011 MV: basal to dist inf perfusion defect->atten; c. 10/2016 Echo: EF 20-25%; d. 11/2019 Echo: EF 20-25%; e. 11/2019 MV: No ischemia. Fixed inflat defect, inflat HK, EF 17%; f. 03/2020 Cath: Nl cors. CO/CI 4.07/1.92.   PSVT (paroxysmal supraventricular tachycardia) (HCC)    a. 12/2016  Event monitor: rare, short episodes of SVT.   Past Surgical History:  Past Surgical History:  Procedure Laterality Date   BIV ICD INSERTION CRT-D N/A 05/13/2020   Procedure: BIV ICD INSERTION CRT-D;  Surgeon: [name], MD;  Location: [facility] INVASIVE CV LAB;  Service: Cardiovascular;  Laterality: N/A;   CARDIOVERSION N/A 10/16/2021   Procedure: CARDIOVERSION;  Surgeon: Iran Ouch, MD;  Location: [facility] ORS;  Service: Cardiovascular;  Laterality: N/A;   IR CT HEAD LTD  11/13/2021   IR CT HEAD LTD  11/10/2021   IR PERCUTANEOUS ART THROMBECTOMY/INFUSION INTRACRANIAL INC DIAG ANGIO  11/18/2021   IR US GUIDE VASC ACCESS RIGHT  11/12/2021   RIGHT/LEFT HEART CATH AND CORONARY ANGIOGRAPHY Bilateral 04/01/2020   Procedure: RIGHT/LEFT HEART CATH AND CORONARY ANGIOGRAPHY;  Surgeon: [name], MD;  Location: [facility] INVASIVE CV LAB;  Service: Cardiovascular;  Laterality: Bilateral;   HPI:  71 y.o. male presented on 11-30-21 with expressive aphasia - on work-up noted to have acute left MCA stroke with M2 occlusion, underwent mechanical thrombectomy, with TICI 2C revascularization, complicated with small amount of subarachnoid hemorrhage, acute on chronic CHF with cardiogenic shock on vasopressors, AKI on CKD, metabolic encephalopathy, shock liver. 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   Assessment / Plan / Recommendation Clinical Impression  Pt presents with a nonfluent aphasia with limited spontaneity of verbal output. Today he was more alert and talking more, per brother and sister, Lissa Hoard and Eber Jones, at bedside.  Spontaneous output  was limited to single words, usually "okay" and "yeah." He was stimulable to produce the days-of-the-week and 1-10 with some paraphasic errors. He followed simple, predictable commands with 60% accuracy. He had difficulty discriminating among functional objects in the  room.  Repetition for single words was intact; impaired at phrase level.  Mr. Gassett will benefit from SLP f/u while in acute care to address aphasia. D/W his siblings. Will follow.    SLP Assessment  SLP Recommendation/Assessment: Patient needs continued Speech Lanaguage Pathology Services SLP Visit Diagnosis: Aphasia (R47.01)    Recommendations for follow up therapy are one component of a multi-disciplinary discharge planning process, led by the attending physician.  Recommendations may be updated based on patient status, additional functional criteria and insurance authorization.    Follow Up Recommendations  Acute inpatient rehab (3hours/day)    Assistance Recommended at Discharge  Frequent or constant Supervision/Assistance  Functional Status Assessment Patient has had a recent decline in their functional status and demonstrates the ability to make significant improvements in function in a reasonable and predictable amount of time.  Frequency and Duration min 2x/week  2 weeks      SLP Evaluation Cognition  Overall Cognitive Status:  (aphasia interferes) Arousal/Alertness: Awake/alert Attention: Focused Focused Attention: Appears intact       Comprehension  Auditory Comprehension Overall Auditory Comprehension: Impaired Yes/No Questions: Impaired Basic Immediate Environment Questions: 50-74% accurate Commands: Impaired One Step Basic Commands: 50-74% accurate Conversation: Simple Visual Recognition/Discrimination Discrimination: Exceptions to [facility] L/R Discrimination:  (60%) Common Objects: Able for objects in room (2 out of 3 - poor searching) Reading Comprehension Reading Status: Not tested    Expression Verbal Expression Overall Verbal Expression: Impaired Initiation: Impaired Automatic Speech: Counting;Day of week (with mod cues) Level of Generative/Spontaneous Verbalization: Word Repetition: Impaired Level of Impairment: Phrase level Naming:  Impairment Responsive: 0-25% accurate Confrontation: Impaired Common Objects: Able for objects in room (2 out of 3 - poor searching) Verbal Errors: Phonemic paraphasias Written Expression Dominant Hand: Right   Oral / Motor  Oral Motor/Sensory Function Overall Oral Motor/Sensory Function: Mild impairment Facial Symmetry: Abnormal symmetry right;Suspected CN VII (facial) dysfunction Lingual Symmetry: Within Functional Limits Motor Speech Overall Motor Speech: Appears within functional limits for tasks assessed (will need further assessment)            Blenda Mounts Laurice 11/26/2021, 3:51 PM Marchelle Folks L. Samson Frederic, MA CCC/SLP Clinical Specialist - Acute Care SLP Acute Rehabilitation Services Office number [phone]

## 2021-11-26 NOTE — Progress Notes (Signed)
STROKE TEAM PROGRESS NOTE   INTERVAL HISTORY Heart failure team on board-  on levo and milrinone. Art line placed today.  Off sedation. More awake/interactive today.  On IV heparing ggt. Exam improving. Vitals:   11/26/21 1000 11/26/21 1031 11/26/21 1100 11/26/21 1200  BP: 104/77 90/70    Pulse: 85 84 88 90  Resp: (!) 26 (!) 34 16 (!) 24  Temp:      TempSrc:      SpO2: 97% 95% 99% 100%  Weight:      Height:       CBC:  Recent Labs  Lab 11/25/2021 1617 12-21-2021 0639 11/25/21 0229 11/26/21 0320  WBC 7.3   < > 14.1* 11.9*  NEUTROABS 4.6  --   --   --   HGB 15.2   < > 14.7 14.4  HCT 47.0   < > 44.4 42.1  MCV 89.0   < > 89.7 87.2  PLT 202   < > 157 164   < > = values in this interval not displayed.    Basic Metabolic Panel:  Recent Labs  Lab 11/25/21 0229 11/26/21 0320  NA 135 133*  K 5.1 4.1  CL 101 97*  CO2 21* 23  GLUCOSE 119* 136*  BUN 36* 45*  CREATININE 2.88* 3.54*  CALCIUM 9.0 8.5*  MG  --  2.3  PHOS  --  5.5*    Lipid Panel:  Recent Labs  Lab 12-21-21 0627  CHOL 150  TRIG 44  HDL 62  CHOLHDL 2.4  VLDL 9  LDLCALC 79    HgbA1c:  Recent Labs  Lab Dec 21, 2021 0640  HGBA1C 5.5    Urine Drug Screen: No results for input(s): "LABOPIA", "COCAINSCRNUR", "LABBENZ", "AMPHETMU", "THCU", "LABBARB" in the last 168 hours.  Alcohol Level  Recent Labs  Lab 11/10/2021 1617  ETH <10     IMAGING past 24 hours No results found.  PHYSICAL EXAM  Physical Exam  Constitutional: Appears well-developed and well-nourished.   Cardiovascular: Normal rate and regular rhythm.  Respiratory: Effort normal, non-labored breathing  Neuro: Mental Status: Patient is sleepy but arousable to voice. He will open mouth, stick his tongue out. Open/close eyes. He was able to hold up both arms /legs off bed to command. He could not name anything, no verbal output.  Cranial Nerves: II: Pupils are equal, round, and reactive to light.  Inconsistent blink to threat III,IV,  VI: EOMI without ptosis or diploplia.  VII: Slight right facial droop VIII: Hearing is intact to voice XII: Tongue protrudes midline without atrophy or fasciculations.  Motor: Tone is normal. Bulk is normal.  Able to lift off extremities off bed, noted difficulty with right UE-however he as multiple IV lines placed.  Sensory: Intact to LT UE and  LE b/l. Cerebellar: No obvious ataxia with movements   ASSESSMENT/PLAN Steve Salazar is a 71 y.o. 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 On arrival to [facility] NIH increased from 3 to 6 and patient was taken to IR for a left M2 thrombectomy.  Cardiology consulted for milrinone and medication management. Currently requiring vasopressin and norepinephrine for BP management. Start heparin 7/29 given CT showing regression of SAH.   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  ASPECTS 10.    CTA head & neck Occluded proximal left M2 MCA vessel. Post IR CT contrast extraversion and SAH Repeat head CT- Small-volume acute subarachnoid hemorrhage/extravasated contrast along the left cerebral hemisphere has slightly increased  MRI moderate left MCA infarct 2D Echo EF 20%, global hypokinesis, aortic aneurysm 42mm LDL 79 HgbA1c 5.5 VTE prophylaxis - Lovenox Eliquis prior to admission, con't heparin IV per stroke protocol given significant risk of thrombosis Therapy recommendations:  CIR Disposition:  Pending  Post IR SAH Post IR CT contrast extraversion and SAH Repeat head CT- Small-volume acute subarachnoid hemorrhage/extravasated contrast along the left cerebral hemisphere has slightly increased  BP goal < 160  Hx of Hypertension Hypotension  Home meds: Losartan- hold Per  girlfriend, BP at home 90s BP goal 110-140  Requiring norepinephrine and vasopressin CCM on board  Atrial fibrillation  CHF Cardiomyopathy with low EF Home meds: Milrinone IV, Lasix, spironolactone, Eliquis Will need to resume anticoagulation given hx of A-fib and low EF Cardiology on board, appreciate help. Now on 2H  AKI on CKD 2/3 CCM managing.  Other Stroke Risk Factors Advanced Age >/= 64   Other Active Problems AICD placement Gout  Hospital day # 3   To contact Stroke Continuity provider, please refer to [email or web address]. After hours, contact General Neurology

## 2021-11-27 ENCOUNTER — Encounter (HOSPITAL_COMMUNITY): Payer: Self-pay | Admitting: Interventional Radiology

## 2021-11-27 DIAGNOSIS — I63412 Cerebral infarction due to embolism of left middle cerebral artery: Secondary | ICD-10-CM | POA: Diagnosis not present

## 2021-11-27 DIAGNOSIS — I482 Chronic atrial fibrillation, unspecified: Secondary | ICD-10-CM | POA: Diagnosis not present

## 2021-11-27 DIAGNOSIS — Z515 Encounter for palliative care: Secondary | ICD-10-CM | POA: Diagnosis not present

## 2021-11-27 DIAGNOSIS — R570 Cardiogenic shock: Secondary | ICD-10-CM | POA: Diagnosis not present

## 2021-11-27 DIAGNOSIS — I5023 Acute on chronic systolic (congestive) heart failure: Secondary | ICD-10-CM | POA: Diagnosis not present

## 2021-11-27 DIAGNOSIS — I639 Cerebral infarction, unspecified: Secondary | ICD-10-CM | POA: Diagnosis not present

## 2021-11-27 DIAGNOSIS — Z7189 Other specified counseling: Secondary | ICD-10-CM | POA: Diagnosis not present

## 2021-11-27 LAB — CBC
HCT: 40.6 % (ref 39.0–52.0)
Hemoglobin: 14 g/dL (ref 13.0–17.0)
MCH: 29.7 pg (ref 26.0–34.0)
MCHC: 34.5 g/dL (ref 30.0–36.0)
MCV: 86.2 fL (ref 80.0–100.0)
Platelets: 160 10*3/uL (ref 150–400)
RBC: 4.71 MIL/uL (ref 4.22–5.81)
RDW: 13.9 % (ref 11.5–15.5)
WBC: 10.6 10*3/uL — ABNORMAL HIGH (ref 4.0–10.5)
nRBC: 0.2 % (ref 0.0–0.2)

## 2021-11-27 LAB — GLUCOSE, CAPILLARY
Glucose-Capillary: 104 mg/dL — ABNORMAL HIGH (ref 70–99)
Glucose-Capillary: 111 mg/dL — ABNORMAL HIGH (ref 70–99)
Glucose-Capillary: 119 mg/dL — ABNORMAL HIGH (ref 70–99)
Glucose-Capillary: 214 mg/dL — ABNORMAL HIGH (ref 70–99)
Glucose-Capillary: 293 mg/dL — ABNORMAL HIGH (ref 70–99)
Glucose-Capillary: 95 mg/dL (ref 70–99)

## 2021-11-27 LAB — HEPATIC FUNCTION PANEL
ALT: 58 U/L — ABNORMAL HIGH (ref 0–44)
AST: 47 U/L — ABNORMAL HIGH (ref 15–41)
Albumin: 3 g/dL — ABNORMAL LOW (ref 3.5–5.0)
Alkaline Phosphatase: 56 U/L (ref 38–126)
Bilirubin, Direct: 0.4 mg/dL — ABNORMAL HIGH (ref 0.0–0.2)
Indirect Bilirubin: 1.4 mg/dL — ABNORMAL HIGH (ref 0.3–0.9)
Total Bilirubin: 1.8 mg/dL — ABNORMAL HIGH (ref 0.3–1.2)
Total Protein: 5.4 g/dL — ABNORMAL LOW (ref 6.5–8.1)

## 2021-11-27 LAB — BASIC METABOLIC PANEL
Anion gap: 10 (ref 5–15)
BUN: 47 mg/dL — ABNORMAL HIGH (ref 8–23)
CO2: 24 mmol/L (ref 22–32)
Calcium: 8.4 mg/dL — ABNORMAL LOW (ref 8.9–10.3)
Chloride: 96 mmol/L — ABNORMAL LOW (ref 98–111)
Creatinine, Ser: 3.28 mg/dL — ABNORMAL HIGH (ref 0.61–1.24)
GFR, Estimated: 19 mL/min — ABNORMAL LOW (ref >60)
Glucose, Bld: 112 mg/dL — ABNORMAL HIGH (ref 70–99)
Potassium: 3.7 mmol/L (ref 3.5–5.1)
Sodium: 130 mmol/L — ABNORMAL LOW (ref 135–145)

## 2021-11-27 LAB — COOXEMETRY PANEL
Carboxyhemoglobin: 1.4 % (ref 0.5–1.5)
Carboxyhemoglobin: 1.7 % — ABNORMAL HIGH (ref 0.5–1.5)
Methemoglobin: 0.8 % (ref 0.0–1.5)
Methemoglobin: 0.8 % (ref 0.0–1.5)
O2 Saturation: 52.8 %
O2 Saturation: 66.4 %
Total hemoglobin: 14.2 g/dL (ref 12.0–16.0)
Total hemoglobin: 14.1 g/dL (ref 12.0–16.0)

## 2021-11-27 LAB — HEPARIN LEVEL (UNFRACTIONATED)
Heparin Unfractionated: <0.1 IU/mL — ABNORMAL LOW (ref 0.30–0.70)
Heparin Unfractionated: <0.1 IU/mL — ABNORMAL LOW (ref 0.30–0.70)

## 2021-11-27 LAB — MAGNESIUM: Magnesium: 2.1 mg/dL (ref 1.7–2.4)

## 2021-11-27 LAB — PHOSPHORUS: Phosphorus: 5.4 mg/dL — ABNORMAL HIGH (ref 2.5–4.6)

## 2021-11-27 MED ORDER — FUROSEMIDE 10 MG/ML IJ SOLN
20.0000 mg/h | INTRAMUSCULAR | Status: DC
  Administered 2021-11-27 – 2021-11-28 (×4): 15 mg/h via INTRAVENOUS
  Administered 2021-11-29 – 2021-11-30 (×3): 20 mg/h via INTRAVENOUS
  Filled 2021-11-27 (×7): qty 20

## 2021-11-27 MED ORDER — FUROSEMIDE 10 MG/ML IJ SOLN
80.0000 mg | Freq: Once | INTRAMUSCULAR | Status: AC
Start: 2021-11-27 — End: 2021-11-27
  Administered 2021-11-27: 80 mg via INTRAVENOUS
  Filled 2021-11-27: qty 8

## 2021-11-27 MED ORDER — POTASSIUM CHLORIDE 20 MEQ PO PACK
20.0000 meq | PACK | Freq: Once | ORAL | Status: AC
Start: 2021-11-27 — End: 2021-11-27
  Administered 2021-11-27: 20 meq via ORAL
  Filled 2021-11-27: qty 1

## 2021-11-27 MED ORDER — NOREPINEPHRINE 16 MG/250ML-% IV SOLN
0.0000 ug/min | INTRAVENOUS | Status: DC
  Administered 2021-11-27 (×2): 20 ug/min via INTRAVENOUS
  Administered 2021-11-29: 2 ug/min via INTRAVENOUS
  Filled 2021-11-27 (×3): qty 250

## 2021-11-27 MED ORDER — MIDODRINE HCL 5 MG PO TABS
5.0000 mg | ORAL_TABLET | Freq: Three times a day (TID) | ORAL | Status: DC
  Administered 2021-11-27 – 2021-11-28 (×4): 5 mg via ORAL
  Filled 2021-11-27 (×4): qty 1

## 2021-11-27 MED ORDER — DOBUTAMINE IN D5W 4-5 MG/ML-% IV SOLN
7.5000 ug/kg/min | INTRAVENOUS | Status: DC
  Administered 2021-11-27: 5 ug/kg/min via INTRAVENOUS
  Administered 2021-11-28 – 2021-12-13 (×15): 7.5 ug/kg/min via INTRAVENOUS
  Filled 2021-11-27 (×17): qty 250

## 2021-11-27 NOTE — Progress Notes (Signed)
NAME:  Steve Salazar, MRN:  [id number], DOB:  [birth year], LOS: 4 ADMISSION DATE:  05-Dec-2021, CONSULTATION DATE: 11/01/2021 REFERRING MD: Dr. 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  Patient remained hypotensive, required initiation of vasopressor, PCCM was consulted for help evaluation and medical management  Pertinent  Medical History   Past Medical History:  Diagnosis Date   CKD (chronic kidney disease), stage II    COVID-19 virus infection 03/2019   HFrEF (heart failure with reduced ejection fraction) (HCC)    a. 2011 Echo: EF 25-30%; b. 10/2016 Echo: EF 20-25%; c. 11/2019 Echo: EF 20-25%, mild LVH, Gr2 DD, sev dil LA, mod dil RA, mod MR, mild to mod AI, mod PR, Asc Ao 73mm.   Hypertension    NICM (nonischemic cardiomyopathy) (HCC)    a. 2011 Echo: EF 25-30%; b. 2011 MV: basal to dist inf perfusion defect->atten; c. 10/2016 Echo: EF 20-25%; d. 11/2019 Echo: EF 20-25%; e. 11/2019 MV: No ischemia. Fixed inflat defect, inflat HK, EF 17%; f. 03/2020 Cath: Nl cors. CO/CI 4.07/1.92.   PSVT (paroxysmal supraventricular tachycardia) (HCC)    a. 12/2016 Event monitor: rare, short episodes of SVT.    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 Renal function and LFTs both elevated   Interim History / Subjective:   Vasopressin was turned off Patient remains on Levophed at 20 mics Milrinone was switched to dobutamine for advanced heart failure   Objective   Blood pressure 99/81, pulse 89, temperature 97.8 F (36.6 C), temperature source Oral, resp. rate 18, height 6' (1.829 m), weight 91.4 kg, SpO2 100 %. CVP:  [11 mmHg-46 mmHg] 22 mmHg      Intake/Output Summary (Last 24 hours) at 11/27/2021 1158 Last data filed at 11/27/2021 0600 Gross per 24 hour  Intake 2659.35 ml  Output 800 ml  Net 1859.35 ml   Filed Weights   12/05/21 2326 11/26/21 0400 11/27/21 0500  Weight: 86 kg 87.1 kg 91.4 kg    Examination: Physical exam: General: Acute on chronically ill-appearing male, lying on the bed HEENT: [place]/AT, eyes anicteric.  moist mucus membranes Neuro: Alert, awake following commands.  Aphasic, antigravity in all 4 extremities Chest: Coarse breath sounds, no wheezes or rhonchi Heart: Irregularly irregular, no murmurs or gallops Abdomen: Soft, nontender, nondistended, bowel sounds present Skin: No rash  LFTs and serum creatinine are stable  Resolved Hospital Problem list     Assessment & Plan:  Acute left MCA stroke s/p thrombectomy Etiology cardioembolic in the setting of afib on eliquis and cardiomyopathy with low EF.  Residual deficits include aphasia and R sided weakness Continue secondary stroke prophylaxis  Postprocedure small subarachnoid hemorrhage-MRI confirmed left MCA territory stroke with small amount of subarachnoid hemorrhage; improved on f/u imaging Continue neuro watch Neurology is following  Acute on chronic systolic CHF with cardiogenic shock Shock liver Appreciate heart failure team follow-up Milrinone will be switched to dobutamine [place]ox is at 52% Continue Levophed  with map goal 65  Chronic atrial fibrillation-Anticoagulated at baseline with Eliquis Heart rate remain well controlled Continue amiodarone Anticoagulated with heparin for now  AKI due to cardiorenal  syndrome- worsened by dye load Frequent ectopy Monitor serum creatinine Avoid nephrotoxic agents Monitor intake and output  Hypovolemic hyponatremia Closely monitor serum sodium  I agree with Dr. Katrinka Blazing that is not a dialysis candidate, if kidneys continue to deteriorate need to focus on comfort, palliative following  Best Practice (right click and "Reselect all SmartList Selections" daily)   Diet/type: Regular consistency (see orders) DVT prophylaxis: Systemic heparin GI prophylaxis: N/A Lines: yes and it is still needed as port Foley:  Yes, and it is still needed Code Status:  full code Last date of multidisciplinary goals of care discussion: 7/29 see IPAL note   Total critical care time: 36 minutes  Performed by: Cheri Fowler   Critical care time was exclusive of separately billable procedures and treating other patients.   Critical care was necessary to treat or prevent imminent or life-threatening deterioration.   Critical care was time spent personally by me on the following activities: development of treatment plan with patient and/or surrogate as well as nursing, discussions with consultants, evaluation of patient's response to treatment, examination of patient, obtaining history from patient or surrogate, ordering and performing treatments and interventions, ordering and review of laboratory studies, ordering and review of radiographic studies, pulse oximetry and re-evaluation of patient's condition.   Cheri Fowler, MD [facility] See Amion for pager If no response to pager, please call [phone] until 7pm After 7pm, Please call E-link [phone]

## 2021-11-27 NOTE — Progress Notes (Signed)
Palliative:  HPI: 71 y.o. 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 He was admitted on 11/17/2021 with acute left MCA stroke status post thrombectomy, post procedure small subarachnoid hemorrhage, acute on chronic CHF with cardiogenic shock on vasopressors, AKI on CKD, metabolic encephalopathy, shock liver, and others. Palliative was consulted for goals of care conversations.  I met today with Steve Salazar and multiple siblings at bedside. Steve Salazar is minimally verbal during my visit but nodding head and responding appropriately. I discussed with Steve Salazar that he is showing great improvement from his stroke. However, I am very concerned with his heart and kidney function. We reviewed that he has had significant heart failure for some time now and with his kidneys not working well this can be very bad. I explained that the doctors are adjusting medication (discussed Lasix infusion) to try and support his heart and kidneys to get the fluid that is building up from both of these organs dysfunctioning. I explained that if these medications do not help or if the kidneys worsen then we are running out of options. Steve Salazar nods in understanding and family at bedside provide him with reassurance. I reassured Steve Salazar that we will continue to take good care of him regardless of what comes next.   I called and spoke with son, Steve Salazar. I was able to reach Steve Salazar this time around. I had an honest conversation with Steve Salazar about the severity of his father's heart failure even prior to the stroke and hospitalization. I explained that now his heart is worsened to the stage of starting to shut down the kidneys.  I explained to Steve Salazar that we are doing everything we can to help the heart and the kidneys but if the measures we are providing now do not help we are running out of option. I worry that if Steve Salazar worsens that there is not more we can offer to improve his condition. Steve Salazar is very overwhelmed and expresses that he was not aware that his father's condition was that severe. Steve Salazar understands that from a stroke standpoint his father is improving but the real concern is for his heart and kidneys. I explained to Steve Salazar that I wanted him to be aware of the seriousness of his father's condition so that he can be prepared if he worsens. Steve Salazar was appreciative of the conversation and update although understandably needing time to process. Steve Salazar agrees with [place] sharing information with Steve Salazar and Steve Salazar as he relies on them to help with his father. He shares that often Steve Salazar takes lead.   All questions/concerns addressed. Emotional support provided. Updated Dr. Tacy Learn and RN.   Exam: Alert, seems oriented although limited verbal communication. Nodding head appropriately. Sitting up in recliner. Abd soft. Moves all extremities.   Plan: - Watchful waiting. Family remains hopeful for improvement.   [place], NP Palliative Medicine Team Pager [phone] (Please see [email or web address] for schedule) Team Phone [phone]    Greater than 50%  of this time was spent counseling and coordinating care related to the above assessment and plan

## 2021-11-27 NOTE — Progress Notes (Signed)
STROKE TEAM PROGRESS NOTE   INTERVAL HISTORY Patient is walking around the unit with physical therapy.  He got tired and had to sit down.  He continues to have expressive aphasia which is slightly worsened since his physical activity and hypertension.  He is on dobutamine.  His family is at the bedside.  On IV heparing ggt. Exam improving. Vitals:   11/27/21 0400 11/27/21 0500 11/27/21 0745 11/27/21 1144  BP: 99/81     Pulse: 89     Resp: 18     Temp:   97.9 F (36.6 C) 97.8 F (36.6 C)  TempSrc:   Oral Oral  SpO2: 100%     Weight:  91.4 kg    Height:       CBC:  Recent Labs  Lab 11/22/2021 1617 11/27/2021 0639 11/26/21 0320 11/27/21 0433  WBC 7.3   < > 11.9* 10.6*  NEUTROABS 4.6  --   --   --   HGB 15.2   < > 14.4 14.0  HCT 47.0   < > 42.1 40.6  MCV 89.0   < > 87.2 86.2  PLT 202   < > 164 160   < > = values in this interval not displayed.   Basic Metabolic Panel:  Recent Labs  Lab 11/26/21 0320 11/27/21 0433  NA 133* 130*  K 4.1 3.7  CL 97* 96*  CO2 23 24  GLUCOSE 136* 112*  BUN 45* 47*  CREATININE 3.54* 3.28*  CALCIUM 8.5* 8.4*  MG 2.3 2.1  PHOS 5.5* 5.4*   Lipid Panel:  Recent Labs  Lab 10/28/2021 0627  CHOL 150  TRIG 44  HDL 62  CHOLHDL 2.4  VLDL 9  LDLCALC 79   HgbA1c:  Recent Labs  Lab 11/13/2021 0640  HGBA1C 5.5   Urine Drug Screen: No results for input(s): "LABOPIA", "COCAINSCRNUR", "LABBENZ", "AMPHETMU", "THCU", "LABBARB" in the last 168 hours.  Alcohol Level  Recent Labs  Lab 11/02/2021 1617  ETH <10    IMAGING past 24 hours No results found.  PHYSICAL EXAM  Physical Exam  Constitutional: Appears well-developed and well-nourished elderly African-American male.   Cardiovascular: Normal rate and regular rhythm.  Respiratory: Effort normal, non-labored breathing  Neuro: Mental Status: Patient is awake and interactive.  He remains aphasic and is able to speak only a few words but not sentences.  Very limited verbal output.  Unable to  name or repeat.  He will open mouth, stick his tongue out. Open/close eyes. He was able to hold up both arms /legs off bed to command.  Cranial Nerves: II: Pupils are equal, round, and reactive to light.  Inconsistent blink to threat III,IV, VI: EOMI without ptosis or diploplia.  VII: Slight right facial droop VIII: Hearing is intact to voice XII: Tongue protrudes midline without atrophy or fasciculations.  Motor: Tone is normal. Bulk is normal.  Able to lift off extremities off bed, noted difficulty with right UE-however he as multiple IV lines placed.  Sensory: Intact to LT UE and  LE b/l. Cerebellar: No obvious ataxia with movements   ASSESSMENT/PLAN [name] is a 71 y.o. 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 On arrival to [facility] NIH increased from 3 to 6 and patient was taken to IR for a left M2 thrombectomy.  Cardiology consulted for milrinone and medication management. Currently requiring vasopressin and norepinephrine for  BP management. Start heparin 7/29 given CT showing regression of SAH.   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 ASPECTS 10.    CTA head & neck Occluded proximal left M2 MCA vessel. Post IR CT contrast extraversion and SAH Repeat head CT- Small-volume acute subarachnoid hemorrhage/extravasated contrast along the left cerebral hemisphere has slightly increased  MRI moderate left MCA infarct 2D Echo EF 20%, global hypokinesis, aortic aneurysm 67mm LDL 79 HgbA1c 5.5 VTE prophylaxis - Lovenox Eliquis prior to admission, con't heparin IV per stroke protocol given significant risk of thrombosis Therapy recommendations:  CIR Disposition:  Pending  Post IR SAH Post IR CT contrast  extraversion and SAH Repeat head CT- Small-volume acute subarachnoid hemorrhage/extravasated contrast along the left cerebral hemisphere has slightly increased  BP goal < 160  Hx of Hypertension Hypotension  Home meds: Losartan- hold Per girlfriend, BP at home 90s BP goal 110-140  Requiring norepinephrine and vasopressin CCM on board  Atrial fibrillation  CHF Cardiomyopathy with low EF Home meds: Milrinone IV, Lasix, spironolactone, Eliquis Will need to resume anticoagulation given hx of A-fib and low EF Cardiology on board, appreciate help. Now on 2H  AKI on CKD 2/3 CCM managing.  Other Stroke Risk Factors Advanced Age >/= 19   Other Active Problems AICD placement Gout  Hospital day # 4  Patient remains aphasic and hemodynamically unstable requiring vasopressor support.  Continue IV heparin for his history of A-fib and low EF.  Will need transition to oral anticoagulation when patient is able to swallow safely.  Continue ongoing therapy and management of heart failure as per cardiology.  Discussed with Dr. Merrily Pew critical care medicine and patient's family at the bedside and answered questions.This patient is critically ill and at significant risk of neurological worsening, death and care requires constant monitoring of vital signs, hemodynamics,respiratory and cardiac monitoring, extensive review of multiple databases, frequent neurological assessment, discussion with family, other specialists and medical decision making of high complexity.I have made any additions or clarifications directly to the above note.This critical care time does not reflect procedure time, or teaching time or supervisory time of PA/NP/Med Resident etc but could involve care discussion time.  I spent 30 minutes of neurocritical care time  in the care of  this patient.  Delia Heady, MD   To contact Stroke Continuity provider, please refer to [email or web address]. After hours, contact General Neurology

## 2021-11-27 NOTE — Progress Notes (Signed)
ANTICOAGULATION CONSULT NOTE  Pharmacy Consult:  Heparin Indication: Aflutter  No Known Allergies  Patient Measurements: Height: 6' (182.9 cm) Weight: 87.1 kg (192 lb 0.3 oz) IBW/kg (Calculated) : 77.6 Heparin Dosing Weight: 86 kg  Vital Signs: Temp: 97.9 F (36.6 C) (07/31 0345) Temp Source: Oral (07/31 0345) BP: 99/81 (07/31 0400) Pulse Rate: 89 (07/31 0400)  Labs: Recent Labs    11/25/21 0229 11/25/21 2145 11/26/21 0320 11/27/21 0433  HGB 14.7  --  14.4 14.0  HCT 44.4  --  42.1 40.6  PLT 157  --  164 160  APTT  --  Steve*  --   --   HEPARINUNFRC  --  0.40 0.31 <0.10*  CREATININE 2.88*  --  3.54* 3.28*     Estimated Creatinine Clearance: 22.7 mL/min (A) (by C-G formula based on SCr of 3.28 mg/dL (H)).   Medical History: Past Medical History:  Diagnosis Date   CKD (chronic kidney disease), stage II    COVID-19 virus infection 03/2019   HFrEF (heart failure with reduced ejection fraction) (HCC)    a. 2011 Echo: EF 25-30%; b. 10/2016 Echo: EF 20-25%; c. 11/2019 Echo: EF 20-25%, mild LVH, Gr2 DD, sev dil LA, mod dil RA, mod MR, mild to mod AI, mod PR, Asc Ao 23mm.   Hypertension    NICM (nonischemic cardiomyopathy) (HCC)    a. 2011 Echo: EF 25-30%; b. 2011 MV: basal to dist inf perfusion defect->atten; c. 10/2016 Echo: EF 20-25%; d. 11/2019 Echo: EF 20-25%; e. 11/2019 MV: No ischemia. Fixed inflat defect, inflat HK, EF 17%; f. 03/2020 Cath: Nl cors. CO/CI 4.07/1.92.   PSVT (paroxysmal supraventricular tachycardia) (HCC)    a. 12/2016 Event monitor: rare, short episodes of SVT.    Assessment: Steve Salazar with M2 infarct s/p mechanical thrombectomy on 7/28.  CT showed small volume SAH and extravasated contrast. Repeat CT on 7/29 shows regressed SAH; no hemorrhagic transformation. Heparin infusion started for AF after discussion with neurology (pt on apixaban PTA but was noncompliant).  Heparin level therapeutic at 0.31, CBC stable.  7/31 AM update:  Heparin level low No  infusion issues per RN  Goal of Therapy:  Heparin level 0.3-0.5 units/ml aPTT 66-85 seconds Monitor platelets by anticoagulation protocol: Yes   Plan:  Inc heparin to 1200 units/hr 1330 heparin level  Abran Duke, PharmD, BCPS Clinical Pharmacist Phone: [phone]

## 2021-11-27 NOTE — TOC Initial Note (Addendum)
Transition of Care [facility]) - Initial/Assessment Note    Patient Details  Name: Steve Salazar MRN: [id number] Date of Birth: 1950/10/29  Transition of Care [facility]) CM/SW Contact:    Elliot Cousin, RN Phone Number: [phone] 11/27/2021, 3:38 PM  Clinical Narrative:                 HF TOC CM spoke to pt and he gave permission to speak to his sister, and brother. Spoke to sister, Eber Jones and states they are wanting SNF rehab in [place] and [place] area. Brother, Cora Daniels lives in [place] and wants pt closer to him. Pt was independent prior to hospital stay and driving. Pt has Home IV Milrinone. Contacted [facility] Coordinator, Pam to follow up.   Sister gave permission to create [facility] and fax referral to SNF rehab.   Received call from [facility] and Pharmacy and pt is active with them for IV Milrinone and HH RN. Spoke rep, Raynaldo Opitz and provided contact # [phone] fax [phone], pharmacy fax # [phone].    Expected Discharge Plan: Skilled Nursing Facility Barriers to Discharge: SNF Pending bed offer   Patient Goals and CMS Choice Patient states their goals for this hospitalization and ongoing recovery are:: wants pt to get back to baseline CMS Medicare.gov Compare Post Acute Care list provided to:: Patient Represenative (must comment) (sister-Carolyn Ladona Ridgel) Choice offered to / list presented to : Patient  Expected Discharge Plan and Services Expected Discharge Plan: Skilled Nursing Facility     Post Acute Care Choice: Skilled Nursing Facility Living arrangements for the past 2 months: Single Family Home                                      Prior Living Arrangements/Services Living arrangements for the past 2 months: Single Family Home Lives with:: Other (Comment) Patient language and need for interpreter reviewed:: Yes Do you feel safe going back to the place where you live?: Yes      Need for Family Participation in Patient  Care: Yes (Comment) Care giver support system in place?: Yes (comment)   Criminal Activity/Legal Involvement Pertinent to Current Situation/Hospitalization: No - Comment as needed  Activities of Daily Living      Permission Sought/Granted Permission sought to share information with : Case Manager, Family Supports, PCP Permission granted to share information with : Yes, Verbal Permission Granted  Share Information with NAME: Jeanella Cara     Permission granted to share info w Relationship: sister  Permission granted to share info w Contact Information: [phone]  Emotional Assessment Appearance:: Appears stated age Attitude/Demeanor/Rapport: Lethargic Affect (typically observed): Unable to Assess Orientation: : Oriented to Self, Fluctuating Orientation (Suspected and/or reported Sundowners) Alcohol / Substance Use: Not Applicable Psych Involvement: No (comment)  Admission diagnosis:  Stroke (cerebrum) [facility]) [I63.9] Patient Active Problem List   Diagnosis Date Noted   Cardiogenic shock (HCC)    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   Atrial flutter (HCC)    GERD (gastroesophageal reflux disease) 04/15/2021   Syncope 04/15/2021   VT (ventricular tachycardia) (HCC) 04/15/2021   Biventricular ICD (implantable cardioverter-defibrillator) in place 08/23/2020   CHF (congestive heart failure) (HCC) 03/02/2020   Atrial tachycardia (HCC) 09/02/2017   CKD (chronic kidney disease) 07/10/2017   Elevated PSA 07/10/2017   Primary osteoarthritis of left shoulder 07/12/2016   BPH (benign prostatic hyperplasia) 01/13/2016   Family history of diabetes  mellitus 09/29/2015   Right inguinal hernia 09/29/2015   Erectile dysfunction 06/01/2015   Chronic systolic CHF (congestive heart failure) (HCC) 08/12/2014   Essential hypertension 01/26/2013   Bradycardia 12/07/2011   Type 2 MI (myocardial infarction) (HCC) 07/22/2009   SVT/ PSVT/ PAT 07/22/2009   DYSPNEA 07/22/2009   PCP:  [name], MD Pharmacy:   [facility] [address] [place], [place] - [address] [place] [address] Phone: [phone] Fax: [phone]  [id number] [place], [place] - [address] AT [facility] [address] [place] [address] Phone: [phone] Fax: [phone]     Social Determinants of Health (SDOH) Interventions    Readmission Risk Interventions     No data to display

## 2021-11-27 NOTE — Progress Notes (Signed)
ANTICOAGULATION CONSULT NOTE  Pharmacy Consult:  Heparin Indication: Aflutter  No Known Allergies  Patient Measurements: Height: 6' (182.9 cm) Weight: 91.4 kg (201 lb 8 oz) IBW/kg (Calculated) : 77.6 Heparin Dosing Weight: 86 kg  Vital Signs: Temp: 97.8 F (36.6 C) (07/31 1144) Temp Source: Oral (07/31 1144) BP: 99/81 (07/31 0400) Pulse Rate: 89 (07/31 0400)  Labs: Recent Labs    11/25/21 0229 11/25/21 2145 11/25/21 2145 11/26/21 0320 11/27/21 0433 11/27/21 1210  HGB 14.7  --   --  14.4 14.0  --   HCT 44.4  --   --  42.1 40.6  --   PLT 157  --   --  164 160  --   APTT  --  73*  --   --   --   --   HEPARINUNFRC  --  0.40   < > 0.31 <0.10* <0.10*  CREATININE 2.88*  --   --  3.54* 3.28*  --    < > = values in this interval not displayed.     Estimated Creatinine Clearance: 22.7 mL/min (A) (by C-G formula based on SCr of 3.28 mg/dL (H)).   Medical History: Past Medical History:  Diagnosis Date   CKD (chronic kidney disease), stage II    COVID-19 virus infection 03/2019   HFrEF (heart failure with reduced ejection fraction) (HCC)    a. 2011 Echo: EF 25-30%; b. 10/2016 Echo: EF 20-25%; c. 11/2019 Echo: EF 20-25%, mild LVH, Gr2 DD, sev dil LA, mod dil RA, mod MR, mild to mod AI, mod PR, Asc Ao 60mm.   Hypertension    NICM (nonischemic cardiomyopathy) (HCC)    a. 2011 Echo: EF 25-30%; b. 2011 MV: basal to dist inf perfusion defect->atten; c. 10/2016 Echo: EF 20-25%; d. 11/2019 Echo: EF 20-25%; e. 11/2019 MV: No ischemia. Fixed inflat defect, inflat HK, EF 17%; f. 03/2020 Cath: Nl cors. CO/CI 4.07/1.92.   PSVT (paroxysmal supraventricular tachycardia) (HCC)    a. 12/2016 Event monitor: rare, short episodes of SVT.    Assessment: 29 YOM with M2 infarct s/p mechanical thrombectomy on 7/28.  CT showed small volume SAH and extravasated contrast. Repeat CT on 7/29 shows regressed SAH; no hemorrhagic transformation. Heparin infusion started for AF after discussion with neurology  (pt on apixaban PTA but was noncompliant).  Heparin level remains undetectable after repeat, no infusions issues with RN. Lab drawn from central access, heparin infusing peripherally.  Goal of Therapy:  Heparin level 0.3-0.5 units/ml aPTT 66-85 seconds Monitor platelets by anticoagulation protocol: Yes   Plan:  Increase heparin to 1400 units/h Recheck heparin level in 8h Daily heparin level and CBC   Fredonia Highland, PharmD, [place], [facility] Clinical Pharmacist [phone] Please check AMION for all [facility] Pharmacy numbers 11/27/2021

## 2021-11-27 NOTE — Progress Notes (Addendum)
Patient ID: Steve Salazar, male   DOB: Sep 08, 1950, 71 y.o.   MRN: [id number]     Advanced Heart Failure Rounding Note  PCP-Cardiologist: [name], MD   Subjective:    C/w expressive aphasia but following commands.   On Milrinone 0.375 + NE 20. Off VP. Co-ox marginal at 53% (repeat pending). Frequent PVCs/ runs of NSVT on amio 30/hr.   K 3.7 Mg 2.1   WBC 14>>11>>10K. AF   Scr down slightly today, 2.88>>3.54>>3.28. MAPs mid 60s   1.3L in UOP yesterday but overall net + 1.4L. Wt up 9 lb. CVP 15-16.   Na 130    Objective:   Weight Range: 91.4 kg Body mass index is 27.33 kg/m.   Vital Signs:   Temp:  [97.9 F (36.6 C)-98.6 F (37 C)] 97.9 F (36.6 C) (07/31 0345) Pulse Rate:  [67-93] 89 (07/31 0400) Resp:  [0-35] 18 (07/31 0400) BP: (83-104)/(55-81) 99/81 (07/31 0400) SpO2:  [95 %-100 %] 100 % (07/31 0400) Arterial Line BP: (67-118)/(51-84) 80/76 (07/31 0400) Weight:  [91.4 kg] 91.4 kg (07/31 0500) Last BM Date :  (PTA)  Weight change: Filed Weights   11/02/2021 2326 11/26/21 0400 11/27/21 0500  Weight: 86 kg 87.1 kg 91.4 kg    Intake/Output:   Intake/Output Summary (Last 24 hours) at 11/27/2021 0731 Last data filed at 11/27/2021 0600 Gross per 24 hour  Intake 2659.35 ml  Output 1300 ml  Net 1359.35 ml      Physical Exam    CVP 16 General:  Well appearing. No respiratory difficulty HEENT: normal Neck: supple. JVD 16 cm. Carotids 2+ bilat; no bruits. No lymphadenopathy or thyromegaly appreciated. Cor: PMI nondisplaced. Irregular rhythm and tachy (frequent PVCs). No rubs, gallops or murmurs. Lungs: clear Abdomen: soft, nontender, nondistended. No hepatosplenomegaly. No bruits or masses. Good bowel sounds. Extremities: no cyanosis, clubbing, rash, edema Neuro: alert & oriented x 3 Affect pleasant. + expressive aphasia   Telemetry   Atrial fibrillation with BiV pacing, frequent PVCs and short runs of NSVT (personally reviewed)   Labs     CBC Recent Labs    11/26/21 0320 11/27/21 0433  WBC 11.9* 10.6*  HGB 14.4 14.0  HCT 42.1 40.6  MCV 87.2 86.2  PLT 164 160   Basic Metabolic Panel Recent Labs    67/20/94 0320 11/27/21 0433  NA 133* 130*  K 4.1 3.7  CL 97* 96*  CO2 23 24  GLUCOSE 136* 112*  BUN 45* 47*  CREATININE 3.54* 3.28*  CALCIUM 8.5* 8.4*  MG 2.3 2.1  PHOS 5.5* 5.4*   Liver Function Tests Recent Labs    11/26/21 0320 11/27/21 0433  AST 60* 47*  ALT 59* 58*  ALKPHOS 48 56  BILITOT 3.1* 1.8*  PROT 5.3* 5.4*  ALBUMIN 3.2* 3.0*   No results for input(s): "LIPASE", "AMYLASE" in the last 72 hours. Cardiac Enzymes No results for input(s): "CKTOTAL", "CKMB", "CKMBINDEX", "TROPONINI" in the last 72 hours.  BNP: BNP (last 3 results) No results for input(s): "BNP" in the last 8760 hours.  ProBNP (last 3 results) No results for input(s): "PROBNP" in the last 8760 hours.   D-Dimer No results for input(s): "DDIMER" in the last 72 hours. Hemoglobin A1C No results for input(s): "HGBA1C" in the last 72 hours.  Fasting Lipid Panel No results for input(s): "CHOL", "HDL", "LDLCALC", "TRIG", "CHOLHDL", "LDLDIRECT" in the last 72 hours.  Thyroid Function Tests No results for input(s): "TSH", "T4TOTAL", "T3FREE", "THYROIDAB" in the last 72 hours.  Invalid input(s): "FREET3"  Other results:   Imaging    No results found.   Medications:     Scheduled Medications:  Chlorhexidine Gluconate Cloth  6 each Topical Daily   insulin aspart  0-15 Units Subcutaneous Q4H    Infusions:  sodium chloride     sodium chloride     sodium chloride     sodium chloride     amiodarone 30 mg/hr (11/27/21 0600)   dexmedetomidine (PRECEDEX) IV infusion Stopped (11/26/21 0605)   heparin 1,200 Units/hr (11/27/21 0600)   milrinone 0.375 mcg/kg/min (11/27/21 0600)   norepinephrine (LEVOPHED) Adult infusion 20 mcg/min (11/27/21 [id number])   vasopressin Stopped (11/25/21 1054)    PRN  Medications: Place/Maintain arterial line **AND** sodium chloride, Place/Maintain arterial line **AND** sodium chloride, acetaminophen **OR** acetaminophen (TYLENOL) oral liquid 160 mg/5 mL **OR** acetaminophen, mouth rinse, senna-docusate    Assessment/Plan   1. CVA: Acute left MCA CVA, likely cardioembolic.  Has dilated cardiomyopathy, also chronic atrial fibrillation. Echo did not show LV thrombus.  He is now s/p thrombectomy with residual small SAH. Per family, he probably was not taking his Eliquis at home.  He remains aphasic with right-sided weakness.  - He is now on heparin gtt.  Eventually, would resume Eliquis (not a drug failure as he apparently was not taking it).  2. Atrial fibrillation: Chronic, s/p AV nodal ablation.  Had successful DCCV in 6/23 but now back in AF.  - OK for heparin gtt now per neuro.  3. AKI on CKD stage 3: Baseline creatinine 1.8-2 range, suspect cardiorenal syndrome. SCr peaked up to 3.54 (patient initially was hypotensive and off his home inotrope). Scr starting to improve, 3.28 today  - Maintain MAP with NE, have restarted milrinone, though Co-ox marginal at 53%. MAPs mid 60s  - Continue NE for BP support,  add midodrine 5 tid to help facilitate wean - Stop Milrinone. Switch to DBA 5 mcg/kg/min - 80 IV Lasix bolus x 1 followed by lasix gtt at 15/hr  4. Acute on chronic systolic CHF:  NICM, found as early as 2011.  Last cath in 12/21 with normal coronaries.  Echo this admission with EF 20%, severe LV dilation, severe biatrial enlargement, moderate RV enlargement with mildly decreased systolic function, mod-severe MR, moderate TR, dilated IVC.  Has [employer] CRT-D device.  He has known low output/end stage HF, was started on milrinone gtt 0.2 at [facility] in 12/22.  He so far has refused LVAD.  He had been off milrinone initially after admission, co-ox was 40% with AKI.  Milrinone restarted 7/29 at 0.375 and NE added to maintain MAP (had been on vasopressin). Today, co-ox  53% with CVP 16.  UOP not vigorous with Lasix yesterday, creatinine 3.54>>3.28 this morning.  - Stop milrinone. Start DBA 5 mcg/kg/min - Continue NE to maintain MAP as needed rather than vasopressin. Add midodrine 5 tid per above  - 80 IV Lasix bolus x 1 followed by lasix gtt at 12/hr  - There is consideration in notes about barostimulator placement, however not sure this would be very helpful with such advanced HF (inotrope-dependent).  - Patient has refused LVAD in the past, now with large left MCA CVA.  He is not currently an LVAD candidate with the acute CVA (will need to see functional recovery), and given apparent lack of compliance with apixaban would be concerned about his compliance with warfarin if an LVAD were implanted. Concerned with rising creatinine that we are heading in the wrong direction.  Would continue aggressive medical management, but given lack of viable long-term end point (do not think LVAD candidate and has refused in past, now with expressive aphasia and confusion), would avoid temporary mechanical support.  Palliative care is following.  5. Mitral regurgitation: Moderate-severe functional MR.  LVESD > 7 cm, would not be Mitraclip candidate.  6. PVCs/NSVT: Frequent.  Increase amiodarone gtt to 60/hr without bolus to suppress.  - keep K > 4.0 and Mg > 2.0    Length of Stay: [address], PA-C  11/27/2021, 7:31 AM  Advanced Heart Failure Team Pager [phone] (M-F; 7a - 5p)  Please contact [facility] for night-coverage after hours (5p -7a ) and weekends on [email or web address]   Patient seen with PA, agree with the above note.   He is doing better from a mental status standpoint, not confused.  Still with expressive aphasia but improved.  Moves all 4 limbs normally.   He remains on milrinone 0.375 and NE 20 with SBP 90s generally.  CVP 16-17 for me this morning with co-ox 53%.  I/Os positive.    PVCs, NSVT noted.  He is on amiodarone gtt 30.   General: NAD Neck:  JVP 16 cm, no thyromegaly or thyroid nodule.  Lungs: Clear to auscultation bilaterally with normal respiratory effort. CV: Lateral PMI.  Heart regular S1/S2, no S3/S4, no murmur.  No peripheral edema.   Abdomen: Soft, nontender, no hepatosplenomegaly, no distention.  Skin: Intact without lesions or rashes.  Neurologic: Alert and oriented x 3. Expressive aphasia.  Psych: Normal affect. Extremities: No clubbing or cyanosis.  HEENT: Normal.   Improving from stroke standpoint, now on heparin gtt.  He had not been taking apixaban at home.   With soft BP still, will transition to dobutamine 5 to see if this will help [place] with MAP.  Wean down NE as able.  Add midodrine 5 mg tid.   Creatinine elevated form baseline but lower at 3.28.  Poor diuresis yesterday and CVP 16-17 with weight up.  Will transition to Lasix gtt 15 mg/hr.  He would be a poor HD candidate.   Long-term, poor prognosis.  Not LVAD candidate at this point (and has not wanted).   CRITICAL CARE Performed by: Marca Ancona  Total critical care time: 40 minutes  Critical care time was exclusive of separately billable procedures and treating other patients.  Critical care was necessary to treat or prevent imminent or life-threatening deterioration.  Critical care was time spent personally by me on the following activities: development of treatment plan with patient and/or surrogate as well as nursing, discussions with consultants, evaluation of patient's response to treatment, examination of patient, obtaining history from patient or surrogate, ordering and performing treatments and interventions, ordering and review of laboratory studies, ordering and review of radiographic studies, pulse oximetry and re-evaluation of patient's condition.  Marca Ancona. 11/27/2021 8:38 AM

## 2021-11-27 NOTE — Progress Notes (Signed)
Physical Therapy Treatment Patient Details Name: Steve Salazar MRN: [id number] DOB: [birth year] Today's Date: 11/27/2021   History of Present Illness 71 y.o. male admitted to [facility] 02-Dec-2021 with acute aphasia, transfered to [facility] for thrombectomy of acute left M2 occlusion complicated with small amount of subarachnoid hemorrhage. PMHx:Gout, CKD2, NICM, HF with EF 15-20%, HTN, PSVT, ICD, Afib    PT Comments    Pt able to state name and siblings in room on arrival as well as answer other questions. Pt with improved gait distance with maintained right inattention with increasing right lean and assist with fatigue. Pt with BP drop during gait to 77/54 with noted increase in balance end expressive aphasia. Chair for return to room with BP 89/68 after rest. Pt able to perform bil LE HEP but required repeated step by step cuing with mod verbal and tactile cue with visual demonstration. Will continue to follow with AIR appropriate.  HR 89    Recommendations for follow up therapy are one component of a multi-disciplinary discharge planning process, led by the attending physician.  Recommendations may be updated based on patient status, additional functional criteria and insurance authorization.  Follow Up Recommendations  Acute inpatient rehab (3hours/day)     Assistance Recommended at Discharge Frequent or constant Supervision/Assistance  Patient can return home with the following A lot of help with walking and/or transfers;A lot of help with bathing/dressing/bathroom;Assistance with cooking/housework;Direct supervision/assist for medications management;Assist for transportation;Direct supervision/assist for financial management;Help with stairs or ramp for entrance   Equipment Recommendations  Rolling walker (2 wheels);BSC/3in1    Recommendations for Other Services Rehab consult     Precautions / Restrictions Precautions Precautions: Fall;Other (comment) Precaution Comments: aphasia, R  inattention; SBP < 180, 2 iv poles     Mobility  Bed Mobility Overal bed mobility: Needs Assistance Bed Mobility: Supine to Sit     Supine to sit: Min assist, HOB elevated     General bed mobility comments: HOB 15 degrees with assist to initiate and move legs off of bed, elevate trunk and scoot to EOB with max multimodal cues and increased time    Transfers Overall transfer level: Needs assistance   Transfers: Sit to/from Stand Sit to Stand: Min assist           General transfer comment: min assist for lines and safety without significant physical assist, increased cues to sit with fatigue    Ambulation/Gait Ambulation/Gait assistance: Min assist, +2 physical assistance, Mod assist Gait Distance (Feet): 300 Feet Assistive device: Rolling walker (2 wheels) Gait Pattern/deviations: Step-through pattern, Decreased stride length   Gait velocity interpretation: <1.8 ft/sec, indicate of risk for recurrent falls   General Gait Details: pt with decreased attention particularly to right, pt not following multimodal cues for turning right and required physical assist to avoid obstacles/wall on right. With fatigue, pt with decreased command following, increased right lean and increased assist from min to mod for standing balance with pt ultimately needing chair pulled to him and return to room seated. +2 assist for safety 2 iv poles and chair   Stairs             Wheelchair Mobility    Modified Rankin (Stroke Patients Only) Modified Rankin (Stroke Patients Only) Pre-Morbid Rankin Score: No symptoms Modified Rankin: Moderately severe disability     Balance Overall balance assessment: Needs assistance Sitting-balance support: No upper extremity supported, Feet supported Sitting balance-Leahy Scale: Fair Sitting balance - Comments: supervision static sitting   Standing balance support:  Bilateral upper extremity supported Standing balance-Leahy Scale: Poor Standing  balance comment: pt able to stand with RW present with guarding for safety                            Cognition Arousal/Alertness: Awake/alert Behavior During Therapy: Flat affect Overall Cognitive Status: Impaired/Different from baseline Area of Impairment: Attention, Following commands, Safety/judgement, Awareness, Problem solving, Orientation, Memory                 Orientation Level: Disoriented to, Time Current Attention Level: Sustained Memory: Decreased short-term memory Following Commands: Follows one step commands inconsistently, Follows one step commands with increased time Safety/Judgement: Decreased awareness of safety, Decreased awareness of deficits Awareness: Intellectual Problem Solving: Slow processing General Comments: pt with receptive and expressive aphasia. Pt able to state brother and sister in room, that he is in the hospital and provide name on arrival. Orientation he was not aware of day but could state month. End of session with fatigue and drop in BP pt unable to again name relatives in room or recall day of week. Pt with increased response to visual and tactile cues rather than verbal        Exercises General Exercises - Lower Extremity Long Arc Quad: AROM, Both, 10 reps, Seated Hip Flexion/Marching: AROM, Both, 10 reps, Seated    General Comments        Pertinent Vitals/Pain Pain Assessment Pain Assessment: Faces Faces Pain Scale: Hurts little more Pain Location: left forearm Pain Descriptors / Indicators: Grimacing Pain Intervention(s): Limited activity within patient's tolerance, Monitored during session, Repositioned    Home Living                          Prior Function            PT Goals (current goals can now be found in the care plan section) Progress towards PT goals: Progressing toward goals    Frequency    Min 4X/week      PT Plan Current plan remains appropriate    Co-evaluation               AM-PAC PT "6 Clicks" Mobility   Outcome Measure  Help needed turning from your back to your side while in a flat bed without using bedrails?: A Little Help needed moving from lying on your back to sitting on the side of a flat bed without using bedrails?: A Little Help needed moving to and from a bed to a chair (including a wheelchair)?: A Little Help needed standing up from a chair using your arms (e.g., wheelchair or bedside chair)?: A Little Help needed to walk in hospital room?: A Lot Help needed climbing 3-5 steps with a railing? : Total 6 Click Score: 15    End of Session   Activity Tolerance: Patient tolerated treatment well Patient left: in chair;with chair alarm set;with family/visitor present;with call bell/phone within reach Nurse Communication: Mobility status PT Visit Diagnosis: Unsteadiness on feet (R26.81);Other abnormalities of gait and mobility (R26.89);Difficulty in walking, not elsewhere classified (R26.2);Other symptoms and signs involving the nervous system (R29.898);Muscle weakness (generalized) (M62.81)     Time: 0626-9485 PT Time Calculation (min) (ACUTE ONLY): 34 min  Charges:  $Gait Training: 8-22 mins $Therapeutic Activity: 8-22 mins                     Hasana Alcorta P, PT Acute Rehabilitation Services  Office: [phone]    Cristine Polio 11/27/2021, 11:41 AM

## 2021-11-28 ENCOUNTER — Encounter (HOSPITAL_COMMUNITY): Payer: Self-pay

## 2021-11-28 DIAGNOSIS — R570 Cardiogenic shock: Secondary | ICD-10-CM | POA: Diagnosis not present

## 2021-11-28 DIAGNOSIS — N179 Acute kidney failure, unspecified: Secondary | ICD-10-CM | POA: Diagnosis not present

## 2021-11-28 DIAGNOSIS — I63412 Cerebral infarction due to embolism of left middle cerebral artery: Secondary | ICD-10-CM | POA: Diagnosis not present

## 2021-11-28 LAB — HEPATIC FUNCTION PANEL
ALT: 72 U/L — ABNORMAL HIGH (ref 0–44)
AST: 78 U/L — ABNORMAL HIGH (ref 15–41)
Albumin: 3 g/dL — ABNORMAL LOW (ref 3.5–5.0)
Alkaline Phosphatase: 58 U/L (ref 38–126)
Bilirubin, Direct: 0.6 mg/dL — ABNORMAL HIGH (ref 0.0–0.2)
Indirect Bilirubin: 1.5 mg/dL — ABNORMAL HIGH (ref 0.3–0.9)
Total Bilirubin: 2.1 mg/dL — ABNORMAL HIGH (ref 0.3–1.2)
Total Protein: 5.6 g/dL — ABNORMAL LOW (ref 6.5–8.1)

## 2021-11-28 LAB — COOXEMETRY PANEL
Carboxyhemoglobin: 1.1 % (ref 0.5–1.5)
Carboxyhemoglobin: 1.4 % (ref 0.5–1.5)
Methemoglobin: 0.9 % (ref 0.0–1.5)
Methemoglobin: 0.7 % (ref 0.0–1.5)
O2 Saturation: 46.5 %
O2 Saturation: 48.1 %
Total hemoglobin: 14.2 g/dL (ref 12.0–16.0)
Total hemoglobin: 14.3 g/dL (ref 12.0–16.0)

## 2021-11-28 LAB — GLUCOSE, CAPILLARY
Glucose-Capillary: 99 mg/dL (ref 70–99)
Glucose-Capillary: 101 mg/dL — ABNORMAL HIGH (ref 70–99)
Glucose-Capillary: 176 mg/dL — ABNORMAL HIGH (ref 70–99)
Glucose-Capillary: 125 mg/dL — ABNORMAL HIGH (ref 70–99)
Glucose-Capillary: 89 mg/dL (ref 70–99)

## 2021-11-28 LAB — CBC
HCT: 41.9 % (ref 39.0–52.0)
Hemoglobin: 14.2 g/dL (ref 13.0–17.0)
MCH: 29 pg (ref 26.0–34.0)
MCHC: 33.9 g/dL (ref 30.0–36.0)
MCV: 85.7 fL (ref 80.0–100.0)
Platelets: 177 10*3/uL (ref 150–400)
RBC: 4.89 MIL/uL (ref 4.22–5.81)
RDW: 13.8 % (ref 11.5–15.5)
WBC: 9.9 10*3/uL (ref 4.0–10.5)
nRBC: 0 % (ref 0.0–0.2)

## 2021-11-28 LAB — HEPARIN LEVEL (UNFRACTIONATED)
Heparin Unfractionated: 0.25 IU/mL — ABNORMAL LOW (ref 0.30–0.70)
Heparin Unfractionated: 0.27 IU/mL — ABNORMAL LOW (ref 0.30–0.70)

## 2021-11-28 LAB — BASIC METABOLIC PANEL
Anion gap: 13 (ref 5–15)
BUN: 49 mg/dL — ABNORMAL HIGH (ref 8–23)
CO2: 19 mmol/L — ABNORMAL LOW (ref 22–32)
Calcium: 8.4 mg/dL — ABNORMAL LOW (ref 8.9–10.3)
Chloride: 95 mmol/L — ABNORMAL LOW (ref 98–111)
Creatinine, Ser: 3.2 mg/dL — ABNORMAL HIGH (ref 0.61–1.24)
GFR, Estimated: 20 mL/min — ABNORMAL LOW (ref >60)
Glucose, Bld: 98 mg/dL (ref 70–99)
Potassium: 4.3 mmol/L (ref 3.5–5.1)
Sodium: 127 mmol/L — ABNORMAL LOW (ref 135–145)

## 2021-11-28 LAB — MAGNESIUM: Magnesium: 2.3 mg/dL (ref 1.7–2.4)

## 2021-11-28 LAB — PHOSPHORUS: Phosphorus: 6.3 mg/dL — ABNORMAL HIGH (ref 2.5–4.6)

## 2021-11-28 MED ORDER — BOOST / RESOURCE BREEZE PO LIQD CUSTOM
1.0000 | Freq: Three times a day (TID) | ORAL | Status: DC
  Administered 2021-11-29 – 2021-12-02 (×9): 1 via ORAL
  Administered 2021-12-02: 237 mL via ORAL
  Administered 2021-12-02 – 2021-12-05 (×8): 1 via ORAL

## 2021-11-28 MED ORDER — MIDODRINE HCL 5 MG PO TABS
5.0000 mg | ORAL_TABLET | Freq: Three times a day (TID) | ORAL | Status: DC
  Administered 2021-11-28: 5 mg via ORAL
  Filled 2021-11-28: qty 1

## 2021-11-28 MED ORDER — SIMETHICONE 80 MG PO CHEW
80.0000 mg | CHEWABLE_TABLET | Freq: Four times a day (QID) | ORAL | Status: DC
  Administered 2021-11-28 – 2021-12-09 (×40): 80 mg via ORAL
  Filled 2021-11-28 (×40): qty 1

## 2021-11-28 MED ORDER — METOLAZONE 2.5 MG PO TABS
2.5000 mg | ORAL_TABLET | Freq: Once | ORAL | Status: AC
  Administered 2021-11-28: 2.5 mg via ORAL
  Filled 2021-11-28: qty 1

## 2021-11-28 MED ORDER — ONDANSETRON HCL 4 MG/2ML IJ SOLN: 4.0000 mg | Freq: Once | INTRAMUSCULAR | Status: DC

## 2021-11-28 NOTE — Progress Notes (Signed)
ANTICOAGULATION CONSULT NOTE  Pharmacy Consult:  Heparin Indication: Aflutter  No Known Allergies  Patient Measurements: Height: 6' (182.9 cm) Weight: 91.4 kg (201 lb 8 oz) IBW/kg (Calculated) : 77.6 Heparin Dosing Weight: 86 kg  Vital Signs: Temp: 98.6 F (37 C) (07/31 2300) Temp Source: Oral (07/31 2300) BP: 128/108 (08/01 0000) Pulse Rate: 90 (08/01 0000)  Labs: Recent Labs    11/25/21 0229 11/25/21 2145 11/25/21 2145 11/26/21 0320 11/27/21 0433 11/27/21 1210 11/27/21 2359  HGB 14.7  --   --  14.4 14.0  --   --   HCT 44.4  --   --  42.1 40.6  --   --   PLT 157  --   --  164 160  --   --   APTT  --  73*  --   --   --   --   --   HEPARINUNFRC  --  0.40   < > 0.31 <0.10* <0.10* 0.25*  CREATININE 2.88*  --   --  3.54* 3.28*  --   --    < > = values in this interval not displayed.     Estimated Creatinine Clearance: 22.7 mL/min (A) (by C-G formula based on SCr of 3.28 mg/dL (H)).   Medical History: Past Medical History:  Diagnosis Date   CKD (chronic kidney disease), stage II    COVID-19 virus infection 03/2019   HFrEF (heart failure with reduced ejection fraction) (HCC)    a. 2011 Echo: EF 25-30%; b. 10/2016 Echo: EF 20-25%; c. 11/2019 Echo: EF 20-25%, mild LVH, Gr2 DD, sev dil LA, mod dil RA, mod MR, mild to mod AI, mod PR, Asc Ao 48mm.   Hypertension    NICM (nonischemic cardiomyopathy) (HCC)    a. 2011 Echo: EF 25-30%; b. 2011 MV: basal to dist inf perfusion defect->atten; c. 10/2016 Echo: EF 20-25%; d. 11/2019 Echo: EF 20-25%; e. 11/2019 MV: No ischemia. Fixed inflat defect, inflat HK, EF 17%; f. 03/2020 Cath: Nl cors. CO/CI 4.07/1.92.   PSVT (paroxysmal supraventricular tachycardia) (HCC)    a. 12/2016 Event monitor: rare, short episodes of SVT.    Assessment: 23 YOM with M2 infarct s/p mechanical thrombectomy on 7/28.  CT showed small volume SAH and extravasated contrast. Repeat CT on 7/29 shows regressed SAH; no hemorrhagic transformation. Heparin infusion  started for AF after discussion with neurology (pt on apixaban PTA but was noncompliant).  Heparin level of 0.25 is subtherapeutic on heparin 1400 units/hR. Level drawn appropriately. No bleeding noted per RN.   Goal of Therapy:  Heparin level 0.3-0.5 units/ml aPTT 66-85 seconds Monitor platelets by anticoagulation protocol: Yes   Plan:  Increase heparin to 1500 units/hr  Check 8 hr heparin level    Gerrit Halls, PharmD, BCPS Clinical Pharmacist 11/28/2021 12:37 AM

## 2021-11-28 NOTE — Progress Notes (Signed)
STROKE TEAM PROGRESS NOTE   INTERVAL HISTORY Patient is sitting up in bedside chair.  He continues to have expressive aphasia which is  unchanged..  No family at the bedside.  On IV heparing ggt. remains on dobutamine and Levophed drips to maintain map goal greater than 65.  And amiodarone drip for rate control for A-fib exam improving and now able to follow simple one-step and midline commands but unable to speak sentences Vitals:   11/28/21 0630 11/28/21 0645 11/28/21 0700 11/28/21 1137  BP: 125/75 125/75 110/74   Pulse: 90 90 87   Resp: (!) 31 (!) 26 (!) 26   Temp:    97.8 F (36.6 C)  TempSrc:    Axillary  SpO2: (!) 89% 91% 92%   Weight:      Height:       CBC:  Recent Labs  Lab 12/03/21 1617 11/09/2021 0639 11/27/21 0433 11/28/21 0321  WBC 7.3   < > 10.6* 9.9  NEUTROABS 4.6  --   --   --   HGB 15.2   < > 14.0 14.2  HCT 47.0   < > 40.6 41.9  MCV 89.0   < > 86.2 85.7  PLT 202   < > 160 177   < > = values in this interval not displayed.   Basic Metabolic Panel:  Recent Labs  Lab 11/27/21 0433 11/28/21 0321  NA 130* 127*  K 3.7 4.3  CL 96* 95*  CO2 24 19*  GLUCOSE 112* 98  BUN 47* 49*  CREATININE 3.28* 3.20*  CALCIUM 8.4* 8.4*  MG 2.1 2.3  PHOS 5.4* 6.3*   Lipid Panel:  Recent Labs  Lab 11/23/2021 0627  CHOL 150  TRIG 44  HDL 62  CHOLHDL 2.4  VLDL 9  LDLCALC 79   HgbA1c:  Recent Labs  Lab 10/29/2021 0640  HGBA1C 5.5   Urine Drug Screen: No results for input(s): "LABOPIA", "COCAINSCRNUR", "LABBENZ", "AMPHETMU", "THCU", "LABBARB" in the last 168 hours.  Alcohol Level  Recent Labs  Lab 12/03/2021 1617  ETH <10    IMAGING past 24 hours No results found.  PHYSICAL EXAM  Physical Exam  Constitutional: Appears well-developed and well-nourished elderly African-American male.   Cardiovascular: Normal rate and regular rhythm.  Respiratory: Effort normal, non-labored breathing  Neuro: Mental Status: Patient is awake and interactive.  He remains  aphasic and is able to speak only a few words but not sentences.  Very limited verbal output.  Unable to name or repeat.  He will open mouth, stick his tongue out. Open/close eyes. He was able to hold up both arms /legs off bed to command.  Cranial Nerves: II: Pupils are equal, round, and reactive to light.  Inconsistent blink to threat III,IV, VI: EOMI without ptosis or diploplia.  VII: Slight right facial droop VIII: Hearing is intact to voice XII: Tongue protrudes midline without atrophy or fasciculations.  Motor: Tone is normal. Bulk is normal.  Able to lift off extremities off bed, noted difficulty with right UE-however he as multiple IV lines placed.  Sensory: Intact to LT UE and  LE b/l. Cerebellar: No obvious ataxia with movements   ASSESSMENT/PLAN [name] is a 71 y.o. 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 On arrival to [facility] NIH increased from 3 to 6 and patient was taken to IR for a left M2 thrombectomy.  Cardiology consulted for milrinone and medication management. Currently requiring vasopressin and norepinephrine for BP management. Start heparin 7/29 given CT showing regression of SAH.   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 ASPECTS 10.    CTA head & neck Occluded proximal left M2 MCA vessel. Post IR CT contrast extraversion and SAH Repeat head CT- Small-volume acute subarachnoid hemorrhage/extravasated contrast along the left cerebral hemisphere has slightly increased  MRI moderate left MCA infarct 2D Echo EF 20%, global hypokinesis, aortic aneurysm 55mm LDL 79 HgbA1c 5.5 VTE prophylaxis - Lovenox Eliquis prior to admission, con't heparin IV per stroke protocol given significant  risk of thrombosis Therapy recommendations:  CIR Disposition:  Pending  Post IR SAH Post IR CT contrast extraversion and SAH Repeat head CT- Small-volume acute subarachnoid hemorrhage/extravasated contrast along the left cerebral hemisphere has slightly increased  BP goal < 160  Hx of Hypertension Hypotension  Home meds: Losartan- hold Per girlfriend, BP at home 90s BP goal 110-140  Requiring norepinephrine and vasopressin CCM on board  Atrial fibrillation  CHF Cardiomyopathy with low EF Home meds: Milrinone IV, Lasix, spironolactone, Eliquis Will need to resume anticoagulation given hx of A-fib and low EF Cardiology on board, appreciate help. Now on 2H  AKI on CKD 2/3 CCM managing.  Other Stroke Risk Factors Advanced Age >/= 68   Other Active Problems AICD placement Gout  Hospital day # 5  Patient remains aphasic and hemodynamically unstable requiring vasopressor support.  Continue IV heparin for his history of A-fib and low EF.  Will need transition to oral anticoagulation with Eliquis which suspect he was not being very compliant with prior to admission when patient is able to swallow safely.  Continue ongoing therapy and management of heart failure as per cardiology.  Stroke team will sign off.  Kindly call for questions.  Discussed with Dr. Merrily Pew critical care medicine and patient's family at the bedside and answered questions.This patient is critically ill and at significant risk of neurological worsening, death and care requires constant monitoring of vital signs, hemodynamics,respiratory and cardiac monitoring, extensive review of multiple databases, frequent neurological assessment, discussion with family, other specialists and medical decision making of high complexity.I have made any additions or clarifications directly to the above note.This critical care time does not reflect procedure time, or teaching time or supervisory time of PA/NP/Med Resident etc but could  involve care discussion time.  I spent 30 minutes of neurocritical care time  in the care of  this patient.  Delia Heady, MD   To contact Stroke Continuity provider, please refer to [email or web address]. After hours, contact General Neurology

## 2021-11-28 NOTE — Progress Notes (Signed)
Initial Nutrition Assessment  DOCUMENTATION CODES:   Not applicable  INTERVENTION:   Consider Cortrak placement if pt continues with minimal oral intake due to dysphagia, not swallowing liquids or solids.  Feeding assistance with frequent cues during meals.   Trial of Boost Breeze po TID, each supplement provides 250 kcal and 9 grams of protein   NUTRITION DIAGNOSIS:   Inadequate oral intake related to acute illness, dysphagia as evidenced by meal completion < 25% (not swallowing liquids or meds, regurgitating, aspiration risk).  GOAL:   Patient will meet greater than or equal to 90% of their needs   MONITOR:   Diet advancement, PO intake, Supplement acceptance, Labs, Weight trends  REASON FOR ASSESSMENT:   Consult Assessment of nutrition requirement/status  ASSESSMENT:   71 yo male admitted with L MCA stroke with right sided weakness and aphasia. PMH includes CKD, CHF EF 15-20%, HTN  Pt sitting up in chair on visit today, lunch tray in front of patient.   Pt with expressive aphasia. Pt following commands but did not speak on visit today.   SLP evaluated pt today and downgraded to [facility] diet. Pt is pocketing/holding food in mouth. RN reports pt swallow water ok but nothing else without multiple prompts. Pt took sip of soda on visit and RN asked pt to open his mouth and liquid still present. Required multiple cues from RN to get pt to swallow. Also noted pt did take some sips earlier but was suctioned back out   RD concerned that pt will not be able to safely take in any considerable amount of nutrition at this time.   Pt with Ensure and Nepro at bedside but not drinking per RN. RD discussed possibility of trying Boost Breeze for now as it is closer to the mouth feel of water.  Unable to obtain diet and weight history from patient at this time.   Labs: sodium 127 (L), Creatinine 3.20, BUN 49, phosphorus 6.3 (H) Meds: ss novolog, lasix gtt, dobutamine   Diet Order:    Diet Order             Diet full liquid Room service appropriate? Yes; Fluid consistency: Thin  Diet effective now                   EDUCATION NEEDS:   Not appropriate for education at this time  Skin:  Skin Assessment: Reviewed RN Assessment  Last BM:  7/31  Height:   Ht Readings from Last 1 Encounters:  10/28/2021 6' (1.829 m)    Weight:   Wt Readings from Last 1 Encounters:  11/28/21 88.5 kg    BMI:  Body mass index is 26.46 kg/m.  Estimated Nutritional Needs:   Kcal:  2000-2200 kcals  Protein:  100-115 g  Fluid:  1.8 L    Romelle Starcher MS, RDN, LDN, CNSC Registered Dietitian 3 Clinical Nutrition RD Pager and On-Call Pager Number Located in [place]

## 2021-11-28 NOTE — Evaluation (Signed)
Clinical/Bedside Swallow Evaluation Patient Details  Name: Steve Salazar MRN: [id number] Date of Birth: 10/26/1950  Today's Date: 11/28/2021 Time: SLP Start Time (ACUTE ONLY): 1130 SLP Stop Time (ACUTE ONLY): 1141 SLP Time Calculation (min) (ACUTE ONLY): 11 min  Past Medical History:  Past Medical History:  Diagnosis Date   CKD (chronic kidney disease), stage II    COVID-19 virus infection 03/2019   HFrEF (heart failure with reduced ejection fraction) (HCC)    a. 2011 Echo: EF 25-30%; b. 10/2016 Echo: EF 20-25%; c. 11/2019 Echo: EF 20-25%, mild LVH, Gr2 DD, sev dil LA, mod dil RA, mod MR, mild to mod AI, mod PR, Asc Ao 21mm.   Hypertension    NICM (nonischemic cardiomyopathy) (HCC)    a. 2011 Echo: EF 25-30%; b. 2011 MV: basal to dist inf perfusion defect->atten; c. 10/2016 Echo: EF 20-25%; d. 11/2019 Echo: EF 20-25%; e. 11/2019 MV: No ischemia. Fixed inflat defect, inflat HK, EF 17%; f. 03/2020 Cath: Nl cors. CO/CI 4.07/1.92.   PSVT (paroxysmal supraventricular tachycardia) (HCC)    a. 12/2016 Event monitor: rare, short episodes of SVT.   Past Surgical History:  Past Surgical History:  Procedure Laterality Date   BIV ICD INSERTION CRT-D N/A 05/13/2020   Procedure: BIV ICD INSERTION CRT-D;  Surgeon: [name], MD;  Location: [facility] INVASIVE CV LAB;  Service: Cardiovascular;  Laterality: N/A;   CARDIOVERSION N/A 10/16/2021   Procedure: CARDIOVERSION;  Surgeon: Iran Ouch, MD;  Location: [facility] ORS;  Service: Cardiovascular;  Laterality: N/A;   IR CT HEAD LTD  12-21-21   IR CT HEAD LTD  2021-12-21   IR PERCUTANEOUS ART THROMBECTOMY/INFUSION INTRACRANIAL INC DIAG ANGIO  12/21/2021   IR US GUIDE VASC ACCESS RIGHT  December 21, 2021   RADIOLOGY WITH ANESTHESIA N/A 12/21/2021   Procedure: IR WITH ANESTHESIA;  Surgeon: Julieanne Cotton, MD;  Location: [facility] OR;  Service: Radiology;  Laterality: N/A;   RIGHT/LEFT HEART CATH AND CORONARY ANGIOGRAPHY Bilateral 04/01/2020   Procedure: RIGHT/LEFT HEART  CATH AND CORONARY ANGIOGRAPHY;  Surgeon: [name], MD;  Location: [facility] INVASIVE CV LAB;  Service: Cardiovascular;  Laterality: Bilateral;   HPI:  71 y.o. male presented on 11/06/2021 with expressive aphasia - on work-up noted to have acute left MCA stroke with M2 occlusion, underwent mechanical thrombectomy, with TICI 2C revascularization, complicated with small amount of subarachnoid hemorrhage, acute on chronic CHF with cardiogenic shock on vasopressors, AKI on CKD, metabolic encephalopathy, shock liver. 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    Assessment / Plan / Recommendation  Clinical Impression  Pt presents with a moderate oral dysphagia.  Pt tolerated thin liquid by straw without clinical s/s of aspiration.  Seemingly delayed swallow initiation versus brief oral hold. N/V per RN.  Gagging with small amounts coming back up.  Pt declined solids from midday meal tray. Pt accepted a single piece of pear from mechanical soft fruit cup.  Oral manipulation observed but pt did not transit orally and eventually expectorated pear. Oral holding noted with puree as well. Pt did not benefit from verbal cues to swallow. With liquid wash, pt was noted to hold both puree and liquid orally. Suction used to clear oral cavity which triggered a small amount of regurgitation, which was cleared by suction.  Pt with frequent belching noted.  Pt endorses taking medication for reflux at home, and GERD is listed on active problems in chart, but  it is unclear if pt is able to answer questions accurately.  Consider medication for GERD, if indicated.  There appears to be a cognitive component to dysphagia, but nausea and vomiting may be impacting swallow function as well.     Recommend full liquid diet without puree, at present.  SLP to reassess once N/V resolves for possible advancement.  SLP Visit Diagnosis:  Dysphagia, oral phase (R13.11)    Aspiration Risk  Moderate aspiration risk    Diet Recommendation Thin liquid   Liquid Administration via: Cup;Straw Medication Administration: Whole meds with liquid (or crush with liquid) Supervision: Staff to assist with self feeding Compensations: Slow rate;Small sips/bites (Check oral cavity for holding/residue) Postural Changes: Seated upright at 90 degrees    Other  Recommendations Oral Care Recommendations: Oral care BID;Staff/trained caregiver to provide oral care Other Recommendations: Have oral suction available    Recommendations for follow up therapy are one component of a multi-disciplinary discharge planning process, led by the attending physician.  Recommendations may be updated based on patient status, additional functional criteria and insurance authorization.  Follow up Recommendations Acute inpatient rehab (3hours/day)      Assistance Recommended at Discharge Frequent or constant Supervision/Assistance  Functional Status Assessment Patient has had a recent decline in their functional status and demonstrates the ability to make significant improvements in function in a reasonable and predictable amount of time.  Frequency and Duration min 2x/week  2 weeks       Prognosis Prognosis for Safe Diet Advancement: Good      Swallow Study   General Date of Onset: 11/02/2021 HPI: 71 y.o. male presented on 11/10/2021 with expressive aphasia - on work-up noted to have acute left MCA stroke with M2 occlusion, underwent mechanical thrombectomy, with TICI 2C revascularization, complicated with small amount of subarachnoid hemorrhage, acute on chronic CHF with cardiogenic shock on vasopressors, AKI on CKD, metabolic encephalopathy, shock liver. 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 Type of Study: Bedside Swallow Evaluation Diet Prior to this Study:  Regular Temperature Spikes Noted: No Respiratory Status: Room air History of Recent Intubation: No Behavior/Cognition: Alert;Cooperative;Pleasant mood Oral Cavity Assessment: Within Functional Limits Oral Care Completed by SLP: No Oral Cavity - Dentition: Missing dentition;Edentulous (upper arch) Self-Feeding Abilities: Needs assist Patient Positioning: Upright in chair Baseline Vocal Quality: Normal Volitional Cough: Cognitively unable to elicit Volitional Swallow: Unable to elicit    Oral/Motor/Sensory Function Overall Oral Motor/Sensory Function: Mild impairment Facial ROM: Within Functional Limits Lingual ROM: Within Functional Limits Lingual Symmetry: Abnormal symmetry right Lingual Strength:  (suspected reduced R) Velum: Within Functional Limits Mandible: Within Functional Limits   Ice Chips Ice chips: Not tested   Thin Liquid Thin Liquid: Within functional limits Presentation: Straw    Nectar Thick Nectar Thick Liquid: Not tested   Honey Thick Honey Thick Liquid: Not tested   Puree Puree: Impaired Oral Phase Functional Implications: Oral holding   Solid     Solid: Impaired Oral Phase Functional Implications: Oral holding      Kerrie Pleasure, MA, CCC-SLP Acute Rehabilitation Services Office: [phone] 11/28/2021,11:57 AM

## 2021-11-28 NOTE — Progress Notes (Addendum)
Patient ID: Steve Salazar, male   DOB: 1950/11/01, 71 y.o.   MRN: [id number]     Advanced Heart Failure Rounding Note  PCP-Cardiologist: Ida Rogue, MD   Subjective:    7/31: Milrinone d/c 2/2 hypotension. Transitioned to DBA 5 + Lasix gtt at 15/hr. Midodrine added.   This morning, on DBA 5 + NE 18. Co-ox 47%. Stat repeat pending.   UOP improving, 2.9L out yesterday but only net negative 500 cc. Wt trending down. CVP 16. Minimal change in SCr past 24 hr, 3.54>>3.28>>3.20   Na 127   Continues on amio gtt. V paced, 90s. Less ventricular ectopy overnight.   C/w expressive aphasia but following commands. Walked some yesterday. Felt weak. No dyspnea.    Objective:   Weight Range: 88.5 kg Body mass index is 26.46 kg/m.   Vital Signs:   Temp:  [97.7 F (36.5 C)-98.6 F (37 C)] 98.6 F (37 C) (07/31 2300) Pulse Rate:  [68-94] 87 (08/01 0700) Resp:  [5-34] 26 (08/01 0700) BP: (84-149)/(64-108) 110/74 (08/01 0700) SpO2:  [88 %-100 %] 92 % (08/01 0700) Arterial Line BP: (25-250)/(22-245) 70/65 (08/01 0700) Weight:  [88.5 kg] 88.5 kg (08/01 0315) Last BM Date :  (PTA)  Weight change: Filed Weights   11/26/21 0400 11/27/21 0500 11/28/21 0315  Weight: 87.1 kg 91.4 kg 88.5 kg    Intake/Output:   Intake/Output Summary (Last 24 hours) at 11/28/2021 0734 Last data filed at 11/28/2021 0647 Gross per 24 hour  Intake 2399.57 ml  Output 2905 ml  Net -505.43 ml      Physical Exam    CVP 15-16  General:  fatigued appearing. No respiratory difficulty HEENT: dentition in poor repair  Neck: supple. JVD 16 cm. Carotids 2+ bilat; no bruits. No lymphadenopathy or thyromegaly appreciated. Cor: PMI nondisplaced. RRR. No rubs, gallops or murmurs. Lungs: decreased BS at the bases bilaterally  Abdomen: soft, nontender, nondistended. No hepatosplenomegaly. No bruits or masses. Good bowel sounds. Extremities: no cyanosis, clubbing, rash, 1+ b/l edema, distal extremities cool  Neuro:  alert & oriented x 3 Affect pleasant. + expressive aphasia   Telemetry   Atrial fibrillation with BiV pacing, PVCs and NSVT but less frequent overnight (personally reviewed)   Labs    CBC Recent Labs    11/27/21 0433 11/28/21 0321  WBC 10.6* 9.9  HGB 14.0 14.2  HCT 40.6 41.9  MCV 86.2 85.7  PLT 160 [id number]   Basic Metabolic Panel Recent Labs    11/27/21 0433 11/28/21 0321  NA 130* 127*  K 3.7 4.3  CL 96* 95*  CO2 24 19*  GLUCOSE 112* 98  BUN 47* 49*  CREATININE 3.28* 3.20*  CALCIUM 8.4* 8.4*  MG 2.1 2.3  PHOS 5.4* 6.3*   Liver Function Tests Recent Labs    11/27/21 0433 11/28/21 0321  AST 47* 78*  ALT 58* 72*  ALKPHOS 56 58  BILITOT 1.8* 2.1*  PROT 5.4* 5.6*  ALBUMIN 3.0* 3.0*   No results for input(s): "LIPASE", "AMYLASE" in the last 72 hours. Cardiac Enzymes No results for input(s): "CKTOTAL", "CKMB", "CKMBINDEX", "TROPONINI" in the last 72 hours.  BNP: BNP (last 3 results) No results for input(s): "BNP" in the last 8760 hours.  ProBNP (last 3 results) No results for input(s): "PROBNP" in the last 8760 hours.   D-Dimer No results for input(s): "DDIMER" in the last 72 hours. Hemoglobin A1C No results for input(s): "HGBA1C" in the last 72 hours.  Fasting Lipid Panel No results for  input(s): "CHOL", "HDL", "LDLCALC", "TRIG", "CHOLHDL", "LDLDIRECT" in the last 72 hours.  Thyroid Function Tests No results for input(s): "TSH", "T4TOTAL", "T3FREE", "THYROIDAB" in the last 72 hours.  Invalid input(s): "FREET3"  Other results:   Imaging    No results found.   Medications:     Scheduled Medications:  Chlorhexidine Gluconate Cloth  6 each Topical Daily   insulin aspart  0-15 Units Subcutaneous Q4H   midodrine  5 mg Oral Q8H   ondansetron (ZOFRAN) IV  4 mg Intravenous Once    Infusions:  sodium chloride     sodium chloride     sodium chloride     sodium chloride     amiodarone 60 mg/hr (11/28/21 0647)   dexmedetomidine (PRECEDEX) IV  infusion Stopped (11/26/21 6606)   DOBUTamine 5 mcg/kg/min (11/28/21 0647)   furosemide (LASIX) 200 mg in dextrose 5 % 100 mL (2 mg/mL) infusion 15 mg/hr (11/28/21 0726)   heparin 1,500 Units/hr (11/28/21 0725)   norepinephrine (LEVOPHED) Adult infusion 18 mcg/min (11/28/21 0647)    PRN Medications: Place/Maintain arterial line **AND** sodium chloride, Place/Maintain arterial line **AND** sodium chloride, acetaminophen **OR** acetaminophen (TYLENOL) oral liquid 160 mg/5 mL **OR** acetaminophen, mouth rinse, senna-docusate    Assessment/Plan   1. CVA: Acute left MCA CVA, likely cardioembolic.  Has dilated cardiomyopathy, also chronic atrial fibrillation. Echo did not show LV thrombus.  He is now s/p thrombectomy with residual small SAH. Per family, he probably was not taking his Eliquis at home.  He remains aphasic with right-sided weakness.  - He is now on heparin gtt.  Eventually, would resume Eliquis (not a drug failure as he apparently was not taking it).  2. Atrial fibrillation: Chronic, s/p AV nodal ablation.  Had successful DCCV in 6/23 but now back in AF.  - OK for heparin gtt now per neuro.  3. AKI on CKD stage 3: Baseline creatinine 1.8-2 range, suspect cardiorenal syndrome. SCr peaked up to 3.54 (patient initially was hypotensive and off his home inotrope). Restarted on milrinone but later discontinued due to soft BPs. Now on DBA 5 + NE 18. MAPs 70s. UOP picking up but minimal change in SCr past 24 hrs, 3.54>>3.28>>3.20. CVP 16 - continue to support CO w/ DBA and BP w/ NE - continue Lasix gtt at 15/hr + add metolazone 2.5 x 1   - follow BMP  4. Acute on chronic systolic CHF:  NICM, found as early as 2011.  Last cath in 12/21 with normal coronaries.  Echo this admission with EF 20%, severe LV dilation, severe biatrial enlargement, moderate RV enlargement with mildly decreased systolic function, mod-severe MR, moderate TR, dilated IVC.  Has [employer] CRT-D device.  He has known low  output/end stage HF, was started on milrinone gtt 0.2 at [facility] in 12/22.  He so far has refused LVAD.  He had been off milrinone initially after admission, co-ox was 40% with AKI.  Milrinone restarted 7/29 at 0.375 and NE added to maintain MAP (had been on vasopressin). Milrinone later discontinued due to soft BPs. Now on DBA 5 + NE 18. MAPs 70s. Co-ox low 46. UOP picking up but minimal change in SCr past 24 hrs, 3.54>>3.28>>3.20. CVP 16 - Repeat STAT Co-ox, if < 55% will increase DBA to 7.5  - Continue lasix gtt at 15/hr and add metolazone 2.5 x 1  - Continue NE for BP support   - There is consideration in notes about barostimulator placement, however not sure this would be very  helpful with such advanced HF (inotrope-dependent).  - Patient has refused LVAD in the past, now with large left MCA CVA.  He is not currently an LVAD candidate with the acute CVA (will need to see functional recovery), and given apparent lack of compliance with apixaban would be concerned about his compliance with warfarin if an LVAD were implanted. Concerned with rising creatinine that we are heading in the wrong direction.  Would continue aggressive medical management, but given lack of viable long-term end point (do not think LVAD candidate and has refused in past, now with expressive aphasia and confusion), would avoid temporary mechanical support.  Palliative care is following.  5. Mitral regurgitation: Moderate-severe functional MR.  LVESD > 7 cm, would not be Mitraclip candidate.  6. PVCs/NSVT: less frequent overnight. Continue amiodarone gtt at 60/hr while on dual inotropes  - keep K > 4.0 and Mg > 2.0    Length of Stay: [address] PA-C  11/28/2021, 7:34 AM  Advanced Heart Failure Team Pager [phone] (M-F; 7a - 5p)  Please contact [facility] for night-coverage after hours (5p -7a ) and weekends on [email or web address]  Patient seen with PA, agree with the above note.   Still with expressive aphasia but has  been walking on unit.  Currently on dobutamine 5 + NE 15.  Lasix gtt 15 mg/hr with improved UOP but still only 500 cc net negative. Creatinine mildly lower at 3.2.  Co-ox 47%, CVP 14 on my read.   General: NAD Neck: JVP 14 cm, no thyromegaly or thyroid nodule.  Lungs: Clear to auscultation bilaterally with normal respiratory effort. CV: Lateral PMI.  Heart regular S1/S2, no S3/S4, no murmur.  1+ edema 1/2 to knees bilaterally.  Abdomen: Soft, nontender, no hepatosplenomegaly, no distention.  Skin: Intact without lesions or rashes.  Neurologic: Alert and oriented x 3.  Psych: Normal affect. Extremities: No clubbing or cyanosis.  HEENT: Normal.   With low co-ox increase dobutamine to 7.5 and repeat co-ox.  Can wean down on NE, excellent MAP currently. Increase midodrine to 10 mg tid to help wean.   Less ectopy, can decrease amiodarone gtt to 30 mg/hr.    Creatinine fairly stable at 3.2. Still with volume overload.  Continue Lasix 15 mg/hr and will give metolazone 2.5 x 1.   Guarded long-term prognosis. Not candidate for advanced cardiac therapies at this point.   CVA likely due to noncompliance with Eliquis.  Now on heparin gtt.  Eventually, transition back to Eliquis.   CRITICAL CARE Performed by: Marca Ancona  Total critical care time: 40 minutes  Critical care time was exclusive of separately billable procedures and treating other patients.  Critical care was necessary to treat or prevent imminent or life-threatening deterioration.  Critical care was time spent personally by me on the following activities: development of treatment plan with patient and/or surrogate as well as nursing, discussions with consultants, evaluation of patient's response to treatment, examination of patient, obtaining history from patient or surrogate, ordering and performing treatments and interventions, ordering and review of laboratory studies, ordering and review of radiographic studies, pulse oximetry  and re-evaluation of patient's condition.  Marca Ancona. 11/28/2021 9:45 AM

## 2021-11-28 NOTE — Progress Notes (Signed)
Physical Therapy Treatment Patient Details Name: Steve Salazar MRN: [id number] DOB: 1950/10/20 Today's Date: 11/28/2021   History of Present Illness 71 y.o. male admitted to [facility] 11/25/2021 with acute aphasia, transfered to [facility] for thrombectomy of acute left M2 occlusion complicated with small amount of subarachnoid hemorrhage. PMHx:Gout, CKD2, NICM, HF with EF 15-20%, HTN, PSVT, ICD, Afib    PT Comments    Pt pleasant with girlfriend Steve Salazar present throughout session. Pt with continued expressive and receptive aphasia with noted increase in aphasia and decreased balance with fatigue and hypotension during gait. Pt with improved ability to follow single step commands for HEP but only able to follow 2 step commands 30% of the time. Pt remains limited by cognitive and physical deficits with AIR appropriate.   Walking 105/78 123/95 , HR 91 after gait   Recommendations for follow up therapy are one component of a multi-disciplinary discharge planning process, led by the attending physician.  Recommendations may be updated based on patient status, additional functional criteria and insurance authorization.  Follow Up Recommendations  Acute inpatient rehab (3hours/day)     Assistance Recommended at Discharge Frequent or constant Supervision/Assistance  Patient can return home with the following A lot of help with walking and/or transfers;A lot of help with bathing/dressing/bathroom;Assistance with cooking/housework;Direct supervision/assist for medications management;Assist for transportation;Direct supervision/assist for financial management;Help with stairs or ramp for entrance   Equipment Recommendations  Rolling walker (2 wheels);BSC/3in1    Recommendations for Other Services Speech consult;Rehab consult     Precautions / Restrictions Precautions Precautions: Fall;Other (comment) Precaution Comments: aphasia, R inattention; SBP 110-140,  2 iv poles     Mobility  Bed Mobility Overal  bed mobility: Needs Assistance Bed Mobility: Supine to Sit     Supine to sit: Min assist, HOB elevated     General bed mobility comments: HOB 20 degrees with assist to initiate and moving legs with limited assist to clear legs from bed, increased time to scoot to EOB with cues    Transfers Overall transfer level: Needs assistance   Transfers: Sit to/from Stand Sit to Stand: Min assist           General transfer comment: min assist for lines and safety with minor physical assist, mod cues for sitting with noted fatigue and increase aphasia    Ambulation/Gait Ambulation/Gait assistance: Min assist, +2 physical assistance Gait Distance (Feet): 220 Feet Assistive device: Rolling walker (2 wheels) Gait Pattern/deviations: Step-through pattern, Decreased stride length, Drifts right/left   Gait velocity interpretation: <1.8 ft/sec, indicate of risk for recurrent falls   General Gait Details: pt maintains RW too far to the left with veering right, decreased attention to right and limited by fatigue. Pt with good stride and balance with initial gait and with fatigue right lean, veering and stride all decline. Pt requires physical assist and chair follow to limit fatigue as pt with hypotension to 105/78 during gait at this time. with drop in BP pt unable to state name. Returned to room in chair and after 4 min recovery BP 123/95 and pt able to state name   Stairs             Wheelchair Mobility    Modified Rankin (Stroke Patients Only) Modified Rankin (Stroke Patients Only) Pre-Morbid Rankin Score: No symptoms Modified Rankin: Moderately severe disability     Balance Overall balance assessment: Needs assistance Sitting-balance support: No upper extremity supported, Feet supported Sitting balance-Leahy Scale: Fair Sitting balance - Comments: supervision static sitting  Standing balance support: Bilateral upper extremity supported Standing balance-Leahy Scale:  Poor Standing balance comment: pt able to stand with RW present with guarding for safety                            Cognition Arousal/Alertness: Awake/alert Behavior During Therapy: Flat affect Overall Cognitive Status: Impaired/Different from baseline Area of Impairment: Attention, Following commands, Safety/judgement, Awareness, Problem solving, Orientation, Memory                 Orientation Level: Disoriented to, Time, Place, Situation Current Attention Level: Sustained Memory: Decreased short-term memory Following Commands: Follows one step commands inconsistently, Follows one step commands with increased time Safety/Judgement: Decreased awareness of safety, Decreased awareness of deficits   Problem Solving: Slow processing General Comments: pt able to state girlfriend's name on arrival but could not appropriately answer orientation questions even with options. Pt following multimodal gestural cues grossly 60% of the time. Pt with fatigue demonstrates increased receptive and expressive aphasia        Exercises General Exercises - Lower Extremity Long Arc Quad: AROM, Both, 10 reps, Seated Hip Flexion/Marching: AROM, Both, 10 reps, Seated    General Comments        Pertinent Vitals/Pain Pain Assessment Pain Assessment: No/denies pain    Home Living                          Prior Function            PT Goals (current goals can now be found in the care plan section) Progress towards PT goals: Progressing toward goals    Frequency    Min 4X/week      PT Plan Current plan remains appropriate    Co-evaluation              AM-PAC PT "6 Clicks" Mobility   Outcome Measure  Help needed turning from your back to your side while in a flat bed without using bedrails?: A Little Help needed moving from lying on your back to sitting on the side of a flat bed without using bedrails?: A Little Help needed moving to and from a bed to a  chair (including a wheelchair)?: A Little Help needed standing up from a chair using your arms (e.g., wheelchair or bedside chair)?: A Little Help needed to walk in hospital room?: A Lot Help needed climbing 3-5 steps with a railing? : Total 6 Click Score: 15    End of Session Equipment Utilized During Treatment: Gait belt Activity Tolerance: Patient tolerated treatment well Patient left: in chair;with chair alarm set;with family/visitor present;with call bell/phone within reach Nurse Communication: Mobility status PT Visit Diagnosis: Unsteadiness on feet (R26.81);Other abnormalities of gait and mobility (R26.89);Difficulty in walking, not elsewhere classified (R26.2);Other symptoms and signs involving the nervous system (R29.898);Muscle weakness (generalized) (M62.81)     Time: 9357-0177 PT Time Calculation (min) (ACUTE ONLY): 28 min  Charges:  $Gait Training: 8-22 mins $Therapeutic Activity: 8-22 mins                     Merryl Hacker, PT Acute Rehabilitation Services Office: [phone]    Enedina Finner Jakhai Fant 11/28/2021, 10:29 AM

## 2021-11-28 NOTE — Progress Notes (Signed)
Palliative:  I met today with Steve Salazar but he is very sleepy today. No family at bedside. I attempted to call son Steve Salazar for update but no answer and no option for voicemail. I will reach out to sister Steve Salazar tomorrow.   No charge  Vinie Sill, NP Palliative Medicine Team Pager [phone] (Please see [email or web address] for schedule) Team Phone [phone]

## 2021-11-28 NOTE — Plan of Care (Signed)
Patient remains in CVICU. Weaned off of Norepi infusion by day shift [employer] fundraiser. BP remains above parameters for restarting Levo. Art line is very positional; titrations based on q 15 minutes NIBPs. CVC and Foley remain in place.   Problem: Education: Goal: Understanding of CV disease, CV risk reduction, and recovery process will improve Outcome: Progressing Goal: Individualized Educational Video(s) Outcome: Progressing   Problem: Activity: Goal: Ability to return to baseline activity level will improve Outcome: Progressing   Problem: Cardiovascular: Goal: Ability to achieve and maintain adequate cardiovascular perfusion will improve Outcome: Progressing Goal: Vascular access site(s) Level 0-1 will be maintained Outcome: Progressing   Problem: Health Behavior/Discharge Planning: Goal: Ability to safely manage health-related needs after discharge will improve Outcome: Progressing   Problem: Safety: Goal: Non-violent Restraint(s) Outcome: Progressing   Problem: Education: Goal: Ability to describe self-care measures that may prevent or decrease complications (Diabetes Survival Skills Education) will improve Outcome: Progressing Goal: Individualized Educational Video(s) Outcome: Progressing   Problem: Coping: Goal: Ability to adjust to condition or change in health will improve Outcome: Progressing   Problem: Fluid Volume: Goal: Ability to maintain a balanced intake and output will improve Outcome: Progressing   Problem: Health Behavior/Discharge Planning: Goal: Ability to identify and utilize available resources and services will improve Outcome: Progressing Goal: Ability to manage health-related needs will improve Outcome: Progressing   Problem: Metabolic: Goal: Ability to maintain appropriate glucose levels will improve Outcome: Progressing   Problem: Nutritional: Goal: Maintenance of adequate nutrition will improve Outcome: Progressing Goal: Progress toward achieving an  optimal weight will improve Outcome: Progressing   Problem: Skin Integrity: Goal: Risk for impaired skin integrity will decrease Outcome: Progressing   Problem: Tissue Perfusion: Goal: Adequacy of tissue perfusion will improve Outcome: Progressing   Problem: Education: Goal: Knowledge of General Education information will improve Description: Including pain rating scale, medication(s)/side effects and non-pharmacologic comfort measures Outcome: Progressing   Problem: Health Behavior/Discharge Planning: Goal: Ability to manage health-related needs will improve Outcome: Progressing   Problem: Clinical Measurements: Goal: Ability to maintain clinical measurements within normal limits will improve Outcome: Progressing Goal: Will remain free from infection Outcome: Progressing Goal: Diagnostic test results will improve Outcome: Progressing Goal: Respiratory complications will improve Outcome: Progressing Goal: Cardiovascular complication will be avoided Outcome: Progressing   Problem: Activity: Goal: Risk for activity intolerance will decrease Outcome: Progressing   Problem: Nutrition: Goal: Adequate nutrition will be maintained Outcome: Progressing   Problem: Coping: Goal: Level of anxiety will decrease Outcome: Progressing   Problem: Elimination: Goal: Will not experience complications related to bowel motility Outcome: Progressing Goal: Will not experience complications related to urinary retention Outcome: Progressing   Problem: Pain Managment: Goal: General experience of comfort will improve Outcome: Progressing   Problem: Safety: Goal: Ability to remain free from injury will improve Outcome: Progressing   Problem: Skin Integrity: Goal: Risk for impaired skin integrity will decrease Outcome: Progressing   Problem: Education: Goal: Knowledge of disease or condition will improve Outcome: Progressing Goal: Knowledge of secondary prevention will improve  (SELECT ALL) Outcome: Progressing Goal: Individualized Educational Video(s) Outcome: Progressing   Problem: Health Behavior/Discharge Planning: Goal: Ability to manage health-related needs will improve Outcome: Progressing   Problem: Self-Care: Goal: Ability to participate in self-care as condition permits will improve Outcome: Progressing Goal: Ability to communicate needs accurately will improve Outcome: Progressing   Problem: Nutrition: Goal: Risk of aspiration will decrease Outcome: Progressing Goal: Dietary intake will improve Outcome: Progressing   Problem: Ischemic Stroke/TIA Tissue Perfusion: Goal: Complications  of ischemic stroke/TIA will be minimized Outcome: Progressing   Problem: Spontaneous Subarachnoid Hemorrhage Tissue Perfusion: Goal: Complications of Spontaneous Subarachnoid Hemorrhage will be minimized Outcome: Progressing

## 2021-11-28 NOTE — Progress Notes (Signed)
ANTICOAGULATION CONSULT NOTE  Pharmacy Consult:  Heparin Indication: Aflutter  No Known Allergies  Patient Measurements: Height: 6' (182.9 cm) Weight: 88.5 kg (195 lb 1.7 oz) IBW/kg (Calculated) : 77.6 Heparin Dosing Weight: 86 kg  Vital Signs: Temp: 97.8 F (36.6 C) (08/01 1137) Temp Source: Axillary (08/01 1137) BP: 110/74 (08/01 0700) Pulse Rate: 87 (08/01 0700)  Labs: Recent Labs    11/25/21 2145 11/25/21 2145 11/26/21 0320 11/27/21 0433 11/27/21 1210 11/27/21 2359 11/28/21 0321 11/28/21 1139  HGB  --    < > 14.4 14.0  --   --  14.2  --   HCT  --   --  42.1 40.6  --   --  41.9  --   PLT  --   --  164 160  --   --  177  --   APTT 73*  --   --   --   --   --   --   --   HEPARINUNFRC 0.40  --  0.31 <0.10* <0.10* 0.25*  --  0.27*  CREATININE  --   --  3.54* 3.28*  --   --  3.20*  --    < > = values in this interval not displayed.     Estimated Creatinine Clearance: 23.2 mL/min (A) (by C-G formula based on SCr of 3.2 mg/dL (H)).   Medical History: Past Medical History:  Diagnosis Date   CKD (chronic kidney disease), stage II    COVID-19 virus infection 03/2019   HFrEF (heart failure with reduced ejection fraction) (HCC)    a. 2011 Echo: EF 25-30%; b. 10/2016 Echo: EF 20-25%; c. 11/2019 Echo: EF 20-25%, mild LVH, Gr2 DD, sev dil LA, mod dil RA, mod MR, mild to mod AI, mod PR, Asc Ao 65mm.   Hypertension    NICM (nonischemic cardiomyopathy) (HCC)    a. 2011 Echo: EF 25-30%; b. 2011 MV: basal to dist inf perfusion defect->atten; c. 10/2016 Echo: EF 20-25%; d. 11/2019 Echo: EF 20-25%; e. 11/2019 MV: No ischemia. Fixed inflat defect, inflat HK, EF 17%; f. 03/2020 Cath: Nl cors. CO/CI 4.07/1.92.   PSVT (paroxysmal supraventricular tachycardia) (HCC)    a. 12/2016 Event monitor: rare, short episodes of SVT.    Assessment: 27 YOM with M2 infarct s/p mechanical thrombectomy on 7/28.  CT showed small volume SAH and extravasated contrast. Repeat CT on 7/29 shows regressed  SAH; no hemorrhagic transformation. Heparin infusion started for AF after discussion with neurology (pt on apixaban PTA but was noncompliant).  Heparin level of 0.27 is subtherapeutic on heparin 1500 units/hR. Level drawn appropriately. No bleeding noted per RN.   Goal of Therapy:  Heparin level 0.3-0.5 units/ml aPTT 66-85 seconds Monitor platelets by anticoagulation protocol: Yes   Plan:  Increase heparin to 1600 units/hr  Daily heparin level and CBC  Monitor s/s bleeding     Leota Sauers Pharm.D. CPP, BCPS Clinical Pharmacist [phone] 11/28/2021 1:53 PM

## 2021-11-28 NOTE — Progress Notes (Signed)
NAME:  Steve Salazar, MRN:  [id number], DOB:  12-15-1950, LOS: 5 ADMISSION DATE:  2021/11/29, CONSULTATION DATE: 11/17/2021 REFERRING MD: Dr. 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  Patient remained hypotensive, required initiation of vasopressor, PCCM was consulted for help evaluation and medical management  Pertinent  Medical History   Past Medical History:  Diagnosis Date   CKD (chronic kidney disease), stage II    COVID-19 virus infection 03/2019   HFrEF (heart failure with reduced ejection fraction) (HCC)    a. 2011 Echo: EF 25-30%; b. 10/2016 Echo: EF 20-25%; c. 11/2019 Echo: EF 20-25%, mild LVH, Gr2 DD, sev dil LA, mod dil RA, mod MR, mild to mod AI, mod PR, Asc Ao 13mm.   Hypertension    NICM (nonischemic cardiomyopathy) (HCC)    a. 2011 Echo: EF 25-30%; b. 2011 MV: basal to dist inf perfusion defect->atten; c. 10/2016 Echo: EF 20-25%; d. 11/2019 Echo: EF 20-25%; e. 11/2019 MV: No ischemia. Fixed inflat defect, inflat HK, EF 17%; f. 03/2020 Cath: Nl cors. CO/CI 4.07/1.92.   PSVT (paroxysmal supraventricular tachycardia) (HCC)    a. 12/2016 Event monitor: rare, short episodes of SVT.    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 Renal function and LFTs both elevated   Interim History / Subjective:   Patient remains on dobutamine and Levophed to maintain map goal 65 Serum creatinine did not change much, though urine output has increased   Objective   Blood pressure 110/74, pulse 87, temperature 98.6 F (37 C), temperature source Oral, resp. rate (!) 26, height 6' (1.829 m), weight 88.5 kg, SpO2 92 %. CVP:  [9 mmHg-16 mmHg] 9 mmHg      Intake/Output Summary (Last 24 hours) at 11/28/2021 1012 Last data filed at 11/28/2021 2706 Gross per 24 hour  Intake 1809.56 ml  Output 2905 ml  Net -1095.44 ml   Filed Weights   11/26/21 0400 11/27/21 0500 11/28/21 0315  Weight: 87.1 kg 91.4 kg 88.5 kg    Examination: Physical exam: General: Acute on chronically ill-appearing male, lying on the bed HEENT: [place]/AT, eyes anicteric.  moist mucus membranes Neuro: Alert, awake following commands.  Expressive aphasia, antigravity in all 4 extremities Chest: Coarse breath sounds, no wheezes or rhonchi Heart: Irregularly irregular, no murmurs or gallops Abdomen: Soft, nontender, nondistended, bowel sounds present Skin: No rash  LFTs trended up and serum creatinine stable at 3.2  Resolved Hospital Problem list     Assessment & Plan:  Acute left MCA stroke s/p thrombectomy Etiology cardioembolic in the setting of afib one eliquis and cardiomyopathy with low EF.  Residual deficits include aphasia and slight R sided weakness Continue secondary stroke prophylaxis Stroke team is following Echo was negative for LV thrombus  Postprocedure small subarachnoid hemorrhage-MRI confirmed left MCA territory stroke with small amount of subarachnoid hemorrhage; improved on f/u imaging Continue neuro watch Neurology is following  Acute on chronic systolic CHF with cardiogenic shock Shock  liver Appreciate heart failure team follow-up Continue dobutamine Coox dropped to 47 percent Continue to require Levophed with map goal 65 Urine output has improved, made 2.9 L LFTs trended up, closely monitor  Chronic  atrial fibrillation-Anticoagulated at baseline with Eliquis Heart rate remain well controlled Continue amiodarone Continue IV heparin for now, will switch it to Eliquis  AKI on CKD stage III a due to cardiorenal syndrome- worsened by dye load Frequent ectopy Serum creatinine remain elevated monitor serum creatinine Avoid nephrotoxic agents Monitor intake and output  Hypovolemic hyponatremia Sodium is at 130 Closely monitor serum sodium  Dysphagia due to stroke Patient's family stated that patient has been having cough any chokes when he eats Swallow evaluation is requested  Best Practice (right click and "Reselect all SmartList Selections" daily)   Diet/type: Swallow evaluation DVT prophylaxis: Systemic heparin GI prophylaxis: N/A Lines: yes and it is still needed as port Foley:  Yes, and it is still needed Code Status:  full code Last date of multidisciplinary goals of care discussion: 7/29 see IPAL note   Total critical care time: 33 minutes  Performed by: Cheri Fowler   Critical care time was exclusive of separately billable procedures and treating other patients.   Critical care was necessary to treat or prevent imminent or life-threatening deterioration.   Critical care was time spent personally by me on the following activities: development of treatment plan with patient and/or surrogate as well as nursing, discussions with consultants, evaluation of patient's response to treatment, examination of patient, obtaining history from patient or surrogate, ordering and performing treatments and interventions, ordering and review of laboratory studies, ordering and review of radiographic studies, pulse oximetry and re-evaluation of patient's condition.   Cheri Fowler, MD [facility] See Amion for pager If no response to pager, please call [phone] until 7pm After 7pm, Please call E-link [phone]

## 2021-11-28 DEATH — deceased

## 2021-11-29 DIAGNOSIS — Z515 Encounter for palliative care: Secondary | ICD-10-CM | POA: Diagnosis not present

## 2021-11-29 DIAGNOSIS — I4811 Longstanding persistent atrial fibrillation: Secondary | ICD-10-CM

## 2021-11-29 DIAGNOSIS — R570 Cardiogenic shock: Secondary | ICD-10-CM | POA: Diagnosis not present

## 2021-11-29 DIAGNOSIS — I63412 Cerebral infarction due to embolism of left middle cerebral artery: Secondary | ICD-10-CM | POA: Diagnosis not present

## 2021-11-29 DIAGNOSIS — Z7189 Other specified counseling: Secondary | ICD-10-CM | POA: Diagnosis not present

## 2021-11-29 LAB — BASIC METABOLIC PANEL
Anion gap: 12 (ref 5–15)
BUN: 51 mg/dL — ABNORMAL HIGH (ref 8–23)
CO2: 23 mmol/L (ref 22–32)
Calcium: 8.5 mg/dL — ABNORMAL LOW (ref 8.9–10.3)
Chloride: 94 mmol/L — ABNORMAL LOW (ref 98–111)
Creatinine, Ser: 3.34 mg/dL — ABNORMAL HIGH (ref 0.61–1.24)
GFR, Estimated: 19 mL/min — ABNORMAL LOW (ref >60)
Glucose, Bld: 103 mg/dL — ABNORMAL HIGH (ref 70–99)
Potassium: 3.7 mmol/L (ref 3.5–5.1)
Sodium: 129 mmol/L — ABNORMAL LOW (ref 135–145)

## 2021-11-29 LAB — HEPARIN LEVEL (UNFRACTIONATED): Heparin Unfractionated: 0.31 IU/mL (ref 0.30–0.70)

## 2021-11-29 LAB — COOXEMETRY PANEL
Carboxyhemoglobin: 1.8 % — ABNORMAL HIGH (ref 0.5–1.5)
Carboxyhemoglobin: 1.4 % (ref 0.5–1.5)
Methemoglobin: <0.7 % (ref 0.0–1.5)
Methemoglobin: 0.9 % (ref 0.0–1.5)
O2 Saturation: 73.3 %
O2 Saturation: 66.1 %
Total hemoglobin: 13.8 g/dL (ref 12.0–16.0)
Total hemoglobin: 13.5 g/dL (ref 12.0–16.0)

## 2021-11-29 LAB — CBC
HCT: 39.4 % (ref 39.0–52.0)
Hemoglobin: 13.4 g/dL (ref 13.0–17.0)
MCH: 29.3 pg (ref 26.0–34.0)
MCHC: 34 g/dL (ref 30.0–36.0)
MCV: 86.2 fL (ref 80.0–100.0)
Platelets: 169 10*3/uL (ref 150–400)
RBC: 4.57 MIL/uL (ref 4.22–5.81)
RDW: 13.9 % (ref 11.5–15.5)
WBC: 7.1 10*3/uL (ref 4.0–10.5)
nRBC: 0.4 % — ABNORMAL HIGH (ref 0.0–0.2)

## 2021-11-29 LAB — HEPATIC FUNCTION PANEL
ALT: 78 U/L — ABNORMAL HIGH (ref 0–44)
AST: 52 U/L — ABNORMAL HIGH (ref 15–41)
Albumin: 2.9 g/dL — ABNORMAL LOW (ref 3.5–5.0)
Alkaline Phosphatase: 57 U/L (ref 38–126)
Bilirubin, Direct: 0.6 mg/dL — ABNORMAL HIGH (ref 0.0–0.2)
Indirect Bilirubin: 1.8 mg/dL — ABNORMAL HIGH (ref 0.3–0.9)
Total Bilirubin: 2.4 mg/dL — ABNORMAL HIGH (ref 0.3–1.2)
Total Protein: 5.5 g/dL — ABNORMAL LOW (ref 6.5–8.1)

## 2021-11-29 LAB — GLUCOSE, CAPILLARY
Glucose-Capillary: 101 mg/dL — ABNORMAL HIGH (ref 70–99)
Glucose-Capillary: 106 mg/dL — ABNORMAL HIGH (ref 70–99)
Glucose-Capillary: 110 mg/dL — ABNORMAL HIGH (ref 70–99)
Glucose-Capillary: 145 mg/dL — ABNORMAL HIGH (ref 70–99)
Glucose-Capillary: 71 mg/dL (ref 70–99)
Glucose-Capillary: 91 mg/dL (ref 70–99)
Glucose-Capillary: 97 mg/dL (ref 70–99)

## 2021-11-29 LAB — PHOSPHORUS: Phosphorus: 6.4 mg/dL — ABNORMAL HIGH (ref 2.5–4.6)

## 2021-11-29 LAB — MAGNESIUM: Magnesium: 2.2 mg/dL (ref 1.7–2.4)

## 2021-11-29 MED ORDER — METOLAZONE 2.5 MG PO TABS
2.5000 mg | ORAL_TABLET | Freq: Once | ORAL | Status: AC
  Administered 2021-11-29: 2.5 mg via ORAL
  Filled 2021-11-29: qty 1

## 2021-11-29 MED ORDER — OXYBUTYNIN CHLORIDE 5 MG PO TABS
5.0000 mg | ORAL_TABLET | Freq: Three times a day (TID) | ORAL | Status: DC
  Administered 2021-11-29 – 2021-12-09 (×29): 5 mg via ORAL
  Filled 2021-11-29 (×30): qty 1

## 2021-11-29 MED ORDER — POTASSIUM CHLORIDE 20 MEQ PO PACK
40.0000 meq | PACK | Freq: Once | ORAL | Status: AC
Start: 2021-11-29 — End: 2021-11-29
  Administered 2021-11-29: 40 meq via ORAL
  Filled 2021-11-29: qty 2

## 2021-11-29 MED ORDER — MIDODRINE HCL 5 MG PO TABS
10.0000 mg | ORAL_TABLET | Freq: Three times a day (TID) | ORAL | Status: DC
Start: 2021-11-29 — End: 2021-12-14
  Administered 2021-11-29 – 2021-12-13 (×37): 10 mg via ORAL
  Filled 2021-11-29 (×40): qty 2

## 2021-11-29 NOTE — Progress Notes (Signed)
ANTICOAGULATION CONSULT NOTE  Pharmacy Consult:  Heparin Indication: Aflutter  No Known Allergies  Patient Measurements: Height: 6' (182.9 cm) Weight: 88.4 kg (194 lb 14.2 oz) IBW/kg (Calculated) : 77.6 Heparin Dosing Weight: 86 kg  Vital Signs: Temp: 97.9 F (36.6 C) (08/02 0815) Temp Source: Axillary (08/02 0815) BP: 102/83 (08/02 0700) Pulse Rate: 90 (08/02 0700)  Labs: Recent Labs    11/27/21 0433 11/27/21 1210 11/27/21 2359 11/28/21 0321 11/28/21 1139 11/29/21 0412  HGB 14.0  --   --  14.2  --  13.4  HCT 40.6  --   --  41.9  --  39.4  PLT 160  --   --  177  --  169  HEPARINUNFRC <0.10*   < > 0.25*  --  0.27* 0.31  CREATININE 3.28*  --   --  3.20*  --  3.34*   < > = values in this interval not displayed.     Estimated Creatinine Clearance: 22.3 mL/min (A) (by C-G formula based on SCr of 3.34 mg/dL (H)).   Medical History: Past Medical History:  Diagnosis Date   CKD (chronic kidney disease), stage II    COVID-19 virus infection 03/2019   HFrEF (heart failure with reduced ejection fraction) (HCC)    a. 2011 Echo: EF 25-30%; b. 10/2016 Echo: EF 20-25%; c. 11/2019 Echo: EF 20-25%, mild LVH, Gr2 DD, sev dil LA, mod dil RA, mod MR, mild to mod AI, mod PR, Asc Ao 62mm.   Hypertension    NICM (nonischemic cardiomyopathy) (HCC)    a. 2011 Echo: EF 25-30%; b. 2011 MV: basal to dist inf perfusion defect->atten; c. 10/2016 Echo: EF 20-25%; d. 11/2019 Echo: EF 20-25%; e. 11/2019 MV: No ischemia. Fixed inflat defect, inflat HK, EF 17%; f. 03/2020 Cath: Nl cors. CO/CI 4.07/1.92.   PSVT (paroxysmal supraventricular tachycardia) (HCC)    a. 12/2016 Event monitor: rare, short episodes of SVT.    Assessment: 53 YOM with M2 infarct s/p mechanical thrombectomy on 7/28.  CT showed small volume SAH and extravasated contrast. Repeat CT on 7/29 shows regressed SAH; no hemorrhagic transformation. Heparin infusion started for AF after discussion with neurology (pt on apixaban PTA but was  noncompliant).  Heparin level of 0.31 is therapeutic on heparin 1600 units/hR. Level drawn appropriately. No bleeding noted per RN. CBC stable   Goal of Therapy:  Heparin level 0.3-0.5 units/ml aPTT 66-85 seconds Monitor platelets by anticoagulation protocol: Yes   Plan:  Continue heparin at 1600 units/hr  Daily heparin level and CBC  Monitor s/s bleeding     Leota Sauers Pharm.D. CPP, BCPS Clinical Pharmacist [phone] 11/29/2021 8:22 AM

## 2021-11-29 NOTE — Progress Notes (Signed)
Speech Language Pathology Treatment: Dysphagia;Cognitive-Linquistic  Patient Details Name: Steve Salazar MRN: [id number] DOB: 1951-04-10 Today's Date: 11/29/2021 Time: 1132-1203 SLP Time Calculation (min) (ACUTE ONLY): 31 min  Assessment / Plan / Recommendation Clinical Impression  SWALLOWING Pt seen for ongoing dysphagia management.  Pt was made NPO 2/2 pocketing yesterday even with liquids.  Today pt accepted thin liquid by straw and spoon (ice cream) and puree by spoon.  Pt exhibited gagging followed by expectoration on the second of 2 puree trials, but swallowed the first without difficulty, exhibiting good oral clearance.  There was frequent belching noted. Pt took sips of thin liquid by straw without any overt s/s of aspiration.  There was only a brief delay prior to pharyngeal swallow reflex.  Family arrived during session and mentioned that pt had been requesting ice cream.  Pt consumed 4-6 trials of thin liquid ice cream with SLP with good tolerance and oral clearance with slightly prolonged oral phase.  [name] dysphagia appears to be cognitive in nature versus the result of N/V or overactive gag reflex.  There were no clinical indicators of pharyngeal dysphagia with consistencies trialed today.  Recommend resuming full liquid diet without purees.   COMMUNICATION [name] receptive and expressive language is improving as compared to 7/30.  Pt followed 1 step commands with 80% accuracy independently with repetition (x1), improving to 100% with visual model. Pt noted to perseverate on prior instruction x1.  Pt followed 2 step commands with SLP visual model. Pt completed object identification task from field of 2 with 80% accuracy.  Pt answered personal and immediate environment Yes-No questions with 80% accuracy with repetition and visual cue for attention.  [name] expressive language for brief social exchange was appropriate.  Pt is demonstrated emerging frustration and awareness of verbal  communication errors.  Pt completed naming task with 30% accuracy independently, he benefited from phonemic cue x1. Pt demonstrated circumlocution in one instance.  When asked to name chair pt stated "sit" and then with sentence completion provided the word "seat" instead of chair.  With repetition task, pt's length of utterance is increasing.  Pt was 100% accurate for single and compound words and 1 short phrase.  With phrases and sentences pt was able to repeat portions, or convey general meaning.  For "The telephone is ringing" pt stated "It's ringing" x2.  Pt repeated "Today is a sunny day" as "Today is a summer day" and attempted revision of "Summer" without success.  Pt would benefit from ongoing ST to address aphasia in house and and at next level of care.  Pt appears to be a good candidate for inpatient rehab.     HPI HPI: 71 y.o. 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      SLP Plan  Continue with current plan of care      Recommendations for follow up therapy are one component of a multi-disciplinary discharge planning process, led by the attending physician.  Recommendations may be updated based on patient status, additional functional criteria and insurance authorization.    Recommendations  Diet recommendations: Thin liquid Liquids provided via: Cup;Straw Medication Administration: Whole  meds with liquid (or crush with liquid) Supervision: Staff to assist with self feeding;Trained caregiver to feed patient;Full supervision/cueing for  compensatory strategies Compensations: Slow rate;Small sips/bites (Check oral cavity for food; Have suction available)                Oral Care Recommendations: Oral care BID;Staff/trained caregiver to provide oral care Follow Up Recommendations: Acute inpatient rehab (3hours/day) Assistance recommended at discharge: Frequent or constant Supervision/Assistance SLP Visit Diagnosis: Dysphagia, oral phase (R13.11);Aphasia (R47.01) Plan: Continue with current plan of care           Kerrie Pleasure, MA, CCC-SLP Acute Rehabilitation Services Office: [phone] 11/29/2021, 12:08 PM

## 2021-11-29 NOTE — TOC Progression Note (Signed)
Transition of Care [facility]) - Progression Note    Patient Details  Name: Steve Salazar MRN: [id number] Date of Birth: 1950/06/15  [facility] CM/SW Contact  Delilah Shan, LCSWA Phone Number: 11/29/2021, 1:15 PM  Clinical Narrative:     Patients sister gave permission to fax out initial referral for possible SNF placement near the [place] area. CSW faxed patient out for possible SNF placement. CSW will continue to follow to start insurance authorization closer to patient being medically ready for dc.  Expected Discharge Plan: Skilled Nursing Facility Barriers to Discharge: SNF Pending bed offer  Expected Discharge Plan and Services Expected Discharge Plan: Skilled Nursing Facility     Post Acute Care Choice: Skilled Nursing Facility Living arrangements for the past 2 months: Single Family Home                                       Social Determinants of Health (SDOH) Interventions    Readmission Risk Interventions     No data to display

## 2021-11-29 NOTE — Progress Notes (Signed)
Palliative:  HPI: 71 y.o. 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 He was admitted on 10/31/2021 with acute left MCA stroke status post thrombectomy, post procedure small subarachnoid hemorrhage, acute on chronic CHF with cardiogenic shock on vasopressors, AKI on CKD, metabolic encephalopathy, shock liver, and others. Palliative was consulted for goals of care conversations.  I met today with Steve Salazar and brother, Steve Salazar, is on his way out of the room. I met again with Steve Salazar. He is up in chair. He is more verbal and able to better express himself today although still with some expressive aphasia but this does seem improved compared to previous days. Discussed concern for his swallowing and holding food/drink but he indicates this is better today (RN confirms). Steve Salazar is appropriate and understanding although poor understanding of his overall poor prognosis. He gives me permission to call and speak with sister, Steve Salazar.   I called and had a good discussion with Steve Salazar. Steve Salazar has excellent understanding of Steve Salazar's poor prognosis. Steve Salazar has been present with Steve Salazar during his cardiology appointments and shares that he has been told frankly about his poor prognosis. We discussed milrinone and once this begins prognosis usually ~1 year or less and she shares that this was told to them both. She shares that they explained LVAD and Offie did not wish to proceed. Steve Salazar shares that she is trying to explain to the rest of the family Steve Salazar's poor prognosis from his heart and now kidneys and acknowledges that his stroke is the least of his concerns at this stage. I shared with Steve Salazar  that I did have a conversation with son Steve Salazar and I explained this to him. 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 I discussed with Steve Salazar if there have been any discussions regarding resuscitation and code status. Steve Salazar shares that Steve Salazar has always requested "everything to be done." She understands that he would not be expected to survive a resuscitative event. Steve Salazar has plans to meet with the rest of the family (Steve Salazar's other siblings and his children) on Friday and explain to them poor prognosis and discuss potential DNR status. I offered Steve Salazar assistance from palliative team to help with conversation as desired. Steve Salazar has our contact information and plans to keep [place] updated.   All questions/concerns addressed. Emotional support provided.   Exam: Alert, seems oriented although limited verbal communication. Nodding head appropriately. Sitting up in recliner. More fatigued today. Abd soft. Moves all extremities.    Plan: - Ongoing palliative conversation.  - Family planning to meet Friday to discuss code status and poor prognosis.   [place], NP Palliative Medicine Team Pager [phone] (Please see [email or web address] for schedule) Team Phone [phone]    Greater than 50%  of this time was spent counseling and coordinating care related to the above assessment and plan

## 2021-11-29 NOTE — Progress Notes (Signed)
NAME:  Steve Salazar, MRN:  [id number], DOB:  Apr 08, 1951, LOS: 6 ADMISSION DATE:  11/22/2021, CONSULTATION DATE: 07-Dec-2021 REFERRING MD: Dr. 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  Patient remained hypotensive, required initiation of vasopressor, PCCM was consulted for help evaluation and medical management  Pertinent  Medical History   Past Medical History:  Diagnosis Date   CKD (chronic kidney disease), stage II    COVID-19 virus infection 03/2019   HFrEF (heart failure with reduced ejection fraction) (HCC)    a. 2011 Echo: EF 25-30%; b. 10/2016 Echo: EF 20-25%; c. 11/2019 Echo: EF 20-25%, mild LVH, Gr2 DD, sev dil LA, mod dil RA, mod MR, mild to mod AI, mod PR, Asc Ao 73mm.   Hypertension    NICM (nonischemic cardiomyopathy) (HCC)    a. 2011 Echo: EF 25-30%; b. 2011 MV: basal to dist inf perfusion defect->atten; c. 10/2016 Echo: EF 20-25%; d. 11/2019 Echo: EF 20-25%; e. 11/2019 MV: No ischemia. Fixed inflat defect, inflat HK, EF 17%; f. 03/2020 Cath: Nl cors. CO/CI 4.07/1.92.   PSVT (paroxysmal supraventricular tachycardia) (HCC)    a. 12/2016 Event monitor: rare, short episodes of SVT.    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 Renal function and LFTs both elevated   Interim History / Subjective:   Dobutamine was increased to 7.5 with addition of metolazone, remain on Lasix infusion Coox improved to 73% Serum creatinine did not change much, though urine output has increased  Objective   Blood pressure 102/83, pulse 90, temperature 97.9 F (36.6 C), temperature source Axillary, resp. rate 15, height 6' (1.829 m), weight 88.4 kg, SpO2 95 %. CVP:  [2 mmHg-25 mmHg] 11 mmHg      Intake/Output Summary (Last 24 hours) at 11/29/2021 0953 Last data filed at 11/29/2021 0700 Gross per 24 hour  Intake 1416.11 ml  Output 2220 ml  Net -803.89 ml   Filed Weights   11/27/21 0500 11/28/21 0315 11/29/21 0445  Weight: 91.4 kg 88.5 kg 88.4 kg    Examination: Physical exam: General: Acute on chronically ill-appearing male, lying on the bed HEENT: [place]/AT, eyes anicteric.  moist mucus membranes Neuro: Alert, awake following commands.  Expressive aphasia, antigravity in all 4 extremities Chest: Coarse breath sounds, no wheezes or rhonchi Heart: Irregularly irregular, no murmurs or gallops Abdomen: Soft, nontender, nondistended, bowel sounds present Skin: No rash  LFTs are but is stable and serum creatinine trended up to 3.3  Resolved Hospital Problem list     Assessment & Plan:  Acute left MCA stroke s/p thrombectomy Etiology cardioembolic in the setting of afib one eliquis and cardiomyopathy with low EF.  Residual deficits include aphasia and slight R sided weakness Continue secondary stroke prophylaxis Stroke team is following Echo was negative for LV thrombus  Postprocedure small subarachnoid hemorrhage-MRI confirmed left MCA territory stroke with small amount of subarachnoid hemorrhage; improved on f/u imaging Continue neuro watch Neurology is following  Acute on  chronic systolic CHF with cardiogenic shock Shock liver Appreciate heart failure team follow-up Dobutamine was increased to 7.5 with improvement in Co-ox 73%  Continue to require low-dose Levophed with map goal 65 Urine  output has improved, made 2.1 L Currently on Lasix infusion, continue metolazone LFTs are up but stable  Chronic atrial fibrillation-Anticoagulated at baseline with Eliquis Heart rate remain well controlled Continue amiodarone Continue IV heparin for now, will switch it to Eliquis  AKI on CKD stage III a due to cardiorenal syndrome- worsened by dye load Frequent ectopy Serum creatinine remain elevated despite making good amount of urine with aggressive diuresis Monitor serum creatinine Avoid nephrotoxic agents Monitor intake and output  Hypovolemic hyponatremia Sodium is at 127 Closely monitor serum sodium  Dysphagia due to stroke Patient's family stated that patient has been having cough any chokes when he eats Swallow evaluation is requested If fails, then will place Cortrak and start tube feeds  Best Practice (right click and "Reselect all SmartList Selections" daily)   Diet/type: Swallow evaluation DVT prophylaxis: Systemic heparin GI prophylaxis: N/A Lines: yes and it is still needed as port Foley:  Yes, and it is still needed Code Status:  full code Last date of multidisciplinary goals of care discussion: 7/29 see IPAL note   Total critical care time: 34 minutes  Performed by: Cheri Fowler   Critical care time was exclusive of separately billable procedures and treating other patients.   Critical care was necessary to treat or prevent imminent or life-threatening deterioration.   Critical care was time spent personally by me on the following activities: development of treatment plan with patient and/or surrogate as well as nursing, discussions with consultants, evaluation of patient's response to treatment, examination of patient, obtaining history from patient or surrogate, ordering and performing treatments and interventions, ordering and review of laboratory studies, ordering and review of radiographic studies, pulse oximetry and re-evaluation of patient's  condition.   Cheri Fowler, MD [facility] See Amion for pager If no response to pager, please call [phone] until 7pm After 7pm, Please call E-link [phone]

## 2021-11-29 NOTE — Progress Notes (Addendum)
Patient ID: Steve Salazar, male   DOB: 1950-06-13, 71 y.o.   MRN: [id number]     Advanced Heart Failure Rounding Note  PCP-Cardiologist: [name], MD   Subjective:    7/31: Milrinone d/c 2/2 hypotension. Transitioned to DBA 5 + Lasix gtt at 15/hr. Midodrine added.  8/1: Co-ox 48%, DBA increased to 7.5. Metolazone added to augment diuresis   This morning, on DBA 7.5. NE now down to 2. Co-ox 73%. Repeat pending for confirmation.   Remains on lasix gtt at 15/hr. 2.2L in UOP yesterday, only net negative 588 cc. Wt only down 1 lb. CVP 14. SCr trending up, 3.20>>3.34. Na 129   Denies dyspnea. C/w expressive aphasia but seems to be improving some. Asking for ice chips. Has been NPO, failed swallow screen.   Objective:   Weight Range: 88.4 kg Body mass index is 26.43 kg/m.   Vital Signs:   Temp:  [97.2 F (36.2 C)-97.8 F (36.6 C)] 97.2 F (36.2 C) (08/02 0445) Pulse Rate:  [86-97] 90 (08/02 0700) Resp:  [0-30] 15 (08/02 0700) BP: (80-153)/(61-135) 102/83 (08/02 0700) SpO2:  [88 %-97 %] 95 % (08/02 0700) Arterial Line BP: (80-96)/(56-73) 88/63 (08/02 0445) Weight:  [88.4 kg] 88.4 kg (08/02 0445) Last BM Date :  (PTA)  Weight change: Filed Weights   11/27/21 0500 11/28/21 0315 11/29/21 0445  Weight: 91.4 kg 88.5 kg 88.4 kg    Intake/Output:   Intake/Output Summary (Last 24 hours) at 11/29/2021 0752 Last data filed at 11/29/2021 0700 Gross per 24 hour  Intake 1631.91 ml  Output 2220 ml  Net -588.09 ml      Physical Exam    CVP 14 General:  well appearing. No respiratory difficulty HEENT: normal  Neck: supple. JVD 14 cm. Carotids 2+ bilat; no bruits. No lymphadenopathy or thyromegaly appreciated. Cor: PMI nondisplaced. RRR. No rubs, gallops or murmurs. Lungs: CTAB  Abdomen: soft, nontender, nondistended. No hepatosplenomegaly. No bruits or masses. Good bowel sounds. Extremities: no cyanosis, clubbing, rash, 1+ b/l edema, warm   Neuro: alert & oriented x 3  Affect pleasant. + expressive aphasia. Moves all 4 extremities w/o difficulty   Telemetry   Atrial fibrillation with BiV pacing, NSVT but less frequent  (personally reviewed)   Labs    CBC Recent Labs    11/28/21 0321 11/29/21 0412  WBC 9.9 7.1  HGB 14.2 13.4  HCT 41.9 39.4  MCV 85.7 86.2  PLT 177 169   Basic Metabolic Panel Recent Labs    80/99/83 0321 11/29/21 0412  NA 127* 129*  K 4.3 3.7  CL 95* 94*  CO2 19* 23  GLUCOSE 98 103*  BUN 49* 51*  CREATININE 3.20* 3.34*  CALCIUM 8.4* 8.5*  MG 2.3 2.2  PHOS 6.3* 6.4*   Liver Function Tests Recent Labs    11/28/21 0321 11/29/21 0412  AST 78* 52*  ALT 72* 78*  ALKPHOS 58 57  BILITOT 2.1* 2.4*  PROT 5.6* 5.5*  ALBUMIN 3.0* 2.9*   No results for input(s): "LIPASE", "AMYLASE" in the last 72 hours. Cardiac Enzymes No results for input(s): "CKTOTAL", "CKMB", "CKMBINDEX", "TROPONINI" in the last 72 hours.  BNP: BNP (last 3 results) No results for input(s): "BNP" in the last 8760 hours.  ProBNP (last 3 results) No results for input(s): "PROBNP" in the last 8760 hours.   D-Dimer No results for input(s): "DDIMER" in the last 72 hours. Hemoglobin A1C No results for input(s): "HGBA1C" in the last 72 hours.  Fasting Lipid  Panel No results for input(s): "CHOL", "HDL", "LDLCALC", "TRIG", "CHOLHDL", "LDLDIRECT" in the last 72 hours.  Thyroid Function Tests No results for input(s): "TSH", "T4TOTAL", "T3FREE", "THYROIDAB" in the last 72 hours.  Invalid input(s): "FREET3"  Other results:   Imaging    No results found.   Medications:     Scheduled Medications:  Chlorhexidine Gluconate Cloth  6 each Topical Daily   feeding supplement  1 Container Oral TID BM   insulin aspart  0-15 Units Subcutaneous Q4H   midodrine  5 mg Oral Q8H   ondansetron (ZOFRAN) IV  4 mg Intravenous Once   simethicone  80 mg Oral QID    Infusions:  sodium chloride     sodium chloride     sodium chloride     sodium  chloride     amiodarone 30 mg/hr (11/29/21 0700)   DOBUTamine 7.5 mcg/kg/min (11/29/21 0700)   furosemide (LASIX) 200 mg in dextrose 5 % 100 mL (2 mg/mL) infusion 15 mg/hr (11/29/21 0700)   heparin 1,600 Units/hr (11/29/21 0700)   norepinephrine (LEVOPHED) Adult infusion 2 mcg/min (11/29/21 0700)    PRN Medications: Place/Maintain arterial line **AND** sodium chloride, Place/Maintain arterial line **AND** sodium chloride, acetaminophen **OR** acetaminophen (TYLENOL) oral liquid 160 mg/5 mL **OR** acetaminophen, mouth rinse, senna-docusate    Assessment/Plan   1. CVA: Acute left MCA CVA, likely cardioembolic.  Has dilated cardiomyopathy, also chronic atrial fibrillation. Echo did not show LV thrombus.  He is now s/p thrombectomy with residual small SAH. Per family, he probably was not taking his Eliquis at home.  C/w expressive aphasia with right-sided weakness.  - He is now on heparin gtt.  Eventually, would resume Eliquis (not a drug failure as he apparently was not taking it).  2. Atrial fibrillation: Chronic, s/p AV nodal ablation.  Had successful DCCV in 6/23 but now back in AF.  - OK for heparin gtt now per neuro.  3. AKI on CKD stage 3: Baseline creatinine 1.8-2 range, suspect cardiorenal syndrome. SCr peaked up to 3.54 (patient initially was hypotensive and off his home inotrope). Restarted on milrinone but later discontinued due to soft BPs. Now on DBA 7.5 + NE 2. MAPs 70s. UOP picking up but minimal change in SCr past 24 hrs, 3.54>>3.28>>3.20>>3.34. CVP 14 - continue to support CO w/ DBA and BP w/ NE. Wean NE as tolerated - continue midodrine 10 mg tid  - increase lasix gtt to 20/hr  - follow BMP  4. Acute on chronic systolic CHF:  NICM, found as early as 2011.  Last cath in 12/21 with normal coronaries.  Echo this admission with EF 20%, severe LV dilation, severe biatrial enlargement, moderate RV enlargement with mildly decreased systolic function, mod-severe MR, moderate TR,  dilated IVC.  Has [employer] Jude CRT-D device.  He has known low output/end stage HF, was started on milrinone gtt 0.2 at [facility] in 12/22.  He so far has refused LVAD.  He had been off milrinone initially after admission, co-ox was 40% with AKI.  Milrinone restarted 7/29 at 0.375 and NE added to maintain MAP (had been on vasopressin). Milrinone later discontinued due to soft BPs. Now on DBA 7.5 + NE 2. MAPs 70s. Co-ox 73% (repeat pending). UOP picking up but minimal change in SCr past 24 hrs, 3.54>>3.28>>3.20>>3.34. CVP 14 - Continue DBA  7.5  - Wean NE as tolerated, Continue midodrine 10 mg tid  - Increase lasix gtt to 20/hr  - There is consideration in notes about barostimulator  placement, however not sure this would be very helpful with such advanced HF (inotrope-dependent).  - Patient has refused LVAD in the past, now with large left MCA CVA.  He is not currently an LVAD candidate with the acute CVA (will need to see functional recovery), and given apparent lack of compliance with apixaban would be concerned about his compliance with warfarin if an LVAD were implanted. Concerned with rising creatinine that we are heading in the wrong direction.  Would continue aggressive medical management, but given lack of viable long-term end point (do not think LVAD candidate and has refused in past, now with expressive aphasia and confusion), would avoid temporary mechanical support.  Palliative care is following.  5. Mitral regurgitation: Moderate-severe functional MR.  LVESD > 7 cm, would not be Mitraclip candidate.  6. PVCs/NSVT: less frequent overnight. Continue amiodarone gtt at 30/hr while on dual inotropes  - keep K > 4.0 and Mg > 2.0    Length of Stay: [address], PA-C  11/29/2021, 7:52 AM  Advanced Heart Failure Team Pager [phone] (M-F; 7a - 5p)  Please contact [facility] for night-coverage after hours (5p -7a ) and weekends on [email or web address]  Patient seen with PA, agree with the above note.    He is on dobutamine 7.5 + NE 2 this morning with co-ox 73%, CVP 14.  I/Os mildly negative yesterday on Lasix gtt 15 mg/hr + metolazone.  Creatinine higher at 3.34.   No complaints, expressive aphasia seems somewhat better.   General: NAD Neck: JVP 12-14 cm, no thyromegaly or thyroid nodule.  Lungs: Clear to auscultation bilaterally with normal respiratory effort. CV: Lateral PMI.  Heart regular S1/S2, no S3/S4, no murmur.  No peripheral edema.   Abdomen: Soft, nontender, no hepatosplenomegaly, no distention.  Skin: Intact without lesions or rashes.  Neurologic: Alert and oriented x 3.  Psych: Normal affect. Extremities: No clubbing or cyanosis.  HEENT: Normal.   Wean NE off as able today, continue dobutamine 7.5 and midodrine.  Excellent co-ox.  Still with some volume overload, continue Lasix at 20 mg/hr today. Will not give metolazone as currently NPO pending speech evaluation.   If he passes speech evaluation, can stop heparin gtt and start apixaban.   CRITICAL CARE Performed by: Loralie Champagne  Total critical care time: 35 minutes  Critical care time was exclusive of separately billable procedures and treating other patients.  Critical care was necessary to treat or prevent imminent or life-threatening deterioration.  Critical care was time spent personally by me on the following activities: development of treatment plan with patient and/or surrogate as well as nursing, discussions with consultants, evaluation of patient's response to treatment, examination of patient, obtaining history from patient or surrogate, ordering and performing treatments and interventions, ordering and review of laboratory studies, ordering and review of radiographic studies, pulse oximetry and re-evaluation of patient's condition.  Loralie Champagne 11/29/2021 8:37 AM

## 2021-11-29 NOTE — Progress Notes (Signed)
Physical Therapy Treatment Patient Details Name: Steve Salazar MRN: [id number] DOB: June 02, 1950 Today's Date: 11/29/2021   History of Present Illness 71 y.o. male admitted to [facility] 11/21/2021 with acute aphasia, transfered to [facility] for thrombectomy of acute left M2 occlusion complicated with small amount of subarachnoid hemorrhage. PMHx:Gout, CKD2, NICM, HF with EF 15-20%, HTN, PSVT, ICD, Afib    PT Comments    Patient progressing with ambulation distance in hallway and with stability with BP 119/72 after ambulation(though RN had increased pressor support earlier due to SBP in 80's).  Patient completed hallway ambulation and toileting prior to rest.  He still needs assistance due to R inattention and veering R in hallway as well as for safety due to medical issues.  His brother is present and supportive helping to push the chair in the hallway.  Noted not able to have 24 hour assist at d/c so will change PT recommendations to [facility] for rehab following acute stay.  PT to follow.    Recommendations for follow up therapy are one component of a multi-disciplinary discharge planning process, led by the attending physician.  Recommendations may be updated based on patient status, additional functional criteria and insurance authorization.  Follow Up Recommendations  Skilled nursing-short term rehab (<3 hours/day) Can patient physically be transported by private vehicle: Yes   Assistance Recommended at Discharge Frequent or constant Supervision/Assistance  Patient can return home with the following A lot of help with walking and/or transfers;A lot of help with bathing/dressing/bathroom;Assistance with cooking/housework;Direct supervision/assist for medications management;Assist for transportation;Direct supervision/assist for financial management;Help with stairs or ramp for entrance   Equipment Recommendations  Rolling walker (2 wheels);BSC/3in1    Recommendations for Other Services       Precautions  / Restrictions Precautions Precautions: Fall Precaution Comments: aphasia, R inattention; SBP 110-140,  2 iv poles     Mobility  Bed Mobility               General bed mobility comments: in recliner    Transfers Overall transfer level: Needs assistance Equipment used: Rolling walker (2 wheels) Transfers: Sit to/from Stand Sit to Stand: Min assist           General transfer comment: assist for lines, walker safety    Ambulation/Gait Ambulation/Gait assistance: Min assist, +2 safety/equipment (+3) Gait Distance (Feet): 350 Feet Assistive device: Rolling walker (2 wheels) Gait Pattern/deviations: Step-through pattern, Decreased stride length, Drifts right/left       General Gait Details: tendency to veer to R with RW assist for walker management throughout; chair follow for safety if BP drops and another assist for 2 IV poles   Stairs             Wheelchair Mobility    Modified Rankin (Stroke Patients Only) Modified Rankin (Stroke Patients Only) Pre-Morbid Rankin Score: No symptoms Modified Rankin: Moderately severe disability     Balance Overall balance assessment: Needs assistance Sitting-balance support: No upper extremity supported Sitting balance-Leahy Scale: Fair Sitting balance - Comments: supervision static sitting   Standing balance support: Bilateral upper extremity supported, No upper extremity supported, During functional activity Standing balance-Leahy Scale: Fair Standing balance comment: after toileting stood without holding walker, cues to hold for balance while PT assisted for hygiene                            Cognition Arousal/Alertness: Awake/alert Behavior During Therapy: WFL for tasks assessed/performed Overall Cognitive Status: Impaired/Different from baseline  Area of Impairment: Safety/judgement, Attention, Problem solving                   Current Attention Level: Sustained Memory: Decreased  short-term memory   Safety/Judgement: Decreased awareness of safety, Decreased awareness of deficits   Problem Solving: Slow processing, Requires verbal cues, Requires tactile cues          Exercises      General Comments General comments (skin integrity, edema, etc.): brother in the room initially, supported him and pushed chair in hallway.  BP 119/72 after ambulation (earlier unable to walk as BP 80's systolic and RN turned up Levophed.)      Pertinent Vitals/Pain Pain Assessment Pain Assessment: No/denies pain    Home Living                          Prior Function            PT Goals (current goals can now be found in the care plan section) Progress towards PT goals: Progressing toward goals    Frequency    Min 3X/week      PT Plan Discharge plan needs to be updated    Co-evaluation              AM-PAC PT "6 Clicks" Mobility   Outcome Measure  Help needed turning from your back to your side while in a flat bed without using bedrails?: A Little Help needed moving from lying on your back to sitting on the side of a flat bed without using bedrails?: A Little Help needed moving to and from a bed to a chair (including a wheelchair)?: A Little Help needed standing up from a chair using your arms (e.g., wheelchair or bedside chair)?: A Little Help needed to walk in hospital room?: A Lot Help needed climbing 3-5 steps with a railing? : Total 6 Click Score: 15    End of Session Equipment Utilized During Treatment: Gait belt Activity Tolerance: Patient tolerated treatment well Patient left: in chair;with call bell/phone within reach;with chair alarm set   PT Visit Diagnosis: Other symptoms and signs involving the nervous system (R29.898);Other abnormalities of gait and mobility (R26.89);Difficulty in walking, not elsewhere classified (R26.2)     Time: 1610-9604 PT Time Calculation (min) (ACUTE ONLY): 29 min  Charges:  $Gait Training: 8-22  mins $Therapeutic Activity: 8-22 mins                     Sheran Lawless, PT Acute Rehabilitation Services Office:[phone] 11/29/2021    Elray Mcgregor 11/29/2021, 1:39 PM

## 2021-11-29 NOTE — Plan of Care (Signed)
Patient remains in CVICU overnight. Patient remains on infusions of: heparin, dobutamine, levophed, amiodarone, and lasix. CVC, Art, and Foley remain in place.   Problem: Education: Goal: Understanding of CV disease, CV risk reduction, and recovery process will improve Outcome: Not Progressing Goal: Individualized Educational Video(s) Outcome: Not Progressing   Problem: Activity: Goal: Ability to return to baseline activity level will improve Outcome: Not Progressing   Problem: Cardiovascular: Goal: Ability to achieve and maintain adequate cardiovascular perfusion will improve Outcome: Not Progressing Goal: Vascular access site(s) Level 0-1 will be maintained Outcome: Not Progressing   Problem: Health Behavior/Discharge Planning: Goal: Ability to safely manage health-related needs after discharge will improve Outcome: Not Progressing   Problem: Safety: Goal: Non-violent Restraint(s) Outcome: Not Progressing   Problem: Education: Goal: Ability to describe self-care measures that may prevent or decrease complications (Diabetes Survival Skills Education) will improve Outcome: Not Progressing Goal: Individualized Educational Video(s) Outcome: Not Progressing   Problem: Coping: Goal: Ability to adjust to condition or change in health will improve Outcome: Not Progressing   Problem: Fluid Volume: Goal: Ability to maintain a balanced intake and output will improve Outcome: Not Progressing   Problem: Health Behavior/Discharge Planning: Goal: Ability to identify and utilize available resources and services will improve Outcome: Not Progressing Goal: Ability to manage health-related needs will improve Outcome: Not Progressing   Problem: Metabolic: Goal: Ability to maintain appropriate glucose levels will improve Outcome: Not Progressing   Problem: Nutritional: Goal: Maintenance of adequate nutrition will improve Outcome: Not Progressing Goal: Progress toward achieving an  optimal weight will improve Outcome: Not Progressing   Problem: Skin Integrity: Goal: Risk for impaired skin integrity will decrease Outcome: Not Progressing   Problem: Tissue Perfusion: Goal: Adequacy of tissue perfusion will improve Outcome: Not Progressing   Problem: Education: Goal: Knowledge of General Education information will improve Description: Including pain rating scale, medication(s)/side effects and non-pharmacologic comfort measures Outcome: Not Progressing   Problem: Health Behavior/Discharge Planning: Goal: Ability to manage health-related needs will improve Outcome: Not Progressing   Problem: Clinical Measurements: Goal: Ability to maintain clinical measurements within normal limits will improve Outcome: Not Progressing Goal: Will remain free from infection Outcome: Not Progressing Goal: Diagnostic test results will improve Outcome: Not Progressing Goal: Respiratory complications will improve Outcome: Not Progressing Goal: Cardiovascular complication will be avoided Outcome: Not Progressing   Problem: Activity: Goal: Risk for activity intolerance will decrease Outcome: Not Progressing   Problem: Nutrition: Goal: Adequate nutrition will be maintained Outcome: Not Progressing   Problem: Coping: Goal: Level of anxiety will decrease Outcome: Not Progressing   Problem: Elimination: Goal: Will not experience complications related to bowel motility Outcome: Not Progressing Goal: Will not experience complications related to urinary retention Outcome: Not Progressing   Problem: Pain Managment: Goal: General experience of comfort will improve Outcome: Not Progressing   Problem: Safety: Goal: Ability to remain free from injury will improve Outcome: Not Progressing   Problem: Skin Integrity: Goal: Risk for impaired skin integrity will decrease Outcome: Not Progressing   Problem: Education: Goal: Knowledge of disease or condition will  improve Outcome: Not Progressing Goal: Knowledge of secondary prevention will improve (SELECT ALL) Outcome: Not Progressing Goal: Individualized Educational Video(s) Outcome: Not Progressing   Problem: Health Behavior/Discharge Planning: Goal: Ability to manage health-related needs will improve Outcome: Not Progressing   Problem: Self-Care: Goal: Ability to participate in self-care as condition permits will improve Outcome: Not Progressing Goal: Ability to communicate needs accurately will improve Outcome: Not Progressing   Problem: Nutrition:  Goal: Risk of aspiration will decrease Outcome: Not Progressing Goal: Dietary intake will improve Outcome: Not Progressing   Problem: Ischemic Stroke/TIA Tissue Perfusion: Goal: Complications of ischemic stroke/TIA will be minimized Outcome: Not Progressing   Problem: Spontaneous Subarachnoid Hemorrhage Tissue Perfusion: Goal: Complications of Spontaneous Subarachnoid Hemorrhage will be minimized Outcome: Not Progressing

## 2021-11-29 NOTE — Progress Notes (Signed)
Occupational Therapy Treatment Patient Details Name: Steve Salazar MRN: [id number] DOB: [birth year] Today's Date: 11/29/2021   History of present illness 71 y.o. male admitted to [facility] 10/29/2021 with acute aphasia, transfered to [facility] for thrombectomy of acute left M2 occlusion complicated with small amount of subarachnoid hemorrhage. PMHx:Gout, CKD2, NICM, HF with EF 15-20%, HTN, PSVT, ICD, Afib   OT comments  Patient with good progress toward all patient focused goals.  Able to advance OOB to sink for stand grooming and perform mobility in the room with Min A and cues for safety and sequencing.  Continues to struggle with command following due to aphasia, and loss of balance backwards noted times two with no real righting reaction.  Patient left up in the recliner with all needs in reach.  Continue efforts in the acute setting with AIR continuing to be recommended.  Given abilities, patient can tolerate 3+ hours of rehab.     Recommendations for follow up therapy are one component of a multi-disciplinary discharge planning process, led by the attending physician.  Recommendations may be updated based on patient status, additional functional criteria and insurance authorization.    Follow Up Recommendations  Acute inpatient rehab (3hours/day)    Assistance Recommended at Discharge Frequent or constant Supervision/Assistance  Patient can return home with the following  A lot of help with walking and/or transfers;A lot of help with bathing/dressing/bathroom;Assistance with cooking/housework;Assistance with feeding;Help with stairs or ramp for entrance;Assist for transportation;Direct supervision/assist for medications management;Direct supervision/assist for financial management   Equipment Recommendations  None recommended by OT    Recommendations for Other Services Rehab consult    Precautions / Restrictions Precautions Precautions: Fall;Other (comment) Restrictions Weight Bearing  Restrictions: No       Mobility Bed Mobility Overal bed mobility: Needs Assistance Bed Mobility: Supine to Sit     Supine to sit: Supervision          Transfers Overall transfer level: Needs assistance Equipment used: Rolling walker (2 wheels) Transfers: Sit to/from Stand, Bed to chair/wheelchair/BSC Sit to Stand: Min assist     Step pivot transfers: Min assist           Balance Overall balance assessment: Needs assistance Sitting-balance support: No upper extremity supported, Feet supported Sitting balance-Steve Salazar Scale: Fair     Standing balance support: Bilateral upper extremity supported Standing balance-Steve Salazar Scale: Fair                             ADL either performed or assessed with clinical judgement   ADL   Eating/Feeding: NPO   Grooming: Minimal assistance;Standing           Upper Body Dressing : Minimal assistance;Sitting   Lower Body Dressing: Moderate assistance;Sit to/from stand   Toilet Transfer: Minimal assistance;Ambulation;Cueing for safety                  Extremity/Trunk Assessment Upper Extremity Assessment Upper Extremity Assessment: RUE deficits/detail;LUE deficits/detail RUE Coordination: decreased fine motor;decreased gross motor LUE Coordination: decreased fine motor;decreased gross motor   Lower Extremity Assessment Lower Extremity Assessment: Defer to PT evaluation   Cervical / Trunk Assessment Cervical / Trunk Assessment: Normal    Vision   Additional Comments: verbal cues to scan R to find towel dispenser and locate ADL items to the R   Perception     Praxis Praxis Praxis: Impaired Praxis Impairment Details: Initiation    Cognition Arousal/Alertness: Awake/alert Behavior During Therapy: Flat  affect Overall Cognitive Status: Impaired/Different from baseline                     Current Attention Level: Sustained   Following Commands: Follows one step commands inconsistently,  Follows one step commands with increased time     Problem Solving: Slow processing, Requires verbal cues, Requires tactile cues           Prior Functioning/Environment              Frequency  Min 2X/week        Progress Toward Goals  OT Goals(current goals can now be found in the care plan section)  Progress towards OT goals: Progressing toward goals  Acute Rehab OT Goals OT Goal Formulation: With family Time For Goal Achievement: 12/08/21 Potential to Achieve Goals: Good  Plan Discharge plan remains appropriate    Co-evaluation    PT/OT/SLP Co-Evaluation/Treatment: Yes            AM-PAC OT "6 Clicks" Daily Activity     Outcome Measure   Help from another person eating meals?: Total Help from another person taking care of personal grooming?: A Little Help from another person toileting, which includes using toliet, bedpan, or urinal?: A Lot Help from another person bathing (including washing, rinsing, drying)?: A Lot Help from another person to put on and taking off regular upper body clothing?: A Little Help from another person to put on and taking off regular lower body clothing?: A Lot 6 Click Score: 13    End of Session Equipment Utilized During Treatment: Rolling walker (2 wheels)  OT Visit Diagnosis: Unsteadiness on feet (R26.81);Other abnormalities of gait and mobility (R26.89);Low vision, both eyes (H54.2);Other symptoms and signs involving cognitive function;Cognitive communication deficit (R41.841) Symptoms and signs involving cognitive functions: Other Nontraumatic ICH   Activity Tolerance Patient tolerated treatment well   Patient Left in chair;with call bell/phone within reach;with family/visitor present   Nurse Communication Mobility status        Time: 1610-9604 OT Time Calculation (min): 19 min  Charges: OT General Charges $OT Visit: 1 Visit OT Treatments $Self Care/Home Management : 8-22 mins  11/29/2021  RP, OTR/L  Acute  Rehabilitation Services  Office:  [phone]   Suzanna Obey 11/29/2021, 8:56 AM

## 2021-11-29 NOTE — NC FL2 (Signed)
[place] MEDICAID FL2 LEVEL OF CARE SCREENING TOOL     IDENTIFICATION  Patient Name: Steve Salazar Birthdate: 1951/01/03 Sex: male Admission Date (Current Location): 11/24/2021  [facility] and [place] Number:  Producer, television/film/video and Address:  [facility], [address] 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   Atrial flutter (HCC)    GERD (gastroesophageal reflux disease) 04/15/2021   Syncope 04/15/2021   VT (ventricular tachycardia) (HCC) 04/15/2021   Biventricular ICD (implantable cardioverter-defibrillator) in place 08/23/2020   CHF (congestive heart failure) (HCC) 03/02/2020   Atrial tachycardia (HCC) 09/02/2017   CKD (chronic kidney disease) 07/10/2017   Elevated PSA 07/10/2017   Primary osteoarthritis of left shoulder 07/12/2016   BPH (benign prostatic hyperplasia) 01/13/2016   Family history of diabetes mellitus 09/29/2015   Right inguinal hernia 09/29/2015   Erectile dysfunction 06/01/2015   Chronic systolic CHF (congestive heart failure) (HCC) 08/12/2014   Essential hypertension 01/26/2013   Bradycardia 12/07/2011   Type 2 MI (myocardial infarction) (HCC) 07/22/2009   SVT/ PSVT/ PAT 07/22/2009   DYSPNEA 07/22/2009    Orientation RESPIRATION BLADDER Height & Weight     Self, Time, Situation, Place  Normal Continent (Urethral Catheter Latex 16 Fr.) Weight: 194 lb 14.2 oz (88.4 kg) Height:  6' (182.9 cm)  BEHAVIORAL SYMPTOMS/MOOD NEUROLOGICAL BOWEL NUTRITION STATUS      Incontinent Diet  (Please see discharge summary)  AMBULATORY STATUS COMMUNICATION OF NEEDS Skin   Extensive Assist Verbally Other (Comment) (Appropriate for ethnicity,dry,intact)                       Personal Care Assistance Level of Assistance  Bathing, Feeding, Dressing Bathing Assistance: Maximum assistance Feeding assistance: Limited assistance (Needs set up,holding food) Dressing Assistance: Maximum assistance     Functional Limitations Info  Sight, Hearing, Speech   Hearing Info: Adequate (WDL) Speech Info: Impaired (difficulty speaking)    SPECIAL CARE FACTORS FREQUENCY  PT (By licensed PT), OT (By licensed OT)     PT Frequency: 5x min weekly OT Frequency: 5x min weekly            Contractures Contractures Info: Not present    Additional Factors Info  Code Status, Allergies, Insulin Sliding Scale Code Status Info: FULL Allergies Info: No Known Allergies   Insulin Sliding Scale Info: insulin aspart (novoLOG) injection 0-15 Units every 4 hours       Current Medications (11/29/2021):  This is the current hospital active medication list Current Facility-Administered Medications  Medication Dose Route Frequency Provider Last Rate Last Admin   0.9 %  sodium chloride infusion  250 mL Intravenous Continuous Caryl Pina, MD       0.9 %  sodium chloride infusion  250 mL Intravenous Continuous Caryl Pina, MD       0.9 %  sodium chloride infusion   Intra-arterial PRN Laurey Morale, MD       0.9 %  sodium chloride infusion   Intra-arterial PRN  Laurey Morale, MD       acetaminophen (TYLENOL) tablet 650 mg  650 mg Oral Q4H PRN Caryl Pina, MD       Or   acetaminophen (TYLENOL) 160 MG/5ML solution 650 mg  650 mg Per Tube Q4H PRN Caryl Pina, MD       Or   acetaminophen (TYLENOL) suppository 650 mg  650 mg Rectal Q4H PRN Caryl Pina, MD       amiodarone (NEXTERONE PREMIX) 360-4.14 MG/200ML-% (1.8 mg/mL) IV infusion  30 mg/hr Intravenous Continuous Laurey Morale, MD  16.67 mL/hr at 11/29/21 1100 30 mg/hr at 11/29/21 1100   Chlorhexidine Gluconate Cloth 2 % PADS 6 each  6 each Topical Daily Caryl Pina, MD   6 each at 11/28/21 1000   DOBUTamine (DOBUTREX) infusion 4000 mcg/mL  7.5 mcg/kg/min Intravenous Continuous Laurey Morale, MD 10.28 mL/hr at 11/29/21 1100 7.5 mcg/kg/min at 11/29/21 1100   feeding supplement (BOOST / RESOURCE BREEZE) liquid 1 Container  1 Container Oral TID BM Cheri Fowler, MD       furosemide (LASIX) 200 mg in dextrose 5 % 100 mL (2 mg/mL) infusion  20 mg/hr Intravenous Continuous Cheri Fowler, MD 10 mL/hr at 11/29/21 1100 20 mg/hr at 11/29/21 1100   heparin ADULT infusion 100 units/mL (25000 units/246mL)  1,600 Units/hr Intravenous Continuous Laurey Morale, MD 16 mL/hr at 11/29/21 1100 1,600 Units/hr at 11/29/21 1100   insulin aspart (novoLOG) injection 0-15 Units  0-15 Units Subcutaneous Q4H Caryl Pina, MD   5 Units at 11/27/21 2352   midodrine (PROAMATINE) tablet 5 mg  5 mg Oral Q8H Laurey Morale, MD   5 mg at 11/28/21 1520   norepinephrine (LEVOPHED) 16 mg in premix infusion  0-40 mcg/min Intravenous Titrated Cheri Fowler, MD 2.81 mL/hr at 11/29/21 1100 3 mcg/min at 11/29/21 1100   ondansetron (ZOFRAN) injection 4 mg  4 mg Intravenous Once Henry Russel, MD       Oral care mouth rinse  15 mL Mouth Rinse PRN Caryl Pina, MD       senna-docusate (Senokot-S) tablet 1 tablet  1 tablet Oral QHS PRN Caryl Pina, MD       simethicone (MYLICON) chewable tablet 80 mg  80 mg Oral QID Cheri Fowler, MD   80 mg at 11/28/21 1520     Discharge Medications: Please see discharge summary for a list of discharge medications.  Relevant Imaging Results:  Relevant Lab Results:   Additional Information [id number]  Delilah Shan, LCSWA

## 2021-11-30 DIAGNOSIS — N179 Acute kidney failure, unspecified: Secondary | ICD-10-CM | POA: Diagnosis not present

## 2021-11-30 DIAGNOSIS — R570 Cardiogenic shock: Secondary | ICD-10-CM | POA: Diagnosis not present

## 2021-11-30 DIAGNOSIS — E871 Hypo-osmolality and hyponatremia: Secondary | ICD-10-CM

## 2021-11-30 DIAGNOSIS — I63412 Cerebral infarction due to embolism of left middle cerebral artery: Secondary | ICD-10-CM | POA: Diagnosis not present

## 2021-11-30 LAB — CBC
HCT: 39.6 % (ref 39.0–52.0)
Hemoglobin: 13.6 g/dL (ref 13.0–17.0)
MCH: 29.6 pg (ref 26.0–34.0)
MCHC: 34.3 g/dL (ref 30.0–36.0)
MCV: 86.3 fL (ref 80.0–100.0)
Platelets: 179 10*3/uL (ref 150–400)
RBC: 4.59 MIL/uL (ref 4.22–5.81)
RDW: 13.9 % (ref 11.5–15.5)
WBC: 8.2 10*3/uL (ref 4.0–10.5)
nRBC: 0.2 % (ref 0.0–0.2)

## 2021-11-30 LAB — GLUCOSE, CAPILLARY
Glucose-Capillary: 102 mg/dL — ABNORMAL HIGH (ref 70–99)
Glucose-Capillary: 103 mg/dL — ABNORMAL HIGH (ref 70–99)
Glucose-Capillary: 115 mg/dL — ABNORMAL HIGH (ref 70–99)
Glucose-Capillary: 131 mg/dL — ABNORMAL HIGH (ref 70–99)
Glucose-Capillary: 134 mg/dL — ABNORMAL HIGH (ref 70–99)
Glucose-Capillary: 95 mg/dL (ref 70–99)

## 2021-11-30 LAB — PHOSPHORUS: Phosphorus: 6.5 mg/dL — ABNORMAL HIGH (ref 2.5–4.6)

## 2021-11-30 LAB — COOXEMETRY PANEL
Carboxyhemoglobin: 1 % (ref 0.5–1.5)
Carboxyhemoglobin: 1.5 % (ref 0.5–1.5)
Carboxyhemoglobin: 1.4 % (ref 0.5–1.5)
Methemoglobin: <0.7 % (ref 0.0–1.5)
Methemoglobin: 0.7 % (ref 0.0–1.5)
Methemoglobin: 0.7 % (ref 0.0–1.5)
O2 Saturation: 37.9 %
O2 Saturation: 55.2 %
O2 Saturation: 59.2 %
Total hemoglobin: 13.9 g/dL (ref 12.0–16.0)
Total hemoglobin: 13.8 g/dL (ref 12.0–16.0)
Total hemoglobin: 13.6 g/dL (ref 12.0–16.0)

## 2021-11-30 LAB — BASIC METABOLIC PANEL
Anion gap: 11 (ref 5–15)
Anion gap: 15 (ref 5–15)
BUN: 55 mg/dL — ABNORMAL HIGH (ref 8–23)
BUN: 66 mg/dL — ABNORMAL HIGH (ref 8–23)
CO2: 24 mmol/L (ref 22–32)
CO2: 23 mmol/L (ref 22–32)
Calcium: 8.5 mg/dL — ABNORMAL LOW (ref 8.9–10.3)
Calcium: 8.8 mg/dL — ABNORMAL LOW (ref 8.9–10.3)
Chloride: 92 mmol/L — ABNORMAL LOW (ref 98–111)
Chloride: 91 mmol/L — ABNORMAL LOW (ref 98–111)
Creatinine, Ser: 3.62 mg/dL — ABNORMAL HIGH (ref 0.61–1.24)
Creatinine, Ser: 4.12 mg/dL — ABNORMAL HIGH (ref 0.61–1.24)
GFR, Estimated: 17 mL/min — ABNORMAL LOW (ref >60)
GFR, Estimated: 15 mL/min — ABNORMAL LOW (ref >60)
Glucose, Bld: 104 mg/dL — ABNORMAL HIGH (ref 70–99)
Glucose, Bld: 95 mg/dL (ref 70–99)
Potassium: 3.5 mmol/L (ref 3.5–5.1)
Potassium: 3.6 mmol/L (ref 3.5–5.1)
Sodium: 127 mmol/L — ABNORMAL LOW (ref 135–145)
Sodium: 129 mmol/L — ABNORMAL LOW (ref 135–145)

## 2021-11-30 LAB — HEPATIC FUNCTION PANEL
ALT: 73 U/L — ABNORMAL HIGH (ref 0–44)
AST: 44 U/L — ABNORMAL HIGH (ref 15–41)
Albumin: 2.9 g/dL — ABNORMAL LOW (ref 3.5–5.0)
Alkaline Phosphatase: 58 U/L (ref 38–126)
Bilirubin, Direct: 0.7 mg/dL — ABNORMAL HIGH (ref 0.0–0.2)
Indirect Bilirubin: 1.8 mg/dL — ABNORMAL HIGH (ref 0.3–0.9)
Total Bilirubin: 2.5 mg/dL — ABNORMAL HIGH (ref 0.3–1.2)
Total Protein: 5.7 g/dL — ABNORMAL LOW (ref 6.5–8.1)

## 2021-11-30 LAB — HEPARIN LEVEL (UNFRACTIONATED): Heparin Unfractionated: 0.53 IU/mL (ref 0.30–0.70)

## 2021-11-30 LAB — MAGNESIUM: Magnesium: 2.4 mg/dL (ref 1.7–2.4)

## 2021-11-30 MED ORDER — AMIODARONE HCL 200 MG PO TABS
200.0000 mg | ORAL_TABLET | Freq: Two times a day (BID) | ORAL | Status: DC
  Administered 2021-11-30 – 2021-12-09 (×19): 200 mg via ORAL
  Filled 2021-11-30 (×20): qty 1

## 2021-11-30 MED ORDER — APIXABAN 5 MG PO TABS
5.0000 mg | ORAL_TABLET | Freq: Two times a day (BID) | ORAL | Status: DC
  Administered 2021-11-30 – 2021-12-13 (×25): 5 mg via ORAL
  Filled 2021-11-30 (×27): qty 1

## 2021-11-30 MED ORDER — POTASSIUM CHLORIDE 20 MEQ PO PACK
20.0000 meq | PACK | Freq: Once | ORAL | Status: AC
Start: 2021-11-30 — End: 2021-11-30
  Administered 2021-11-30: 20 meq via ORAL
  Filled 2021-11-30: qty 1

## 2021-11-30 MED ORDER — TORSEMIDE 20 MG PO TABS
80.0000 mg | ORAL_TABLET | Freq: Two times a day (BID) | ORAL | Status: DC
  Administered 2021-11-30: 80 mg via ORAL
  Filled 2021-11-30: qty 4

## 2021-11-30 NOTE — Progress Notes (Addendum)
Patient ID: Steve Salazar, male   DOB: [birth year], 71 y.o.   MRN: [id number]     Advanced Heart Failure Rounding Note  PCP-Cardiologist: Ida Rogue, MD   Subjective:    7/31: Milrinone d/c 2/2 hypotension. Transitioned to DBA 5 + Lasix gtt at 15/hr. Midodrine added.  8/1: Co-ox 48%, DBA increased to 7.5. Metolazone added to augment diuresis  8/2: Lasix gtt increased to 20/hr + 2.5 metolazone   On DBA 7.5 + NE 7. Co-ox 38%. Sluggish UOP despite lasix gtt, only 1L out yesterday. CVP 19. SCr trending up, 3.20>>3.34>>3.62.   Na 127   Sitting up in chair. Says he feels well. Denies resting dyspnea. C/w expressive aphasia but seems to be improving some. Girlfriend at bedside.     Objective:   Weight Range: 86.8 kg Body mass index is 25.95 kg/m.   Vital Signs:   Temp:  [97.4 F (36.3 C)-98.2 F (36.8 C)] 97.5 F (36.4 C) (08/03 0426) Pulse Rate:  [86-168] 87 (08/03 0500) Resp:  [0-34] 18 (08/03 0500) BP: (73-135)/(53-101) 135/98 (08/03 0500) SpO2:  [85 %-98 %] 95 % (08/03 0500) Arterial Line BP: (67-109)/(58-86) 76/59 (08/03 0357) Weight:  [86.8 kg] 86.8 kg (08/03 0426) Last BM Date : 11/30/21  Weight change: Filed Weights   11/28/21 0315 11/29/21 0445 11/30/21 0426  Weight: 88.5 kg 88.4 kg 86.8 kg    Intake/Output:   Intake/Output Summary (Last 24 hours) at 11/30/2021 0728 Last data filed at 11/30/2021 0600 Gross per 24 hour  Intake 1554.13 ml  Output 1030 ml  Net 524.13 ml      Physical Exam    CVP 19  General:  fatigued appearing. No respiratory difficulty HEENT: normal  Neck: supple. JVD to jaw. Carotids 2+ bilat; no bruits. No lymphadenopathy or thyromegaly appreciated. Cor: PMI nondisplaced. RRR. No rubs, gallops or murmurs. Lungs: decreased BS at the bases  Abdomen: soft, nontender, nondistended. No hepatosplenomegaly. No bruits or masses. Good bowel sounds. Extremities: no cyanosis, clubbing, rash, 1+ b/l edema, cool distal extremities  Neuro: alert  & oriented x 3 Affect pleasant. + expressive aphasia. Moves all 4 extremities w/o difficulty   Telemetry   Atrial fibrillation with BiV pacing, NSVT but less frequent  (personally reviewed)   Labs    CBC Recent Labs    11/29/21 0412 11/30/21 0355  WBC 7.1 8.2  HGB 13.4 13.6  HCT 39.4 39.6  MCV 86.2 86.3  PLT 169 0000000   Basic Metabolic Panel Recent Labs    11/29/21 0412 11/30/21 0355  NA 129* 127*  K 3.7 3.5  CL 94* 92*  CO2 23 24  GLUCOSE 103* 104*  BUN 51* 55*  CREATININE 3.34* 3.62*  CALCIUM 8.5* 8.5*  MG 2.2 2.4  PHOS 6.4* 6.5*   Liver Function Tests Recent Labs    11/29/21 0412 11/30/21 0355  AST 52* 44*  ALT 78* 73*  ALKPHOS 57 58  BILITOT 2.4* 2.5*  PROT 5.5* 5.7*  ALBUMIN 2.9* 2.9*   No results for input(s): "LIPASE", "AMYLASE" in the last 72 hours. Cardiac Enzymes No results for input(s): "CKTOTAL", "CKMB", "CKMBINDEX", "TROPONINI" in the last 72 hours.  BNP: BNP (last 3 results) No results for input(s): "BNP" in the last 8760 hours.  ProBNP (last 3 results) No results for input(s): "PROBNP" in the last 8760 hours.   D-Dimer No results for input(s): "DDIMER" in the last 72 hours. Hemoglobin A1C No results for input(s): "HGBA1C" in the last 72 hours.  Fasting Lipid  Panel No results for input(s): "CHOL", "HDL", "LDLCALC", "TRIG", "CHOLHDL", "LDLDIRECT" in the last 72 hours.  Thyroid Function Tests No results for input(s): "TSH", "T4TOTAL", "T3FREE", "THYROIDAB" in the last 72 hours.  Invalid input(s): "FREET3"  Other results:   Imaging    No results found.   Medications:     Scheduled Medications:  Chlorhexidine Gluconate Cloth  6 each Topical Daily   feeding supplement  1 Container Oral TID BM   insulin aspart  0-15 Units Subcutaneous Q4H   midodrine  10 mg Oral Q8H   ondansetron (ZOFRAN) IV  4 mg Intravenous Once   oxybutynin  5 mg Oral TID   simethicone  80 mg Oral QID    Infusions:  sodium chloride     sodium  chloride     sodium chloride     sodium chloride     amiodarone 30 mg/hr (11/30/21 0500)   DOBUTamine 7.5 mcg/kg/min (11/30/21 0500)   furosemide (LASIX) 200 mg in dextrose 5 % 100 mL (2 mg/mL) infusion 20 mg/hr (11/30/21 0652)   heparin 1,600 Units/hr (11/30/21 0500)   norepinephrine (LEVOPHED) Adult infusion 7 mcg/min (11/30/21 0500)    PRN Medications: Place/Maintain arterial line **AND** sodium chloride, Place/Maintain arterial line **AND** sodium chloride, acetaminophen **OR** acetaminophen (TYLENOL) oral liquid 160 mg/5 mL **OR** acetaminophen, mouth rinse, senna-docusate    Assessment/Plan   1. CVA: Acute left MCA CVA, likely cardioembolic.  Has dilated cardiomyopathy, also chronic atrial fibrillation. Echo did not show LV thrombus.  He is now s/p thrombectomy with residual small SAH. Per family, he probably was not taking his Eliquis at home.  C/w expressive aphasia with right-sided weakness.  - He is now on heparin gtt.  Passed swallow eval. Switch to Eliquis today (not a drug failure as he apparently was not taking it) 2. Atrial fibrillation: Chronic, s/p AV nodal ablation.  Had successful DCCV in 6/23 but now back in AF.  - Eliquis 5 mg bid   3. AKI on CKD stage 3: Baseline creatinine 1.8-2 range, suspect cardiorenal syndrome. SCr peaked up to 3.54 (patient initially was hypotensive and off his home inotrope). Restarted on milrinone but later discontinued due to soft BPs. Now on DBA 7.5 + NE 7. UOP sluggish despite support and high dose lasix gtt. SCr trending up 3.20>>3.34>>3.62. - continue current support for now, but suspect we are nearing palliative situation w/ end-stage HF. Not candidate for HD  4. Acute on chronic systolic CHF:  NICM, found as early as 2011.  Last cath in 12/21 with normal coronaries.  Echo this admission with EF 20%, severe LV dilation, severe biatrial enlargement, moderate RV enlargement with mildly decreased systolic function, mod-severe MR, moderate TR,  dilated IVC.  Has [employer] Jude CRT-D device.  He has known low output/end stage HF, was started on milrinone gtt 0.2 at [facility] in 12/22.  He so far has refused LVAD.  He had been off milrinone initially after admission, co-ox was 40% with AKI.  Milrinone restarted 7/29 at 0.375 and NE added to maintain MAP (had been on vasopressin). Milrinone later discontinued due to soft BPs. Now on DBA 7.5 + NE 7. Co-ox low 38%. UOP sluggish despite support and high dose lasix gtt. CVP 19. SCr trending up 3.20>>3.34>>3.62. Appears to be end-stage HF.  - There is consideration in notes about barostimulator placement, however not sure this would be very helpful with such advanced HF (inotrope-dependent).  - Patient has refused LVAD in the past, now with large left  MCA CVA.  He is not currently an LVAD candidate with the acute CVA (will need to see functional recovery), and given apparent lack of compliance with apixaban would be concerned about his compliance with warfarin if an LVAD were implanted. Concerned with rising creatinine that we are heading in the wrong direction.  Would continue aggressive medical management, but given lack of viable long-term end point (do not think LVAD candidate and has refused in past, now with expressive aphasia and confusion), would avoid temporary mechanical support.  Palliative care is following. Now that he is failing dual inotropes, we should discuss move towards comfort care/hospice.  5. Mitral regurgitation: Moderate-severe functional MR.  LVESD > 7 cm, would not be Mitraclip candidate.  6. PVCs/NSVT: less frequent overnight. Continue amiodarone gtt at 30/hr while on dual inotropes  - keep K > 4.0 and Mg > 2.0    Length of Stay: [address] PA-C  11/30/2021, 7:28 AM  Advanced Heart Failure Team Pager [phone] (M-F; 7a - 5p)  Please contact [facility] for night-coverage after hours (5p -7a ) and weekends on [email or web address]  Patient seen with PA, agree with the above  note.  UOP 1050 yesterday, creatinine up to 3.62.  He is on dobutamine 7.5, now off NE.  Repeat co-ox was 55%. He remains on Lasix gtt 20 mg/hr.  I checked his CVP myself and get 13-14.   Still with expressive aphasia but improved.  He has been walking in the halls, denies dyspnea.   General: NAD Neck: JVP 8-9 cm, no thyromegaly or thyroid nodule.  Lungs: Clear to auscultation bilaterally with normal respiratory effort. CV: Lateral PMI.  Heart regular S1/S2, no S3/S4, no murmur.  Trace ankle edema.   Abdomen: Soft, nontender, no hepatosplenomegaly, no distention.  Skin: Intact without lesions or rashes.  Neurologic: Alert and oriented x 3.  Psych: Normal affect. Extremities: No clubbing or cyanosis.  HEENT: Normal.   Co-ox adequate though marginal on dobutamine 7.5 (55%), continue.  Can stay off NE for now.  He is also on midodrine 10 tid.   CVP still 13-14 but creatinine rising (3.62).  He is walking in the hall without dyspnea.  I will stop Lasix gtt for now and start torsemide 80 mg bid to see if this will allow renal function to equilibrate.   Think we can transition amiodarone (for PVCs) to po.   Long-term prognosis poor, not candidate for LVAD at this time.   CRITICAL CARE Performed by: Marca Ancona  Total critical care time: 40 minutes  Critical care time was exclusive of separately billable procedures and treating other patients.  Critical care was necessary to treat or prevent imminent or life-threatening deterioration.  Critical care was time spent personally by me on the following activities: development of treatment plan with patient and/or surrogate as well as nursing, discussions with consultants, evaluation of patient's response to treatment, examination of patient, obtaining history from patient or surrogate, ordering and performing treatments and interventions, ordering and review of laboratory studies, ordering and review of radiographic studies, pulse oximetry  and re-evaluation of patient's condition.  Marca Ancona. 11/30/2021 9:30 AM

## 2021-11-30 NOTE — Progress Notes (Signed)
Speech Language Pathology Treatment: Dysphagia  Patient Details Name: Steve Salazar MRN: [id number] DOB: 07/08/1950 Today's Date: 11/30/2021 Time: 0950-1005 SLP Time Calculation (min) (ACUTE ONLY): 15 min  Assessment / Plan / Recommendation Clinical Impression  Patient seen by SLP for skilled treatment session focused on dysphagia goals. When SLP entered room, patient in recliner and RN about to give him last of his medications. He took pill with thin liquids and although he exhibited what appeared to be some oral transit delays and swallow initiation delays, no overt s/s aspiration or penetration observed. Patient did grimace at times but when asked, denied any discomfort with swallowing. He did exhibit intermittent belching throughout PO trials. SLP observed patient with a single bite of dys 2 solids (diced, canned peaches) with patient exhibiting prolonged mastication, oral holding and delay in anterior to posterior transit. Patient focused on discomfort/pain which he indicates is from foley catheter saying at one point he would "feel better with it out". SLP recommending to continue on full liquids diet, allowing for the full liquids purees as well. SLP will continue to follow.   HPI HPI: 71 y.o. male presented on 11/04/2021 with expressive aphasia - on work-up noted to have acute left MCA stroke with M2 occlusion, underwent mechanical thrombectomy, with TICI 2C revascularization, complicated with small amount of subarachnoid hemorrhage, acute on chronic CHF with cardiogenic shock on vasopressors, AKI on CKD, metabolic encephalopathy, shock liver. 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      SLP Plan  Continue with current plan of care      Recommendations for follow up therapy are one component of a multi-disciplinary discharge planning process, led by the attending physician.  Recommendations  may be updated based on patient status, additional functional criteria and insurance authorization.    Recommendations  Diet recommendations: Thin liquid;Other(comment) (continue full liquids) Liquids provided via: Cup;Straw Medication Administration: Whole meds with liquid Supervision: Patient able to self feed;Full supervision/cueing for compensatory strategies Compensations: Slow rate;Small sips/bites Postural Changes and/or Swallow Maneuvers: Seated upright 90 degrees                Oral Care Recommendations: Oral care BID;Staff/trained caregiver to provide oral care Follow Up Recommendations: Acute inpatient rehab (3hours/day) Assistance recommended at discharge: Frequent or constant Supervision/Assistance SLP Visit Diagnosis: Dysphagia, oral phase (R13.11) Plan: Continue with current plan of care          Angela Nevin, MA, CCC-SLP Speech Therapy

## 2021-11-30 NOTE — Progress Notes (Signed)
Dr. Merrily Pew made aware of difference in results of NIHSS results from previous documentation to today's documentation.

## 2021-11-30 NOTE — TOC Progression Note (Signed)
Transition of Care [facility]) - Progression Note    Patient Details  Name: YOSHITO GAZA MRN: [id number] Date of Birth: 1951/03/15  Transition of Care [facility]) CM/SW Contact  Delilah Shan, LCSWA Phone Number: 11/30/2021, 12:10 PM  Clinical Narrative:     CSW called patients sister and provided SNF bed offers. Patients sister request to review SNF bed offers and will follow back up with CSW tomorrow. CSW informed patients sister will leave medicare compare ratings list with accepted SNF bed offers in patients room. Patients sister will be by tomorrow to pick up. CSW to follow up with patients sister tomorrow on SNF choice. CSW will continue to follow and assist with patients dc planning needs.  Expected Discharge Plan: Skilled Nursing Facility Barriers to Discharge: SNF Pending bed offer  Expected Discharge Plan and Services Expected Discharge Plan: Skilled Nursing Facility     Post Acute Care Choice: Skilled Nursing Facility Living arrangements for the past 2 months: Single Family Home                                       Social Determinants of Health (SDOH) Interventions    Readmission Risk Interventions     No data to display

## 2021-11-30 NOTE — Plan of Care (Signed)
  Problem: Health Behavior/Discharge Planning: Goal: Ability to manage health-related needs will improve Outcome: Progressing   Problem: Self-Care: Goal: Ability to participate in self-care as condition permits will improve Outcome: Progressing Goal: Ability to communicate needs accurately will improve Outcome: Progressing   Problem: Nutrition: Goal: Risk of aspiration will decrease Outcome: Progressing Goal: Dietary intake will improve Outcome: Progressing   Problem: Ischemic Stroke/TIA Tissue Perfusion: Goal: Complications of ischemic stroke/TIA will be minimized Outcome: Progressing   Problem: Spontaneous Subarachnoid Hemorrhage Tissue Perfusion: Goal: Complications of Spontaneous Subarachnoid Hemorrhage will be minimized Outcome: Progressing

## 2021-11-30 NOTE — Progress Notes (Signed)
NAME:  Steve Salazar, MRN:  [id number], DOB:  08-Jun-1950, LOS: 7 ADMISSION DATE:  Dec 17, 2021, CONSULTATION DATE: 11/07/2021 REFERRING MD: Dr. 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  Patient remained hypotensive, required initiation of vasopressor, PCCM was consulted for help evaluation and medical management  Pertinent  Medical History   Past Medical History:  Diagnosis Date   CKD (chronic kidney disease), stage II    COVID-19 virus infection 03/2019   HFrEF (heart failure with reduced ejection fraction) (HCC)    a. 2011 Echo: EF 25-30%; b. 10/2016 Echo: EF 20-25%; c. 11/2019 Echo: EF 20-25%, mild LVH, Gr2 DD, sev dil LA, mod dil RA, mod MR, mild to mod AI, mod PR, Asc Ao 22mm.   Hypertension    NICM (nonischemic cardiomyopathy) (HCC)    a. 2011 Echo: EF 25-30%; b. 2011 MV: basal to dist inf perfusion defect->atten; c. 10/2016 Echo: EF 20-25%; d. 11/2019 Echo: EF 20-25%; e. 11/2019 MV: No ischemia. Fixed inflat defect, inflat HK, EF 17%; f. 03/2020 Cath: Nl cors. CO/CI 4.07/1.92.   PSVT (paroxysmal supraventricular tachycardia) (HCC)    a. 12/2016 Event monitor: rare, short episodes of SVT.    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 Renal function and LFTs both elevated   Interim History / Subjective:   Remains on dobutamine 7.5 metolazone, and Lasix infusion Coox 55% Serum creatinine continue to trend up with decrease in urine output   Objective   Blood pressure 104/75, pulse 89, temperature 97.6 F (36.4 C), temperature source Oral, resp. rate 20, height 6' (1.829 m), weight 86.8 kg, SpO2 92 %. CVP:  [6 mmHg-26 mmHg] 19 mmHg      Intake/Output Summary (Last 24 hours) at 11/30/2021 1114 Last data filed at 11/30/2021 1000 Gross per 24 hour  Intake 1592.45 ml  Output 920 ml  Net 672.45 ml   Filed Weights   11/28/21 0315 11/29/21 0445 11/30/21 0426  Weight: 88.5 kg 88.4 kg 86.8 kg    Examination: Physical exam: General: Acute on chronically ill-appearing male, sitting on recliner HEENT: [place]/AT, eyes anicteric.  moist mucus membranes Neuro: Alert, awake following commands.  Expressive aphasia, antigravity in all 4 extremities Chest: Coarse breath sounds, no wheezes or rhonchi Heart: Irregularly irregular, no murmurs or gallops Abdomen: Soft, nontender, nondistended, bowel sounds present Skin: No rash  LFTs are but is stable and serum creatinine trended up to 3.6  Resolved Hospital Problem list     Assessment & Plan:  Acute left MCA stroke s/p thrombectomy Etiology cardioembolic in the setting of afib one eliquis and cardiomyopathy with low EF.  Residual deficits include aphasia and slight R sided weakness Continue secondary stroke prophylaxis Stroke team is following Echo was negative for LV thrombus  Postprocedure small subarachnoid hemorrhage-MRI confirmed left MCA territory stroke with small amount of subarachnoid hemorrhage; improved on f/u imaging Continue neuro watch Neurology is following  Acute on chronic systolic CHF with cardiogenic shock Shock  liver Appreciate heart failure team follow-up Continue dobutamine at 7.5, Co-ox dropped to 55%   Levophed was titrated off Urine output remained low at 1.4 L Currently on Lasix infusion, continue metolazone LFTs are  up but stable  Chronic atrial fibrillation-Anticoagulated at baseline with Eliquis Heart rate remain well controlled Continue amiodarone Continue IV heparin for now, will switch it to Eliquis  AKI on CKD stage III a due to cardiorenal syndrome- worsened by dye load Frequent ectopy Serum creatinine continue to rise, his urine output is decreasing now  Monitor serum creatinine Avoid nephrotoxic agents Monitor intake and output  Hypovolemic hyponatremia Sodium is at 127 Closely monitor serum sodium  Dysphagia due to stroke Patient's family stated that patient has been having cough any chokes when he eats Had repeat swallow evaluation, on full liquid diet  Overall prognosis: Poor Palliative care is following, goals of care meeting is a scheduled for this Friday (tomorrow)  Best Practice (right click and "Reselect all SmartList Selections" daily)   Diet/type: Full liquid diet DVT prophylaxis: Systemic heparin GI prophylaxis: N/A Lines: yes and it is still needed as port Foley:  Yes, and it is still needed Code Status:  full code Last date of multidisciplinary goals of care discussion: 7/29 see IPAL note   Total critical care time: 32 minutes  Performed by: Cheri Fowler   Critical care time was exclusive of separately billable procedures and treating other patients.   Critical care was necessary to treat or prevent imminent or life-threatening deterioration.   Critical care was time spent personally by me on the following activities: development of treatment plan with patient and/or surrogate as well as nursing, discussions with consultants, evaluation of patient's response to treatment, examination of patient, obtaining history from patient or surrogate, ordering and performing treatments and interventions, ordering and review of laboratory studies, ordering and review of radiographic studies, pulse oximetry and re-evaluation of patient's condition.   Cheri Fowler,  MD [facility] See Amion for pager If no response to pager, please call [phone] until 7pm After 7pm, Please call E-link [phone]

## 2021-12-01 DIAGNOSIS — R570 Cardiogenic shock: Secondary | ICD-10-CM | POA: Diagnosis not present

## 2021-12-01 DIAGNOSIS — I63412 Cerebral infarction due to embolism of left middle cerebral artery: Secondary | ICD-10-CM | POA: Diagnosis not present

## 2021-12-01 DIAGNOSIS — N179 Acute kidney failure, unspecified: Secondary | ICD-10-CM | POA: Diagnosis not present

## 2021-12-01 LAB — COOXEMETRY PANEL
Carboxyhemoglobin: 1.4 % (ref 0.5–1.5)
Methemoglobin: <0.7 % (ref 0.0–1.5)
O2 Saturation: 62 %
Total hemoglobin: 13.2 g/dL (ref 12.0–16.0)

## 2021-12-01 LAB — CBC
HCT: 36.7 % — ABNORMAL LOW (ref 39.0–52.0)
Hemoglobin: 12.6 g/dL — ABNORMAL LOW (ref 13.0–17.0)
MCH: 29.2 pg (ref 26.0–34.0)
MCHC: 34.3 g/dL (ref 30.0–36.0)
MCV: 85.2 fL (ref 80.0–100.0)
Platelets: 171 10*3/uL (ref 150–400)
RBC: 4.31 MIL/uL (ref 4.22–5.81)
RDW: 13.9 % (ref 11.5–15.5)
WBC: 6.9 10*3/uL (ref 4.0–10.5)
nRBC: 0.4 % — ABNORMAL HIGH (ref 0.0–0.2)

## 2021-12-01 LAB — BASIC METABOLIC PANEL
Anion gap: 14 (ref 5–15)
BUN: 66 mg/dL — ABNORMAL HIGH (ref 8–23)
CO2: 23 mmol/L (ref 22–32)
Calcium: 8.7 mg/dL — ABNORMAL LOW (ref 8.9–10.3)
Chloride: 90 mmol/L — ABNORMAL LOW (ref 98–111)
Creatinine, Ser: 4.45 mg/dL — ABNORMAL HIGH (ref 0.61–1.24)
GFR, Estimated: 13 mL/min — ABNORMAL LOW (ref >60)
Glucose, Bld: 100 mg/dL — ABNORMAL HIGH (ref 70–99)
Potassium: 3.5 mmol/L (ref 3.5–5.1)
Sodium: 127 mmol/L — ABNORMAL LOW (ref 135–145)

## 2021-12-01 LAB — MAGNESIUM: Magnesium: 2.5 mg/dL — ABNORMAL HIGH (ref 1.7–2.4)

## 2021-12-01 LAB — GLUCOSE, CAPILLARY
Glucose-Capillary: 103 mg/dL — ABNORMAL HIGH (ref 70–99)
Glucose-Capillary: 110 mg/dL — ABNORMAL HIGH (ref 70–99)
Glucose-Capillary: 121 mg/dL — ABNORMAL HIGH (ref 70–99)
Glucose-Capillary: 144 mg/dL — ABNORMAL HIGH (ref 70–99)
Glucose-Capillary: 146 mg/dL — ABNORMAL HIGH (ref 70–99)
Glucose-Capillary: 98 mg/dL (ref 70–99)

## 2021-12-01 NOTE — Plan of Care (Signed)
  Problem: Education: Goal: Knowledge of disease or condition will improve Outcome: Progressing Goal: Knowledge of secondary prevention will improve (SELECT ALL) Outcome: Progressing Goal: Individualized Educational Video(s) Outcome: Progressing   Problem: Health Behavior/Discharge Planning: Goal: Ability to manage health-related needs will improve Outcome: Progressing   Problem: Self-Care: Goal: Ability to participate in self-care as condition permits will improve Outcome: Progressing Goal: Ability to communicate needs accurately will improve Outcome: Progressing   Problem: Nutrition: Goal: Risk of aspiration will decrease Outcome: Progressing   Problem: Ischemic Stroke/TIA Tissue Perfusion: Goal: Complications of ischemic stroke/TIA will be minimized Outcome: Progressing   Problem: Spontaneous Subarachnoid Hemorrhage Tissue Perfusion: Goal: Complications of Spontaneous Subarachnoid Hemorrhage will be minimized Outcome: Progressing

## 2021-12-01 NOTE — Progress Notes (Signed)
SLP Cancellation Note  Patient Details Name: Steve Salazar MRN: [id number] DOB: [birth year]   Cancelled treatment:        Attempted to see pt for ongoing dysphagia and aphasia therapy.  Pt working with PT at time of attempt.  Discussed with RNs.  Pt continues with poor PO intake.  Pt continues with cognitive and/or sensory based dysphagia with gagging and holding/expectoration of purees and requires consistent cues to swallow pills.  Pt is drinking liquids without difficulty.  Pt may continue full liquid diet and offer puree textures, but have suction available.  Pt is likely to have poor PO intake of more solid textures.  Pt may not be able to meet nutritional needs orally.  Planned discussion around goals of care later this afternoon, considering complicated renal and cardiac status. Recommend discussion around alternate means of nutrition as well including possibility for PEG if PO intake does not improve.   Kerrie Pleasure, MA, CCC-SLP Acute Rehabilitation Services Office: [phone] 12/01/2021, 11:19 AM

## 2021-12-01 NOTE — Progress Notes (Signed)
NAME:  Steve Salazar, MRN:  [id number], DOB:  1950/07/01, LOS: 8 ADMISSION DATE:  11/17/2021, CONSULTATION DATE: 12-14-21 REFERRING MD: Dr. 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  Patient remained hypotensive, required initiation of vasopressor, PCCM was consulted for help evaluation and medical management  Pertinent  Medical History   Past Medical History:  Diagnosis Date   CKD (chronic kidney disease), stage II    COVID-19 virus infection 03/2019   HFrEF (heart failure with reduced ejection fraction) (HCC)    a. 2011 Echo: EF 25-30%; b. 10/2016 Echo: EF 20-25%; c. 11/2019 Echo: EF 20-25%, mild LVH, Gr2 DD, sev dil LA, mod dil RA, mod MR, mild to mod AI, mod PR, Asc Ao 34mm.   Hypertension    NICM (nonischemic cardiomyopathy) (HCC)    a. 2011 Echo: EF 25-30%; b. 2011 MV: basal to dist inf perfusion defect->atten; c. 10/2016 Echo: EF 20-25%; d. 11/2019 Echo: EF 20-25%; e. 11/2019 MV: No ischemia. Fixed inflat defect, inflat HK, EF 17%; f. 03/2020 Cath: Nl cors. CO/CI 4.07/1.92.   PSVT (paroxysmal supraventricular tachycardia) (HCC)    a. 12/2016 Event monitor: rare, short episodes of SVT.    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 Renal function and LFTs both elevated   Interim History / Subjective:   Remains on dobutamine 7.5, on Coox 62%  Lasix and metolazone stopped  Off levophed  Serum Cr continue to trended up with Dec urine output  Objective   Blood pressure 113/82, pulse 92, temperature 98.2 F (36.8 C), temperature source Oral, resp. rate 12, height 6' (1.829 m), weight 87.1 kg, SpO2 97 %. CVP:  [1 mmHg-21 mmHg] 10 mmHg      Intake/Output Summary (Last 24 hours) at 12/01/2021 1305 Last data filed at 12/01/2021 1200 Gross per 24 hour  Intake 407.13 ml  Output 250 ml  Net 157.13 ml   Filed Weights   11/29/21 0445 11/30/21 0426 12/01/21 0500  Weight: 88.4 kg 86.8 kg 87.1 kg    Examination: Physical exam: General: Acute on chronically ill-appearing male, sitting on recliner HEENT: [place]/AT, eyes anicteric.  moist mucus membranes Neuro: Alert, awake following commands.  Receptive and Expressive aphasia, antigravity in all 4 extremities Chest: Coarse breath sounds, no wheezes or rhonchi Heart: Irregularly irregular, no murmurs or gallops Abdomen: Soft, nontender, nondistended, bowel sounds present Skin: No rash  Serum creatinine trended up to 4.45  Resolved Hospital Problem list     Assessment & Plan:  Acute left MCA stroke s/p thrombectomy Etiology cardioembolic in the setting of afib one eliquis and cardiomyopathy with low EF.  Residual deficits include aphasia and slight R sided weakness Continue secondary stroke prophylaxis Stroke team is following Echo was negative for LV thrombus  Postprocedure small subarachnoid hemorrhage-MRI confirmed left MCA territory stroke with small amount of subarachnoid hemorrhage; improved on f/u imaging Continue neuro watch Neurology is following  Acute on chronic systolic CHF with cardiogenic shock Shock  liver Appreciate heart failure team follow-up Continue dobutamine at 7.5, Co-ox dropped to 62%   Urine output remained low at His CVP is 8, diuretics were stopped per discussion with Heart failure team LFTs  stable  Chronic atrial fibrillation-Anticoagulated at baseline with Eliquis Heart rate remain well controlled Continue amiodarone Continue Eliquis  AKI on CKD stage III a due to cardiorenal syndrome- worsened by dye load Frequent ectopy Serum creatinine continue to rise, his urine output is decreasing now  Monitor serum creatinine Avoid nephrotoxic agents Monitor intake and output Diuretic were stopped  Hypovolemic hyponatremia Sodium is at 127 Closely monitor serum sodium  Dysphagia due to stroke Will place cortrak today Start tube feeds  Palliative care is following, goals of care meeting is a scheduled for this Friday (tomorrow)  Best Practice (right click and "Reselect all SmartList Selections" daily)   Diet/type: Full liquid diet, Tube feeds once cortrak in place DVT prophylaxis: DOAC GI prophylaxis: N/A Lines: yes and it is still needed as port Foley:  Yes, and it is still needed Code Status:  full code Last date of multidisciplinary goals of care discussion: 7/29 see IPAL note   Total critical care time: 34 minutes  Performed by: Cheri Fowler   Critical care time was exclusive of separately billable procedures and treating other patients.   Critical care was necessary to treat or prevent imminent or life-threatening deterioration.   Critical care was time spent personally by me on the following activities: development of treatment plan with patient and/or surrogate as well as nursing, discussions with consultants, evaluation of patient's response to treatment, examination of patient, obtaining history from patient or surrogate, ordering and performing treatments and interventions, ordering and review of laboratory studies, ordering and review of radiographic studies, pulse oximetry and re-evaluation of patient's condition.   Cheri Fowler, MD [facility] See Amion for pager If no response to pager, please call [phone] until 7pm After 7pm,  Please call E-link [phone]

## 2021-12-01 NOTE — Progress Notes (Signed)
Occupational Therapy Treatment Patient Details Name: Steve Salazar MRN: [id number] DOB: 07-22-1950 Today's Date: 12/01/2021   History of present illness 71 y.o. male admitted to [facility] Dec 16, 2021 with acute aphasia, transfered to [facility] for thrombectomy of acute left M2 occlusion complicated with small amount of subarachnoid hemorrhage. PMHx:Gout, CKD2, NICM, HF with EF 15-20%, HTN, PSVT, ICD, Afib   OT comments  Patient with good progress toward patient focused goals.  Able to bathe and dress himself sinkside with setup, cues, Min A for upper body dressing and Min Guard for lower body ADL.  Continues to present with weakness to right upper extremity compared to the left, inattention to right visual field, decreased initiation and perseveration with grooming tasks.  OT will follow in the acute setting with AIR continuing to be recommended for post acute rehab prior to returning home.  OT will look to test cognition and right visual field.     Recommendations for follow up therapy are one component of a multi-disciplinary discharge planning process, led by the attending physician.  Recommendations may be updated based on patient status, additional functional criteria and insurance authorization.    Follow Up Recommendations  Acute inpatient rehab (3hours/day)    Assistance Recommended at Discharge Frequent or constant Supervision/Assistance  Patient can return home with the following  A lot of help with walking and/or transfers;A lot of help with bathing/dressing/bathroom;Assistance with cooking/housework;Assistance with feeding;Help with stairs or ramp for entrance;Assist for transportation;Direct supervision/assist for medications management;Direct supervision/assist for financial management   Equipment Recommendations  None recommended by OT    Recommendations for Other Services      Precautions / Restrictions Precautions Precautions: Fall Precaution Comments: aphasia, R  inattention Restrictions Weight Bearing Restrictions: No       Mobility Bed Mobility               General bed mobility comments: in recliner on arrival and end of session    Transfers Overall transfer level: Needs assistance Equipment used: None Transfers: Sit to/from Stand Sit to Stand: Supervision, Min guard                 Balance Overall balance assessment: Needs assistance Sitting-balance support: No upper extremity supported Sitting balance-Leahy Scale: Good     Standing balance support: No upper extremity supported Standing balance-Leahy Scale: Fair                             ADL either performed or assessed with clinical judgement   ADL       Grooming: Set up;Cueing for sequencing;Sitting   Upper Body Bathing: Set up;Sitting   Lower Body Bathing: Min guard;Sit to/from stand   Upper Body Dressing : Minimal assistance;Sitting Upper Body Dressing Details (indicate cue type and reason): line management Lower Body Dressing: Min guard;Sit to/from stand   Toilet Transfer: Min guard;Ambulation;Regular Toilet                  Extremity/Trunk Assessment Upper Extremity Assessment RUE Deficits / Details: dropping items with R hand when not using vision to attend.  Able to use R hand to grasp washcloth and bathe himself and comb his hair RUE Coordination: decreased fine motor       Cervical / Trunk Assessment Cervical / Trunk Assessment: Normal    Vision   Additional Comments: Better spontaneous scanning to his right noted, but cueing needed at times.   Perception Perception Perception: Impaired Inattention/Neglect: Does  not attend to right visual field   Praxis Praxis Praxis: Impaired Praxis Impairment Details: Perseveration;Initiation    Cognition Arousal/Alertness: Awake/alert Behavior During Therapy: WFL for tasks assessed/performed                       Current Attention Level: Sustained   Following  Commands: Follows one step commands inconsistently, Follows one step commands with increased time Safety/Judgement: Decreased awareness of deficits   Problem Solving: Slow processing, Requires verbal cues                             Pertinent Vitals/ Pain       Pain Assessment Pain Assessment: No/denies pain Pain Intervention(s): Monitored during session                                                          Frequency  Min 2X/week        Progress Toward Goals  OT Goals(current goals can now be found in the care plan section)  Progress towards OT goals: Progressing toward goals  Acute Rehab OT Goals OT Goal Formulation: With family Time For Goal Achievement: 12/22/21 Potential to Achieve Goals: Good ADL Goals Pt Will Perform Grooming: with set-up;standing Pt Will Perform Upper Body Dressing: with set-up;standing;sitting Pt Will Perform Lower Body Dressing: with set-up;sit to/from stand Pt Will Transfer to Toilet: with modified independence;ambulating;regular height toilet  Plan Discharge plan remains appropriate    Co-evaluation                 AM-PAC OT "6 Clicks" Daily Activity     Outcome Measure   Help from another person eating meals?: None Help from another person taking care of personal grooming?: None Help from another person toileting, which includes using toliet, bedpan, or urinal?: A Little Help from another person bathing (including washing, rinsing, drying)?: A Little Help from another person to put on and taking off regular upper body clothing?: A Little Help from another person to put on and taking off regular lower body clothing?: A Little 6 Click Score: 20    End of Session    OT Visit Diagnosis: Unsteadiness on feet (R26.81);Other abnormalities of gait and mobility (R26.89);Low vision, both eyes (H54.2);Other symptoms and signs involving cognitive function;Cognitive communication deficit (R41.841)    Activity Tolerance Patient tolerated treatment well   Patient Left in chair;with call bell/phone within reach;with chair alarm set;with family/visitor present   Nurse Communication Other (comment) (patient bathed and dressed himself - assist as needed)        Time: 1030-1050 OT Time Calculation (min): 20 min  Charges: OT General Charges $OT Visit: 1 Visit OT Treatments $Self Care/Home Management : 8-22 mins  12/01/2021  RP, OTR/L  Acute Rehabilitation Services  Office:  [phone]   Suzanna Obey 12/01/2021, 11:00 AM

## 2021-12-01 NOTE — Progress Notes (Addendum)
Patient ID: Steve Salazar, male   DOB: 07-Jan-1951, 71 y.o.   MRN: [id number]     Advanced Heart Failure Rounding Note  PCP-Cardiologist: [name], MD   Subjective:    7/31: Milrinone d/c 2/2 hypotension. Transitioned to DBA 5 + Lasix gtt at 15/hr. Midodrine added.  8/1: Co-ox 48%, DBA increased to 7.5. Metolazone added to augment diuresis  8/2: Lasix gtt increased to 20/hr + 2.5 metolazone  8/3: NE weaned off. Lasix gtt off. Started PO torsemide.   CO-OX 62% on 7.5 DBA  Off lasix gtt. Currently on po Torsemide 80 BID. Scr continues to trend up, 4.45 this am. 300 cc UOP charted yesterday. CVP 16   SBP 90s-110s on midodrine 10 TID  Seen while working with PT. Neglecting right side. Denies dyspnea. Significant other reports he has been holding food in his mouth, needs prompts to swallow.   Objective:   Weight Range: 87.1 kg Body mass index is 26.04 kg/m.   Vital Signs:   Temp:  [97.4 F (36.3 C)-98.6 F (37 C)] 97.4 F (36.3 C) (08/04 0341) Pulse Rate:  [81-94] 88 (08/04 0600) Resp:  [0-29] 26 (08/04 0600) BP: (92-138)/(62-99) 118/85 (08/04 0600) SpO2:  [88 %-97 %] 96 % (08/04 0600) Weight:  [87.1 kg] 87.1 kg (08/04 0500) Last BM Date : 12/01/21  Weight change: Filed Weights   11/29/21 0445 11/30/21 0426 12/01/21 0500  Weight: 88.4 kg 86.8 kg 87.1 kg    Intake/Output:   Intake/Output Summary (Last 24 hours) at 12/01/2021 0809 Last data filed at 12/01/2021 0600 Gross per 24 hour  Intake 280.34 ml  Output 300 ml  Net -19.66 ml      Physical Exam    CVP 16 General:  Sitting up in chair. No distress. HEENT: normal Neck: supple. JVP to ear Carotids 2+ bilat; no bruits.  Cor: PMI nondisplaced. Regular rate & rhythm. No rubs, gallops or murmurs, + R subclavian CVC Lungs: clear Abdomen: soft, nontender, nondistended. Extremities: no cyanosis, clubbing, rash, 1+ edema Neuro: alert & orientedx3, cranial nerves grossly intact. moves all 4 extremities w/o  difficulty. Affect pleasant    Telemetry   Atrial fibrillation with BiV pacing, 90s, ~ 5 PVCs/min (personally reviewed)  Labs    CBC Recent Labs    11/30/21 0355 12/01/21 0340  WBC 8.2 6.9  HGB 13.6 12.6*  HCT 39.6 36.7*  MCV 86.3 85.2  PLT 179 171   Basic Metabolic Panel Recent Labs    95/62/13 0412 11/30/21 0355 11/30/21 1619 12/01/21 0340  NA 129* 127* 129* 127*  K 3.7 3.5 3.6 3.5  CL 94* 92* 91* 90*  CO2 23 24 23 23   GLUCOSE 103* 104* 95 100*  BUN 51* 55* 66* 66*  CREATININE 3.34* 3.62* 4.12* 4.45*  CALCIUM 8.5* 8.5* 8.8* 8.7*  MG 2.2 2.4  --  2.5*  PHOS 6.4* 6.5*  --   --    Liver Function Tests Recent Labs    11/29/21 0412 11/30/21 0355  AST 52* 44*  ALT 78* 73*  ALKPHOS 57 58  BILITOT 2.4* 2.5*  PROT 5.5* 5.7*  ALBUMIN 2.9* 2.9*   No results for input(s): "LIPASE", "AMYLASE" in the last 72 hours. Cardiac Enzymes No results for input(s): "CKTOTAL", "CKMB", "CKMBINDEX", "TROPONINI" in the last 72 hours.  BNP: BNP (last 3 results) No results for input(s): "BNP" in the last 8760 hours.  ProBNP (last 3 results) No results for input(s): "PROBNP" in the last 8760 hours.   D-Dimer  No results for input(s): "DDIMER" in the last 72 hours. Hemoglobin A1C No results for input(s): "HGBA1C" in the last 72 hours.  Fasting Lipid Panel No results for input(s): "CHOL", "HDL", "LDLCALC", "TRIG", "CHOLHDL", "LDLDIRECT" in the last 72 hours.  Thyroid Function Tests No results for input(s): "TSH", "T4TOTAL", "T3FREE", "THYROIDAB" in the last 72 hours.  Invalid input(s): "FREET3"  Other results:   Imaging    No results found.   Medications:     Scheduled Medications:  amiodarone  200 mg Oral BID   apixaban  5 mg Oral BID   Chlorhexidine Gluconate Cloth  6 each Topical Daily   feeding supplement  1 Container Oral TID BM   insulin aspart  0-15 Units Subcutaneous Q4H   midodrine  10 mg Oral Q8H   ondansetron (ZOFRAN) IV  4 mg Intravenous Once    oxybutynin  5 mg Oral TID   simethicone  80 mg Oral QID   torsemide  80 mg Oral BID    Infusions:  sodium chloride     sodium chloride     sodium chloride     sodium chloride     DOBUTamine 7.5 mcg/kg/min (12/01/21 0600)   norepinephrine (LEVOPHED) Adult infusion Stopped (11/30/21 0725)    PRN Medications: Place/Maintain arterial line **AND** sodium chloride, Place/Maintain arterial line **AND** sodium chloride, acetaminophen **OR** acetaminophen (TYLENOL) oral liquid 160 mg/5 mL **OR** acetaminophen, mouth rinse, senna-docusate    Assessment/Plan   1. CVA: Acute left MCA CVA, likely cardioembolic.  Has dilated cardiomyopathy, also chronic atrial fibrillation. Echo did not show LV thrombus.  He is now s/p thrombectomy with residual small SAH. Per family, he probably was not taking his Eliquis at home.  C/w expressive aphasia with right-sided weakness.  - Heparin switched to Eliquis (not a drug failure as he apparently was not taking it). - Passed swallow eval. However, concern for risk of aspiration. Holds food in mouth and needs prompts to swallow. May need CorTrak if continue aggressive care. 2. Atrial fibrillation: Chronic, s/p AV nodal ablation.  Had successful DCCV in 6/23 but now back in AF.  - Eliquis 5 mg bid   3. AKI on CKD stage 3: Baseline creatinine 1.8-2 range, suspect cardiorenal syndrome. SCr peaked up to 3.54 (patient initially was hypotensive and off his home inotrope). Restarted on milrinone but later discontinued due to soft BPs. Now on DBA 7.5 + NE 7. UOP sluggish despite support and high dose lasix gtt. SCr trending up 3.20>>3.34>>3.62>>4.1>4.5. - continue current support for now, but suspect we are nearing palliative situation w/ end-stage HF. Not candidate for HD  4. Acute on chronic systolic CHF:  NICM, found as early as 2011.  Last cath in 12/21 with normal coronaries.  Echo this admission with EF 20%, severe LV dilation, severe biatrial enlargement, moderate  RV enlargement with mildly decreased systolic function, mod-severe MR, moderate TR, dilated IVC.  Has [employer] Jude CRT-D device.  He has known low output/end stage HF, was started on milrinone gtt 0.2 at [facility] in 12/22.  He so far has refused LVAD.  He had been off milrinone initially after admission, co-ox was 40% with AKI.  Milrinone restarted 7/29 at 0.375 and NE added to maintain MAP (had been on vasopressin). Milrinone later discontinued due to soft BPs. Now on DBA 7.5. NE weaned off yesterday. CO-OX 62%. UOP sluggish. Lasix gtt switched to po Torsemide 80 BID yesterday. UOP remains sluggish. CVP 16. SCr trending up 3.20>>3.34>>3.62>4.5. Appears to be end-stage HF.  -  There is consideration in notes about barostimulator placement, however not sure this would be very helpful with such advanced HF (inotrope-dependent).  - Patient has refused LVAD in the past, now with large left MCA CVA.  He is not currently an LVAD candidate with the acute CVA (will need to see functional recovery), and given apparent lack of compliance with apixaban would be concerned about his compliance with warfarin if an LVAD were implanted. Now with rising creatinine and decreasing UOP we are heading in the wrong direction.  Palliative care is following. Family meeting planned for today. Appear to be moving towards a palliative/hospice situation given MSOF. Currently full code. DNR would be appropriate. 5. Mitral regurgitation: Moderate-severe functional MR.  LVESD > 7 cm, would not be Mitraclip candidate.  6. PVCs/NSVT: less frequent overnight. Continue amiodarone po.  - keep K > 4.0 and Mg > 2.0    GOC: -Palliative Care following. Family meeting planned today. -Not a candidate for advanced therapies. Now with MSOF. Has end-stage HF. To complicate things had CVA and now with progressive AKI. Not a candidate for HD.  -Appear to be moving towards comfort measures/hospice. -Full code. DNR would be appropriate.  Length of Stay:  8  FINCH, LINDSAY N, PA-C  12/01/2021, 8:09 AM  Advanced Heart Failure Team Pager [phone] (M-F; 7a - 5p)  Please contact [facility] for night-coverage after hours (5p -7a ) and weekends on [email or web address]  Patient seen with PA, agree with the above note.   Co-ox 62% with morning, CVP 8 after flushing PICC.  Creatinine up to 4.45 with minimal urine output (300 cc).  He remains on dobutamine 7.5 + midodrine 10 tid with stable MAP.    He walked in halls this morning.  He remains aphasic with right-sided neglect, also periodic confusion.    General: NAD Neck: No JVD, no thyromegaly or thyroid nodule.  Lungs: Clear to auscultation bilaterally with normal respiratory effort. CV: Nondisplaced PMI.  Heart regular S1/S2, no S3/S4, no murmur.  No peripheral edema.   Abdomen: Soft, nontender, no hepatosplenomegaly, no distention.  Skin: Intact without lesions or rashes.  Neurologic: Expressive aphasia with right-sided neglect.  Psych: Normal affect. Extremities: No clubbing or cyanosis.  HEENT: Normal.   Creatinine continues to rise with poor UOP.  Suspect ATN.  CVP is only 8 today when PICC flushed and he does not look volume overloaded to me on exam.  - Hold diuretic today.  - Encourage po hydration.   Co-ox adequate at 62% on dobutamine 7.5 with stable MAP.   Long-term prognosis remains poor, would not be HD candidate and not LVAD candidate.  Our hope at this point would be renal recovery then home on dobutamine gtt. Family meeting with palliative care today.   CRITICAL CARE Performed by: Loralie Champagne  Total critical care time: 40 minutes  Critical care time was exclusive of separately billable procedures and treating other patients.  Critical care was necessary to treat or prevent imminent or life-threatening deterioration.  Critical care was time spent personally by me on the following activities: development of treatment plan with patient and/or surrogate as well as nursing,  discussions with consultants, evaluation of patient's response to treatment, examination of patient, obtaining history from patient or surrogate, ordering and performing treatments and interventions, ordering and review of laboratory studies, ordering and review of radiographic studies, pulse oximetry and re-evaluation of patient's condition.  Loralie Champagne. 12/01/2021 9:06 AM

## 2021-12-01 NOTE — Progress Notes (Signed)
Physical Therapy Treatment Patient Details Name: Steve Salazar MRN: [id number] DOB: 24-Jan-1951 Today's Date: 12/01/2021   History of Present Illness 71 y.o. male admitted to [facility] 11/04/2021 with acute aphasia, transfered to [facility] for thrombectomy of acute left M2 occlusion complicated with small amount of subarachnoid hemorrhage. PMHx:Gout, CKD2, NICM, HF with EF 15-20%, HTN, PSVT, ICD, Afib    PT Comments    Pt pleasant responding with appropriate joking and conversational context with maintained expressive and receptive aphasia. Pt with improved single step command following grossly 75% of the time but only following 2 step commands grossly 50% of the time. Pt with significant difficulty with visual attention to right to find objects vs receptive aphasia in these instances. Improved gait without AD but unable to wayfind or follow directional cues. Will continue to follow. No episodes of hypotension or fatigue during gait.   117/105 before gait 147/105 post gait Hr 91 95% on RA    Recommendations for follow up therapy are one component of a multi-disciplinary discharge planning process, led by the attending physician.  Recommendations may be updated based on patient status, additional functional criteria and insurance authorization.  Follow Up Recommendations  Skilled nursing-short term rehab (<3 hours/day) Can patient physically be transported by private vehicle: Yes   Assistance Recommended at Discharge Frequent or constant Supervision/Assistance  Patient can return home with the following Assistance with cooking/housework;Direct supervision/assist for medications management;Assist for transportation;Direct supervision/assist for financial management;Help with stairs or ramp for entrance;A little help with walking and/or transfers;A little help with bathing/dressing/bathroom   Equipment Recommendations  BSC/3in1    Recommendations for Other Services       Precautions / Restrictions  Precautions Precautions: Fall Precaution Comments: aphasia, R inattention; SBP 110-140     Mobility  Bed Mobility               General bed mobility comments: in recliner on arrival and end of session    Transfers Overall transfer level: Needs assistance   Transfers: Sit to/from Stand Sit to Stand: Min guard           General transfer comment: guarding for safety, lines and direction. pt with good control of descent and bil UE use on armrests to rise    Ambulation/Gait Ambulation/Gait assistance: Min assist Gait Distance (Feet): 440 Feet Assistive device: Rolling walker (2 wheels), None Gait Pattern/deviations: Step-through pattern, Decreased stride length   Gait velocity interpretation: 1.31 - 2.62 ft/sec, indicative of limited community ambulator   General Gait Details: pt walked around unit with RW with min cues and assist for direction and safety, good stride with even partial "strut" during gait. last 5' without use of AD with steady gait and assist for lines and safety. Pt remains unable to follow cues for turning to right or 2 step commands for direction and way finding   Stairs             Wheelchair Mobility    Modified Rankin (Stroke Patients Only) Modified Rankin (Stroke Patients Only) Pre-Morbid Rankin Score: No symptoms Modified Rankin: Moderately severe disability     Balance Overall balance assessment: Needs assistance   Sitting balance-Leahy Scale: Good Sitting balance - Comments: static sitting without assist   Standing balance support: Bilateral upper extremity supported, No upper extremity supported, During functional activity Standing balance-Leahy Scale: Good Standing balance comment: pt able to walk with and without RW  Cognition Arousal/Alertness: Awake/alert Behavior During Therapy: WFL for tasks assessed/performed Overall Cognitive Status: Impaired/Different from baseline Area of  Impairment: Safety/judgement, Attention, Problem solving, Orientation, Memory                 Orientation Level: Disoriented to, Time, Place Current Attention Level: Sustained Memory: Decreased short-term memory Following Commands: Follows one step commands inconsistently, Follows one step commands with increased time, Follows multi-step commands inconsistently Safety/Judgement: Decreased awareness of safety, Decreased awareness of deficits Awareness: Intellectual Problem Solving: Slow processing, Requires verbal cues, Requires tactile cues General Comments: pt stating his name and Teresa's. Pt unable to accurately pick location out of choices, remains inattentive to right side and even when attending does not seem to visualize items to right but ?receptive aphasia vs sight        Exercises General Exercises - Lower Extremity Long Arc Quad: AROM, Both, 10 reps, Seated Hip Flexion/Marching: AROM, Both, 10 reps, Seated    General Comments        Pertinent Vitals/Pain Pain Assessment Pain Assessment: No/denies pain    Home Living                          Prior Function            PT Goals (current goals can now be found in the care plan section) Progress towards PT goals: Progressing toward goals    Frequency    Min 3X/week      PT Plan Current plan remains appropriate    Co-evaluation              AM-PAC PT "6 Clicks" Mobility   Outcome Measure  Help needed turning from your back to your side while in a flat bed without using bedrails?: A Little Help needed moving from lying on your back to sitting on the side of a flat bed without using bedrails?: A Little Help needed moving to and from a bed to a chair (including a wheelchair)?: A Little Help needed standing up from a chair using your arms (e.g., wheelchair or bedside chair)?: A Little Help needed to walk in hospital room?: A Little Help needed climbing 3-5 steps with a railing? : A  Lot 6 Click Score: 17    End of Session Equipment Utilized During Treatment: Gait belt Activity Tolerance: Patient tolerated treatment well Patient left: in chair;with call bell/phone within reach;with chair alarm set;with family/visitor present;with nursing/sitter in room Nurse Communication: Mobility status PT Visit Diagnosis: Other symptoms and signs involving the nervous system (R29.898);Other abnormalities of gait and mobility (R26.89);Difficulty in walking, not elsewhere classified (R26.2)     Time: 0277-4128 PT Time Calculation (min) (ACUTE ONLY): 28 min  Charges:  $Gait Training: 8-22 mins $Therapeutic Activity: 8-22 mins                     Merryl Hacker, PT Acute Rehabilitation Services Office: [phone]    Steve Salazar 12/01/2021, 9:39 AM

## 2021-12-01 NOTE — Progress Notes (Addendum)
                                                     Palliative Care Progress Note   Patient Name: Steve Salazar       Date: 12/01/2021 DOB: November 11, 1950  Age: 71 y.o. MRN#: [id number] Attending Physician: Cheri Fowler, MD Primary Care Physician: [name], MD Admit Date: 11/07/2021  As per chart review, other provider notes reflect that there is a goals of care discussion planned for today, 8/4.  No specific day or time for goals of care discussion has been scheduled with PMT.  To clarify if/when GOC discussion is planned, I spoke with patient's sister Eber Jones over the phone.  Eber Jones shared she and family have scheduled to meet and discuss code status and plan of care tomorrow, 8/5. Eber Jones stated no PMT provider was needed for this family-only discussion but appreciates our ongoing support.   Eber Jones shares the plan moving forward is to have patient placed in a skilled nursing facility and is waiting to know which SNF's are available to patient.  Family is facing anticipatory care needs and advanced care planning decisions.  I offered to discussed CODE STATUS and goals of care with Eber Jones.  She declined and has no questions or concerns at this time.   I assured her that the palliative medicine team remains available to patient and family to assist with palliative needs and GOC discussions.  Thank you for allowing the Palliative Medicine Team to assist in the care of [name].  NO CHARGE  Aarianna Hoadley L. Manon Hilding, FNP-BC Palliative Medicine Team Team Phone # [phone]

## 2021-12-01 NOTE — Progress Notes (Signed)
Cortrak Tube Team Note:  Consult received to place a Cortrak feeding tube. RD attempted placement of tube, placement was unsuccessful and stopped due to pt resistance. RN notified MD and MD ok with no placement.     Kirby Crigler RD, LDN Clinical Dietitian See Loretha Stapler for contact information.

## 2021-12-01 NOTE — Progress Notes (Signed)
Brief Nutrition Follow-up:  RD received consult for TF with plan for Cortrak placement today.   Cortrak team attempted to place Cortrak tube with RN present but pt resisting placement, pulled out tube during placement. MD aware and ok for no Cortrak tube for now.   Pt remains on FL diet, po intake remains minimal, recorded po intake 5% of meals yesterday. Recorded po 0-20% since admission. Pt continues to require lots of cues during meal times with regards to swallowing. Pt also complains that he is full even when he has not eaten anything.  Noted ongoing GOC discussions. Pt remains Full Code currently, palliative care following. Family planning to meet tomorrow 8/5 to discuss code status and plan of care.   If aggressive nutrition is desired, pt will likely require nutrition support, preferably via Enteral Nutrition but will require enteral access first, Cortrak/NG vs PEG. RD to continue to follow  Romelle Starcher MS, RDN, LDN, CNSC Registered Dietitian 3 Clinical Nutrition RD Pager and On-Call Pager Number Located in [place]

## 2021-12-01 NOTE — TOC Progression Note (Signed)
Transition of Care [facility]) - Progression Note    Patient Details  Name: DEMETRES PROCHNOW MRN: [id number] Date of Birth: 1950/11/29  Transition of Care [facility]) CM/SW Contact  Delilah Shan, LCSWA Phone Number: 12/01/2021, 1:31 PM  Clinical Narrative:     CSW spoke with patients sister Eber Jones regarding SNF bed offers. Patients sister chose [facility] for SNF placement for patient. CSW called Tammy with [facility] who confirmed SNF bed for patient when medically ready for dc. CSW following to start insurance authorization close to patient being medically ready for dc.  Expected Discharge Plan: Skilled Nursing Facility Barriers to Discharge: SNF Pending bed offer  Expected Discharge Plan and Services Expected Discharge Plan: Skilled Nursing Facility     Post Acute Care Choice: Skilled Nursing Facility Living arrangements for the past 2 months: Single Family Home                                       Social Determinants of Health (SDOH) Interventions    Readmission Risk Interventions     No data to display

## 2021-12-02 DIAGNOSIS — I63412 Cerebral infarction due to embolism of left middle cerebral artery: Secondary | ICD-10-CM | POA: Diagnosis not present

## 2021-12-02 DIAGNOSIS — R570 Cardiogenic shock: Secondary | ICD-10-CM | POA: Diagnosis not present

## 2021-12-02 DIAGNOSIS — N179 Acute kidney failure, unspecified: Secondary | ICD-10-CM | POA: Diagnosis not present

## 2021-12-02 LAB — BASIC METABOLIC PANEL
Anion gap: 16 — ABNORMAL HIGH (ref 5–15)
BUN: 75 mg/dL — ABNORMAL HIGH (ref 8–23)
CO2: 24 mmol/L (ref 22–32)
Calcium: 8.6 mg/dL — ABNORMAL LOW (ref 8.9–10.3)
Chloride: 89 mmol/L — ABNORMAL LOW (ref 98–111)
Creatinine, Ser: 4.87 mg/dL — ABNORMAL HIGH (ref 0.61–1.24)
GFR, Estimated: 12 mL/min — ABNORMAL LOW (ref >60)
Glucose, Bld: 79 mg/dL (ref 70–99)
Potassium: 3.2 mmol/L — ABNORMAL LOW (ref 3.5–5.1)
Sodium: 129 mmol/L — ABNORMAL LOW (ref 135–145)

## 2021-12-02 LAB — COOXEMETRY PANEL
Carboxyhemoglobin: 1.2 % (ref 0.5–1.5)
Carboxyhemoglobin: 1.7 % — ABNORMAL HIGH (ref 0.5–1.5)
Methemoglobin: <0.7 % (ref 0.0–1.5)
Methemoglobin: <0.7 % (ref 0.0–1.5)
O2 Saturation: 44.2 %
O2 Saturation: 57.1 %
Total hemoglobin: 13.4 g/dL (ref 12.0–16.0)
Total hemoglobin: 12.8 g/dL (ref 12.0–16.0)

## 2021-12-02 LAB — CBC
HCT: 38.3 % — ABNORMAL LOW (ref 39.0–52.0)
Hemoglobin: 12.8 g/dL — ABNORMAL LOW (ref 13.0–17.0)
MCH: 28.8 pg (ref 26.0–34.0)
MCHC: 33.4 g/dL (ref 30.0–36.0)
MCV: 86.1 fL (ref 80.0–100.0)
Platelets: 192 10*3/uL (ref 150–400)
RBC: 4.45 MIL/uL (ref 4.22–5.81)
RDW: 14 % (ref 11.5–15.5)
WBC: 7.6 10*3/uL (ref 4.0–10.5)
nRBC: 0.3 % — ABNORMAL HIGH (ref 0.0–0.2)

## 2021-12-02 LAB — GLUCOSE, CAPILLARY
Glucose-Capillary: 110 mg/dL — ABNORMAL HIGH (ref 70–99)
Glucose-Capillary: 107 mg/dL — ABNORMAL HIGH (ref 70–99)
Glucose-Capillary: 133 mg/dL — ABNORMAL HIGH (ref 70–99)
Glucose-Capillary: 115 mg/dL — ABNORMAL HIGH (ref 70–99)
Glucose-Capillary: 106 mg/dL — ABNORMAL HIGH (ref 70–99)

## 2021-12-02 MED ORDER — POTASSIUM CHLORIDE 10 MEQ/100ML IV SOLN: 10.0000 meq | INTRAVENOUS | Status: DC

## 2021-12-02 MED ORDER — POTASSIUM CHLORIDE 20 MEQ PO PACK
40.0000 meq | PACK | Freq: Once | ORAL | Status: AC
  Administered 2021-12-02: 40 meq via ORAL
  Filled 2021-12-02: qty 2

## 2021-12-02 MED ORDER — ALUM & MAG HYDROXIDE-SIMETH 200-200-20 MG/5ML PO SUSP
15.0000 mL | Freq: Four times a day (QID) | ORAL | Status: DC | PRN
Start: 2021-12-02 — End: 2021-12-04
  Administered 2021-12-02 – 2021-12-04 (×4): 15 mL via ORAL
  Filled 2021-12-02 (×6): qty 30

## 2021-12-02 MED ORDER — NOREPINEPHRINE 4 MG/250ML-% IV SOLN
5.0000 ug/min | INTRAVENOUS | Status: DC
  Administered 2021-12-02 (×2): 5 ug/min via INTRAVENOUS
  Filled 2021-12-02 (×2): qty 250

## 2021-12-02 MED ORDER — POTASSIUM CHLORIDE 20 MEQ PO PACK: 20.0000 meq | PACK | ORAL | Status: DC

## 2021-12-02 NOTE — Progress Notes (Signed)
Patient ID: Steve Salazar, male   DOB: Sep 21, 1950, 71 y.o.   MRN: [id number]     Advanced Heart Failure Rounding Note  PCP-Cardiologist: Ida Rogue, MD   Subjective:    7/31: Milrinone d/c 2/2 hypotension. Transitioned to DBA 5 + Lasix gtt at 15/hr. Midodrine added.  8/1: Co-ox 48%, DBA increased to 7.5. Metolazone added to augment diuresis  8/2: Lasix gtt increased to 20/hr + 2.5 metolazone  8/3: NE weaned off. Lasix gtt off. Started PO torsemide.   CO-OX 44% on dobutamine 7.5, off NE.  On midodrine 10 tid. SBP 80s-90s.  Unable to obtain CVP this morning on his single lumen PICC (has been difficult).   He is off diuretics.  Creatinine 4.45 => 4.87.    No dyspnea.  Not eating well, currently on full liquid diet. Still with expressive aphasia but seems less confused today.    Objective:   Weight Range: 87.8 kg Body mass index is 26.25 kg/m.   Vital Signs:   Temp:  [96.7 F (35.9 C)-99 F (37.2 C)] 96.7 F (35.9 C) (08/05 0700) Pulse Rate:  [77-170] 87 (08/05 0800) Resp:  [0-33] 12 (08/05 0800) BP: (87-142)/(64-102) 91/72 (08/05 0800) SpO2:  [88 %-98 %] 96 % (08/05 0800) Weight:  [87.8 kg] 87.8 kg (08/05 0500) Last BM Date : 12/01/21  Weight change: Filed Weights   11/30/21 0426 12/01/21 0500 12/02/21 0500  Weight: 86.8 kg 87.1 kg 87.8 kg    Intake/Output:   Intake/Output Summary (Last 24 hours) at 12/02/2021 0844 Last data filed at 12/02/2021 0600 Gross per 24 hour  Intake 213.06 ml  Output 300 ml  Net -86.94 ml      Physical Exam    General: NAD Neck: JVP 10 cm, no thyromegaly or thyroid nodule.  Lungs: Clear to auscultation bilaterally with normal respiratory effort. CV: Nondisplaced PMI.  Heart regular S1/S2, no S3/S4, no murmur.  No peripheral edema.  No carotid bruit.  Normal pedal pulses.  Abdomen: Soft, nontender, no hepatosplenomegaly, no distention.  Skin: Intact without lesions or rashes.  Neurologic: Alert and oriented x 3.  Psych: Normal  affect. Extremities: No clubbing or cyanosis.  HEENT: Normal.   Telemetry   Atrial fibrillation with BiV pacing, 90s, rare PVCs (personally reviewed)  Labs    CBC Recent Labs    12/01/21 0340 12/02/21 0527  WBC 6.9 7.6  HGB 12.6* 12.8*  HCT 36.7* 38.3*  MCV 85.2 86.1  PLT 171 [id number]   Basic Metabolic Panel Recent Labs    11/30/21 0355 11/30/21 1619 12/01/21 0340 12/02/21 0527  NA 127*   < > 127* 129*  K 3.5   < > 3.5 3.2*  CL 92*   < > 90* 89*  CO2 24   < > 23 24  GLUCOSE 104*   < > 100* 79  BUN 55*   < > 66* 75*  CREATININE 3.62*   < > 4.45* 4.87*  CALCIUM 8.5*   < > 8.7* 8.6*  MG 2.4  --  2.5*  --   PHOS 6.5*  --   --   --    < > = values in this interval not displayed.   Liver Function Tests Recent Labs    11/30/21 0355  AST 44*  ALT 73*  ALKPHOS 58  BILITOT 2.5*  PROT 5.7*  ALBUMIN 2.9*   No results for input(s): "LIPASE", "AMYLASE" in the last 72 hours. Cardiac Enzymes No results for input(s): "CKTOTAL", "CKMB", "CKMBINDEX", "TROPONINI"  in the last 72 hours.  BNP: BNP (last 3 results) No results for input(s): "BNP" in the last 8760 hours.  ProBNP (last 3 results) No results for input(s): "PROBNP" in the last 8760 hours.   D-Dimer No results for input(s): "DDIMER" in the last 72 hours. Hemoglobin A1C No results for input(s): "HGBA1C" in the last 72 hours.  Fasting Lipid Panel No results for input(s): "CHOL", "HDL", "LDLCALC", "TRIG", "CHOLHDL", "LDLDIRECT" in the last 72 hours.  Thyroid Function Tests No results for input(s): "TSH", "T4TOTAL", "T3FREE", "THYROIDAB" in the last 72 hours.  Invalid input(s): "FREET3"  Other results:   Imaging    No results found.   Medications:     Scheduled Medications:  amiodarone  200 mg Oral BID   apixaban  5 mg Oral BID   Chlorhexidine Gluconate Cloth  6 each Topical Daily   feeding supplement  1 Container Oral TID BM   insulin aspart  0-15 Units Subcutaneous Q4H   midodrine  10 mg Oral  Q8H   oxybutynin  5 mg Oral TID   potassium chloride  40 mEq Oral Once   simethicone  80 mg Oral QID    Infusions:  sodium chloride     sodium chloride     sodium chloride     sodium chloride     DOBUTamine 7.5 mcg/kg/min (12/02/21 0500)   norepinephrine (LEVOPHED) Adult infusion      PRN Medications: Place/Maintain arterial line **AND** sodium chloride, Place/Maintain arterial line **AND** sodium chloride, acetaminophen **OR** acetaminophen (TYLENOL) oral liquid 160 mg/5 mL **OR** acetaminophen, mouth rinse, senna-docusate    Assessment/Plan   1. CVA: Acute left MCA CVA, likely cardioembolic.  Has dilated cardiomyopathy, also chronic atrial fibrillation. Echo did not show LV thrombus.  He is now s/p thrombectomy with residual small SAH. Per family, he probably was not taking his Eliquis at home.  C/w expressive aphasia, also aspiration risk (on full liquids currently).  - Heparin switched to Eliquis (not a drug failure as he apparently was not taking it). 2. Atrial fibrillation: Chronic, s/p AV nodal ablation.  Had successful DCCV in 6/23 but now back in AF.  - Eliquis 5 mg bid   3. AKI on CKD stage 3: Baseline creatinine 1.8-2 range, suspect cardiorenal syndrome. Creatinine up to 4.87 today. UOP 300 cc yesterday. Not markedly volume overloaded.  - Continue inotropic support but suspect we are nearing palliative situation w/ end-stage HF. Not candidate for HD  4. 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  Last cath in 12/21 with normal coronaries.  Echo this admission with EF 20%, severe LV dilation, severe biatrial enlargement, moderate RV enlargement with mildly decreased systolic function, mod-severe MR, moderate TR, dilated IVC.  Has [employer] CRT-D device.  He has known low output/end stage HF, was started on milrinone gtt 0.2 at [facility] in 12/22.  He so far has refused LVAD.  He had been off milrinone initially after admission, co-ox was 40% with AKI.   Milrinone restarted 7/29 at 0.375 and NE added to maintain MAP (had been on vasopressin). Milrinone later discontinued due to soft BPs. Now on DBA 7.5. NE weaned off. CO-OX down to 44% today. UOP remains sluggish. Diuretics stopped with worsening AKI, creatinine up to 4.87 today.  Not markedly volume overloaded though unable to measure CVP today (JVP around 10).  - Continue current dobutamine 7.5, with low co-ox and soft BP will add back norepinephrine 5 and repeat  co-ox.  - No diuretic today.  - There is consideration in notes about barostimulator placement, however not sure this would be very helpful with such advanced HF (inotrope-dependent).  - Patient has refused LVAD in the past, now with large left MCA CVA.  He is not currently an LVAD candidate with the acute CVA (will need to see functional recovery), and given apparent lack of compliance with apixaban would be concerned about his compliance with warfarin if an LVAD were implanted. Now with rising creatinine and decreasing UOP we are heading in the wrong direction.  Palliative care is following. Family meeting planned today. Appears to be moving towards a palliative/hospice situation given MSOF. Currently full code. DNR would be appropriate. 5. Mitral regurgitation: Moderate-severe functional MR.  LVESD > 7 cm, would not be Mitraclip candidate.  6. PVCs/NSVT: less frequent overnight. Continue amiodarone po.  - keep K > 4.0 and Mg > 2.0    Long-term prognosis remains poor, would not be HD candidate and not LVAD candidate.  Our hope at this point would be renal recovery then home on dobutamine gtt. Family discussing GOC.   CRITICAL CARE Performed by: Marca Ancona  Total critical care time: 40 minutes  Critical care time was exclusive of separately billable procedures and treating other patients.  Critical care was necessary to treat or prevent imminent or life-threatening deterioration.  Critical care was time spent personally by me on  the following activities: development of treatment plan with patient and/or surrogate as well as nursing, discussions with consultants, evaluation of patient's response to treatment, examination of patient, obtaining history from patient or surrogate, ordering and performing treatments and interventions, ordering and review of laboratory studies, ordering and review of radiographic studies, pulse oximetry and re-evaluation of patient's condition.  Marca Ancona. 12/02/2021 8:44 AM

## 2021-12-02 NOTE — Progress Notes (Signed)
NAME:  Steve Salazar, MRN:  [id number], DOB:  08/14/1950, LOS: 9 ADMISSION DATE:  11/10/2021, CONSULTATION DATE: 11/08/2021 REFERRING MD: Dr. 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  Patient remained hypotensive, required initiation of vasopressor, PCCM was consulted for help evaluation and medical management  Pertinent  Medical History   Past Medical History:  Diagnosis Date   CKD (chronic kidney disease), stage II    COVID-19 virus infection 03/2019   HFrEF (heart failure with reduced ejection fraction) (HCC)    a. 2011 Echo: EF 25-30%; b. 10/2016 Echo: EF 20-25%; c. 11/2019 Echo: EF 20-25%, mild LVH, Gr2 DD, sev dil LA, mod dil RA, mod MR, mild to mod AI, mod PR, Asc Ao 26mm.   Hypertension    NICM (nonischemic cardiomyopathy) (HCC)    a. 2011 Echo: EF 25-30%; b. 2011 MV: basal to dist inf perfusion defect->atten; c. 10/2016 Echo: EF 20-25%; d. 11/2019 Echo: EF 20-25%; e. 11/2019 MV: No ischemia. Fixed inflat defect, inflat HK, EF 17%; f. 03/2020 Cath: Nl cors. CO/CI 4.07/1.92.   PSVT (paroxysmal supraventricular tachycardia) (HCC)    a. 12/2016 Event monitor: rare, short episodes of SVT.    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 Renal function and LFTs both elevated   Interim History / Subjective:   Remains on dobutamine 7.5, on Coox 44%  Off levophed  Serum Cr continue to trended up with decresed urine output  Objective   Blood pressure 91/72, pulse 87, temperature (!) 96.7 F (35.9 C), temperature source Axillary, resp. rate 12, height 6' (1.829 m), weight 87.8 kg, SpO2 96 %. CVP:  [10 mmHg-26 mmHg] 10 mmHg      Intake/Output Summary (Last 24 hours) at 12/02/2021 5277 Last data filed at 12/02/2021 0600 Gross per 24 hour  Intake 203.58 ml  Output 300 ml  Net -96.42 ml   Filed Weights   11/30/21 0426 12/01/21 0500 12/02/21 0500  Weight: 86.8 kg 87.1 kg 87.8 kg    Examination: Physical exam: General: Acute on chronically ill-appearing male, sitting on recliner HEENT: [place]/AT, eyes anicteric.  moist mucus membranes Neuro: Alert, awake following commands.  Receptive and Expressive aphasia, antigravity in all 4 extremities Chest: Coarse breath sounds, no wheezes or rhonchi Heart: Irregularly irregular, no murmurs or gallops Abdomen: Soft, nontender, nondistended, bowel sounds present Skin: No rash  Serum creatinine trended up to 4.8  Resolved Hospital Problem list     Assessment & Plan:  Acute left MCA stroke s/p thrombectomy Etiology cardioembolic in the setting of afib one eliquis and cardiomyopathy with low EF.  Residual deficits include aphasia and slight R sided weakness Continue secondary stroke prophylaxis Stroke team is following Echo was negative for LV thrombus  Postprocedure small subarachnoid hemorrhage-MRI confirmed left MCA territory stroke with small amount of subarachnoid hemorrhage; improved on f/u imaging Continue neuro watch Neurology is following  Acute on chronic systolic CHF with cardiogenic shock Shock liver Appreciate heart failure  team follow-up Remain on dobutamine at 7.5, Co-ox further dropped to 44%   Urine output remained low at in last 24 hours His CVP is 8, diuretics were stopped per discussion with Heart failure team LFTs  stable  Chronic atrial fibrillation Heart rate remain well controlled Continue amiodarone Continue Eliquis for secondary stroke prophylaxis  AKI on CKD stage III a due to cardiorenal syndrome- worsened by dye load Frequent ectopy Serum creatinine continue to rise, his urine output is decreasing now  Monitor serum creatinine Nephrology consulted Avoid nephrotoxic agents Monitor intake and output  Hypovolemic hyponatremia Sodium is at 129 Closely monitor serum sodium  Dysphagia due to stroke Patient refused cortrak after multiple attempts He is on clear liquid diet with thickened liquid  Best Practice (right click and "Reselect all SmartList Selections" daily)   Diet/type: Dysphagia diet DVT prophylaxis: DOAC GI prophylaxis: N/A Lines: yes and it is still needed as port Foley:  Yes, and it is still needed Code Status:  full code Last date of multidisciplinary goals of care discussion: 7/29 see IPAL note.  Patient's wife was updated 8/5 at bedside   Total critical care time: 32 minutes  Performed by: Cheri Fowler   Critical care time was exclusive of separately billable procedures and treating other patients.   Critical care was necessary to treat or prevent imminent or life-threatening deterioration.   Critical care was time spent personally by me on the following activities: development of treatment plan with patient and/or surrogate as well as nursing, discussions with consultants, evaluation of patient's response to treatment, examination of patient, obtaining history from patient or surrogate, ordering and performing treatments and interventions, ordering and review of laboratory studies, ordering and review of radiographic studies, pulse oximetry and re-evaluation of patient's condition.   Cheri Fowler, MD [facility] See Amion for pager If no response to pager, please call [phone] until 7pm After 7pm, Please call E-link [phone]

## 2021-12-02 NOTE — Consult Note (Signed)
Reason for Consult: Renal failure Referring Physician:  Dr. Tacy Learn  Chief Complaint: Acute aphasia  Assessment/Plan: Acute Kidney injury on CKD3a with BL cr ~1.9-2.2, cr was in the 1.1-1.2 range in early 2022 but he likely has had progression of CKD associated with CRS. He is +3L net during this hospitalization.  COOX 57, CVP today is 10 - Urine microscopy not suggestive of CIN from contrast;  worsening renal function more consistent with CRS.   - Patient not a candidate for iHD and he has some volume onboard but not a large amount that CVVHD would be needed. In addition the patient is already on inotropic therapy at home.  - Agree with palliative family meeting today and not convinced RRT even with CRRT would be beneficial.  - Will follow peripherally in case there are any changes.   HFrEF NICM has refused LVAD and per cardiology not a candidate at this time with the recent CVA.  Atrial fibrillation h/o CV but currently in afib.  Left MCA CVA s/p intervention on Eliquis.    HPI: Steve Salazar is an 71 y.o. male NICM, HFrEF w/ EF15% 07/2021, PSVT, BiV ICD (04/2020)and CV or atrial flutter on Eliquis, HTN presenting with sudden onset speech difficulty at the nursing home. Patient noted to be aphasic with no motor deficit and CT head showed no acute changes. CTA (7/28) head and neck showed left M2 occlusion. Patient was noted to have worsening NIH score upon transfer and had an emergent thrombolysis (7/28) of the anterior circulation and thrombectomy of the left distal M2 occlusion with small thrombus formation in the inferior division of the MCA with small subarachnoid bleed. Patient with also hypotensive episodes for a few hours morning of 7/29, 8/2-8/3 evening with BP dropping to the 80's/60-70's range.  Milrinone transitioned to Dobutamine, Lasix gtt, Midodrine and later Metolazone added as well before Lasix changed to PO torsemide. Currently on Dobutamine 7.5 and Levophed 5 with CVP 10. Cr has  risen from 1.98 on 7/28 gradually to 4.9 at time of consultation.   ROS Pertinent items are noted in HPI.  Chemistry and CBC: Creatinine  Date/Time Value Ref Range Status  07/27/2014 07:29 AM 1.31 (H) mg/dL Final    Comment:    0.61-1.24 NOTE: New Reference Range  07/06/14    Creatinine, Ser  Date/Time Value Ref Range Status  12/02/2021 05:27 AM 4.87 (H) 0.61 - 1.24 mg/dL Final  12/01/2021 03:40 AM 4.45 (H) 0.61 - 1.24 mg/dL Final  11/30/2021 04:19 PM 4.12 (H) 0.61 - 1.24 mg/dL Final  11/30/2021 03:55 AM 3.62 (H) 0.61 - 1.24 mg/dL Final  11/29/2021 04:12 AM 3.34 (H) 0.61 - 1.24 mg/dL Final  11/28/2021 03:21 AM 3.20 (H) 0.61 - 1.24 mg/dL Final  11/27/2021 04:33 AM 3.28 (H) 0.61 - 1.24 mg/dL Final  11/26/2021 03:20 AM 3.54 (H) 0.61 - 1.24 mg/dL Final  11/25/2021 02:29 AM 2.88 (H) 0.61 - 1.24 mg/dL Final  10/31/2021 06:15 PM 2.51 (H) 0.61 - 1.24 mg/dL Final  11/17/2021 06:39 AM 1.98 (H) 0.61 - 1.24 mg/dL Final  10/31/2021 04:17 PM 2.10 (H) 0.61 - 1.24 mg/dL Final  08/23/2020 03:22 PM 1.19 0.76 - 1.27 mg/dL Final  05/13/2020 06:25 AM 1.19 0.61 - 1.24 mg/dL Final  05/11/2020 09:35 AM 1.11 0.61 - 1.24 mg/dL Final  03/14/2020 03:15 PM 1.14 0.76 - 1.27 mg/dL Final  02/15/2020 03:12 PM 1.25 0.76 - 1.27 mg/dL Final  01/12/2020 05:18 PM 1.32 (H) 0.61 - 1.24 mg/dL Final  06/15/2019 04:55  PM 1.21 0.61 - 1.24 mg/dL Final  06/01/2019 02:16 PM 1.32 (H) 0.76 - 1.27 mg/dL Final  05/18/2019 04:53 PM 1.29 (H) 0.61 - 1.24 mg/dL Final  11/05/2016 06:37 PM 1.68 (H) 0.61 - 1.24 mg/dL Final   Recent Labs  Lab 11/26/21 0320 11/27/21 0433 11/28/21 0321 11/29/21 0412 11/30/21 0355 11/30/21 1619 12/01/21 0340 12/02/21 0527  NA 133* 130* 127* 129* 127* 129* 127* 129*  K 4.1 3.7 4.3 3.7 3.5 3.6 3.5 3.2*  CL 97* 96* 95* 94* 92* 91* 90* 89*  CO2 23 24 19* _0 GLUCOSE 136* 112* 98 103* 104* 95 100* 79  BUN 45* 47* 49* 51* 55* 66* 66* 75*  CREATININE 3.54* 3.28* 3.20* 3.34* 3.62*  4.12* 4.45* 4.87*  CALCIUM 8.5* 8.4* 8.4* 8.5* 8.5* 8.8* 8.7* 8.6*  PHOS 5.5* 5.4* 6.3* 6.4* 6.5*  --   --   --    Recent Labs  Lab 11/29/21 0412 11/30/21 0355 12/01/21 0340 12/02/21 0527  WBC 7.1 8.2 6.9 7.6  HGB 13.4 13.6 12.6* 12.8*  HCT 39.4 39.6 36.7* 38.3*  MCV 86.2 86.3 85.2 86.1  PLT 169 179 171 192   Liver Function Tests: Recent Labs  Lab 11/28/21 0321 11/29/21 0412 11/30/21 0355  AST 78* 52* 44*  ALT 72* 78* 73*  ALKPHOS 58 57 58  BILITOT 2.1* 2.4* 2.5*  PROT 5.6* 5.5* 5.7*  ALBUMIN 3.0* 2.9* 2.9*   No results for input(s): "LIPASE", "AMYLASE" in the last 168 hours. No results for input(s): "AMMONIA" in the last 168 hours. Cardiac Enzymes: No results for input(s): "CKTOTAL", "CKMB", "CKMBINDEX", "TROPONINI" in the last 168 hours. Iron Studies: No results for input(s): "IRON", "TIBC", "TRANSFERRIN", "FERRITIN" in the last 72 hours. PT/INR: _1 (inr:5)  Xrays/Other Studies: ) Results for orders placed or performed during the hospital encounter of 11/25/2021 (from the past 48 hour(s))  Glucose, capillary     Status: Abnormal   Collection Time: 11/30/21  3:54 PM  Result Value Ref Range   Glucose-Capillary 103 (H) 70 - 99 mg/dL    Comment: Glucose reference range applies only to samples taken after fasting for at least 8 hours.  Cooxemetry Panel (carboxy, met, total hgb, O2 sat)     Status: None   Collection Time: 11/30/21  4:19 PM  Result Value Ref Range   Total hemoglobin 13.6 12.0 - 16.0 g/dL   O2 Saturation 59.2 %   Carboxyhemoglobin 1.4 0.5 - 1.5 %   Methemoglobin 0.7 0.0 - 1.5 %    Comment: Performed at [facility] [address] [place], [place] [address]  Basic metabolic panel     Status: Abnormal   Collection Time: 11/30/21  4:19 PM  Result Value Ref Range   Sodium 129 (L) 135 - 145 mmol/L   Potassium 3.6 3.5 - 5.1 mmol/L   Chloride 91 (L) 98 - 111 mmol/L   CO2 23 22 - 32 mmol/L   Glucose, Bld 95 70 - 99 mg/dL    Comment: Glucose  reference range applies only to samples taken after fasting for at least 8 hours.   BUN 66 (H) 8 - 23 mg/dL   Creatinine, Ser 4.12 (H) 0.61 - 1.24 mg/dL   Calcium 8.8 (L) 8.9 - 10.3 mg/dL   GFR, Estimated 15 (L) >60 mL/min    Comment: (NOTE) Calculated using the CKD-EPI Creatinine Equation (2021)    Anion gap 15 5 - 15    Comment: Performed at [facility], [place] [address], [place], [place] [address]  Glucose, capillary     Status: Abnormal   Collection Time: 11/30/21  7:29 PM  Result Value Ref Range   Glucose-Capillary 134 (H) 70 - 99 mg/dL    Comment: Glucose reference range applies only to samples taken after fasting for at least 8 hours.  Glucose, capillary     Status: Abnormal   Collection Time: 11/30/21 11:34 PM  Result Value Ref Range   Glucose-Capillary 102 (H) 70 - 99 mg/dL    Comment: Glucose reference range applies only to samples taken after fasting for at least 8 hours.  Glucose, capillary     Status: Abnormal   Collection Time: 12/01/21  3:33 AM  Result Value Ref Range   Glucose-Capillary 110 (H) 70 - 99 mg/dL    Comment: Glucose reference range applies only to samples taken after fasting for at least 8 hours.  CBC     Status: Abnormal   Collection Time: 12/01/21  3:40 AM  Result Value Ref Range   WBC 6.9 4.0 - 10.5 K/uL   RBC 4.31 4.22 - 5.81 MIL/uL   Hemoglobin 12.6 (L) 13.0 - 17.0 g/dL   HCT 36.7 (L) 39.0 - 52.0 %   MCV 85.2 80.0 - 100.0 fL   MCH 29.2 26.0 - 34.0 pg   MCHC 34.3 30.0 - 36.0 g/dL   RDW 13.9 11.5 - 15.5 %   Platelets 171 150 - 400 K/uL   nRBC 0.4 (H) 0.0 - 0.2 %    Comment: Performed at [facility] [address] [place], [place] [address]  Basic metabolic panel     Status: Abnormal   Collection Time: 12/01/21  3:40 AM  Result Value Ref Range   Sodium 127 (L) 135 - 145 mmol/L   Potassium 3.5 3.5 - 5.1 mmol/L   Chloride 90 (L) 98 - 111 mmol/L   CO2 23 22 - 32 mmol/L   Glucose, Bld 100 (H) 70 - 99 mg/dL    Comment: Glucose  reference range applies only to samples taken after fasting for at least 8 hours.   BUN 66 (H) 8 - 23 mg/dL   Creatinine, Ser 4.45 (H) 0.61 - 1.24 mg/dL   Calcium 8.7 (L) 8.9 - 10.3 mg/dL   GFR, Estimated 13 (L) >60 mL/min    Comment: (NOTE) Calculated using the CKD-EPI Creatinine Equation (2021)    Anion gap 14 5 - 15    Comment: Performed at [facility] [address], [place], [place] [address]  Magnesium     Status: Abnormal   Collection Time: 12/01/21  3:40 AM  Result Value Ref Range   Magnesium 2.5 (H) 1.7 - 2.4 mg/dL    Comment: Performed at [facility] [address] [place], [place] [address]  Cooxemetry Panel (carboxy, met, total hgb, O2 sat)     Status: None   Collection Time: 12/01/21  3:48 AM  Result Value Ref Range   Total hemoglobin 13.2 12.0 - 16.0 g/dL   O2 Saturation 62 %   Carboxyhemoglobin 1.4 0.5 - 1.5 %   Methemoglobin <0.7 0.0 - 1.5 %    Comment: Performed at [facility], [place] [address], [place], [place] [address]  Glucose, capillary     Status: Abnormal   Collection Time: 12/01/21  8:47 AM  Result Value Ref Range   Glucose-Capillary 121 (H) 70 - 99 mg/dL    Comment: Glucose  reference range applies only to samples taken after fasting for at least 8 hours.  Glucose, capillary     Status: Abnormal   Collection Time: 12/01/21 11:34 AM  Result Value Ref Range   Glucose-Capillary 103 (H) 70 - 99 mg/dL    Comment: Glucose reference range applies only to samples taken after fasting for at least 8 hours.  Glucose, capillary     Status: Abnormal   Collection Time: 12/01/21  4:05 PM  Result Value Ref Range   Glucose-Capillary 144 (H) 70 - 99 mg/dL    Comment: Glucose reference range applies only to samples taken after fasting for at least 8 hours.  Glucose, capillary     Status: None   Collection Time: 12/01/21  7:53 PM  Result Value Ref Range   Glucose-Capillary 98 70 - 99 mg/dL    Comment: Glucose reference range applies only to samples  taken after fasting for at least 8 hours.  Glucose, capillary     Status: Abnormal   Collection Time: 12/01/21 11:34 PM  Result Value Ref Range   Glucose-Capillary 146 (H) 70 - 99 mg/dL    Comment: Glucose reference range applies only to samples taken after fasting for at least 8 hours.  Glucose, capillary     Status: Abnormal   Collection Time: 12/02/21  3:14 AM  Result Value Ref Range   Glucose-Capillary 110 (H) 70 - 99 mg/dL    Comment: Glucose reference range applies only to samples taken after fasting for at least 8 hours.  Cooxemetry Panel (carboxy, met, total hgb, O2 sat)     Status: None   Collection Time: 12/02/21  5:27 AM  Result Value Ref Range   Total hemoglobin 13.4 12.0 - 16.0 g/dL   O2 Saturation 44.2 %   Carboxyhemoglobin 1.2 0.5 - 1.5 %   Methemoglobin <0.7 0.0 - 1.5 %    Comment: Performed at [facility], [place] [address], [place], [place] [address]  CBC     Status: Abnormal   Collection Time: 12/02/21  5:27 AM  Result Value Ref Range   WBC 7.6 4.0 - 10.5 K/uL   RBC 4.45 4.22 - 5.81 MIL/uL   Hemoglobin 12.8 (L) 13.0 - 17.0 g/dL   HCT 38.3 (L) 39.0 - 52.0 %   MCV 86.1 80.0 - 100.0 fL   MCH 28.8 26.0 - 34.0 pg   MCHC 33.4 30.0 - 36.0 g/dL   RDW 14.0 11.5 - 15.5 %   Platelets 192 150 - 400 K/uL   nRBC 0.3 (H) 0.0 - 0.2 %    Comment: Performed at [facility] [address], [place], [place] [address]  Basic metabolic panel     Status: Abnormal   Collection Time: 12/02/21  5:27 AM  Result Value Ref Range   Sodium 129 (L) 135 - 145 mmol/L   Potassium 3.2 (L) 3.5 - 5.1 mmol/L   Chloride 89 (L) 98 - 111 mmol/L   CO2 24 22 - 32 mmol/L   Glucose, Bld 79 70 - 99 mg/dL    Comment: Glucose reference range applies only to samples taken after fasting for at least 8 hours.   BUN 75 (H) 8 - 23 mg/dL   Creatinine, Ser 4.87 (H) 0.61 - 1.24 mg/dL   Calcium 8.6 (L) 8.9 - 10.3 mg/dL   GFR, Estimated 12 (L) >60 mL/min    Comment: (NOTE) Calculated using the  CKD-EPI Creatinine Equation (2021)    Anion gap 16 (  H) 5 - 15    Comment: Performed at [facility], [place] [address], [place], [place] [address]  Glucose, capillary     Status: Abnormal   Collection Time: 12/02/21  7:55 AM  Result Value Ref Range   Glucose-Capillary 107 (H) 70 - 99 mg/dL    Comment: Glucose reference range applies only to samples taken after fasting for at least 8 hours.   No results found.  PMH:   Past Medical History:  Diagnosis Date   CKD (chronic kidney disease), stage II    COVID-19 virus infection 03/2019   HFrEF (heart failure with reduced ejection fraction) (Ages)    a. 2011 Echo: EF 25-30%; b. 10/2016 Echo: EF 20-25%; c. 11/2019 Echo: EF 20-25%, mild LVH, Gr2 DD, sev dil LA, mod dil RA, mod MR, mild to mod AI, mod PR, Asc Ao 44m.   Hypertension    NICM (nonischemic cardiomyopathy) ([place]    a. 2011 Echo: EF 25-30%; b. 2011 MV: basal to dist inf perfusion defect->atten; c. 10/2016 Echo: EF 20-25%; d. 11/2019 Echo: EF 20-25%; e. 11/2019 MV: No ischemia. Fixed inflat defect, inflat HK, EF 17%; f. 03/2020 Cath: Nl cors. CO/CI 4.07/1.92.   PSVT (paroxysmal supraventricular tachycardia) ([facility]    a. 12/2016 Event monitor: rare, short episodes of SVT.    PSH:   Past Surgical History:  Procedure Laterality Date   BIV ICD INSERTION CRT-D N/A 05/13/2020   Procedure: BIV ICD INSERTION CRT-D;  Surgeon: LVickie Epley MD;  [facility];  Service: Cardiovascular;  Laterality: N/A;   CARDIOVERSION N/A 10/16/2021   Procedure: CARDIOVERSION;  Surgeon: AWellington Hampshire MD;  Location: [facility];  Service: Cardiovascular;  Laterality: N/A;   IR CT HEAD LTD  11/22/2021   IR CT HEAD LTD  11/13/2021   IR PERCUTANEOUS ART THROMBECTOMY/INFUSION INTRACRANIAL INC DIAG ANGIO  11/15/2021   IR [place] ACCESS RIGHT  11/18/2021   RADIOLOGY WITH ANESTHESIA N/A 11/14/2021   Procedure: IR WITH ANESTHESIA;  Surgeon: DLuanne Bras MD;  [facility]  Service:  Radiology;  Laterality: N/A;   RIGHT/LEFT HEART CATH AND CORONARY ANGIOGRAPHY Bilateral 04/01/2020   Procedure: RIGHT/LEFT HEART CATH AND CORONARY ANGIOGRAPHY;  Surgeon: GMinna Merritts MD;  [facility];  Service: Cardiovascular;  Laterality: Bilateral;    Allergies: No Known Allergies  Medications:   Prior to Admission medications   Medication Sig Start Date End Date Taking? Authorizing Provider  acetaminophen (TYLENOL) 500 MG tablet Take 1,000 mg by mouth every 6 (six) hours as needed for mild pain or moderate pain.   Yes [provider]  apixaban (ELIQUIS) 5 MG TABS tablet Take 5 mg by mouth 2 (two) times daily. 08/07/21 11/25/21 Yes [provider]  colchicine 0.6 MG tablet Take 0.6 mg by mouth daily as needed (For gout). 05/25/21  Yes [provider]  diclofenac Sodium (VOLTAREN) 1 % GEL Apply 2 g topically daily as needed (pain). 05/29/21 05/29/22 Yes [provider]  empagliflozin (JARDIANCE) 10 MG TABS tablet Take 1 tablet by mouth daily. 07/07/21  Yes [provider]  famotidine (PEPCID) 20 MG tablet Take 1 tablet (20 mg total) by mouth daily as needed for heartburn or indigestion. 10/09/21  Yes Gollan, TKathlene November MD  furosemide (LASIX) 40 MG tablet Take 40 mg by mouth daily as needed for fluid or edema. 07/04/21  Yes [provider]  losartan (COZAAR) 25 MG tablet Take 25 mg by  mouth daily. 06/30/21 06/30/22 Yes [provider]  magnesium oxide (MAG-OX) 400 MG tablet Take 400 mg by mouth daily. 04/26/21 04/26/22 Yes [provider]  Multiple Vitamins-Minerals (CENTRUM ADULTS PO) Take 1 tablet by mouth daily.   Yes [provider]  potassium chloride SA (KLOR-CON M) 20 MEQ tablet Take 20 mEq by mouth daily. 07/16/21  Yes [provider]  spironolactone (ALDACTONE) 25 MG tablet Take 25 mg by mouth daily. 06/30/21  Yes [provider]  [place] Name: Continuous IV Medication  for Heart. Site: Upper right arm.   Yes [provider]  predniSONE (DELTASONE) 20 MG tablet Take 20 mg by mouth daily as needed (Gout). Patient not taking: Reported on 11/25/2021 07/10/21 07/10/22  [provider]  tamsulosin (FLOMAX) 0.4 MG CAPS capsule Take 0.4 mg by mouth daily.     [provider]    Discontinued Meds:   Medications Discontinued During This Encounter  Medication Reason   clevidipine (CLEVIPREX) infusion 0.5 mg/mL Duplicate   phenylephrine (NEO-SYNEPHRINE) 70m/NS 2567mpremix infusion Duplicate   norepinephrine (LEVOPHED) 59m75mn 250m659m.016 mg/mL) premix infusion Duplicate   phenylephrine (NEO-SYNEPHRINE) 20mg31m250mL 80mix infusion    clevidipine (CLEVIPREX) infusion 0.5 mg/mL    enoxaparin (LOVENOX) injection 40 mg    norepinephrine (LEVOPHED) 59mg in35m0mL (035m mg/mL) premix infusion    milrinone (PRIMACOR) 20 MG/100 ML (0.2 mg/mL) infusion    vasopressin (PITRESSIN) 20 Units in sodium chloride 0.9 % 100 mL infusion-*FOR SHOCK*    norepinephrine (LEVOPHED) 59mg in 233mL (0.062mg/mL) premix infusion    dexmedetomidine (PRECEDEX) 400 MCG/100ML (4 mcg/mL) infusion    midodrine (PROAMATINE) tablet 5 mg    midodrine (PROAMATINE) tablet 5 mg    heparin ADULT infusion 100 units/mL (25000 units/250mL)    f38memide (LASIX) 200 mg in dextrose 5 % 100 mL (2 mg/mL) infusion    amiodarone (NEXTERONE PREMIX) 360-4.14 MG/200ML-% (1.8 mg/mL) IV infusion    ondansetron (ZOFRAN) injection 4 mg Completed Course   torsemide (DEMADEX) tablet 80 mg    potassium chloride (KLOR-CON) packet 20 mEq    potassium chloride 10 mEq in 100 mL IVPB    norepinephrine (LEVOPHED) 16 mg in 250mL premix59musion     Social History:  reports that he has never smoked. He has never used smokeless tobacco. He reports current alcohol use of about 2.0 standard drinks of alcohol per week. He reports that he does not use drugs.  Family History:   Family History  Problem  Relation Age of Onset   Heart attack Father        MI    Blood pressure 91/72, pulse 87, temperature (!) 96.7 F (35.9 C), temperature source Axillary, resp. rate 12, height 6' (1.829 m), weight 87.8 kg, SpO2 96 %. General appearance: alert, cooperative, and appears stated age Head: Normocephalic, without obvious abnormality, atraumatic Eyes: negative Back: symmetric, no curvature. ROM normal. No CVA tenderness. Resp: clear to auscultation bilaterally Cardio: regular rate and rhythm GI: soft, non-tender; bowel sounds normal; no masses,  no organomegaly Extremities: edema tr Pulses: 2+ and symmetric       Pennye Beeghly W, MD [facility] [place], 12:14 PM

## 2021-12-02 NOTE — Plan of Care (Signed)
Patient remains in CVICU overnight. Patient remains on infusions of Levophed and Dobutamine.    Problem: Education: Goal: Understanding of CV disease, CV risk reduction, and recovery process will improve Outcome: Not Progressing Goal: Individualized Educational Video(s) Outcome: Not Progressing   Problem: Activity: Goal: Ability to return to baseline activity level will improve Outcome: Not Progressing   Problem: Cardiovascular: Goal: Ability to achieve and maintain adequate cardiovascular perfusion will improve Outcome: Not Progressing Goal: Vascular access site(s) Level 0-1 will be maintained Outcome: Not Progressing   Problem: Health Behavior/Discharge Planning: Goal: Ability to safely manage health-related needs after discharge will improve Outcome: Not Progressing   Problem: Safety: Goal: Non-violent Restraint(s) Outcome: Not Progressing   Problem: Education: Goal: Ability to describe self-care measures that may prevent or decrease complications (Diabetes Survival Skills Education) will improve Outcome: Not Progressing Goal: Individualized Educational Video(s) Outcome: Not Progressing   Problem: Coping: Goal: Ability to adjust to condition or change in health will improve Outcome: Not Progressing   Problem: Fluid Volume: Goal: Ability to maintain a balanced intake and output will improve Outcome: Not Progressing   Problem: Health Behavior/Discharge Planning: Goal: Ability to identify and utilize available resources and services will improve Outcome: Not Progressing Goal: Ability to manage health-related needs will improve Outcome: Not Progressing   Problem: Metabolic: Goal: Ability to maintain appropriate glucose levels will improve Outcome: Not Progressing   Problem: Nutritional: Goal: Maintenance of adequate nutrition will improve Outcome: Not Progressing Goal: Progress toward achieving an optimal weight will improve Outcome: Not Progressing   Problem:  Skin Integrity: Goal: Risk for impaired skin integrity will decrease Outcome: Not Progressing   Problem: Tissue Perfusion: Goal: Adequacy of tissue perfusion will improve Outcome: Not Progressing   Problem: Education: Goal: Knowledge of General Education information will improve Description: Including pain rating scale, medication(s)/side effects and non-pharmacologic comfort measures Outcome: Not Progressing   Problem: Health Behavior/Discharge Planning: Goal: Ability to manage health-related needs will improve Outcome: Not Progressing   Problem: Clinical Measurements: Goal: Ability to maintain clinical measurements within normal limits will improve Outcome: Not Progressing Goal: Will remain free from infection Outcome: Not Progressing Goal: Diagnostic test results will improve Outcome: Not Progressing Goal: Respiratory complications will improve Outcome: Not Progressing Goal: Cardiovascular complication will be avoided Outcome: Not Progressing   Problem: Activity: Goal: Risk for activity intolerance will decrease Outcome: Not Progressing   Problem: Nutrition: Goal: Adequate nutrition will be maintained Outcome: Not Progressing   Problem: Coping: Goal: Level of anxiety will decrease Outcome: Not Progressing   Problem: Elimination: Goal: Will not experience complications related to bowel motility Outcome: Not Progressing Goal: Will not experience complications related to urinary retention Outcome: Not Progressing   Problem: Pain Managment: Goal: General experience of comfort will improve Outcome: Not Progressing   Problem: Safety: Goal: Ability to remain free from injury will improve Outcome: Not Progressing   Problem: Skin Integrity: Goal: Risk for impaired skin integrity will decrease Outcome: Not Progressing   Problem: Education: Goal: Knowledge of disease or condition will improve Outcome: Not Progressing Goal: Knowledge of secondary prevention will  improve (SELECT ALL) Outcome: Not Progressing Goal: Individualized Educational Video(s) Outcome: Not Progressing   Problem: Health Behavior/Discharge Planning: Goal: Ability to manage health-related needs will improve Outcome: Not Progressing   Problem: Self-Care: Goal: Ability to participate in self-care as condition permits will improve Outcome: Not Progressing Goal: Ability to communicate needs accurately will improve Outcome: Not Progressing   Problem: Nutrition: Goal: Risk of aspiration will decrease Outcome: Not Progressing  Goal: Dietary intake will improve Outcome: Not Progressing   Problem: Ischemic Stroke/TIA Tissue Perfusion: Goal: Complications of ischemic stroke/TIA will be minimized Outcome: Not Progressing   Problem: Spontaneous Subarachnoid Hemorrhage Tissue Perfusion: Goal: Complications of Spontaneous Subarachnoid Hemorrhage will be minimized Outcome: Not Progressing

## 2021-12-03 DIAGNOSIS — Z789 Other specified health status: Secondary | ICD-10-CM

## 2021-12-03 DIAGNOSIS — R570 Cardiogenic shock: Secondary | ICD-10-CM | POA: Diagnosis not present

## 2021-12-03 DIAGNOSIS — I63412 Cerebral infarction due to embolism of left middle cerebral artery: Secondary | ICD-10-CM | POA: Diagnosis not present

## 2021-12-03 DIAGNOSIS — Z515 Encounter for palliative care: Secondary | ICD-10-CM | POA: Diagnosis not present

## 2021-12-03 DIAGNOSIS — N179 Acute kidney failure, unspecified: Secondary | ICD-10-CM | POA: Diagnosis not present

## 2021-12-03 LAB — BASIC METABOLIC PANEL
Anion gap: 13 (ref 5–15)
BUN: 85 mg/dL — ABNORMAL HIGH (ref 8–23)
CO2: 24 mmol/L (ref 22–32)
Calcium: 8.5 mg/dL — ABNORMAL LOW (ref 8.9–10.3)
Chloride: 88 mmol/L — ABNORMAL LOW (ref 98–111)
Creatinine, Ser: 5.14 mg/dL — ABNORMAL HIGH (ref 0.61–1.24)
GFR, Estimated: 11 mL/min — ABNORMAL LOW (ref >60)
Glucose, Bld: 133 mg/dL — ABNORMAL HIGH (ref 70–99)
Potassium: 3.7 mmol/L (ref 3.5–5.1)
Sodium: 125 mmol/L — ABNORMAL LOW (ref 135–145)

## 2021-12-03 LAB — CBC
HCT: 37.1 % — ABNORMAL LOW (ref 39.0–52.0)
Hemoglobin: 12.5 g/dL — ABNORMAL LOW (ref 13.0–17.0)
MCH: 28.7 pg (ref 26.0–34.0)
MCHC: 33.7 g/dL (ref 30.0–36.0)
MCV: 85.3 fL (ref 80.0–100.0)
Platelets: 206 10*3/uL (ref 150–400)
RBC: 4.35 MIL/uL (ref 4.22–5.81)
RDW: 14 % (ref 11.5–15.5)
WBC: 8.5 10*3/uL (ref 4.0–10.5)
nRBC: 0.4 % — ABNORMAL HIGH (ref 0.0–0.2)

## 2021-12-03 LAB — GLUCOSE, CAPILLARY
Glucose-Capillary: 115 mg/dL — ABNORMAL HIGH (ref 70–99)
Glucose-Capillary: 116 mg/dL — ABNORMAL HIGH (ref 70–99)
Glucose-Capillary: 117 mg/dL — ABNORMAL HIGH (ref 70–99)
Glucose-Capillary: 121 mg/dL — ABNORMAL HIGH (ref 70–99)
Glucose-Capillary: 134 mg/dL — ABNORMAL HIGH (ref 70–99)
Glucose-Capillary: 149 mg/dL — ABNORMAL HIGH (ref 70–99)
Glucose-Capillary: 157 mg/dL — ABNORMAL HIGH (ref 70–99)

## 2021-12-03 LAB — COOXEMETRY PANEL
Carboxyhemoglobin: 1.9 % — ABNORMAL HIGH (ref 0.5–1.5)
Methemoglobin: <0.7 % (ref 0.0–1.5)
O2 Saturation: 61.6 %
Total hemoglobin: 13.2 g/dL (ref 12.0–16.0)

## 2021-12-03 LAB — MAGNESIUM: Magnesium: 2.5 mg/dL — ABNORMAL HIGH (ref 1.7–2.4)

## 2021-12-03 MED ORDER — FUROSEMIDE 10 MG/ML IJ SOLN
160.0000 mg | Freq: Two times a day (BID) | INTRAVENOUS | Status: AC
  Administered 2021-12-03 (×2): 160 mg via INTRAVENOUS
  Filled 2021-12-03: qty 10
  Filled 2021-12-03 (×2): qty 16

## 2021-12-03 MED ORDER — NOREPINEPHRINE 4 MG/250ML-% IV SOLN
8.0000 ug/min | INTRAVENOUS | Status: DC
Start: 2021-12-03 — End: 2021-12-04
  Administered 2021-12-03 – 2021-12-04 (×3): 8 ug/min via INTRAVENOUS
  Filled 2021-12-03 (×3): qty 250

## 2021-12-03 MED ORDER — POTASSIUM CHLORIDE 10 MEQ/50ML IV SOLN
10.0000 meq | INTRAVENOUS | Status: AC
  Administered 2021-12-03 (×2): 10 meq via INTRAVENOUS
  Filled 2021-12-03 (×2): qty 50

## 2021-12-03 MED ORDER — COLCHICINE 0.3 MG HALF TABLET
0.3000 mg | ORAL_TABLET | Freq: Every day | ORAL | Status: DC
  Administered 2021-12-03 – 2021-12-09 (×7): 0.3 mg via ORAL
  Filled 2021-12-03 (×7): qty 1

## 2021-12-03 NOTE — Progress Notes (Signed)
Patient ID: Steve Salazar, male   DOB: 02-15-1951, 71 y.o.   MRN: [id number]     Advanced Heart Failure Rounding Note  PCP-Cardiologist: Ida Rogue, MD   Subjective:    7/31: Milrinone d/c 2/2 hypotension. Transitioned to DBA 5 + Lasix gtt at 15/hr. Midodrine added.  8/1: Co-ox 48%, DBA increased to 7.5. Metolazone added to augment diuresis  8/2: Lasix gtt increased to 20/hr + 2.5 metolazone  8/3: NE weaned off. Lasix gtt off. Started PO torsemide.  8/5: NE restarted  CO-OX 62% on dobutamine 7.5, NE 5.  On midodrine 10 tid. SBP 80s-90s, still low at times.  CVP 21 this morning, per nurse rose steadily through day yesterday.   He is off diuretics.  Creatinine 4.45 => 4.87 => 5.14. UOP 980 cc for the day.   No dyspnea.  Not eating well, currently on full liquid diet. Still with expressive aphasia but not confused.    Objective:   Weight Range: 89.4 kg Body mass index is 26.73 kg/m.   Vital Signs:   Temp:  [97.3 F (36.3 C)-97.6 F (36.4 C)] 97.6 F (36.4 C) (08/06 0400) Pulse Rate:  [86-176] 91 (08/06 0600) Resp:  [0-33] 23 (08/06 0600) BP: (83-99)/(66-84) 93/73 (08/06 0600) SpO2:  [78 %-100 %] 98 % (08/06 0600) Weight:  [89.4 kg] 89.4 kg (08/06 0600) Last BM Date : 12/02/21  Weight change: Filed Weights   12/01/21 0500 12/02/21 0500 12/03/21 0600  Weight: 87.1 kg 87.8 kg 89.4 kg    Intake/Output:   Intake/Output Summary (Last 24 hours) at 12/03/2021 0809 Last data filed at 12/03/2021 0600 Gross per 24 hour  Intake 1082.57 ml  Output 980 ml  Net 102.57 ml      Physical Exam    General: NAD Neck: JVP 16 cm, no thyromegaly or thyroid nodule.  Lungs: Clear to auscultation bilaterally with normal respiratory effort. CV: Lateral PMI.  Heart regular S1/S2, no S3/S4, 2/6 HSM apex.  No peripheral edema.    Abdomen: Soft, nontender, no hepatosplenomegaly, no distention.  Skin: Intact without lesions or rashes.  Neurologic: Alert and oriented x 3.  Psych: Normal  affect. Extremities: No clubbing or cyanosis.  HEENT: Normal.   Telemetry   Atrial fibrillation with BiV pacing, 90s, rare PVCs (personally reviewed)  Labs    CBC Recent Labs    12/02/21 0527 12/03/21 0404  WBC 7.6 8.5  HGB 12.8* 12.5*  HCT 38.3* 37.1*  MCV 86.1 85.3  PLT 192 [id number]   Basic Metabolic Panel Recent Labs    12/01/21 0340 12/02/21 0527 12/03/21 0404  NA 127* 129* 125*  K 3.5 3.2* 3.7  CL 90* 89* 88*  CO2 23 24 24   GLUCOSE 100* 79 133*  BUN 66* 75* 85*  CREATININE 4.45* 4.87* 5.14*  CALCIUM 8.7* 8.6* 8.5*  MG 2.5*  --  2.5*   Liver Function Tests No results for input(s): "AST", "ALT", "ALKPHOS", "BILITOT", "PROT", "ALBUMIN" in the last 72 hours.  No results for input(s): "LIPASE", "AMYLASE" in the last 72 hours. Cardiac Enzymes No results for input(s): "CKTOTAL", "CKMB", "CKMBINDEX", "TROPONINI" in the last 72 hours.  BNP: BNP (last 3 results) No results for input(s): "BNP" in the last 8760 hours.  ProBNP (last 3 results) No results for input(s): "PROBNP" in the last 8760 hours.   D-Dimer No results for input(s): "DDIMER" in the last 72 hours. Hemoglobin A1C No results for input(s): "HGBA1C" in the last 72 hours.  Fasting Lipid Panel No results  for input(s): "CHOL", "HDL", "LDLCALC", "TRIG", "CHOLHDL", "LDLDIRECT" in the last 72 hours.  Thyroid Function Tests No results for input(s): "TSH", "T4TOTAL", "T3FREE", "THYROIDAB" in the last 72 hours.  Invalid input(s): "FREET3"  Other results:   Imaging    No results found.   Medications:     Scheduled Medications:  amiodarone  200 mg Oral BID   apixaban  5 mg Oral BID   Chlorhexidine Gluconate Cloth  6 each Topical Daily   feeding supplement  1 Container Oral TID BM   insulin aspart  0-15 Units Subcutaneous Q4H   midodrine  10 mg Oral Q8H   oxybutynin  5 mg Oral TID   simethicone  80 mg Oral QID    Infusions:  sodium chloride     sodium chloride Stopped (12/03/21 0550)    sodium chloride     sodium chloride     DOBUTamine 7.5 mcg/kg/min (12/03/21 0600)   furosemide     norepinephrine (LEVOPHED) Adult infusion 5 mcg/min (12/03/21 0600)   potassium chloride 10 mEq (12/03/21 0711)    PRN Medications: Place/Maintain arterial line **AND** sodium chloride, Place/Maintain arterial line **AND** sodium chloride, acetaminophen **OR** acetaminophen (TYLENOL) oral liquid 160 mg/5 mL **OR** acetaminophen, alum & mag hydroxide-simeth, mouth rinse, senna-docusate    Assessment/Plan   1. CVA: Acute left MCA CVA, likely cardioembolic.  Has dilated cardiomyopathy, also chronic atrial fibrillation. Echo did not show LV thrombus.  He is now s/p thrombectomy with residual small SAH. Per family, he probably was not taking his Eliquis at home.  C/w expressive aphasia, also aspiration risk (on full liquids currently).  - Heparin switched to Eliquis (not a drug failure as he apparently was not taking it). 2. Atrial fibrillation: Chronic, s/p AV nodal ablation.  Had successful DCCV in 6/23 but now back in AF.  - Eliquis 5 mg bid   3. AKI on CKD stage 3: Baseline creatinine 1.8-2 range, suspect cardiorenal syndrome with superimposed ATN from hypotension earlier in hospitalization and possible CIN contribution. Creatinine up to 5.14 today. UOP 980 cc yesterday. Significant volume overload developing, CVP 21. - Continue inotropic support but suspect we are nearing a palliative situation w/ end-stage HF. Not good candidate for long-term HD.   - Will try high dose IV Lasix today.  If this does not succeed, we could do CVVH but we do not have a good end point for this (poor iHD candidate with end-stage CHF).  4. 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  Last cath in 12/21 with normal coronaries.  Echo this admission with EF 20%, severe LV dilation, severe biatrial enlargement, moderate RV enlargement with mildly decreased systolic function, mod-severe MR,  moderate TR, dilated IVC.  Has [employer] Jude CRT-D device.  He has known low output/end stage HF, was started on milrinone gtt 0.2 at [facility] in 12/22.  He so far has refused LVAD.  He had been off milrinone initially after admission, co-ox was 40% with AKI.  Milrinone restarted 7/29 at 0.375 and NE added to maintain MAP (had been on vasopressin). Milrinone later discontinued due to soft BPs. Now on DBA 7.5 and NE 5 with co-ox 62%. Diuretics stopped with worsening AKI, creatinine up to 5.14 today.  He is significantly volume overloaded with CVP 21.  - Continue current dobutamine 7.5.  - With BP low at times, increase NE to 8.  - Will try to diurese today with rising creatinine, will give Lasix 160 mg  IV bid. If this is unsuccessful, only way to decongest would be CVVH, but as above, there does not appear to be an end point for this.  - There is consideration in notes about barostimulator placement, however do not think this would be helpful with such advanced HF (inotrope-dependent).  - Patient has refused LVAD in the past, now with large left MCA CVA.  He is not currently an LVAD candidate with the acute CVA (will need to see functional recovery), and given apparent lack of compliance with apixaban would be concerned about his compliance with warfarin if an LVAD were implanted. Now with rising creatinine, we are heading in the wrong direction.  Palliative care is following. Family had planned to meet to discuss code status among themselves.  Appears to be moving towards a palliative/hospice situation given MSOF. Currently full code. DNR would be appropriate. 5. Mitral regurgitation: Moderate-severe functional MR.  LVESD > 7 cm, would not be Mitraclip candidate.  6. PVCs/NSVT: Continue amiodarone po.  - keep K > 4.0 and Mg > 2.0    Long-term prognosis remains poor, would not be iHD candidate and not LVAD candidate.  Our hope at this point would be renal recovery then home on dobutamine gtt. Family discussing GOC,  have not heard results of this conversation yet.   CRITICAL CARE Performed by: Marca Ancona  Total critical care time: 40 minutes  Critical care time was exclusive of separately billable procedures and treating other patients.  Critical care was necessary to treat or prevent imminent or life-threatening deterioration.  Critical care was time spent personally by me on the following activities: development of treatment plan with patient and/or surrogate as well as nursing, discussions with consultants, evaluation of patient's response to treatment, examination of patient, obtaining history from patient or surrogate, ordering and performing treatments and interventions, ordering and review of laboratory studies, ordering and review of radiographic studies, pulse oximetry and re-evaluation of patient's condition.  Marca Ancona. 12/03/2021 8:09 AM

## 2021-12-03 NOTE — Progress Notes (Signed)
NAME:  Steve Salazar, MRN:  [id number], DOB:  March 03, 1951, LOS: 10 ADMISSION DATE:  Dec 06, 2021, CONSULTATION DATE: 10/28/2021 REFERRING MD: Dr. 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  Patient remained hypotensive, required initiation of vasopressor, PCCM was consulted for help evaluation and medical management  Pertinent  Medical History   Past Medical History:  Diagnosis Date   CKD (chronic kidney disease), stage II    COVID-19 virus infection 03/2019   HFrEF (heart failure with reduced ejection fraction) (HCC)    a. 2011 Echo: EF 25-30%; b. 10/2016 Echo: EF 20-25%; c. 11/2019 Echo: EF 20-25%, mild LVH, Gr2 DD, sev dil LA, mod dil RA, mod MR, mild to mod AI, mod PR, Asc Ao 32mm.   Hypertension    NICM (nonischemic cardiomyopathy) (HCC)    a. 2011 Echo: EF 25-30%; b. 2011 MV: basal to dist inf perfusion defect->atten; c. 10/2016 Echo: EF 20-25%; d. 11/2019 Echo: EF 20-25%; e. 11/2019 MV: No ischemia. Fixed inflat defect, inflat HK, EF 17%; f. 03/2020 Cath: Nl cors. CO/CI 4.07/1.92.   PSVT (paroxysmal supraventricular tachycardia) (HCC)    a. 12/2016 Event monitor: rare, short episodes of SVT.    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 Renal function and LFTs both elevated   Interim History / Subjective:   Remains on dobutamine 7.5, on Coox 62% Restarted back on Levophed, currently at 8 mics, on midodrine  Serum Cr continue to trended up with decresed urine output  Objective   Blood pressure (!) 74/63, pulse 89, temperature 97.6 F (36.4 C), temperature source Axillary, resp. rate (!) 30, height 6' (1.829 m), weight 89.4 kg, SpO2 97 %. CVP:  [13 mmHg-70 mmHg] 70 mmHg      Intake/Output Summary (Last 24 hours) at 12/03/2021 1202 Last data filed at 12/03/2021 1015 Gross per 24 hour  Intake 1612.35 ml  Output 980 ml  Net 632.35 ml   Filed Weights   12/01/21 0500 12/02/21 0500 12/03/21 0600  Weight: 87.1 kg 87.8 kg 89.4 kg    Examination: Physical exam: General: Acute on chronically ill-appearing male, sitting on recliner HEENT: Parc/AT, eyes anicteric.  moist mucus membranes Neuro: Alert, awake following commands.  Receptive and Expressive aphasia, antigravity in all 4 extremities Chest: Coarse breath sounds, no wheezes or rhonchi Heart: Irregularly irregular, no murmurs or gallops Abdomen: Soft, nontender, nondistended, bowel sounds present Skin: No rash  Serum creatinine trended up to 5.1  Resolved Hospital Problem list   Post thrombectomy subarachnoid hemorrhage  Assessment & Plan:  Acute left MCA stroke s/p thrombectomy Etiology cardioembolic in the setting of afib one eliquis and cardiomyopathy with low EF.  Residual deficits include aphasia and slight R sided weakness Continue secondary stroke prophylaxis  Acute on chronic systolic CHF with cardiogenic shock Shock liver Appreciate heart failure team follow-up Remain on dobutamine at 7.5, restarted back on Levophed, currently at 8 mics Co-ox 62%  Urine output remained low 980cc in last  24 hours CVP at 21 Diuretics were restarted back with Lasix 160 mg twice daily LFTs stable  Chronic atrial fibrillation Heart rate remain well controlled Continue amiodarone Continue Eliquis for secondary stroke prophylaxis  AKI on  CKD stage III a due to cardiorenal syndrome- worsened by dye load Frequent ectopy Serum creatinine continue to rise, his urine output is still low He remains volume overloaded  Monitor serum creatinine Nephrology consulted, appreciate their recommendation I agree patient is not a candidate for IHD considering he is on dobutamine and vasopressors Avoid nephrotoxic agents Monitor intake and output  Hypervolemic hyponatremia Sodium is at 125 Closely monitor serum sodium  Dysphagia due to stroke Patient refused cortrak after multiple attempts Continue dysphagia diet  Best Practice (right click and "Reselect all SmartList Selections" daily)   Diet/type: Dysphagia diet DVT prophylaxis: DOAC GI prophylaxis: N/A Lines: yes and it is still needed as port Foley: N/A Code Status:  full code Last date of multidisciplinary goals of care discussion: 7/29 see IPAL note.   8/6: Patient's significant other and sister were updated at bedside, explained that his kidney function started getting worse and he has severe heart failure.  Patient's family would like to continue aggressive care including CPR and intubation in case of cardiac or respiratory arrest    Total critical care time: 36 minutes  Performed by: Cheri Fowler   Critical care time was exclusive of separately billable procedures and treating other patients.   Critical care was necessary to treat or prevent imminent or life-threatening deterioration.   Critical care was time spent personally by me on the following activities: development of treatment plan with patient and/or surrogate as well as nursing, discussions with consultants, evaluation of patient's response to treatment, examination of patient, obtaining history from patient or surrogate, ordering and performing treatments and interventions, ordering and review of laboratory studies, ordering and review of radiographic studies, pulse oximetry and re-evaluation of patient's  condition.   Cheri Fowler, MD [facility] See Amion for pager If no response to pager, please call [phone] until 7pm After 7pm, Please call E-link [phone]

## 2021-12-03 NOTE — Progress Notes (Signed)
Palliative Care Progress Note, Assessment & Plan   Patient Name: Steve Salazar       Date: 12/03/2021 DOB: Sep 19, 1950  Age: 71 y.o. MRN#: [id number] Attending Physician: Jacky Kindle, MD Primary Care Physician: Marygrace Drought, MD Admit Date: 11/13/2021  Reason for Consultation/Follow-up: Establishing goals of care  Subjective: Patient is sitting in recliner.  He is in no apparent distress.  He acknowledges my presence and is able to make his wishes known.  His Sister Steve Salazar is at bedside.   HPI: 71 y.o. 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 He was admitted on 11/18/2021 with acute left MCA stroke status post thrombectomy, post procedure small subarachnoid hemorrhage, acute on chronic CHF with cardiogenic shock on vasopressors, AKI on CKD, metabolic encephalopathy, shock liver, and others. Palliative was consulted for goals of care conversations.  Summary of counseling/coordination of care: After reviewing the patient's chart and assessing the patient at bedside, I spoke with patient and his sister in regards to goals of care and CODE STATUS.    I attempted to elicit goals and values important to the patient.  Patient is unable to articulate his wishes at this time but made clear by nodding in agreement with his family's choices for him.    Steve Salazar shares that family met yesterday and has decided that patient will remain a full code.  Family is accepting of all offered,  available, and appropriate medical interventions to sustain patient's life.  Steve Salazar was clear that family understood patient was at end-stage heart failure and his kidney function is poor. Steve Salazar shares family is holding on to hope that patient will have some improvement.  Steve Salazar states she wanted to ensure family understood all aspects of patient's current health status and in doing so family still elected for family to remain full code/full scope.  Questions and concerns were addressed.  Palliative medicine team remains available to the patient/family.  Please contact PMT if patient/family requests, if goals change, or if patient's health declines.   Code Status: Full code  Prognosis: < 6 months  Discharge Planning: SNF  Physical Exam Vitals reviewed.  Constitutional:      Appearance: Normal appearance.  HENT:     Head: Normocephalic and atraumatic.     Mouth/Throat:     Mouth: Mucous membranes are moist.  Eyes:     Pupils: Pupils are equal, round, and reactive to light.  Cardiovascular:     Rate and Rhythm: Tachycardia present.     Pulses: Normal pulses.  Pulmonary:     Effort: Pulmonary effort is normal.  Abdominal:     Palpations: Abdomen is soft.  Musculoskeletal:     Comments: Generalized weakness  Skin:    General: Skin is warm and dry.  Neurological:     Mental Status: He is alert. Mental status is at baseline.     Comments: Expressive aphasia  Psychiatric:  Behavior: Behavior normal.             Palliative Assessment/Data: 60%    Total Time 35 minutes  Greater than 50%  of this time was spent counseling and coordinating care related to the above assessment and plan.  Thank you for allowing the Palliative Medicine Team to assist in the care of this patient.  Ladora Ilsa Iha, FNP-BC Palliative Medicine Team Team Phone # [phone]

## 2021-12-03 NOTE — Progress Notes (Signed)
eLink Physician-Brief Progress Note Patient Name: Steve Salazar DOB: [birth year] MRN: [id number]   Date of Service  12/03/2021  HPI/Events of Note  K+ 3.7.  eICU Interventions  KCL 10 meq iv Q 1 hour x 2 ordered.        Thomasene Lot Steve Salazar 12/03/2021, 5:24 AM

## 2021-12-04 DIAGNOSIS — I63412 Cerebral infarction due to embolism of left middle cerebral artery: Secondary | ICD-10-CM | POA: Diagnosis not present

## 2021-12-04 DIAGNOSIS — R570 Cardiogenic shock: Secondary | ICD-10-CM | POA: Diagnosis not present

## 2021-12-04 LAB — CBC
HCT: 37.8 % — ABNORMAL LOW (ref 39.0–52.0)
Hemoglobin: 12.8 g/dL — ABNORMAL LOW (ref 13.0–17.0)
MCH: 29 pg (ref 26.0–34.0)
MCHC: 33.9 g/dL (ref 30.0–36.0)
MCV: 85.7 fL (ref 80.0–100.0)
Platelets: 228 10*3/uL (ref 150–400)
RBC: 4.41 MIL/uL (ref 4.22–5.81)
RDW: 14.2 % (ref 11.5–15.5)
WBC: 10.1 10*3/uL (ref 4.0–10.5)
nRBC: 0.6 % — ABNORMAL HIGH (ref 0.0–0.2)

## 2021-12-04 LAB — BASIC METABOLIC PANEL
Anion gap: 11 (ref 5–15)
BUN: 93 mg/dL — ABNORMAL HIGH (ref 8–23)
CO2: 23 mmol/L (ref 22–32)
Calcium: 8.6 mg/dL — ABNORMAL LOW (ref 8.9–10.3)
Chloride: 89 mmol/L — ABNORMAL LOW (ref 98–111)
Creatinine, Ser: 5.32 mg/dL — ABNORMAL HIGH (ref 0.61–1.24)
GFR, Estimated: 11 mL/min — ABNORMAL LOW (ref >60)
Glucose, Bld: 104 mg/dL — ABNORMAL HIGH (ref 70–99)
Potassium: 4.2 mmol/L (ref 3.5–5.1)
Sodium: 123 mmol/L — ABNORMAL LOW (ref 135–145)

## 2021-12-04 LAB — COOXEMETRY PANEL
Carboxyhemoglobin: 2.2 % — ABNORMAL HIGH (ref 0.5–1.5)
Methemoglobin: <0.7 % (ref 0.0–1.5)
O2 Saturation: 60.4 %
Total hemoglobin: 12.9 g/dL (ref 12.0–16.0)

## 2021-12-04 LAB — GLUCOSE, CAPILLARY
Glucose-Capillary: 108 mg/dL — ABNORMAL HIGH (ref 70–99)
Glucose-Capillary: 127 mg/dL — ABNORMAL HIGH (ref 70–99)
Glucose-Capillary: 85 mg/dL (ref 70–99)
Glucose-Capillary: 91 mg/dL (ref 70–99)
Glucose-Capillary: 132 mg/dL — ABNORMAL HIGH (ref 70–99)

## 2021-12-04 LAB — SODIUM: Sodium: 125 mmol/L — ABNORMAL LOW (ref 135–145)

## 2021-12-04 MED ORDER — ALBUMIN HUMAN 25 % IV SOLN
12.5000 g | Freq: Once | INTRAVENOUS | Status: AC
Start: 2021-12-04 — End: 2021-12-04
  Administered 2021-12-04: 12.5 g via INTRAVENOUS
  Filled 2021-12-04: qty 50

## 2021-12-04 MED ORDER — NOREPINEPHRINE 4 MG/250ML-% IV SOLN
2.0000 ug/min | INTRAVENOUS | Status: DC
  Administered 2021-12-05: 2 ug/min via INTRAVENOUS
  Filled 2021-12-04: qty 250

## 2021-12-04 MED ORDER — ALUM & MAG HYDROXIDE-SIMETH 200-200-20 MG/5ML PO SUSP
15.0000 mL | ORAL | Status: DC | PRN
  Administered 2021-12-06 – 2021-12-07 (×3): 15 mL via ORAL
  Filled 2021-12-04 (×4): qty 30

## 2021-12-04 MED ORDER — METOLAZONE 5 MG PO TABS: 5.0000 mg | ORAL_TABLET | Freq: Once | ORAL | Status: DC

## 2021-12-04 MED ORDER — TOLVAPTAN 15 MG PO TABS
15.0000 mg | ORAL_TABLET | Freq: Once | ORAL | Status: AC
  Administered 2021-12-04: 15 mg via ORAL
  Filled 2021-12-04: qty 1

## 2021-12-04 MED ORDER — FUROSEMIDE 10 MG/ML IJ SOLN
160.0000 mg | Freq: Two times a day (BID) | INTRAMUSCULAR | Status: DC
  Filled 2021-12-04 (×2): qty 16

## 2021-12-04 MED ORDER — FUROSEMIDE 10 MG/ML IJ SOLN
80.0000 mg | Freq: Once | INTRAMUSCULAR | Status: AC
  Administered 2021-12-04: 80 mg via INTRAVENOUS
  Filled 2021-12-04: qty 8

## 2021-12-04 MED ORDER — FUROSEMIDE 10 MG/ML IJ SOLN
30.0000 mg/h | INTRAVENOUS | Status: DC
  Administered 2021-12-04 – 2021-12-14 (×35): 30 mg/h via INTRAVENOUS
  Filled 2021-12-04 (×38): qty 20

## 2021-12-04 NOTE — Progress Notes (Addendum)
Patient ID: Steve Salazar, male   DOB: 04-16-1951, 71 y.o.   MRN: [id number]     Advanced Heart Failure Rounding Note  PCP-Cardiologist: Ida Rogue, MD   Subjective:    7/31: Milrinone d/c 2/2 hypotension. Transitioned to DBA 5 + Lasix gtt at 15/hr. Midodrine added.  8/1: Co-ox 48%, DBA increased to 7.5. Metolazone added to augment diuresis  8/2: Lasix gtt increased to 20/hr + 2.5 metolazone  8/3: NE weaned off. Lasix gtt off. Started PO torsemide.  8/5: NE restarted  CO-OX 60% on DBA 7.5, NE up to 8. Also on midodrine 10 TID. SBP 80s-90s.  CVP 15 but waveform flat. Given IV lasix 160 IV BID yesterday. 1.1L UOP charted for the day. Scr continues to rise, 4.45>4.7>5.14>5.32. Na 123, K 4.2.   Still with poor PO intake. Drinking some ensure shakes. Ambulated halls last night. No dyspnea, orthopnea or PND.    Objective:   Weight Range: 90.6 kg Body mass index is 27.09 kg/m.   Vital Signs:   Temp:  [97.4 F (36.3 C)-98.2 F (36.8 C)] 97.5 F (36.4 C) (08/07 0400) Pulse Rate:  [82-116] 89 (08/07 0615) Resp:  [0-33] 21 (08/07 0615) BP: (73-101)/(56-80) 94/75 (08/07 0600) SpO2:  [75 %-100 %] 92 % (08/07 0615) Weight:  [90.6 kg] 90.6 kg (08/07 0500) Last BM Date : 12/02/21  Weight change: Filed Weights   12/02/21 0500 12/03/21 0600 12/04/21 0500  Weight: 87.8 kg 89.4 kg 90.6 kg    Intake/Output:   Intake/Output Summary (Last 24 hours) at 12/04/2021 0745 Last data filed at 12/04/2021 0501 Gross per 24 hour  Intake 2042.95 ml  Output 1160 ml  Net 882.95 ml      Physical Exam    General:  Sitting up in chair. No distress. HEENT: normal Neck: supple. JVP to jaw. Carotids 2+ bilat; no bruits.  Cor: PMI nondisplaced. Regular rate & rhythm. No rubs, gallops or murmurs. Lungs: clear Abdomen: soft, nontender, nondistended.  Extremities: no cyanosis, clubbing, rash, 1-2 + edema Neuro: alert & orientedx3, cranial nerves grossly intact. moves all 4 extremities w/o  difficulty. Affect pleasant    Telemetry   Afib with BiV pacing, rate 90s, ~ 2 PVCs/min  Labs    CBC Recent Labs    12/03/21 0404 12/04/21 0408  WBC 8.5 10.1  HGB 12.5* 12.8*  HCT 37.1* 37.8*  MCV 85.3 85.7  PLT 206 [id number]   Basic Metabolic Panel Recent Labs    12/03/21 0404 12/04/21 0408  NA 125* 123*  K 3.7 4.2  CL 88* 89*  CO2 24 23  GLUCOSE 133* 104*  BUN 85* 93*  CREATININE 5.14* 5.32*  CALCIUM 8.5* 8.6*  MG 2.5*  --    Liver Function Tests No results for input(s): "AST", "ALT", "ALKPHOS", "BILITOT", "PROT", "ALBUMIN" in the last 72 hours.  No results for input(s): "LIPASE", "AMYLASE" in the last 72 hours. Cardiac Enzymes No results for input(s): "CKTOTAL", "CKMB", "CKMBINDEX", "TROPONINI" in the last 72 hours.  BNP: BNP (last 3 results) No results for input(s): "BNP" in the last 8760 hours.  ProBNP (last 3 results) No results for input(s): "PROBNP" in the last 8760 hours.   D-Dimer No results for input(s): "DDIMER" in the last 72 hours. Hemoglobin A1C No results for input(s): "HGBA1C" in the last 72 hours.  Fasting Lipid Panel No results for input(s): "CHOL", "HDL", "LDLCALC", "TRIG", "CHOLHDL", "LDLDIRECT" in the last 72 hours.  Thyroid Function Tests No results for input(s): "TSH", "T4TOTAL", "T3FREE", "THYROIDAB"  in the last 72 hours.  Invalid input(s): "FREET3"  Other results:   Imaging    No results found.   Medications:     Scheduled Medications:  amiodarone  200 mg Oral BID   apixaban  5 mg Oral BID   Chlorhexidine Gluconate Cloth  6 each Topical Daily   colchicine  0.3 mg Oral Daily   feeding supplement  1 Container Oral TID BM   insulin aspart  0-15 Units Subcutaneous Q4H   midodrine  10 mg Oral Q8H   oxybutynin  5 mg Oral TID   simethicone  80 mg Oral QID    Infusions:  sodium chloride     sodium chloride 10 mL/hr at 12/04/21 0600   sodium chloride     sodium chloride     DOBUTamine 7.5 mcg/kg/min (12/04/21  0600)   norepinephrine (LEVOPHED) Adult infusion 8 mcg/min (12/04/21 0600)    PRN Medications: Place/Maintain arterial line **AND** sodium chloride, Place/Maintain arterial line **AND** sodium chloride, acetaminophen **OR** acetaminophen (TYLENOL) oral liquid 160 mg/5 mL **OR** acetaminophen, alum & mag hydroxide-simeth, mouth rinse, senna-docusate    Assessment/Plan   1. CVA: Acute left MCA CVA, likely cardioembolic.  Has dilated cardiomyopathy, also chronic atrial fibrillation. Echo did not show LV thrombus.  He is now s/p thrombectomy with residual small SAH. Per family, he probably was not taking his Eliquis at home.  C/w expressive aphasia, also aspiration risk (on full liquids currently).  - Heparin switched to Eliquis (not a drug failure as he apparently was not taking it). 2. Atrial fibrillation: Chronic, s/p AV nodal ablation.  Had successful DCCV in 6/23 but now back in AF.  - Eliquis 5 mg bid   3. AKI on CKD stage 3: Baseline creatinine 1.8-2 range, suspect cardiorenal syndrome with superimposed ATN from hypotension earlier in hospitalization and possible CIN contribution. Creatinine up to 5.32 today. UOP 1.1 L yesterday. CVP 15 but waveform flat, appears overloaded on exam. Give IV lasix 160 BID + 5 mg metolazone.  - Continue inotropic support but suspect we are nearing a palliative situation w/ end-stage HF. Not good candidate for long-term HD.   - Will try high dose IV Lasix today.  If does not respond, we could do CVVH but we do not have a good end point for this (poor iHD candidate with end-stage CHF).  4. 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  Last cath in 12/21 with normal coronaries.  Echo this admission with EF 20%, severe LV dilation, severe biatrial enlargement, moderate RV enlargement with mildly decreased systolic function, mod-severe MR, moderate TR, dilated IVC.  Has [employer] CRT-D device.  He has known low output/end stage HF, was started  on milrinone gtt 0.2 at [facility] in 12/22.  He so far has refused LVAD.  He had been off milrinone initially after admission, co-ox was 40% with AKI.  Milrinone restarted 7/29 at 0.375 and NE added to maintain MAP (had been on vasopressin). Milrinone later discontinued due to soft BPs. Now on DBA 7.5 and NE 5 with co-ox 62%. Diuretics stopped with worsening AKI, but developed volume overload. Now back on IV lasix as above. Creatinine up to 5.32 today.   - Continue current dobutamine 7.5.  - Had SBPs 80s early am, now 90s. MAP 70s-80s. - Will try to diurese again today with rising creatinine, will give Lasix 160 mg IV bid and metolazone. If this is unsuccessful, only way to decongest would  be CVVH, but as above, there does not appear to be an end point for this. - Apply TED hose - There is consideration in notes about barostimulator placement, however do not think this would be helpful with such advanced HF (inotrope-dependent).  - Patient has refused LVAD in the past, now with large left MCA CVA.  He is not currently an LVAD candidate with the acute CVA (will need to see functional recovery), and given apparent lack of compliance with apixaban would be concerned about his compliance with warfarin if an LVAD were implanted. Now with rising creatinine, we are heading in the wrong direction.  Palliative care is following. Appears to be moving towards a palliative/hospice situation given MSOF. Family wishes for him to remain full code. 5. Mitral regurgitation: Moderate-severe functional MR.  LVESD > 7 cm, would not be Mitraclip candidate.  6. PVCs/NSVT: Continue amiodarone po.  - keep K > 4.0 and Mg > 2.0    Long-term prognosis remains poor, would not be iHD candidate and not LVAD candidate.  Our hope at this point would be renal recovery then home on dobutamine gtt. Palliative Care following. Family wishes for patient to remain full code.    Steve Salazar, Steve Salazar. 12/04/2021 7:45 AM  Patient seen with PA, agree  with the above note.   Creatinine continues to rise, up to 5.32 this morning with Na down to 123. He remains alert but aphasic, walking in halls. CVP waveform poor, weight up. 1110 cc UOP yesterday.   General: NAD Neck: JVP 16+, no thyromegaly or thyroid nodule.  Lungs: Clear to auscultation bilaterally with normal respiratory effort. CV: Lateral PMI.  Heart regular S1/S2, no S3/S4, no murmur.  1+ ankle edema.  Abdomen: Soft, nontender, no hepatosplenomegaly, no distention.  Skin: Intact without lesions or rashes.  Neurologic: Alert and oriented x 3.  Psych: Normal affect. Extremities: No clubbing or cyanosis.  HEENT: Normal.   Worsening renal function on dobutamine and NE, co-ox adequate at 60%.  He is significantly volume overloaded but we are unable to effectively diurese him.  - Continue dobutamine and NE.  - Tolvaptan 15 mg x 1 for hypervolemic hyponatremia and keep fluid restricted.  - Lasix 80 mg IV x 1 then gtt at 30 mg/hr.  Avoid metolazone with hyponatremia.   Increasingly poor prognosis.  Discussed with Dr. Arrie Aran, not candidate for HD with end-stage CHF and no long-term management options.  Medical staff and palliative care consultant have had GOC discussions with patient and family.  Children want him to remain full code.  I have told his sister and brother who are at bedside that I worry that he could have a cardiac arrest in the near future as his electrolyte imbalance worsens.   CRITICAL CARE Performed by: Marca Ancona  Total critical care time: 45 minutes  Critical care time was exclusive of separately billable procedures and treating other patients.  Critical care was necessary to treat or prevent imminent or life-threatening deterioration.  Critical care was time spent personally by me on the following activities: development of treatment plan with patient and/or surrogate as well as nursing, discussions with consultants, evaluation of patient's response to  treatment, examination of patient, obtaining history from patient or surrogate, ordering and performing treatments and interventions, ordering and review of laboratory studies, ordering and review of radiographic studies, pulse oximetry and re-evaluation of patient's condition.  Marca Ancona. 12/04/2021 9:51 AM

## 2021-12-04 NOTE — TOC Progression Note (Signed)
Transition of Care [facility]) - Progression Note    Patient Details  Name: Steve Salazar MRN: [id number] Date of Birth: [birth year]  [facility] CM/SW Contact  Delilah Shan, LCSWA Phone Number: 12/04/2021, 1:53 PM  Clinical Narrative:     Patient has SNF bed at [facility] when medically ready for dc. CSW following to start insurance authorization closer to patient being medically ready for dc. CSW will continue to follow and assist with patients dc planning needs.  Expected Discharge Plan: Skilled Nursing Facility Barriers to Discharge: SNF Pending bed offer  Expected Discharge Plan and Services Expected Discharge Plan: Skilled Nursing Facility     Post Acute Care Choice: Skilled Nursing Facility Living arrangements for the past 2 months: Single Family Home                                       Social Determinants of Health (SDOH) Interventions    Readmission Risk Interventions     No data to display

## 2021-12-04 NOTE — Progress Notes (Signed)
NAME:  Steve Salazar, MRN:  [id number], DOB:  Mar 10, 1951, LOS: 11 ADMISSION DATE:  2021-12-07, CONSULTATION DATE: 11/26/2021 REFERRING MD: Dr. 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  Patient remained hypotensive, required initiation of vasopressor, PCCM was consulted for help evaluation and medical management  Pertinent  Medical History   Past Medical History:  Diagnosis Date   CKD (chronic kidney disease), stage II    COVID-19 virus infection 03/2019   HFrEF (heart failure with reduced ejection fraction) (HCC)    a. 2011 Echo: EF 25-30%; b. 10/2016 Echo: EF 20-25%; c. 11/2019 Echo: EF 20-25%, mild LVH, Gr2 DD, sev dil LA, mod dil RA, mod MR, mild to mod AI, mod PR, Asc Ao 65mm.   Hypertension    NICM (nonischemic cardiomyopathy) (HCC)    a. 2011 Echo: EF 25-30%; b. 2011 MV: basal to dist inf perfusion defect->atten; c. 10/2016 Echo: EF 20-25%; d. 11/2019 Echo: EF 20-25%; e. 11/2019 MV: No ischemia. Fixed inflat defect, inflat HK, EF 17%; f. 03/2020 Cath: Nl cors. CO/CI 4.07/1.92.   PSVT (paroxysmal supraventricular tachycardia) (HCC)    a. 12/2016 Event monitor: rare, short episodes of SVT.    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 Renal function and LFTs both elevated   Interim History / Subjective:  No  events. Wants to know when he can go home. Remains on levophed/dobutamine.  Objective   Blood pressure 90/67, pulse 89, temperature (!) 97.5 F (36.4 C), temperature source Oral, resp. rate 12, height 6' (1.829 m), weight 90.6 kg, SpO2 93 %. CVP:  [15 mmHg-19 mmHg] 16 mmHg      Intake/Output Summary (Last 24 hours) at 12/04/2021 0830 Last data filed at 12/04/2021 0800 Gross per 24 hour  Intake 1998.45 ml  Output 1160 ml  Net 838.45 ml    Filed Weights   12/02/21 0500 12/03/21 0600 12/04/21 0500  Weight: 87.8 kg 89.4 kg 90.6 kg    Examination: Chronically ill Moves all 4 ext but weak on R Aphasia improved from last week Lungs are clear, no accessory muscle use Ext 1+ edema  SvO2 60% on dobut 5, levo 8 Stable hyponatremia and elevated Cr  Resolved Hospital Problem list   Post thrombectomy subarachnoid hemorrhage  Assessment & Plan:  Acute left MCA stroke s/p thrombectomy Dysphagia due to stroke Etiology cardioembolic in the setting of afib one eliquis and cardiomyopathy with low EF.  Residual deficits include aphasia and slight R sided weakness both improved - Continue secondary stroke prophylaxis, dysphagia diet  Acute on chronic systolic CHF with cardiogenic shock, cardiorenal syndrome Shock liver Baseline inotrope dependent NICM Chronic atrial fibrillation- currently appears to be pacer dependent with frequent ectopy Hypervolemic hyponatremia- due to deranged ADH axis - Inotrope and diuretic titration per CHF team - Not HD candidate from my standpoint as there is no bridge - Work on weaning down levophed today, MAP currently in 70s and MAP push does not seem  to be working - Continue GOC discussions - Continue eliquis - Will follow  Best Practice (right click and "Reselect all SmartList Selections" daily)   Diet/type: Dysphagia diet DVT prophylaxis: DOAC GI prophylaxis: N/A Lines: yes and it is still needed as port Foley: N/A Code Status:  full code Last date  of multidisciplinary goals of care discussion: see palliative notes  32 min cc time Myrla Halsted MD PCCM [place] [facility] See Amion for pager If no response to pager, please call [phone] until 7pm After 7pm, Please call E-link [phone]

## 2021-12-04 NOTE — Progress Notes (Addendum)
Patient ID: KELLAN RAFFIELD, male   DOB: May 27, 1950, 71 y.o.   MRN: [id number] S: No events overnight and remains on levophed and dobutamine.  Was + 300 mL overnight despite high dose IV lasix. O:BP 90/67 (BP Location: Left Arm)   Pulse 89   Temp (!) 97.5 F (36.4 C) (Oral)   Resp 12   Ht 6' (1.829 m)   Wt 90.6 kg   SpO2 93%   BMI 27.09 kg/m   Intake/Output Summary (Last 24 hours) at 12/04/2021 1045 Last data filed at 12/04/2021 0900 Gross per 24 hour  Intake 1564.45 ml  Output 1235 ml  Net 329.45 ml   Intake/Output: I/O last 3 completed shifts: In: 2560.8 [P.O.:1074; I.V.:1320.9; IV Piggyback:165.9] Out: 1440 [Urine:1390; Emesis/NG output:50]  Intake/Output this shift:  Total I/O In: 174.4 [I.V.:174.4] Out: 75 [Urine:75] Weight change: 1.2 kg Gen: NAD CVS: RRR Resp:CTA Abd: +BS, soft, NT/ND Ext: 1+ pitting edema of BLE  Recent Labs  Lab 11/28/21 0321 11/29/21 0412 11/30/21 0355 11/30/21 1619 12/01/21 0340 12/02/21 0527 12/03/21 0404 12/04/21 0408  NA 127* 129* 127* 129* 127* 129* 125* 123*  K 4.3 3.7 3.5 3.6 3.5 3.2* 3.7 4.2  CL 95* 94* 92* 91* 90* 89* 88* 89*  CO2 19* 23 24 23 23 24 24 23   GLUCOSE 98 103* 104* 95 100* 79 133* 104*  BUN 49* 51* 55* 66* 66* 75* 85* 93*  CREATININE 3.20* 3.34* 3.62* 4.12* 4.45* 4.87* 5.14* 5.32*  ALBUMIN 3.0* 2.9* 2.9*  --   --   --   --   --   CALCIUM 8.4* 8.5* 8.5* 8.8* 8.7* 8.6* 8.5* 8.6*  PHOS 6.3* 6.4* 6.5*  --   --   --   --   --   AST 78* 52* 44*  --   --   --   --   --   ALT 72* 78* 73*  --   --   --   --   --    Liver Function Tests: Recent Labs  Lab 11/28/21 0321 11/29/21 0412 11/30/21 0355  AST 78* 52* 44*  ALT 72* 78* 73*  ALKPHOS 58 57 58  BILITOT 2.1* 2.4* 2.5*  PROT 5.6* 5.5* 5.7*  ALBUMIN 3.0* 2.9* 2.9*   No results for input(s): "LIPASE", "AMYLASE" in the last 168 hours. No results for input(s): "AMMONIA" in the last 168 hours. CBC: Recent Labs  Lab 11/30/21 0355 12/01/21 0340 12/02/21 0527  12/03/21 0404 12/04/21 0408  WBC 8.2 6.9 7.6 8.5 10.1  HGB 13.6 12.6* 12.8* 12.5* 12.8*  HCT 39.6 36.7* 38.3* 37.1* 37.8*  MCV 86.3 85.2 86.1 85.3 85.7  PLT 179 171 192 206 228   Cardiac Enzymes: No results for input(s): "CKTOTAL", "CKMB", "CKMBINDEX", "TROPONINI" in the last 168 hours. CBG: Recent Labs  Lab 12/03/21 1541 12/03/21 2000 12/03/21 2348 12/04/21 0414 12/04/21 0742  GLUCAP 116* 149* 157* 108* 127*    Iron Studies: No results for input(s): "IRON", "TIBC", "TRANSFERRIN", "FERRITIN" in the last 72 hours. Studies/Results: No results found.  amiodarone  200 mg Oral BID   apixaban  5 mg Oral BID   Chlorhexidine Gluconate Cloth  6 each Topical Daily   colchicine  0.3 mg Oral Daily   feeding supplement  1 Container Oral TID BM   furosemide  80 mg Intravenous Once   insulin aspart  0-15 Units Subcutaneous Q4H   midodrine  10 mg Oral Q8H   oxybutynin  5 mg Oral TID  simethicone  80 mg Oral QID   tolvaptan  15 mg Oral Once    BMET    Component Value Date/Time   NA 123 (L) 12/04/2021 0408   NA 143 08/23/2020 1522   NA 141 07/27/2014 0729   K 4.2 12/04/2021 0408   K 4.0 07/27/2014 0729   CL 89 (L) 12/04/2021 0408   CL 107 07/27/2014 0729   CO2 23 12/04/2021 0408   CO2 27 07/27/2014 0729   GLUCOSE 104 (H) 12/04/2021 0408   GLUCOSE 125 (H) 07/27/2014 0729   BUN 93 (H) 12/04/2021 0408   BUN 18 08/23/2020 1522   BUN 16 07/27/2014 0729   CREATININE 5.32 (H) 12/04/2021 0408   CREATININE 1.31 (H) 07/27/2014 0729   CALCIUM 8.6 (L) 12/04/2021 0408   CALCIUM 8.9 07/27/2014 0729   GFRNONAA 11 (L) 12/04/2021 0408   GFRNONAA 57 (L) 07/27/2014 0729   GFRAA 75 03/14/2020 1515   GFRAA >60 07/27/2014 0729   CBC    Component Value Date/Time   WBC 10.1 12/04/2021 0408   RBC 4.41 12/04/2021 0408   HGB 12.8 (L) 12/04/2021 0408   HGB 12.9 (L) 03/14/2020 1515   HCT 37.8 (L) 12/04/2021 0408   HCT 40.2 03/14/2020 1515   PLT 228 12/04/2021 0408   PLT 162 03/14/2020  1515   MCV 85.7 12/04/2021 0408   MCV 94 03/14/2020 1515   MCV 88 07/27/2014 0729   MCH 29.0 12/04/2021 0408   MCHC 33.9 12/04/2021 0408   RDW 14.2 12/04/2021 0408   RDW 13.2 03/14/2020 1515   RDW 15.4 (H) 07/27/2014 0729   LYMPHSABS 1.7 2021-12-11 1617   LYMPHSABS 1.0 03/14/2020 1515   MONOABS 0.8 11-Dec-2021 1617   EOSABS 0.0 December 11, 2021 1617   EOSABS 0.1 03/14/2020 1515   BASOSABS 0.1 12-11-21 1617   BASOSABS 0.1 03/14/2020 1515     Assessment/Plan:  AKI/CKD stage IIIa - in the setting of decompensated CHF.  He has had several episodes due to cardiorenal syndrome and has had progressive CKD.  He is not a candidate for HD given his EF of 15% and failure on home milrinone and not a candidate for LVAD due to his recent stroke.  I discussed this with he and his sister and brother who were at the bedside as well as Dr. Shirlee Latch of the heart failure team who is in agreement.  Mr. Sperling and his family understand this but wants to continue with pressors and diuretics for now.   Acute left MCA CVA - likely cardioembolic due to atrial fibrillation.  S/p thrombectomy with residual small SAH.   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  Failed home milrinone.  Now on levophed and dobutamine.  Poor response to high dose IV lasix 160 mg bid yesterday. +JVD.  Discussed with Dr. Shirlee Latch about lasix drip and IV albumin.  Poor overall prognosis but pt remains full code. Hypervolemic hyponatremia - pt is 4 liters positive since admission.  Continue with IV diuresis.  Currently asymptomatic Atrial fibrillation - recurrent.  On Eliquis. Disposition - end stage NICM and not a candidate for HD or LVAD.  Palliative care following.  Poor overall prognosis  Irena Cords, MD [facility]

## 2021-12-04 NOTE — Progress Notes (Addendum)
Palliative:  HPI: 71 y.o. 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 He was admitted on 11/21/2021 with acute left MCA stroke status post thrombectomy, post procedure small subarachnoid hemorrhage, acute on chronic CHF with cardiogenic shock on vasopressors, AKI on CKD, metabolic encephalopathy, shock liver, and others. Palliative was consulted for goals of care conversations.  I met today in the morning with siblings, [name] and Lewis at bedside, and then again in the afternoon with his two sons Legrand Como and [place]) at bedside. I explained to all about Rondrick's poor prognosis with end stage heart failure and kidney failure. I explained the interventions we are providing are band aids to keep him as good as he is now and able to so far be alert and able to interact with them but these will not work for too much longer. I reiterated to them that kidneys continue to worsen. I explained that although he was able to have improved urine output with Lasix infusion and medication changes this did not really improve his kidney function and is not able to keep up with keeping the fluid off that continues to build from both his heart and his kidney failure. I explained again that we need to consider the harms of resuscitation when Aniken's declines - which we do expect to happen but difficult to know when this is going to happen. We discussed symptoms of progression with lethargy, confusion, respiratory distress and we need to consider at some point that we will not be helping him by prolonging his suffering. I discussed with them code status and resuscitation. I reiterated the limited  benefits but the suffering that would come with resuscitative intervention. Beatriz verifies that he does not want to suffer. I asked Dagan if he would want his life prolonged if he were lying in bed and unable to talk and visit with his family or eat and drink and he very clearly shared he would not want to live like that. I shared with him and family that this aligns best with DNR status. They are struggling with making firm decision but sons agree that DNR sounds reasonable but they wish to speak with their aunt [name] and other family before putting in place. I do believe that [name] agrees with DNR status. I explained to Legrand Como that I will allow family to talk tonight and I will call him tomorrow for their decision. I provided Dianah Field with work note for today as well as the future so they can be more readily available in the event of further decline. They are all clear that they do not desire transition to comfort care or hospice at this time. I do not believe that they will desire comfort focused care until he declines further or becomes symptomatic.   All questions/concerns addressed. Emotional support provided.   Exam: Alert. Expressive aphasia complicates conversation and patient able to express his wishes and contribute to his goals of care. No distress. Breathing regular, unlabored. Congested cough. Reflux/hiccups. Abd soft. Moves extremities.   Plan: - Family considering DNR status after discussion.  - Not open to comfort or hospice at this time. They understand poor prognosis. I believe they would be more open if/when he had symptom burden  or with decline in status. However, right now he is sitting up in recliner and smiling and visiting with family. I encouraged them to enjoy these moments as I am unsure how much more time we will have like this.  - I will follow up again tomorrow.   Lyon, NP Palliative Medicine Team Pager [phone] (Please see [email or web address]  for schedule) Team Phone [phone]    Greater than 50%  of this time was spent counseling and coordinating care related to the above assessment and plan

## 2021-12-05 DIAGNOSIS — Z66 Do not resuscitate: Secondary | ICD-10-CM | POA: Diagnosis not present

## 2021-12-05 DIAGNOSIS — R570 Cardiogenic shock: Secondary | ICD-10-CM | POA: Diagnosis not present

## 2021-12-05 DIAGNOSIS — Z515 Encounter for palliative care: Secondary | ICD-10-CM | POA: Diagnosis not present

## 2021-12-05 DIAGNOSIS — Z7189 Other specified counseling: Secondary | ICD-10-CM | POA: Diagnosis not present

## 2021-12-05 LAB — RENAL FUNCTION PANEL
Albumin: 3.1 g/dL — ABNORMAL LOW (ref 3.5–5.0)
Anion gap: 15 (ref 5–15)
BUN: 102 mg/dL — ABNORMAL HIGH (ref 8–23)
CO2: 24 mmol/L (ref 22–32)
Calcium: 8.7 mg/dL — ABNORMAL LOW (ref 8.9–10.3)
Chloride: 87 mmol/L — ABNORMAL LOW (ref 98–111)
Creatinine, Ser: 5.46 mg/dL — ABNORMAL HIGH (ref 0.61–1.24)
GFR, Estimated: 10 mL/min — ABNORMAL LOW (ref >60)
Glucose, Bld: 109 mg/dL — ABNORMAL HIGH (ref 70–99)
Phosphorus: 5.4 mg/dL — ABNORMAL HIGH (ref 2.5–4.6)
Potassium: 3.9 mmol/L (ref 3.5–5.1)
Sodium: 126 mmol/L — ABNORMAL LOW (ref 135–145)

## 2021-12-05 LAB — CBC
HCT: 36.7 % — ABNORMAL LOW (ref 39.0–52.0)
Hemoglobin: 12.6 g/dL — ABNORMAL LOW (ref 13.0–17.0)
MCH: 29.4 pg (ref 26.0–34.0)
MCHC: 34.3 g/dL (ref 30.0–36.0)
MCV: 85.7 fL (ref 80.0–100.0)
Platelets: 225 10*3/uL (ref 150–400)
RBC: 4.28 MIL/uL (ref 4.22–5.81)
RDW: 14.2 % (ref 11.5–15.5)
WBC: 10 10*3/uL (ref 4.0–10.5)
nRBC: 0.4 % — ABNORMAL HIGH (ref 0.0–0.2)

## 2021-12-05 LAB — COOXEMETRY PANEL
Carboxyhemoglobin: 1.5 % (ref 0.5–1.5)
Methemoglobin: 0.7 % (ref 0.0–1.5)
O2 Saturation: 46.6 %
Total hemoglobin: 12.9 g/dL (ref 12.0–16.0)

## 2021-12-05 LAB — GLUCOSE, CAPILLARY
Glucose-Capillary: 200 mg/dL — ABNORMAL HIGH (ref 70–99)
Glucose-Capillary: 100 mg/dL — ABNORMAL HIGH (ref 70–99)
Glucose-Capillary: 112 mg/dL — ABNORMAL HIGH (ref 70–99)
Glucose-Capillary: 102 mg/dL — ABNORMAL HIGH (ref 70–99)
Glucose-Capillary: 112 mg/dL — ABNORMAL HIGH (ref 70–99)
Glucose-Capillary: 144 mg/dL — ABNORMAL HIGH (ref 70–99)

## 2021-12-05 MED ORDER — ENSURE ENLIVE PO LIQD
237.0000 mL | Freq: Three times a day (TID) | ORAL | Status: DC
  Administered 2021-12-05 – 2021-12-13 (×16): 237 mL via ORAL

## 2021-12-05 MED ORDER — TOLVAPTAN 15 MG PO TABS
15.0000 mg | ORAL_TABLET | Freq: Once | ORAL | Status: AC
  Administered 2021-12-05: 15 mg via ORAL
  Filled 2021-12-05: qty 1

## 2021-12-05 NOTE — Progress Notes (Signed)
Patient ID: Steve Salazar, male   DOB: [birth year], 70 y.o.   MRN: [id number] S: No complaints. O:BP (!) 81/59   Pulse 91   Temp 97.8 F (36.6 C) (Axillary)   Resp 14   Ht 6' (1.829 m)   Wt 88.9 kg   SpO2 95%   BMI 26.58 kg/m   Intake/Output Summary (Last 24 hours) at 12/05/2021 1002 Last data filed at 12/05/2021 0900 Gross per 24 hour  Intake 1129.93 ml  Output 2310 ml  Net -1180.07 ml   Intake/Output: I/O last 3 completed shifts: In: 1709.3 [P.O.:360; I.V.:1299.3; IV Piggyback:50] Out: 2765 [Urine:2765]  Intake/Output this shift:  Total I/O In: 328.3 [P.O.:240; I.V.:88.3] Out: 160 [Urine:160] Weight change: -1.7 kg Gen:NAD CVS: RRR Resp: CTA Abd: +BS, soft, NT/ND Ext: 1+ pretibial edema  Recent Labs  Lab 11/29/21 0412 11/30/21 0355 11/30/21 1619 12/01/21 0340 12/02/21 0527 12/03/21 0404 12/04/21 0408 12/04/21 1751 12/05/21 0346  NA 129* 127* 129* 127* 129* 125* 123* 125* 126*  K 3.7 3.5 3.6 3.5 3.2* 3.7 4.2  --  3.9  CL 94* 92* 91* 90* 89* 88* 89*  --  87*  CO2 23 24 23 23 24 24 23   --  24  GLUCOSE 103* 104* 95 100* 79 133* 104*  --  109*  BUN 51* 55* 66* 66* 75* 85* 93*  --  102*  CREATININE 3.34* 3.62* 4.12* 4.45* 4.87* 5.14* 5.32*  --  5.46*  ALBUMIN 2.9* 2.9*  --   --   --   --   --   --  3.1*  CALCIUM 8.5* 8.5* 8.8* 8.7* 8.6* 8.5* 8.6*  --  8.7*  PHOS 6.4* 6.5*  --   --   --   --   --   --  5.4*  AST 52* 44*  --   --   --   --   --   --   --   ALT 78* 73*  --   --   --   --   --   --   --    Liver Function Tests: Recent Labs  Lab 11/29/21 0412 11/30/21 0355 12/05/21 0346  AST 52* 44*  --   ALT 78* 73*  --   ALKPHOS 57 58  --   BILITOT 2.4* 2.5*  --   PROT 5.5* 5.7*  --   ALBUMIN 2.9* 2.9* 3.1*   No results for input(s): "LIPASE", "AMYLASE" in the last 168 hours. No results for input(s): "AMMONIA" in the last 168 hours. CBC: Recent Labs  Lab 12/01/21 0340 12/02/21 0527 12/03/21 0404 12/04/21 0408 12/05/21 0346  WBC 6.9 7.6 8.5 10.1  10.0  HGB 12.6* 12.8* 12.5* 12.8* 12.6*  HCT 36.7* 38.3* 37.1* 37.8* 36.7*  MCV 85.2 86.1 85.3 85.7 85.7  PLT 171 192 206 228 225   Cardiac Enzymes: No results for input(s): "CKTOTAL", "CKMB", "CKMBINDEX", "TROPONINI" in the last 168 hours. CBG: Recent Labs  Lab 12/04/21 1102 12/04/21 1611 12/04/21 2018 12/05/21 0342 12/05/21 0738  GLUCAP 85 91 132* 200* 100*    Iron Studies: No results for input(s): "IRON", "TIBC", "TRANSFERRIN", "FERRITIN" in the last 72 hours. Studies/Results: No results found.  amiodarone  200 mg Oral BID   apixaban  5 mg Oral BID   Chlorhexidine Gluconate Cloth  6 each Topical Daily   colchicine  0.3 mg Oral Daily   feeding supplement  1 Container Oral TID BM   insulin aspart  0-15 Units Subcutaneous  Q4H   midodrine  10 mg Oral Q8H   oxybutynin  5 mg Oral TID   simethicone  80 mg Oral QID    BMET    Component Value Date/Time   NA 126 (L) 12/05/2021 0346   NA 143 08/23/2020 1522   NA 141 07/27/2014 0729   K 3.9 12/05/2021 0346   K 4.0 07/27/2014 0729   CL 87 (L) 12/05/2021 0346   CL 107 07/27/2014 0729   CO2 24 12/05/2021 0346   CO2 27 07/27/2014 0729   GLUCOSE 109 (H) 12/05/2021 0346   GLUCOSE 125 (H) 07/27/2014 0729   BUN 102 (H) 12/05/2021 0346   BUN 18 08/23/2020 1522   BUN 16 07/27/2014 0729   CREATININE 5.46 (H) 12/05/2021 0346   CREATININE 1.31 (H) 07/27/2014 0729   CALCIUM 8.7 (L) 12/05/2021 0346   CALCIUM 8.9 07/27/2014 0729   GFRNONAA 10 (L) 12/05/2021 0346   GFRNONAA 57 (L) 07/27/2014 0729   GFRAA 75 03/14/2020 1515   GFRAA >60 07/27/2014 0729   CBC    Component Value Date/Time   WBC 10.0 12/05/2021 0346   RBC 4.28 12/05/2021 0346   HGB 12.6 (L) 12/05/2021 0346   HGB 12.9 (L) 03/14/2020 1515   HCT 36.7 (L) 12/05/2021 0346   HCT 40.2 03/14/2020 1515   PLT 225 12/05/2021 0346   PLT 162 03/14/2020 1515   MCV 85.7 12/05/2021 0346   MCV 94 03/14/2020 1515   MCV 88 07/27/2014 0729   MCH 29.4 12/05/2021 0346   MCHC  34.3 12/05/2021 0346   RDW 14.2 12/05/2021 0346   RDW 13.2 03/14/2020 1515   RDW 15.4 (H) 07/27/2014 0729   LYMPHSABS 1.7 11/11/2021 1617   LYMPHSABS 1.0 03/14/2020 1515   MONOABS 0.8 11/13/2021 1617   EOSABS 0.0 11/20/2021 1617   EOSABS 0.1 03/14/2020 1515   BASOSABS 0.1 10/29/2021 1617   BASOSABS 0.1 03/14/2020 1515    Assessment/Plan:   AKI/CKD stage IIIa - in the setting of decompensated CHF.  He has had several episodes due to cardiorenal syndrome and has had progressive CKD.  He is not a candidate for HD given his EF of 15% and failure on home milrinone and not a candidate for LVAD due to his recent stroke.  I discussed this with he and his sister and brother who were at the bedside as well as Dr. Shirlee Latch of the heart failure team who are in agreement.  Steve Salazar and his family understand this but wants to continue with pressors and diuretics for now.  No RRT. Acute left MCA CVA - likely cardioembolic due to atrial fibrillation.  S/p thrombectomy with residual small SAH.   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  Failed home milrinone.  Now on levophed and dobutamine.  Poor response to high dose IV lasix 160 mg bid yesterday. +JVD.  Discussed with Dr. Shirlee Latch about lasix drip and IV albumin.  Some improved UOP overnight and was negative 1 L.  Still up 3.5L.  Poor overall prognosis but pt remains full code. Hypervolemic hyponatremia - pt is 4 liters positive since admission.  Continue with IV diuresis.  Currently asymptomatic Atrial fibrillation - recurrent.  On Eliquis. Disposition - end stage NICM and not a candidate for HD or LVAD.  Palliative care following.  Poor overall prognosis  Irena Cords, MD [facility]

## 2021-12-05 NOTE — Progress Notes (Signed)
NAME:  Steve Salazar, MRN:  [id number], DOB:  09/10/1950, LOS: 12 ADMISSION DATE:  11/11/2021, CONSULTATION DATE: 10/29/2021 REFERRING MD: Dr. 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  Patient remained hypotensive, required initiation of vasopressor, PCCM was consulted for help evaluation and medical management  Pertinent  Medical History   Past Medical History:  Diagnosis Date   CKD (chronic kidney disease), stage II    COVID-19 virus infection 03/2019   HFrEF (heart failure with reduced ejection fraction) (HCC)    a. 2011 Echo: EF 25-30%; b. 10/2016 Echo: EF 20-25%; c. 11/2019 Echo: EF 20-25%, mild LVH, Gr2 DD, sev dil LA, mod dil RA, mod MR, mild to mod AI, mod PR, Asc Ao 35mm.   Hypertension    NICM (nonischemic cardiomyopathy) (HCC)    a. 2011 Echo: EF 25-30%; b. 2011 MV: basal to dist inf perfusion defect->atten; c. 10/2016 Echo: EF 20-25%; d. 11/2019 Echo: EF 20-25%; e. 11/2019 MV: No ischemia. Fixed inflat defect, inflat HK, EF 17%; f. 03/2020 Cath: Nl cors. CO/CI 4.07/1.92.   PSVT (paroxysmal supraventricular tachycardia) (HCC)    a. 12/2016 Event monitor: rare, short episodes of SVT.    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 Renal function and LFTs both elevated   Interim History / Subjective:  No  events. Family at bedside. UOP okay.  CVP still up.  Levo needs decreasing.  Objective   Blood pressure (!) 84/63, pulse 90, temperature 97.8 F (36.6 C), temperature source Axillary, resp. rate 14, height 6' (1.829 m), weight 88.9 kg, SpO2 96 %. CVP:  [13 mmHg-57 mmHg] 18 mmHg      Intake/Output Summary (Last 24 hours) at 12/05/2021 0865 Last data filed at 12/05/2021 0800 Gross per 24 hour  Intake 1156.7 ml  Output 2230 ml  Net -1073.3 ml    Filed Weights   12/03/21 0600 12/04/21 0500 12/05/21 0500  Weight: 89.4 kg 90.6 kg 88.9 kg    Examination: Chronically ill Moves all 4 ext but weaker on R Still has aphasia Lungs clear Ext lukewarm  SvO2 46% on dobut 5, levo 2 Hyponatremia improved after tolvaptan K okay BUN/Cr creeping up CBC okay  Resolved Hospital Problem list   Post thrombectomy subarachnoid hemorrhage  Assessment & Plan:  Acute left MCA stroke s/p thrombectomy Dysphagia due to stroke Etiology cardioembolic in the setting of afib one eliquis and cardiomyopathy with low EF.  Residual deficits include aphasia and slight R sided weakness both improved - Continue secondary stroke prophylaxis, dysphagia diet - Continue mobilization efforts  Acute on chronic systolic CHF with cardiogenic shock, cardiorenal syndrome Shock liver Baseline inotrope dependent NICM Chronic atrial fibrillation- currently appears to be pacer dependent with frequent ectopy Hypervolemic hyponatremia- due to deranged ADH axis - Inotrope and diuretic titration per CHF team - Not HD candidate - Levophed to MAP 65 - Continue GOC discussions - Continue eliquis - Will follow  Best Practice (right click and "  Reselect all SmartList Selections" daily)   Diet/type: Dysphagia diet DVT prophylaxis: DOAC GI prophylaxis: N/A Lines: yes and it is still needed as port Foley: N/A Code Status:  full code Last date of multidisciplinary goals of care discussion: see palliative notes  33 min cc  time Myrla Halsted MD PCCM  Pulmonary Critical Care See Amion for pager If no response to pager, please call [phone] until 7pm After 7pm, Please call E-link [phone]

## 2021-12-05 NOTE — TOC CM/SW Note (Signed)
HF TOC CM spoke to pt. Spoke to pt's sister privately. States they are waiting to make final decisions. If pt will dc, prefers dc to [facility] in [place]. States his brother, Melvyn Neth and son, Casimiro Needle will make final decisions. Explained Unit CM will continue to assist with dc needs. Isidoro Donning RN3 CCM, Heart Failure TOC CM [phone]

## 2021-12-05 NOTE — Progress Notes (Signed)
Nutrition Follow-up  DOCUMENTATION CODES:   Not applicable  INTERVENTION:   Ensure Enlive po TID, each supplement provides 350 kcal and 20 grams of protein.  Feeding assistance and encouragement  D/C Boost Breeze  Pt has declined Cortrak placement  NUTRITION DIAGNOSIS:   Inadequate oral intake related to acute illness, dysphagia as evidenced by meal completion < 25% (not swallowing liquids or meds, regurgitating, aspiration risk).  Continues  GOAL:   Patient will meet greater than or equal to 90% of their needs  Not Met  MONITOR:   Diet advancement, PO intake, Supplement acceptance, Labs, Weight trends  REASON FOR ASSESSMENT:   Consult Assessment of nutrition requirement/status  ASSESSMENT:   71 yo male admitted with L MCA stroke with right sided weakness and aphasia. PMH includes CKD, CHF EF 15-20%, HTN  8/04 Cortrak attempted but unsuccessful, pt declined placement 8/06 Diet advanced to Dysphagia 2, Thins  Currently off levophed, remains on dobutamine and lasix gtt  Palliative Care following, pt is now DNR  Pt remains aphasic  Pt is not a candidate for RRT or VAD. End stage NICM. AKI with progressive CKD, BUN >100, Creatinine 5.46. Hypervolemic hyponatremia, UOP 2.3 L in 24 hours on lasix gtt. Remains net + for the admission. Moderate edema in LE, nonpitting in UE  Pt ate 15% at breakfast this AM, 0% at lunch. Pt is drinking some Ensure per RN. Recorded po intake yesterday 0-25%.  Discussed swallowing with RN; RN reports pt tolerating liquids but did spit out some of morning meds. Unsure how much solid food intake pt is taking  Labs: sodium 126 (L), BUN 102, Creatinine 5.46 Meds: ss novolog   Diet Order:   Diet Order             DIET DYS 2 Room service appropriate? Yes; Fluid consistency: Thin; Fluid restriction: 1800 mL Fluid  Diet effective now                   EDUCATION NEEDS:   Not appropriate for education at this time  Skin:  Skin  Assessment: Reviewed RN Assessment  Last BM:  8/5  Height:   Ht Readings from Last 1 Encounters:  11/13/2021 6' (1.829 m)    Weight:   Wt Readings from Last 1 Encounters:  12/05/21 88.9 kg      BMI:  Body mass index is 26.58 kg/m.  Estimated Nutritional Needs:   Kcal:  2000-2200 kcals  Protein:  100-115 g  Fluid:  1.5 L fluid restriction    Cate  MS, RDN, LDN, CNSC Registered Dietitian 3 Clinical Nutrition RD Pager and On-Call Pager Number Located in Amion   

## 2021-12-05 NOTE — Progress Notes (Signed)
Physical Therapy Treatment Patient Details Name: Steve Salazar MRN: [id number] DOB: 13-Nov-1950 Today's Date: 12/05/2021   History of Present Illness 71 y.o. male admitted to [facility] 11/26/2021 with acute aphasia, transfered to [facility] for thrombectomy of acute left M2 occlusion complicated with small amount of subarachnoid hemorrhage. PMHx:Gout, CKD2, NICM, HF with EF 15-20%, HTN, PSVT, ICD, Afib    PT Comments    Pt pleasant with improving gait and activity tolerance despite low BP. Pt with continued aphasia which impacts his ability to follow commands and communicate as well as right inattention. Pt with improved standing trials but noticeably fatigued end of session. Will continue to follow.   Pre gait 84/64, post gait 81/59 HR 91 SpO2 100% RA    Recommendations for follow up therapy are one component of a multi-disciplinary discharge planning process, led by the attending physician.  Recommendations may be updated based on patient status, additional functional criteria and insurance authorization.  Follow Up Recommendations  Skilled nursing-short term rehab (<3 hours/day) Can patient physically be transported by private vehicle: Yes   Assistance Recommended at Discharge Frequent or constant Supervision/Assistance  Patient can return home with the following Assistance with cooking/housework;Direct supervision/assist for medications management;Assist for transportation;Direct supervision/assist for financial management;Help with stairs or ramp for entrance;A little help with walking and/or transfers;A little help with bathing/dressing/bathroom   Equipment Recommendations  BSC/3in1    Recommendations for Other Services       Precautions / Restrictions Precautions Precautions: Fall Precaution Comments: aphasia, R inattention     Mobility  Bed Mobility               General bed mobility comments: in recliner on arrival and end of session    Transfers Overall transfer level:  Needs assistance   Transfers: Sit to/from Stand Sit to Stand: Min guard   Step pivot transfers: Min guard       General transfer comment: guarding for lines with multimodal cues to achieve standing. performed 10 repeated sit to stands from chair in 73 sec due to decreased comprehension of commands    Ambulation/Gait Ambulation/Gait assistance: Min guard Gait Distance (Feet): 400 Feet Assistive device: None Gait Pattern/deviations: Step-through pattern, Decreased stride length   Gait velocity interpretation: 1.31 - 2.62 ft/sec, indicative of limited community ambulator   General Gait Details: pt with decreased stride with periodic increased RUE pushing out to right toward therapist for intermittent balance control grossly 4x during gait. Pt unable to state room number but could locate (family present in room)   Stairs             Wheelchair Mobility    Modified Rankin (Stroke Patients Only)       Balance Overall balance assessment: Needs assistance Sitting-balance support: No upper extremity supported Sitting balance-Leahy Scale: Good     Standing balance support: No upper extremity supported Standing balance-Leahy Scale: Good Standing balance comment: walking without RW                            Cognition Arousal/Alertness: Awake/alert Behavior During Therapy: WFL for tasks assessed/performed Overall Cognitive Status: Impaired/Different from baseline Area of Impairment: Safety/judgement, Attention, Problem solving, Orientation, Memory                 Orientation Level: Disoriented to, Time, Place Current Attention Level: Sustained   Following Commands: Follows one step commands inconsistently, Follows one step commands with increased time, Follows multi-step commands inconsistently  Problem Solving: Slow processing, Requires verbal cues General Comments: pt able to state name and birth month as well as relation to siblings in room,  could not state day, continues to have right inattention and increased cues for multistep commands        Exercises      General Comments        Pertinent Vitals/Pain Pain Assessment Pain Assessment: No/denies pain    Home Living                          Prior Function            PT Goals (current goals can now be found in the care plan section) Acute Rehab PT Goals Patient Stated Goal: walk PT Goal Formulation: With patient Time For Goal Achievement: 12/19/21 Potential to Achieve Goals: Good Progress towards PT goals: Progressing toward goals    Frequency    Min 3X/week      PT Plan Current plan remains appropriate    Co-evaluation              AM-PAC PT "6 Clicks" Mobility   Outcome Measure  Help needed turning from your back to your side while in a flat bed without using bedrails?: A Little Help needed moving from lying on your back to sitting on the side of a flat bed without using bedrails?: A Little Help needed moving to and from a bed to a chair (including a wheelchair)?: A Little Help needed standing up from a chair using your arms (e.g., wheelchair or bedside chair)?: A Little Help needed to walk in hospital room?: A Little Help needed climbing 3-5 steps with a railing? : A Little 6 Click Score: 18    End of Session Equipment Utilized During Treatment: Gait belt Activity Tolerance: Patient tolerated treatment well Patient left: in chair;with call bell/phone within reach;with chair alarm set;with family/visitor present Nurse Communication: Mobility status PT Visit Diagnosis: Other symptoms and signs involving the nervous system (R29.898);Other abnormalities of gait and mobility (R26.89);Difficulty in walking, not elsewhere classified (R26.2)     Time: 7673-4193 PT Time Calculation (min) (ACUTE ONLY): 21 min  Charges:  $Gait Training: 8-22 mins                     Merryl Hacker, PT Acute Rehabilitation Services Office:  [phone]    Cristine Polio 12/05/2021, 9:46 AM

## 2021-12-05 NOTE — TOC Progression Note (Signed)
Transition of Care [facility]) - Progression Note    Patient Details  Name: Steve Salazar MRN: [id number] Date of Birth: 1950-05-18  Transition of Care [facility]) CM/SW Contact  Delilah Shan, LCSWA Phone Number: 12/05/2021, 3:45 PM  Clinical Narrative:     Patient has SNF bed at [facility] when medically ready for dc. CSW following to start insurance authorization closer to patient being medically ready for dc. CSW will continue to follow and assist with patients dc planning needs  Expected Discharge Plan: Skilled Nursing Facility Barriers to Discharge: SNF Pending bed offer  Expected Discharge Plan and Services Expected Discharge Plan: Skilled Nursing Facility     Post Acute Care Choice: Skilled Nursing Facility Living arrangements for the past 2 months: Single Family Home                                       Social Determinants of Health (SDOH) Interventions    Readmission Risk Interventions     No data to display

## 2021-12-05 NOTE — Progress Notes (Addendum)
Patient ID: Steve Salazar, male   DOB: February 24, 1951, 71 y.o.   MRN: [id number]     Advanced Heart Failure Rounding Note  PCP-Cardiologist: [name], MD   Subjective:    7/31: Milrinone d/c 2/2 hypotension. Transitioned to DBA 5 + Lasix gtt at 15/hr. Midodrine added.  8/1: Co-ox 48%, DBA increased to 7.5. Metolazone added to augment diuresis  8/2: Lasix gtt increased to 20/hr + 2.5 metolazone  8/3: NE weaned off. Lasix gtt off. Started PO torsemide.  8/5: NE restarted 8/7: Started on lasix drip at 30. Given tolvaptan.   CO-OX down to 47%. DBA 7.5. Norepi stopped this morning.   Continue on lasix drip at 30. Improved urine output. CVP 18-19.    Scr continues to rise, 4.45>4.7>5.14>5.32>5.46 . Na 126, K 3.9   Denies pain. Denies SOB.    Objective:   Weight Range: 88.9 kg Body mass index is 26.58 kg/m.   Vital Signs:   Temp:  [97.5 F (36.4 C)-98.2 F (36.8 C)] 97.8 F (36.6 C) (08/08 0736) Pulse Rate:  [84-187] 109 (08/08 0515) Resp:  [10-32] 31 (08/08 0515) BP: (82-121)/(56-98) 84/65 (08/08 0600) SpO2:  [91 %-99 %] 97 % (08/08 0515) Weight:  [88.9 kg] 88.9 kg (08/08 0500) Last BM Date : 12/02/21  Weight change: Filed Weights   12/03/21 0600 12/04/21 0500 12/05/21 0500  Weight: 89.4 kg 90.6 kg 88.9 kg    Intake/Output:   Intake/Output Summary (Last 24 hours) at 12/05/2021 0758 Last data filed at 12/05/2021 0600 Gross per 24 hour  Intake 1240.33 ml  Output 2225 ml  Net -984.67 ml    CVP 18-19   Physical Exam    General:  Appears weak.  No resp difficulty HEENT: normal Neck: supple. JVP to jaw . Carotids 2+ bilat; no bruits. No lymphadenopathy or thryomegaly appreciated. Cor: PMI nondisplaced. Regular rate & rhythm. No rubs, gallops or murmurs. Lungs: clear on room air.  Abdomen: soft, nontender, nondistended. No hepatosplenomegaly. No bruits or masses. Good bowel sounds. Extremities: no cyanosis, clubbing, rash, R and LLE 2+ edema Neuro: alert &  orientedx3, cranial nerves grossly intact. moves all 4 extremities w/o difficulty. Affect pleasant GU: Foley      Telemetry   A fib  BiV pacing   Labs    CBC Recent Labs    12/04/21 0408 12/05/21 0346  WBC 10.1 10.0  HGB 12.8* 12.6*  HCT 37.8* 36.7*  MCV 85.7 85.7  PLT 228 225   Basic Metabolic Panel Recent Labs    44/96/75 0404 12/04/21 0408 12/04/21 1751 12/05/21 0346  NA 125* 123* 125* 126*  K 3.7 4.2  --  3.9  CL 88* 89*  --  87*  CO2 24 23  --  24  GLUCOSE 133* 104*  --  109*  BUN 85* 93*  --  102*  CREATININE 5.14* 5.32*  --  5.46*  CALCIUM 8.5* 8.6*  --  8.7*  MG 2.5*  --   --   --   PHOS  --   --   --  5.4*   Liver Function Tests Recent Labs    12/05/21 0346  ALBUMIN 3.1*    No results for input(s): "LIPASE", "AMYLASE" in the last 72 hours. Cardiac Enzymes No results for input(s): "CKTOTAL", "CKMB", "CKMBINDEX", "TROPONINI" in the last 72 hours.  BNP: BNP (last 3 results) No results for input(s): "BNP" in the last 8760 hours.  ProBNP (last 3 results) No results for input(s): "PROBNP" in the  last 8760 hours.   D-Dimer No results for input(s): "DDIMER" in the last 72 hours. Hemoglobin A1C No results for input(s): "HGBA1C" in the last 72 hours.  Fasting Lipid Panel No results for input(s): "CHOL", "HDL", "LDLCALC", "TRIG", "CHOLHDL", "LDLDIRECT" in the last 72 hours.  Thyroid Function Tests No results for input(s): "TSH", "T4TOTAL", "T3FREE", "THYROIDAB" in the last 72 hours.  Invalid input(s): "FREET3"  Other results:   Imaging    No results found.   Medications:     Scheduled Medications:  amiodarone  200 mg Oral BID   apixaban  5 mg Oral BID   Chlorhexidine Gluconate Cloth  6 each Topical Daily   colchicine  0.3 mg Oral Daily   feeding supplement  1 Container Oral TID BM   insulin aspart  0-15 Units Subcutaneous Q4H   midodrine  10 mg Oral Q8H   oxybutynin  5 mg Oral TID   simethicone  80 mg Oral QID     Infusions:  sodium chloride     sodium chloride Stopped (12/04/21 1213)   sodium chloride     sodium chloride     DOBUTamine 7.5 mcg/kg/min (12/05/21 0723)   furosemide (LASIX) 200 mg in dextrose 5 % 100 mL (2 mg/mL) infusion 30 mg/hr (12/05/21 0729)   norepinephrine (LEVOPHED) Adult infusion 2 mcg/min (12/05/21 0600)    PRN Medications: Place/Maintain arterial line **AND** sodium chloride, Place/Maintain arterial line **AND** sodium chloride, acetaminophen **OR** acetaminophen (TYLENOL) oral liquid 160 mg/5 mL **OR** acetaminophen, alum & mag hydroxide-simeth, mouth rinse, senna-docusate    Assessment/Plan   1. CVA: Acute left MCA CVA, likely cardioembolic.  Has dilated cardiomyopathy, also chronic atrial fibrillation. Echo did not show LV thrombus.  He is now s/p thrombectomy with residual small SAH. Per family, he probably was not taking his Eliquis at home.  C/w expressive aphasia, also aspiration risk (on full liquids currently).  - Heparin switched to Eliquis (not a drug failure as he apparently was not taking it). 2. Atrial fibrillation: Chronic, s/p AV nodal ablation.  Had successful DCCV in 6/23 but now back in AF.  - Eliquis 5 mg bid   3. AKI on CKD stage 3: Baseline creatinine 1.8-2 range, suspect cardiorenal syndrome with superimposed ATN from hypotension earlier in hospitalization and possible CIN contribution. Creatinine up to 5.46 today.  Better UOP on Lasix gtt 30 mg/hr (I/Os net negative 985 cc).    - Continue inotropic support but suspect we are nearing a palliative situation w/ end-stage HF. Not good candidate for long-term HD.   - Continue high dose Lasix gtt. No good end point for CVVH (poor iHD candidate with end-stage CHF).  4. 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  Last cath in 12/21 with normal coronaries.  Echo this admission with EF 20%, severe LV dilation, severe biatrial enlargement, moderate RV enlargement with mildly  decreased systolic function, mod-severe MR, moderate TR, dilated IVC.  Has St Jude CRT-D device.  He has known low output/end stage HF, was started on milrinone gtt 0.2 at [facility] in 12/22.  He so far has refused LVAD.  He had been off milrinone initially after admission, co-ox was 40% with AKI.  Milrinone restarted 7/29 at 0.375 and NE added to maintain MAP (had been on vasopressin). Milrinone later discontinued due to soft BPs. Now on DBA 7.5. CO-OX 47%. CVP 18-19.  I/Os were net negative yesterday. MAP 70s off norepinephrine.  - Continue lasix drip 30  mg/hr and give dose of tolvaptan 15 mg again.   - If this is unsuccessful, only way to decongest would be CVVH, but as above, there does not appear to be an end point for this. - Apply TED hose - There is consideration in notes about barostimulator placement, however do not think this would be helpful with such advanced HF (inotrope-dependent).  - Patient has refused LVAD in the past, now with large left MCA CVA.  He is not currently an LVAD candidate with the acute CVA (will need to see functional recovery), and given apparent lack of compliance with apixaban would be concerned about his compliance with warfarin if an LVAD were implanted. Now with rising creatinine, we are heading in the wrong direction.  Palliative care is following. Appears to be moving towards a palliative/hospice situation given MSOF. Family wishes for him to remain full code. 5. Mitral regurgitation: Moderate-severe functional MR.  LVESD > 7 cm, would not be Mitraclip candidate.  6. PVCs/NSVT: Continue amiodarone po.  - keep K > 4.0 and Mg > 2.0   7. GOC  - Palliative Care appreciated.  8. Hyponatremia: Hypervolemic hyponatremia.  - Dose tolvaptan again today.   Long-term prognosis remains poor, would not be iHD candidate and not LVAD candidate.  Palliative Care following. Family wishes for patient to remain full code.    Amy CleggNP-C  12/05/2021 7:58 AM  Patient seen with NP,  agree with the above note.   Currently on dobutamine 7.5 and off NE, MAP stable.  Creatinine marginally higher at 5.42 today. Na 123 => 126 with tolvaptan yesterday.  Co-ox 47%.  On Lasix gtt 30 mg/hr with weight down 4 lbs and I/Os negative.   General: NAD Neck: JVP 16 cm, no thyromegaly or thyroid nodule.  Lungs: Clear to auscultation bilaterally with normal respiratory effort. CV: Lateral PMI.  Heart regular S1/S2, no S3/S4, 2/6 HSM apex.  1+ ankle edema.  Abdomen: Soft, nontender, no hepatosplenomegaly, no distention.  Skin: Intact without lesions or rashes.  Neurologic: Alert, expressive aphasia.  Psych: Normal affect. Extremities: No clubbing or cyanosis.  HEENT: Normal.   Still with volume overload, CVP 18-19.  Some diuresis with Lasix gtt 30 mg/hr + tolvaptan though BUN/creatinine higher.  Co-ox 47% on dobutamine 7.5.  - Continue current dobutamine 7.5.  - Continue Lasix gtt 30 mg/hr - tolvaptan 15 mg x 1 today with hypervolemic hyponatremia.  - Long-term options poor.  Not HD candidate or LVAD candidate.  He has steadily worsening renal failure with end-stage heart failure and ongoing volume overload.  Would be appropriate candidate for hospice/comfort care, family currently wants to continue full scope of care and full code, discussed situation with them again this morning.  Palliative care service continues to follow.   CRITICAL CARE Performed by: Marca Ancona  Total critical care time: 40 minutes  Critical care time was exclusive of separately billable procedures and treating other patients.  Critical care was necessary to treat or prevent imminent or life-threatening deterioration.  Critical care was time spent personally by me on the following activities: development of treatment plan with patient and/or surrogate as well as nursing, discussions with consultants, evaluation of patient's response to treatment, examination of patient, obtaining history from patient or  surrogate, ordering and performing treatments and interventions, ordering and review of laboratory studies, ordering and review of radiographic studies, pulse oximetry and re-evaluation of patient's condition.  Marca Ancona. 12/05/2021 8:27 AM

## 2021-12-05 NOTE — Progress Notes (Signed)
Palliative:  HPI: 71 y.o. 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 He was admitted on 11/27/2021 with acute left MCA stroke status post thrombectomy, post procedure small subarachnoid hemorrhage, acute on chronic CHF with cardiogenic shock on vasopressors, AKI on CKD, metabolic encephalopathy, shock liver, and others. Palliative was consulted for goals of care conversations.   I met today again at [facility] bedside with siblings Steve Salazar and Steve Salazar. Steve Salazar is sleepy today although he does awaken and smile at times but less verbal and interactive during conversation. Family reports that he was walking earlier and perhaps worn out from the activity. We reviewed worsening kidney function on blood work although they were able to get some fluid off this will become increasingly difficult with worsening kidney function. Steve Salazar is also not eating again today. I explained that it is common to lose appetite with critical and end stage illness with multiorgan failure. He did not tolerate Cortrak placement and he is too ill for surgery for PEG placement. I explained to family that unfortunately artificial nutrition is just more fluid his body is unable to tolerate and it will not reverse his heart and kidney failure. Family at bedside report no changes in goals of care but they have not discussed with sons since my conversation with sons yesterday.   I called and spoke with son, Steve Salazar. Steve Salazar confirms desire for DNR but continuing full scope treatment otherwise. He has not spoken with Steve Salazar about DNR status and wants to know what she thinks before putting this in place. Steve Salazar shares that  he is prepared to proceed with DNR if Steve Salazar agrees. I allowed them time to speak together. I followed up with Steve Salazar who confirms that children desire DNR and she will not go against their wishes. 54 of adult children are surrogate decision makers in absence of spouse or documented HCPOA. DNR order placed after discussions. Continue full scope care otherwise.   All questions/concerns addressed. Emotional support provided.   Exam: Sleepy. Less verbal and interactive. Unable to hold conversation today. No distress. Breathing regular, unlabored. Abd soft. BLE edema.   Plan: - DNR decided by 2 sons and confirmed with them and Steve Salazar (patient's sister).  - Full scope care. No desire to pursue hospice or comfort care. I believe they would be more open if/when he has symptom burden or with decline in status.   Slaughters, NP Palliative Medicine Team Pager [phone] (Please see [email or web address] for schedule) Team Phone [phone]    Greater than 50%  of this time was spent counseling and coordinating care related to the above assessment and plan

## 2021-12-06 ENCOUNTER — Encounter (HOSPITAL_COMMUNITY): Payer: Self-pay | Admitting: Neurology

## 2021-12-06 DIAGNOSIS — I482 Chronic atrial fibrillation, unspecified: Secondary | ICD-10-CM | POA: Diagnosis not present

## 2021-12-06 DIAGNOSIS — I5023 Acute on chronic systolic (congestive) heart failure: Secondary | ICD-10-CM | POA: Diagnosis not present

## 2021-12-06 DIAGNOSIS — I639 Cerebral infarction, unspecified: Secondary | ICD-10-CM | POA: Diagnosis not present

## 2021-12-06 DIAGNOSIS — R570 Cardiogenic shock: Secondary | ICD-10-CM | POA: Diagnosis not present

## 2021-12-06 DIAGNOSIS — I63412 Cerebral infarction due to embolism of left middle cerebral artery: Secondary | ICD-10-CM | POA: Diagnosis not present

## 2021-12-06 LAB — RENAL FUNCTION PANEL
Albumin: 3.1 g/dL — ABNORMAL LOW (ref 3.5–5.0)
Anion gap: 14 (ref 5–15)
BUN: 105 mg/dL — ABNORMAL HIGH (ref 8–23)
CO2: 27 mmol/L (ref 22–32)
Calcium: 8.6 mg/dL — ABNORMAL LOW (ref 8.9–10.3)
Chloride: 87 mmol/L — ABNORMAL LOW (ref 98–111)
Creatinine, Ser: 5.32 mg/dL — ABNORMAL HIGH (ref 0.61–1.24)
GFR, Estimated: 11 mL/min — ABNORMAL LOW (ref >60)
Glucose, Bld: 85 mg/dL (ref 70–99)
Phosphorus: 5.6 mg/dL — ABNORMAL HIGH (ref 2.5–4.6)
Potassium: 3.7 mmol/L (ref 3.5–5.1)
Sodium: 128 mmol/L — ABNORMAL LOW (ref 135–145)

## 2021-12-06 LAB — COOXEMETRY PANEL
Carboxyhemoglobin: 1.8 % — ABNORMAL HIGH (ref 0.5–1.5)
Methemoglobin: 0.7 % (ref 0.0–1.5)
O2 Saturation: 43.5 %
Total hemoglobin: 12.7 g/dL (ref 12.0–16.0)

## 2021-12-06 LAB — GLUCOSE, CAPILLARY
Glucose-Capillary: 112 mg/dL — ABNORMAL HIGH (ref 70–99)
Glucose-Capillary: 60 mg/dL — ABNORMAL LOW (ref 70–99)
Glucose-Capillary: 63 mg/dL — ABNORMAL LOW (ref 70–99)
Glucose-Capillary: 172 mg/dL — ABNORMAL HIGH (ref 70–99)
Glucose-Capillary: 177 mg/dL — ABNORMAL HIGH (ref 70–99)
Glucose-Capillary: 147 mg/dL — ABNORMAL HIGH (ref 70–99)

## 2021-12-06 LAB — CBC
HCT: 36.1 % — ABNORMAL LOW (ref 39.0–52.0)
Hemoglobin: 12.2 g/dL — ABNORMAL LOW (ref 13.0–17.0)
MCH: 29.3 pg (ref 26.0–34.0)
MCHC: 33.8 g/dL (ref 30.0–36.0)
MCV: 86.6 fL (ref 80.0–100.0)
Platelets: 219 10*3/uL (ref 150–400)
RBC: 4.17 MIL/uL — ABNORMAL LOW (ref 4.22–5.81)
RDW: 14.5 % (ref 11.5–15.5)
WBC: 8.9 10*3/uL (ref 4.0–10.5)
nRBC: 0.4 % — ABNORMAL HIGH (ref 0.0–0.2)

## 2021-12-06 MED ORDER — INSULIN ASPART 100 UNIT/ML IJ SOLN
0.0000 [IU] | Freq: Every day | INTRAMUSCULAR | Status: DC
  Administered 2021-12-12: 1 [IU] via SUBCUTANEOUS

## 2021-12-06 MED ORDER — POTASSIUM CHLORIDE CRYS ER 20 MEQ PO TBCR
20.0000 meq | EXTENDED_RELEASE_TABLET | Freq: Once | ORAL | Status: AC
Start: 2021-12-06 — End: 2021-12-06
  Administered 2021-12-06: 20 meq via ORAL
  Filled 2021-12-06: qty 1

## 2021-12-06 MED ORDER — METOLAZONE 2.5 MG PO TABS
2.5000 mg | ORAL_TABLET | Freq: Once | ORAL | Status: AC
Start: 2021-12-06 — End: 2021-12-06
  Administered 2021-12-06: 2.5 mg via ORAL
  Filled 2021-12-06: qty 1

## 2021-12-06 MED ORDER — INSULIN ASPART 100 UNIT/ML IJ SOLN
0.0000 [IU] | Freq: Three times a day (TID) | INTRAMUSCULAR | Status: DC
  Administered 2021-12-06 – 2021-12-07 (×4): 2 [IU] via SUBCUTANEOUS
  Administered 2021-12-07: 1 [IU] via SUBCUTANEOUS
  Administered 2021-12-08: 2 [IU] via SUBCUTANEOUS
  Administered 2021-12-08 (×2): 1 [IU] via SUBCUTANEOUS
  Administered 2021-12-09: 2 [IU] via SUBCUTANEOUS
  Administered 2021-12-09: 1 [IU] via SUBCUTANEOUS
  Administered 2021-12-09: 2 [IU] via SUBCUTANEOUS
  Administered 2021-12-10: 1 [IU] via SUBCUTANEOUS
  Administered 2021-12-10: 2 [IU] via SUBCUTANEOUS
  Administered 2021-12-11: 1 [IU] via SUBCUTANEOUS
  Administered 2021-12-11: 2 [IU] via SUBCUTANEOUS
  Administered 2021-12-12: 1 [IU] via SUBCUTANEOUS
  Administered 2021-12-12 (×2): 2 [IU] via SUBCUTANEOUS
  Administered 2021-12-13 – 2021-12-14 (×3): 1 [IU] via SUBCUTANEOUS

## 2021-12-06 MED ORDER — NOREPINEPHRINE 4 MG/250ML-% IV SOLN
8.0000 ug/min | INTRAVENOUS | Status: DC
  Administered 2021-12-06 – 2021-12-07 (×2): 3 ug/min via INTRAVENOUS
  Administered 2021-12-07: 5 ug/min via INTRAVENOUS
  Administered 2021-12-08 – 2021-12-14 (×19): 8 ug/min via INTRAVENOUS
  Filled 2021-12-06 (×21): qty 250

## 2021-12-06 MED ORDER — PREDNISONE 20 MG PO TABS
40.0000 mg | ORAL_TABLET | Freq: Every day | ORAL | Status: AC
  Administered 2021-12-06 – 2021-12-08 (×3): 40 mg via ORAL
  Filled 2021-12-06 (×3): qty 2

## 2021-12-06 NOTE — Progress Notes (Addendum)
Patient ID: Steve Salazar, male   DOB: 1951/03/21, 71 y.o.   MRN: [id number]     Advanced Heart Failure Rounding Note  PCP-Cardiologist: [name], MD   Subjective:    7/31: Milrinone d/c 2/2 hypotension. Transitioned to DBA 5 + Lasix gtt at 15/hr. Midodrine added.  8/1: Co-ox 48%, DBA increased to 7.5. Metolazone added to augment diuresis  8/2: Lasix gtt increased to 20/hr + 2.5 metolazone  8/3: NE weaned off. Lasix gtt off. Started PO torsemide.  8/5: NE restarted 8/7: Started on lasix drip at 30. Given tolvaptan.   CO-OX 44% on DBA 7.5. Remains off NE. MAP 70s.  CVP 25, 2.2L UOP with lasix gtt at 30/hr. Received Tolvaptan 08/07 and 08/08, Na 128 today.   Scr plateaued, 4.45>4.7>5.14>5.32>5.46>5.32.   Denies dyspnea. Ambulated in unit yesterday. Unable to bear weight d/t gout flare right great toe.    Objective:   Weight Range: 88.7 kg Body mass index is 26.52 kg/m.   Vital Signs:   Temp:  [96.7 F (35.9 C)-97.8 F (36.6 C)] 97.7 F (36.5 C) (08/08 1607) Pulse Rate:  [52-94] 94 (08/09 0700) Resp:  [0-37] 18 (08/09 0700) BP: (75-105)/(59-80) 81/64 (08/09 0630) SpO2:  [77 %-99 %] 93 % (08/09 0700) Weight:  [88.7 kg] 88.7 kg (08/09 0500) Last BM Date : 12/04/21  Weight change: Filed Weights   12/04/21 0500 12/05/21 0500 12/06/21 0500  Weight: 90.6 kg 88.9 kg 88.7 kg    Intake/Output:   Intake/Output Summary (Last 24 hours) at 12/06/2021 0704 Last data filed at 12/06/2021 0600 Gross per 24 hour  Intake 1127.35 ml  Output 2240 ml  Net -1112.65 ml      Physical Exam    General:  Sitting up in bed. No distress. HEENT: normal Neck: supple. JVP to jaw. Carotids 2+ bilat; no bruits.  Cor: PMI nondisplaced. Irregular rate & rhythm. No rubs, gallops or murmurs. Lungs: clear Abdomen: soft, nontender, nondistended.  Extremities: no cyanosis, clubbing, rash, 1+ edema, + TED hose Neuro: expressive aphasia. Affect pleasant.  Telemetry  AF, BiV paced,  90s  Labs    CBC Recent Labs    12/05/21 0346 12/06/21 0434  WBC 10.0 8.9  HGB 12.6* 12.2*  HCT 36.7* 36.1*  MCV 85.7 86.6  PLT 225 219   Basic Metabolic Panel Recent Labs    75/64/33 0346 12/06/21 0434  NA 126* 128*  K 3.9 3.7  CL 87* 87*  CO2 24 27  GLUCOSE 109* 85  BUN 102* 105*  CREATININE 5.46* 5.32*  CALCIUM 8.7* 8.6*  PHOS 5.4* 5.6*   Liver Function Tests Recent Labs    12/05/21 0346 12/06/21 0434  ALBUMIN 3.1* 3.1*    No results for input(s): "LIPASE", "AMYLASE" in the last 72 hours. Cardiac Enzymes No results for input(s): "CKTOTAL", "CKMB", "CKMBINDEX", "TROPONINI" in the last 72 hours.  BNP: BNP (last 3 results) No results for input(s): "BNP" in the last 8760 hours.  ProBNP (last 3 results) No results for input(s): "PROBNP" in the last 8760 hours.   D-Dimer No results for input(s): "DDIMER" in the last 72 hours. Hemoglobin A1C No results for input(s): "HGBA1C" in the last 72 hours.  Fasting Lipid Panel No results for input(s): "CHOL", "HDL", "LDLCALC", "TRIG", "CHOLHDL", "LDLDIRECT" in the last 72 hours.  Thyroid Function Tests No results for input(s): "TSH", "T4TOTAL", "T3FREE", "THYROIDAB" in the last 72 hours.  Invalid input(s): "FREET3"  Other results:   Imaging    No results found.  Medications:     Scheduled Medications:  amiodarone  200 mg Oral BID   apixaban  5 mg Oral BID   Chlorhexidine Gluconate Cloth  6 each Topical Daily   colchicine  0.3 mg Oral Daily   feeding supplement  237 mL Oral TID BM   insulin aspart  0-15 Units Subcutaneous Q4H   midodrine  10 mg Oral Q8H   oxybutynin  5 mg Oral TID   simethicone  80 mg Oral QID    Infusions:  sodium chloride     sodium chloride Stopped (12/04/21 1213)   sodium chloride     sodium chloride     DOBUTamine 7.5 mcg/kg/min (12/06/21 0600)   furosemide (LASIX) 200 mg in dextrose 5 % 100 mL (2 mg/mL) infusion 30 mg/hr (12/06/21 0600)    PRN  Medications: Place/Maintain arterial line **AND** sodium chloride, Place/Maintain arterial line **AND** sodium chloride, acetaminophen **OR** acetaminophen (TYLENOL) oral liquid 160 mg/5 mL **OR** acetaminophen, alum & mag hydroxide-simeth, mouth rinse, senna-docusate    Assessment/Plan   1. CVA: Acute left MCA CVA, likely cardioembolic.  Has dilated cardiomyopathy, also chronic atrial fibrillation. Echo did not show LV thrombus.  He is now s/p thrombectomy with residual small SAH. Per family, he probably was not taking his Eliquis at home.  C/w expressive aphasia, also aspiration risk (on full liquids currently).  - Heparin switched to Eliquis (not a drug failure as he apparently was not taking it). 2. Atrial fibrillation: Chronic, s/p AV nodal ablation.  Had successful DCCV in 6/23 but now back in AF.  - Eliquis 5 mg bid   3. AKI on CKD stage 3: Baseline creatinine 1.8-2 range, suspect cardiorenal syndrome with superimposed ATN from hypotension earlier in hospitalization and possible CIN contribution. Creatinine plateaued, now starting to trend back down.  Scr 5.32 this am. Better UOP on Lasix gtt 30 mg/hr. Continue today and give 2.5 mg metolazone.  - Continue inotropic support but suspect we are nearing a palliative situation w/ end-stage HF. Not good candidate for long-term HD.   - Continue high dose Lasix gtt. No good end point for CVVH (poor iHD candidate with end-stage CHF).  4. 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  Last cath in 12/21 with normal coronaries.  Echo this admission with EF 20%, severe LV dilation, severe biatrial enlargement, moderate RV enlargement with mildly decreased systolic function, mod-severe MR, moderate TR, dilated IVC.  Has St Jude CRT-D device.  He has known low output/end stage HF, was started on milrinone gtt 0.2 at [facility] in 12/22.  He so far has refused LVAD.  He had been off milrinone initially after admission, co-ox was 40% with  AKI.  Milrinone restarted 7/29 at 0.375 and NE added to maintain MAP (had been on vasopressin). Milrinone later discontinued due to soft BPs. Now on DBA 7.5. CO-OX 44%. Add back NE at fixed dose of 3 today to support CO. CVP 25.  I/Os net negative yesterday. Continues on lasix gtt at 30/hr. Give metolazone 2.5 today. - If this is unsuccessful, only way to decongest would be CVVH, but as above, there does not appear to be an end point for this. - There is consideration in notes about barostimulator placement, however do not think this would be helpful with such advanced HF (inotrope-dependent).  - Patient has refused LVAD in the past, now with large left MCA CVA.  He is not currently an LVAD candidate with the acute  CVA (will need to see functional recovery), and given apparent lack of compliance with apixaban would be concerned about his compliance with warfarin if an LVAD were implanted. Now with rising creatinine, we are heading in the wrong direction.  Palliative care is following. Appears to be moving towards a palliative/hospice situation given MSOF. Now DNR. 5. Mitral regurgitation: Moderate-severe functional MR.  LVESD > 7 cm, would not be Mitraclip candidate.  6. PVCs/NSVT: Continue amiodarone po.  - keep K > 4.0 and Mg > 2.0   7. Hyponatremia: Hypervolemic hyponatremia.  - Received Tolvaptan 08/07 and 08/08 - Na improved to 128 today 8. Gout: Flare left great toe.  - Prednisone course  GOC: Long-term prognosis remains poor, would not be iHD candidate and not LVAD candidate.  Palliative Care following. He is now DNR. Family considering Hospice Home in [place]. Plan to discharge on palliative Dobutamine. Will need to see if he will be accepted with home inotrope.   FINCH, LINDSAY N, PA-C 12/06/2021 7:04 AM  Patient seen with PA, agree with the above note.   Patient's main complaint this morning is gout flare left great toe.  Denies dyspnea.   CVP 25 this morning with I/Os net  negative 1113 on Lasix gtt 30 mg/hr.  Creatinine has plateaued at 5.32.  MAP 70s. Co-ox 44%. He remains on dobutamine 7.5, off NE.    General: NAD Neck: JVP 16+, no thyromegaly or thyroid nodule.  Lungs: Clear to auscultation bilaterally with normal respiratory effort. CV: Nondisplaced PMI.  Heart regular S1/S2, no S3/S4, no murmur.  Trace ankle edema.   Abdomen: Soft, nontender, no hepatosplenomegaly, no distention.  Skin: Intact without lesions or rashes.  Neurologic: Alert and oriented x 3.  Psych: Normal affect. Extremities: No clubbing or cyanosis.  HEENT: Normal.   Renal function has hit a plateau.  He is not an HD/CVVH candidate.  Making more urine but CVP still 25. - Continue Lasix gtt 30 mg/hr and will give a dose of metolazone 2.5 x 1.   Continue dobutamine 7.5.  With low co-ox at 44%, will add low dose fixed NE 3 today to try to support renal function and facilitate diuresis.   Gout flare, prednisone burst to start.   He is now DNR.  Family not interested in hospice/comfort care.  Our hope at this point will be to stabilize him to get him home with home dobutamine infusion.  Not LVAD or HD candidate.   CRITICAL CARE Performed by: Marca Ancona  Total critical care time: 40 minutes  Critical care time was exclusive of separately billable procedures and treating other patients.  Critical care was necessary to treat or prevent imminent or life-threatening deterioration.  Critical care was time spent personally by me on the following activities: development of treatment plan with patient and/or surrogate as well as nursing, discussions with consultants, evaluation of patient's response to treatment, examination of patient, obtaining history from patient or surrogate, ordering and performing treatments and interventions, ordering and review of laboratory studies, ordering and review of radiographic studies, pulse oximetry and re-evaluation of patient's condition.  Marca Ancona. 12/06/2021 7:43 AM

## 2021-12-06 NOTE — Progress Notes (Signed)
Occupational Therapy Treatment Patient Details Name: Steve Salazar MRN: [id number] DOB: June 20, 1950 Today's Date: 12/06/2021   History of present illness 71 y.o. male admitted to [facility] 11/21/2021 with acute aphasia, transfered to [facility] for thrombectomy of acute left M2 occlusion complicated with small amount of subarachnoid hemorrhage. PMHx:Gout, CKD2, NICM, HF with EF 15-20%, HTN, PSVT, ICD, Afib   OT comments  Patient continues to progress toward patient focused goals.  He is needing generalized supervision for stand grooming and closer to [facility] for lower body ADL.  Patient able to walk the halls this session with stable vitals and no complaints.  Patient appears to be more attentive to his right visual field and coordination seems to be better.  OT will continue efforts, with SNF recommended for post acute rehab prior to returning home.     Recommendations for follow up therapy are one component of a multi-disciplinary discharge planning process, led by the attending physician.  Recommendations may be updated based on patient status, additional functional criteria and insurance authorization.    Follow Up Recommendations  Skilled nursing-short term rehab (<3 hours/day)    Assistance Recommended at Discharge Frequent or constant Supervision/Assistance  Patient can return home with the following  A lot of help with walking and/or transfers;A lot of help with bathing/dressing/bathroom;Assistance with cooking/housework;Assistance with feeding;Help with stairs or ramp for entrance;Assist for transportation;Direct supervision/assist for medications management;Direct supervision/assist for financial management   Equipment Recommendations  None recommended by OT    Recommendations for Other Services      Precautions / Restrictions Precautions Precautions: Fall Precaution Comments: aphasia, R inattention Restrictions Weight Bearing Restrictions: No       Mobility Bed Mobility                General bed mobility comments: in recliner on arrival and end of session    Transfers Overall transfer level: Needs assistance Equipment used: None Transfers: Sit to/from Stand Sit to Stand: Supervision                 Balance   Sitting-balance support: No upper extremity supported Sitting balance-Leahy Scale: Normal     Standing balance support: Bilateral upper extremity supported Standing balance-Leahy Scale: Good                             ADL either performed or assessed with clinical judgement   ADL   Eating/Feeding: Independent;Sitting   Grooming: Supervision/safety;Sitting           Upper Body Dressing : Set up;Sitting   Lower Body Dressing: Min guard;Sit to/from stand   Toilet Transfer: Supervision/safety;Ambulation;Rolling walker (2 wheels)                  Extremity/Trunk Assessment Upper Extremity Assessment Upper Extremity Assessment: Overall WFL for tasks assessed RUE Coordination: decreased fine motor LUE Coordination: decreased fine motor       Cervical / Trunk Assessment Cervical / Trunk Assessment: Normal    Vision       Perception     Praxis      Cognition Arousal/Alertness: Awake/alert Behavior During Therapy: WFL for tasks assessed/performed Overall Cognitive Status: Impaired/Different from baseline  Pertinent Vitals/ Pain       Pain Assessment Pain Assessment: No/denies pain Pain Location: indicating pain from foley catheter Pain Descriptors / Indicators: Aching, Grimacing, Discomfort Pain Intervention(s): Monitored during session                                                          Frequency  Min 2X/week        Progress Toward Goals  OT Goals(current goals can now be found in the care plan section)  Progress towards OT goals: Progressing toward goals  Acute Rehab OT  Goals OT Goal Formulation: With patient Time For Goal Achievement: 12/22/21 Potential to Achieve Goals: Good  Plan Discharge plan remains appropriate    Co-evaluation                 AM-PAC OT "6 Clicks" Daily Activity     Outcome Measure   Help from another person eating meals?: None Help from another person taking care of personal grooming?: None Help from another person toileting, which includes using toliet, bedpan, or urinal?: A Little Help from another person bathing (including washing, rinsing, drying)?: A Little Help from another person to put on and taking off regular upper body clothing?: None Help from another person to put on and taking off regular lower body clothing?: A Little 6 Click Score: 21    End of Session Equipment Utilized During Treatment: Rolling walker (2 wheels)  OT Visit Diagnosis: Unsteadiness on feet (R26.81);Other abnormalities of gait and mobility (R26.89);Low vision, both eyes (H54.2);Other symptoms and signs involving cognitive function;Cognitive communication deficit (R41.841) Symptoms and signs involving cognitive functions: Other Nontraumatic ICH   Activity Tolerance Patient tolerated treatment well   Patient Left in chair;with call bell/phone within reach;with nursing/sitter in room   Nurse Communication Mobility status        Time: 6568-1275 OT Time Calculation (min): 19 min  Charges: OT General Charges $OT Visit: 1 Visit OT Treatments $Therapeutic Activity: 8-22 mins  12/06/2021  RP, OTR/L  Acute Rehabilitation Services  Office:  [phone]   Suzanna Obey 12/06/2021, 1:48 PM

## 2021-12-06 NOTE — Progress Notes (Signed)
NAME:  Steve Salazar, MRN:  [id number], DOB:  10/09/1950, LOS: 13 ADMISSION DATE:  11/11/2021, CONSULTATION DATE: Dec 23, 2021 REFERRING MD: Dr. 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  Patient remained hypotensive, required initiation of vasopressor, PCCM was consulted for help evaluation and medical management  Pertinent  Medical History   Past Medical History:  Diagnosis Date   CKD (chronic kidney disease), stage II    COVID-19 virus infection 03/2019   HFrEF (heart failure with reduced ejection fraction) (HCC)    a. 2011 Echo: EF 25-30%; b. 10/2016 Echo: EF 20-25%; c. 11/2019 Echo: EF 20-25%, mild LVH, Gr2 DD, sev dil LA, mod dil RA, mod MR, mild to mod AI, mod PR, Asc Ao 71mm.   Hypertension    NICM (nonischemic cardiomyopathy) (HCC)    a. 2011 Echo: EF 25-30%; b. 2011 MV: basal to dist inf perfusion defect->atten; c. 10/2016 Echo: EF 20-25%; d. 11/2019 Echo: EF 20-25%; e. 11/2019 MV: No ischemia. Fixed inflat defect, inflat HK, EF 17%; f. 03/2020 Cath: Nl cors. CO/CI 4.07/1.92.   PSVT (paroxysmal supraventricular tachycardia) (HCC)    a. 12/2016 Event monitor: rare, short episodes of SVT.    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 Renal function and LFTs both elevated   Interim History / Subjective:   Having a gout flare. Remains on dobutamine, Bps borderline.  Objective   Blood pressure (!) 81/64, pulse 94, temperature 97.7 F (36.5 C), temperature source Oral, resp. rate 18, height 6' (1.829 m), weight 88.7 kg, SpO2 93 %. CVP:  [0 mmHg-37 mmHg] 18 mmHg      Intake/Output Summary (Last 24 hours) at 12/06/2021 0729 Last data filed at 12/06/2021 0600 Gross per 24 hour  Intake 1127.35 ml  Output 2240 ml  Net -1112.65 ml    Filed Weights   12/04/21 0500 12/05/21 0500 12/06/21 0500  Weight: 90.6 kg 88.9 kg 88.7 kg    Examination: No distress Mild aphasia Mild weakness on R persistent Lungs clear MMM, trachea midline Mild edema  SvO2 43% on dobut 7.5 BUN and Cr seemed to have plateau'd CBC okay  Resolved Hospital Problem list   Post thrombectomy subarachnoid hemorrhage  Assessment & Plan:  Acute left MCA stroke s/p thrombectomy Dysphagia due to stroke Etiology cardioembolic in the setting of afib one eliquis and cardiomyopathy with low EF.  Residual deficits include aphasia and slight R sided weakness both improved - Continue secondary stroke prophylaxis, dysphagia diet - Continue mobilization efforts  Acute on chronic systolic CHF with cardiogenic shock, cardiorenal syndrome Shock liver Baseline inotrope dependent NICM Chronic atrial fibrillation- currently appears to be pacer dependent with frequent ectopy Hypervolemic hyponatremia- due to deranged ADH axis; improved with vaptan - Inotrope and diuretic titration per CHF team - Not HD candidate - To try fixed dose levophed as secondline inotrope to try to push kidney perfusion - Continue GOC discussions, appreciate palliative help - Continue eliquis  Best Practice (right  click and "Reselect all SmartList Selections" daily)   Diet/type: Dysphagia diet DVT prophylaxis: DOAC GI prophylaxis: N/A Lines: yes and it is still needed as port Foley: N/A Code Status:  full code Last date of multidisciplinary goals of  care discussion: see palliative notes  31 min cc time Myrla Halsted MD PCCM [facility] See Amion for pager If no response to pager, please call [phone] until 7pm After 7pm, Please call E-link [phone]

## 2021-12-06 NOTE — Progress Notes (Signed)
Palliative:  No family currently at bedside. Steve Salazar is resting comfortably. No significant change or decline. Discussed with RN. Goals are clear for now as sons have elected DNR status but wish to continue full scope treatment otherwise. They will consider comfort care if he were to have acute decline or symptom burden. Discussed also with Anna Genre, PA.   No charge  Yong Channel, NP Palliative Medicine Team Pager [phone] (Please see [email or web address] for schedule) Team Phone [phone]

## 2021-12-06 NOTE — Progress Notes (Signed)
Patient ID: DONNELL WION, male   DOB: Jul 20, 1950, 71 y.o.   MRN: [id number] S:Complaining of bilateral foot pain and thinks its gout. O:BP (!) 89/69   Pulse 97   Temp 98.7 F (37.1 C) (Oral)   Resp 18   Ht 6' (1.829 m)   Wt 88.7 kg   SpO2 93%   BMI 26.52 kg/m   Intake/Output Summary (Last 24 hours) at 12/06/2021 0953 Last data filed at 12/06/2021 0900 Gross per 24 hour  Intake 1118.47 ml  Output 2205 ml  Net -1086.53 ml   Intake/Output: I/O last 3 completed shifts: In: 1482.5 [P.O.:510; I.V.:972.5] Out: 3765 [Urine:3765]  Intake/Output this shift:  Total I/O In: 319.4 [P.O.:240; I.V.:79.4] Out: 125 [Urine:125] Weight change: -0.2 kg Gen: NAD CVS: RRR Resp:CTA Abd: +BS, soft, NT/ND Ext: 1+ pitting edema BLE  Recent Labs  Lab 11/30/21 0355 11/30/21 1619 12/01/21 0340 12/02/21 0527 12/03/21 0404 12/04/21 0408 12/04/21 1751 12/05/21 0346 12/06/21 0434  NA 127* 129* 127* 129* 125* 123* 125* 126* 128*  K 3.5 3.6 3.5 3.2* 3.7 4.2  --  3.9 3.7  CL 92* 91* 90* 89* 88* 89*  --  87* 87*  CO2 24 23 23 24 24 23   --  24 27  GLUCOSE 104* 95 100* 79 133* 104*  --  109* 85  BUN 55* 66* 66* 75* 85* 93*  --  102* 105*  CREATININE 3.62* 4.12* 4.45* 4.87* 5.14* 5.32*  --  5.46* 5.32*  ALBUMIN 2.9*  --   --   --   --   --   --  3.1* 3.1*  CALCIUM 8.5* 8.8* 8.7* 8.6* 8.5* 8.6*  --  8.7* 8.6*  PHOS 6.5*  --   --   --   --   --   --  5.4* 5.6*  AST 44*  --   --   --   --   --   --   --   --   ALT 73*  --   --   --   --   --   --   --   --    Liver Function Tests: Recent Labs  Lab 11/30/21 0355 12/05/21 0346 12/06/21 0434  AST 44*  --   --   ALT 73*  --   --   ALKPHOS 58  --   --   BILITOT 2.5*  --   --   PROT 5.7*  --   --   ALBUMIN 2.9* 3.1* 3.1*   No results for input(s): "LIPASE", "AMYLASE" in the last 168 hours. No results for input(s): "AMMONIA" in the last 168 hours. CBC: Recent Labs  Lab 12/02/21 0527 12/03/21 0404 12/04/21 0408 12/05/21 0346 12/06/21 0434   WBC 7.6 8.5 10.1 10.0 8.9  HGB 12.8* 12.5* 12.8* 12.6* 12.2*  HCT 38.3* 37.1* 37.8* 36.7* 36.1*  MCV 86.1 85.3 85.7 85.7 86.6  PLT 192 206 228 225 219   Cardiac Enzymes: No results for input(s): "CKTOTAL", "CKMB", "CKMBINDEX", "TROPONINI" in the last 168 hours. CBG: Recent Labs  Lab 12/05/21 1941 12/05/21 2316 12/06/21 0334 12/06/21 0738 12/06/21 0803  GLUCAP 112* 144* 112* 63* 60*    Iron Studies: No results for input(s): "IRON", "TIBC", "TRANSFERRIN", "FERRITIN" in the last 72 hours. Studies/Results: No results found.  amiodarone  200 mg Oral BID   apixaban  5 mg Oral BID   Chlorhexidine Gluconate Cloth  6 each Topical Daily   colchicine  0.3 mg Oral  Daily   feeding supplement  237 mL Oral TID BM   insulin aspart  0-5 Units Subcutaneous QHS   insulin aspart  0-9 Units Subcutaneous TID WC   midodrine  10 mg Oral Q8H   oxybutynin  5 mg Oral TID   predniSONE  40 mg Oral Q breakfast   simethicone  80 mg Oral QID    BMET    Component Value Date/Time   NA 128 (L) 12/06/2021 0434   NA 143 08/23/2020 1522   NA 141 07/27/2014 0729   K 3.7 12/06/2021 0434   K 4.0 07/27/2014 0729   CL 87 (L) 12/06/2021 0434   CL 107 07/27/2014 0729   CO2 27 12/06/2021 0434   CO2 27 07/27/2014 0729   GLUCOSE 85 12/06/2021 0434   GLUCOSE 125 (H) 07/27/2014 0729   BUN 105 (H) 12/06/2021 0434   BUN 18 08/23/2020 1522   BUN 16 07/27/2014 0729   CREATININE 5.32 (H) 12/06/2021 0434   CREATININE 1.31 (H) 07/27/2014 0729   CALCIUM 8.6 (L) 12/06/2021 0434   CALCIUM 8.9 07/27/2014 0729   GFRNONAA 11 (L) 12/06/2021 0434   GFRNONAA 57 (L) 07/27/2014 0729   GFRAA 75 03/14/2020 1515   GFRAA >60 07/27/2014 0729   CBC    Component Value Date/Time   WBC 8.9 12/06/2021 0434   RBC 4.17 (L) 12/06/2021 0434   HGB 12.2 (L) 12/06/2021 0434   HGB 12.9 (L) 03/14/2020 1515   HCT 36.1 (L) 12/06/2021 0434   HCT 40.2 03/14/2020 1515   PLT 219 12/06/2021 0434   PLT 162 03/14/2020 1515   MCV 86.6  12/06/2021 0434   MCV 94 03/14/2020 1515   MCV 88 07/27/2014 0729   MCH 29.3 12/06/2021 0434   MCHC 33.8 12/06/2021 0434   RDW 14.5 12/06/2021 0434   RDW 13.2 03/14/2020 1515   RDW 15.4 (H) 07/27/2014 0729   LYMPHSABS 1.7 11/14/2021 1617   LYMPHSABS 1.0 03/14/2020 1515   MONOABS 0.8 11/18/2021 1617   EOSABS 0.0 11/15/2021 1617   EOSABS 0.1 03/14/2020 1515   BASOSABS 0.1 11/26/2021 1617   BASOSABS 0.1 03/14/2020 1515      Assessment/Plan:   AKI/CKD stage IIIa - in the setting of decompensated CHF.  He has had several episodes due to cardiorenal syndrome and has had progressive CKD.  He is not a candidate for HD given his EF of 15% and failure on home milrinone and not a candidate for LVAD due to his recent stroke.  I discussed this with he and his sister and brother who were at the bedside as well as Dr. Shirlee Latch of the heart failure team who are in agreement.  Mr. Granito and his family understand this but wants to continue with pressors and diuretics for now.  No RRT. Na improving as well as stable Scr.  Nothing further to add. Will sign off.  Please call with questions or concerns.  Acute left MCA CVA - likely cardioembolic due to atrial fibrillation.  S/p thrombectomy with residual small SAH.   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  Failed home milrinone.  Now on levophed and dobutamine.  Poor response to high dose IV lasix 160 mg bid yesterday. +JVD.  Discussed with Dr. Shirlee Latch about lasix drip and IV albumin.  Some improved UOP overnight and was negative 1 L.  Still up 3.5L.  Poor overall prognosis but pt remains full code. Hypervolemic hyponatremia - improving with lasix drip.  Continue with IV diuresis.  Currently asymptomatic Atrial fibrillation - recurrent.  On Eliquis. Disposition - end stage NICM and not a candidate for HD or LVAD.  Palliative care following and now DNR.  Poor overall prognosis    Donetta Potts, MD [facility]

## 2021-12-06 NOTE — TOC Progression Note (Signed)
Transition of Care [facility]) - Progression Note    Patient Details  Name: DRESHON PROFFIT MRN: [id number] Date of Birth: 1951/04/05  Transition of Care [facility]) CM/SW Contact  Graves-Bigelow, Lamar Laundry, RN Phone Number: 12/06/2021, 3:02 PM  Clinical Narrative: Case Manager received a consult for home Dobutamine. Case Manager called patients sister and she stated that the patient has no family support to return home. The friend that he lives with works. Family has made the decision to have the patient a DNR. Family has not decided for hospice at this time. Case Manager did check with [facility]; however, the [facility] Liaison did not check with insurance due to poor prognosis- felt like insurance would not approve. Case Manager and CSW did call [facility] and this facility can accept patients with Dobutamine. [facility] is another SNF that can accept Dobutamine. Family has to be agreeable to fax to [facility]- they wanted a SNF in [place] and [facility] offered a bed; however, they cannot manage the IV Dobutamine at this time. Case Manager/CSW will discuss with family regarding the above facilities to see if this is an option. Case Manager did relay information to PA Anna Genre, staff RN, and HF Case Manager.   Expected Discharge Plan: Skilled Nursing Facility Barriers to Discharge: SNF Pending bed offer  Expected Discharge Plan and Services Expected Discharge Plan: Skilled Nursing Facility     Post Acute Care Choice: Skilled Nursing Facility Living arrangements for the past 2 months: Single Family Home                    Readmission Risk Interventions     No data to display

## 2021-12-06 NOTE — Progress Notes (Signed)
Inpatient Diabetes Program Recommendations  AACE/ADA: New Consensus Statement on Inpatient Glycemic Control (2015)  Target Ranges:  Prepandial:   less than 140 mg/dL      Peak postprandial:   less than 180 mg/dL (1-2 hours)      Critically ill patients:  140 - 180 mg/dL   Lab Results  Component Value Date   GLUCAP 60 (L) 12/06/2021   HGBA1C 5.5 11/08/2021    Review of Glycemic Control  Latest Reference Range & Units 12/05/21 23:16 12/06/21 03:34 12/06/21 07:38 12/06/21 08:03  Glucose-Capillary 70 - 99 mg/dL 491 (H) 791 (H) 63 (L) 60 (L)   Diabetes history: DM 2 Outpatient Diabetes medications:  Jardiance 10 mg daily Current orders for Inpatient glycemic control:  Novolog 0-15 units q 4 hours Inpatient Diabetes Program Recommendations:   Please consider reducing Novolog correction to sensitive tid with meals (note lows this morning).   Thanks, Beryl Meager, RN, BC-ADM Inpatient Diabetes Coordinator Pager [phone]  (8a-5p)

## 2021-12-06 NOTE — TOC Progression Note (Addendum)
Transition of Care [facility]) - Progression Note    Patient Details  Name: Steve Salazar MRN: [id number] Date of Birth: 1950/11/06  Transition of Care [facility]) CM/SW Contact  Delilah Shan, LCSWA Phone Number: 12/06/2021, 3:34 PM  Clinical Narrative:     CSW spoke with patients son Casimiro Needle regarding dc plans for patient. CSW informed Casimiro Needle that [facility] unable to accept IV  Dobutamine.CSW informed patients son that [facility] and [facility] would accept. CSW informed patients son that CSW would need permission to fax referral to these facility's to see if they could offer patient SNF bed. Patient son request to speak with his aunt regarding plan for patient and will call CSW back. TOC will continue to follow and assist with patients dc planning needs.  Expected Discharge Plan: Skilled Nursing Facility Barriers to Discharge: SNF Pending bed offer  Expected Discharge Plan and Services Expected Discharge Plan: Skilled Nursing Facility     Post Acute Care Choice: Skilled Nursing Facility Living arrangements for the past 2 months: Single Family Home                                       Social Determinants of Health (SDOH) Interventions    Readmission Risk Interventions     No data to display

## 2021-12-07 DIAGNOSIS — I482 Chronic atrial fibrillation, unspecified: Secondary | ICD-10-CM | POA: Diagnosis not present

## 2021-12-07 DIAGNOSIS — R570 Cardiogenic shock: Secondary | ICD-10-CM | POA: Diagnosis not present

## 2021-12-07 DIAGNOSIS — I5023 Acute on chronic systolic (congestive) heart failure: Secondary | ICD-10-CM | POA: Diagnosis not present

## 2021-12-07 DIAGNOSIS — I63412 Cerebral infarction due to embolism of left middle cerebral artery: Secondary | ICD-10-CM | POA: Diagnosis not present

## 2021-12-07 DIAGNOSIS — I639 Cerebral infarction, unspecified: Secondary | ICD-10-CM | POA: Diagnosis not present

## 2021-12-07 LAB — RENAL FUNCTION PANEL
Albumin: 3.1 g/dL — ABNORMAL LOW (ref 3.5–5.0)
Anion gap: 14 (ref 5–15)
BUN: 117 mg/dL — ABNORMAL HIGH (ref 8–23)
CO2: 27 mmol/L (ref 22–32)
Calcium: 8.7 mg/dL — ABNORMAL LOW (ref 8.9–10.3)
Chloride: 85 mmol/L — ABNORMAL LOW (ref 98–111)
Creatinine, Ser: 5.29 mg/dL — ABNORMAL HIGH (ref 0.61–1.24)
GFR, Estimated: 11 mL/min — ABNORMAL LOW (ref >60)
Glucose, Bld: 169 mg/dL — ABNORMAL HIGH (ref 70–99)
Phosphorus: 5.1 mg/dL — ABNORMAL HIGH (ref 2.5–4.6)
Potassium: 3.9 mmol/L (ref 3.5–5.1)
Sodium: 126 mmol/L — ABNORMAL LOW (ref 135–145)

## 2021-12-07 LAB — COOXEMETRY PANEL
Carboxyhemoglobin: 1.7 % — ABNORMAL HIGH (ref 0.5–1.5)
Methemoglobin: <0.7 % (ref 0.0–1.5)
O2 Saturation: 47.2 %
Total hemoglobin: 12.8 g/dL (ref 12.0–16.0)

## 2021-12-07 LAB — CBC
HCT: 36.8 % — ABNORMAL LOW (ref 39.0–52.0)
Hemoglobin: 12.2 g/dL — ABNORMAL LOW (ref 13.0–17.0)
MCH: 28.8 pg (ref 26.0–34.0)
MCHC: 33.2 g/dL (ref 30.0–36.0)
MCV: 86.8 fL (ref 80.0–100.0)
Platelets: 242 10*3/uL (ref 150–400)
RBC: 4.24 MIL/uL (ref 4.22–5.81)
RDW: 14.6 % (ref 11.5–15.5)
WBC: 10.6 10*3/uL — ABNORMAL HIGH (ref 4.0–10.5)
nRBC: 0.4 % — ABNORMAL HIGH (ref 0.0–0.2)

## 2021-12-07 LAB — GLUCOSE, CAPILLARY
Glucose-Capillary: 166 mg/dL — ABNORMAL HIGH (ref 70–99)
Glucose-Capillary: 148 mg/dL — ABNORMAL HIGH (ref 70–99)
Glucose-Capillary: 125 mg/dL — ABNORMAL HIGH (ref 70–99)
Glucose-Capillary: 165 mg/dL — ABNORMAL HIGH (ref 70–99)
Glucose-Capillary: 167 mg/dL — ABNORMAL HIGH (ref 70–99)

## 2021-12-07 MED ORDER — TOLVAPTAN 15 MG PO TABS
15.0000 mg | ORAL_TABLET | Freq: Once | ORAL | Status: AC
  Administered 2021-12-07: 15 mg via ORAL
  Filled 2021-12-07: qty 1

## 2021-12-07 MED ORDER — METOLAZONE 5 MG PO TABS
5.0000 mg | ORAL_TABLET | Freq: Two times a day (BID) | ORAL | Status: AC
  Administered 2021-12-07 (×2): 5 mg via ORAL
  Filled 2021-12-07 (×2): qty 1

## 2021-12-07 NOTE — Progress Notes (Signed)
Patient BF:Steve Salazar      DOB: 02-05-1951      VBT:[id number]      Palliative Medicine Team    Subjective: Bedside symptom check completed.   Physical exam: Patient sitting up in recliner at time of visit. Breathing even and non-labored, no excessive secretions noted. Patient without physical or non-verbal signs of pain or discomfort at this time. Patient with multiple food and drink items in front of him on his tray.    Assessment and plan: Patient endorses he feels pretty good today, only complaint is in his toe from gout flare-up. He says his breathing feels pretty good and that he has been up in the chair and walking. He reports he has very little appetite, discussed trying to eat little meals/bites throughout day to get calories in for strength. Will continue to follow for any changes or advances throughout his stay.    Thank you for allowing the Palliative Medicine Team to assist in the care of this patient.     Shelda Jakes, MSN, RN Palliative Medicine Team Team Phone: [phone]  This phone is monitored 7a-7p, please reach out to attending physician outside of these hours for urgent needs.

## 2021-12-07 NOTE — Progress Notes (Signed)
Speech Language Pathology Treatment: Dysphagia;Cognitive-Linquistic  Patient Details Name: Steve Salazar MRN: [id number] DOB: 08-Sep-1950 Today's Date: 12/07/2021 Time: 6213-0865 SLP Time Calculation (min) (ACUTE ONLY): 15 min  Assessment / Plan / Recommendation Clinical Impression  Pt demonstrates ongoing disinterest in food. He is able to communicate with non-fluent phrases and gestures to substitute for word finding impairment, that his stomach is hurting and there is pressure. After attempting to masticate a graham cracker pt also reported by the same method that his gums hurt. He refuses to place upper denture. However, no oral holding observed with cracker or with ensure, though pt did need cues to initaite drinking it.  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 He can repeat short phrases. With any increased linguistic complexity, such as two step commands, repeating compound sentences or complex Y/N pt has 0 accuracy. SLP stressed importance of simplifying language for improved comprehension and confirming comprehension with repetition or question. Pt making improvements in fluency and total communication.   HPI HPI: 71 y.o. male presented on 11/03/2021 with expressive aphasia - on work-up noted to have acute left MCA stroke with M2 occlusion, underwent mechanical thrombectomy, with TICI 2C revascularization, complicated with small amount of subarachnoid hemorrhage, acute on chronic CHF with cardiogenic shock on vasopressors, AKI on CKD, metabolic encephalopathy, shock liver. 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      SLP Plan  Continue with current plan of care      Recommendations for follow up therapy are one component of a multi-disciplinary discharge planning process, led by  the attending physician.  Recommendations may be updated based on patient status, additional functional criteria and insurance authorization.    Recommendations  Diet recommendations: Dysphagia 2 (fine chop);Thin liquid Liquids provided via: Cup;Straw Medication Administration: Whole meds with liquid Supervision: Patient able to self feed;Full supervision/cueing for compensatory strategies Compensations: Slow rate;Small sips/bites Postural Changes and/or Swallow Maneuvers: Seated upright 90 degrees                Follow Up Recommendations: Acute inpatient rehab (3hours/day) Assistance recommended at discharge: Frequent or constant Supervision/Assistance Plan: Continue with current plan of care           Merlen Gurry, Riley Nearing  12/07/2021, 3:56 PM

## 2021-12-07 NOTE — Progress Notes (Signed)
NAME:  Steve Salazar, MRN:  [id number], DOB:  [birth year], LOS: 14 ADMISSION DATE:  Dec 13, 2021, CONSULTATION DATE: 11/01/2021 REFERRING MD: Dr. 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  Patient remained hypotensive, required initiation of vasopressor, PCCM was consulted for help evaluation and medical management  Pertinent  Medical History   Past Medical History:  Diagnosis Date   CKD (chronic kidney disease), stage II    COVID-19 virus infection 03/2019   HFrEF (heart failure with reduced ejection fraction) (HCC)    a. 2011 Echo: EF 25-30%; b. 10/2016 Echo: EF 20-25%; c. 11/2019 Echo: EF 20-25%, mild LVH, Gr2 DD, sev dil LA, mod dil RA, mod MR, mild to mod AI, mod PR, Asc Ao 48mm.   Hypertension    NICM (nonischemic cardiomyopathy) (HCC)    a. 2011 Echo: EF 25-30%; b. 2011 MV: basal to dist inf perfusion defect->atten; c. 10/2016 Echo: EF 20-25%; d. 11/2019 Echo: EF 20-25%; e. 11/2019 MV: No ischemia. Fixed inflat defect, inflat HK, EF 17%; f. 03/2020 Cath: Nl cors. CO/CI 4.07/1.92.   PSVT (paroxysmal supraventricular tachycardia) (HCC)    a. 12/2016 Event monitor: rare, short episodes of SVT.    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 Renal function and LFTs both elevated  8/10 stable, fixed dose Levophed  added  Interim History / Subjective:  No complaints today Remains on dobutamine, fixed dose levophed Bps borderline.  Objective   Blood pressure (!) 83/73, pulse 75, temperature 97.7 F (36.5 C), temperature source Oral, resp. rate (!) 23, height 6' (1.829 m), weight 87.7 kg, SpO2 92 %. CVP:  [19 mmHg-26 mmHg] 19 mmHg      Intake/Output Summary (Last 24 hours) at 12/07/2021 0905 Last data filed at 12/07/2021 0800 Gross per 24 hour  Intake 1295.72 ml  Output 1890 ml  Net -594.28 ml    Filed Weights   12/05/21 0500 12/06/21 0500 12/07/21 0500  Weight: 88.9 kg 88.7 kg 87.7 kg    General:  chronically ill-appearing M, resting in no distress HEENT: MM pink/moist, sclera anicteric Neuro: awake, following commands, mild R-sided weakness CV: s1s2 rrr, no m/r/g PULM:  clear bilaterally without tachypnea or accessory muscle use GI: soft, non-tender Extremities: warm/dry, trace edema  Skin: no rashes or lesions  K 3.9 Na 126 Coox 47%  Resolved Hospital Problem list   Post thrombectomy subarachnoid hemorrhage  Assessment & Plan:  Acute left MCA stroke s/p thrombectomy Dysphagia due to stroke Etiology cardioembolic in the setting of afib one eliquis and cardiomyopathy with low EF.  Residual deficits include aphasia and slight R sided weakness both improved - Continue secondary stroke prophylaxis, dysphagia diet - Continue mobilization efforts -Placement efforts per TOC team  Acute on chronic systolic CHF with cardiogenic shock, cardiorenal syndrome Shock liver Baseline inotrope dependent NICM Chronic atrial fibrillation -currently appears to be pacer dependent with frequent ectopy -continue dobutamine and fixed dose levophed   Hypervolemic hyponatremia  due to  deranged ADH axis; improved with vaptan - Inotrope and diuretic titration per CHF team - Not HD candidate - To try fixed dose levophed as secondline inotrope to try to push kidney perfusion - Continue GOC  discussions, appreciate palliative help - Continue eliquis  Best Practice (right click and "Reselect all SmartList Selections" daily)   Diet/type: Dysphagia diet DVT prophylaxis: DOAC GI prophylaxis: N/A Lines: yes and it is still needed as port Foley: trial d/c'ing foley Code Status:  DNR Last date of multidisciplinary goals of care discussion: see palliative notes  CRITICAL CARE Performed by: Darcella Gasman Kourtlyn Charlet   Total critical care time: 35 minutes  Critical care time was exclusive of separately billable procedures and treating other patients.  Critical care was necessary to treat or prevent imminent or life-threatening deterioration.  Critical care was time spent personally by me on the following activities: development of treatment plan with patient and/or surrogate as well as nursing, discussions with consultants, evaluation of patient's response to treatment, examination of patient, obtaining history from patient or surrogate, ordering and performing treatments and interventions, ordering and review of laboratory studies, ordering and review of radiographic studies, pulse oximetry and re-evaluation of patient's condition.   Darcella Gasman Yosgar Demirjian, PA-C [facility] See Amion for pager If no response to pager , please call [phone] until 7pm After 7:00 pm call Elink  [phone]

## 2021-12-07 NOTE — Progress Notes (Signed)
Patient ID: Steve Salazar, male   DOB: 1950/11/04, 71 y.o.   MRN: [id number]     Advanced Heart Failure Rounding Note  PCP-Cardiologist: [name], MD   Subjective:    7/31: Milrinone d/c 2/2 hypotension. Transitioned to DBA 5 + Lasix gtt at 15/hr. Midodrine added.  8/1: Co-ox 48%, DBA increased to 7.5. Metolazone added to augment diuresis  8/2: Lasix gtt increased to 20/hr + 2.5 metolazone  8/3: NE weaned off. Lasix gtt off. Started PO torsemide.  8/5: NE restarted 8/7: Started on lasix drip at 30. Given tolvaptan.   CO-OX 47% on DBA 7.5, NE 3. MAP 70s.  CVP > 20 for nurse, unable to get reading this morning.  1.77 L UOP with lasix gtt at 30/hr. Na 126.   Scr plateaued, 4.45>4.7>5.14>5.32>5.46>5.32>5.29.   No dyspnea, he has been walking.  Pain improved in toe.    Objective:   Weight Range: 87.7 kg Body mass index is 26.22 kg/m.   Vital Signs:   Temp:  [97.2 F (36.2 C)-98.7 F (37.1 C)] 98 F (36.7 C) (08/10 0026) Pulse Rate:  [89-184] 90 (08/10 0500) Resp:  [15-30] 21 (08/10 0500) BP: (71-112)/(58-98) 90/71 (08/10 0500) SpO2:  [87 %-98 %] 95 % (08/10 0500) Weight:  [87.7 kg] 87.7 kg (08/10 0500) Last BM Date : 12/06/21  Weight change: Filed Weights   12/05/21 0500 12/06/21 0500 12/07/21 0500  Weight: 88.9 kg 88.7 kg 87.7 kg    Intake/Output:   Intake/Output Summary (Last 24 hours) at 12/07/2021 0721 Last data filed at 12/07/2021 0500 Gross per 24 hour  Intake 1500.6 ml  Output 1765 ml  Net -264.4 ml      Physical Exam    General: NAD Neck: JVP 16+, no thyromegaly or thyroid nodule.  Lungs: Clear to auscultation bilaterally with normal respiratory effort. CV: Lateral PMI.  Heart regular S1/S2, no S3/S4, no murmur.  1+ edema to knees. Abdomen: Soft, nontender, no hepatosplenomegaly, no distention.  Skin: Intact without lesions or rashes.  Neurologic: Alert and oriented x 3.  Psych: Normal affect. Extremities: No clubbing or cyanosis.  HEENT:  Normal.   Telemetry  AF, BiV paced, 90s  Labs    CBC Recent Labs    12/06/21 0434 12/07/21 0431  WBC 8.9 10.6*  HGB 12.2* 12.2*  HCT 36.1* 36.8*  MCV 86.6 86.8  PLT 219 242   Basic Metabolic Panel Recent Labs    60/10/93 0434 12/07/21 0431  NA 128* 126*  K 3.7 3.9  CL 87* 85*  CO2 27 27  GLUCOSE 85 169*  BUN 105* 117*  CREATININE 5.32* 5.29*  CALCIUM 8.6* 8.7*  PHOS 5.6* 5.1*   Liver Function Tests Recent Labs    12/06/21 0434 12/07/21 0431  ALBUMIN 3.1* 3.1*    No results for input(s): "LIPASE", "AMYLASE" in the last 72 hours. Cardiac Enzymes No results for input(s): "CKTOTAL", "CKMB", "CKMBINDEX", "TROPONINI" in the last 72 hours.  BNP: BNP (last 3 results) No results for input(s): "BNP" in the last 8760 hours.  ProBNP (last 3 results) No results for input(s): "PROBNP" in the last 8760 hours.   D-Dimer No results for input(s): "DDIMER" in the last 72 hours. Hemoglobin A1C No results for input(s): "HGBA1C" in the last 72 hours.  Fasting Lipid Panel No results for input(s): "CHOL", "HDL", "LDLCALC", "TRIG", "CHOLHDL", "LDLDIRECT" in the last 72 hours.  Thyroid Function Tests No results for input(s): "TSH", "T4TOTAL", "T3FREE", "THYROIDAB" in the last 72 hours.  Invalid input(s): "  FREET3"  Other results:   Imaging    No results found.   Medications:     Scheduled Medications:  amiodarone  200 mg Oral BID   apixaban  5 mg Oral BID   Chlorhexidine Gluconate Cloth  6 each Topical Daily   colchicine  0.3 mg Oral Daily   feeding supplement  237 mL Oral TID BM   insulin aspart  0-5 Units Subcutaneous QHS   insulin aspart  0-9 Units Subcutaneous TID WC   metolazone  5 mg Oral BID   midodrine  10 mg Oral Q8H   oxybutynin  5 mg Oral TID   predniSONE  40 mg Oral Q breakfast   simethicone  80 mg Oral QID   tolvaptan  15 mg Oral Once    Infusions:  sodium chloride     sodium chloride Stopped (12/04/21 1213)   sodium chloride      sodium chloride     DOBUTamine 7.5 mcg/kg/min (12/07/21 0500)   furosemide (LASIX) 200 mg in dextrose 5 % 100 mL (2 mg/mL) infusion 30 mg/hr (12/07/21 0500)   norepinephrine (LEVOPHED) Adult infusion 3 mcg/min (12/07/21 0500)    PRN Medications: Place/Maintain arterial line **AND** sodium chloride, Place/Maintain arterial line **AND** sodium chloride, acetaminophen **OR** acetaminophen (TYLENOL) oral liquid 160 mg/5 mL **OR** acetaminophen, alum & mag hydroxide-simeth, mouth rinse, senna-docusate    Assessment/Plan   1. CVA: Acute left MCA CVA, likely cardioembolic.  Has dilated cardiomyopathy, also chronic atrial fibrillation. Echo did not show LV thrombus.  He is now s/p thrombectomy with residual small SAH. Per family, he probably was not taking his Eliquis at home.  C/w expressive aphasia, also aspiration risk (on full liquids currently).  - Heparin switched to Eliquis (not a drug failure as he apparently was not taking it). 2. Atrial fibrillation: Chronic, s/p AV nodal ablation.  Had successful DCCV in 6/23 but now back in AF.  - Eliquis 5 mg bid   3. AKI on CKD stage 3: Baseline creatinine 1.8-2 range, suspect cardiorenal syndrome with superimposed ATN from hypotension earlier in hospitalization and possible CIN contribution. Creatinine plateaued though BUN higher, 117/5.29 today. Some UOP but not vigorous on Lasix gtt 30 mg/hr.  - Continue Lasix gtt 30 mg/hr and will give metolazone 5 mg bid today.  - Continue inotropic support but suspect we are nearing a palliative situation w/ end-stage HF. Not good candidate for long-term HD. No good end point for CVVH (poor iHD candidate with end-stage CHF).  4. 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  Last cath in 12/21 with normal coronaries.  Echo this admission with EF 20%, severe LV dilation, severe biatrial enlargement, moderate RV enlargement with mildly decreased systolic function, mod-severe MR, moderate TR,  dilated IVC.  Has [employer] Jude CRT-D device.  He has known low output/end stage HF, was started on milrinone gtt 0.2 at [facility] in 12/22.  He so far has refused LVAD.  He had been off milrinone initially after admission, co-ox was 40% with AKI.  Milrinone restarted 7/29 at 0.375 and NE added to maintain MAP (had been on vasopressin). Milrinone later discontinued due to soft BPs. Now on DBA 7.5 with NE 3. CO-OX 47%. CVP still > 20 and volume overloaded on exam.  - Continue on lasix gtt at 30 mg/hr. Give metolazone 5 mg bid today.   - With Na 126, will give tolvaptan 15 mg x 1.  - If this is unsuccessful,  only way to decongest would be CVVH, but as above, there does not appear to be an end point for this. - There is consideration in notes about barostimulator placement, however do not think this would be helpful with such advanced HF (inotrope-dependent).  - Patient has refused LVAD in the past, now with large left MCA CVA.  He is not currently an LVAD candidate with the acute CVA (will need to see functional recovery), and given apparent lack of compliance with apixaban would be concerned about his compliance with warfarin if an LVAD were implanted. Now with AKI, we are heading in the wrong direction.  Palliative care is following. Appears to be moving towards a palliative/hospice situation given MSOF. Now DNR. 5. Mitral regurgitation: Moderate-severe functional MR.  LVESD > 7 cm, would not be Mitraclip candidate.  6. PVCs/NSVT: Continue amiodarone po.  - keep K > 4.0 and Mg > 2.0   7. Hyponatremia: Hypervolemic hyponatremia. Received Tolvaptan 08/07 and 08/08 - Will give dose tolvaptan today.  8. Gout: Flare left great toe.  - Prednisone course  GOC: He is now DNR.  Family not interested in hospice/comfort care.  Our hope at this point will be to stabilize him to get him home with home dobutamine infusion.  Not LVAD or HD candidate.   CRITICAL CARE Performed by: Loralie Champagne  Total critical care time: 40  minutes  Critical care time was exclusive of separately billable procedures and treating other patients.  Critical care was necessary to treat or prevent imminent or life-threatening deterioration.  Critical care was time spent personally by me on the following activities: development of treatment plan with patient and/or surrogate as well as nursing, discussions with consultants, evaluation of patient's response to treatment, examination of patient, obtaining history from patient or surrogate, ordering and performing treatments and interventions, ordering and review of laboratory studies, ordering and review of radiographic studies, pulse oximetry and re-evaluation of patient's condition.  Loralie Champagne. 12/07/2021 7:21 AM

## 2021-12-07 NOTE — TOC Progression Note (Addendum)
Transition of Care [facility]) - Progression Note    Patient Details  Name: Steve Salazar MRN: [id number] Date of Birth: February 07, 1951  [facility] CM/SW Contact  Steve Salazar, LCSWA Phone Number: 12/07/2021, 10:18 AM  Clinical Narrative:     UpdateOlegario Salazar with [facility] made SNF bed offer for patient. CSW awaiting determination from [facility]. Steve Salazar informed CSW DON reviewing . CSW informed patients sister. Patients sister informed CSW she would like to go ahead and accept [facility] SNF bed offer for patient. CSW informed MD. CSW informed Steve Salazar with [facility].CSW following to start insurance authorization close to patient being medically ready.   Update-2:16pm  CSW was informed by patients nurse that patients sister Steve Salazar request for CSW to call her regarding patients dc plan. CSW spoke with patients sister Steve Salazar. Steve Salazar gave CSW permission to fax out initial referral for possible SNF placement to [facility] and [facility]. Steve Salazar informed CSW that she will update Steve Salazar patients son. CSW will continue to follow.  CSW followed up with patients son Steve Salazar on patients dc plan. Patients son reports his aunt is going to speak with nurse today and then will call CSW back on plans for dc for patient. CSW will continue to follow.  Expected Discharge Plan: Skilled Nursing Facility Barriers to Discharge: SNF Pending bed offer  Expected Discharge Plan and Services Expected Discharge Plan: Skilled Nursing Facility     Post Acute Care Choice: Skilled Nursing Facility Living arrangements for the past 2 months: Single Family Home                                       Social Determinants of Health (SDOH) Interventions    Readmission Risk Interventions     No data to display

## 2021-12-07 NOTE — Progress Notes (Signed)
Physical Therapy Treatment Patient Details Name: Steve Salazar MRN: [id number] DOB: 18-Feb-1951 Today's Date: 12/07/2021   History of Present Illness 71 y.o. male admitted to [facility] 11/15/2021 with acute aphasia, transfered to [facility] for thrombectomy of acute left M2 occlusion complicated with small amount of subarachnoid hemorrhage. Pt with inotrope dependent HF and not HD or LVAD candidate. PMHx:Gout, CKD2, NICM, HF with EF 15-20%, HTN, PSVT, ICD, Afib    PT Comments    Pt with progressive speech this session able to answer more questions appropriately and provide correct answers when given 2 choices. Pt with increased gait tolerance and balance without LOB during gait and no AD. Continue to attempt conditioning and balance activities but difficult given receptive aphasia. Will continue to follow.   HR 91 SPO2 94% on RA BP 87/71 (78)   Recommendations for follow up therapy are one component of a multi-disciplinary discharge planning process, led by the attending physician.  Recommendations may be updated based on patient status, additional functional criteria and insurance authorization.  Follow Up Recommendations  Skilled nursing-short term rehab (<3 hours/day) Can patient physically be transported by private vehicle: Yes   Assistance Recommended at Discharge Frequent or constant Supervision/Assistance  Patient can return home with the following Assistance with cooking/housework;Direct supervision/assist for medications management;Assist for transportation;Direct supervision/assist for financial management;Help with stairs or ramp for entrance;A little help with walking and/or transfers;A little help with bathing/dressing/bathroom   Equipment Recommendations  None recommended by PT    Recommendations for Other Services       Precautions / Restrictions Precautions Precautions: Fall Precaution Comments: aphasia, R inattention     Mobility  Bed Mobility               General bed  mobility comments: in recliner on arrival and end of session    Transfers Overall transfer level: Needs assistance     Sit to Stand: Supervision           General transfer comment: multimodal cues to stand with assist for line management. Pt performed 5 repeated sit to stands in 20 sec after multimodal cues and counting allowed understanding of repetition.    Ambulation/Gait Ambulation/Gait assistance: Min guard Gait Distance (Feet): 700 Feet Assistive device: None Gait Pattern/deviations: Step-through pattern, Decreased stride length   Gait velocity interpretation: >2.62 ft/sec, indicative of community ambulatory   General Gait Details: slightly decreased stance on left at times stating mild toe pain, decreased attention to right and needing assist to recognize people to right. unable to way find   Stairs             Wheelchair Mobility    Modified Rankin (Stroke Patients Only) Modified Rankin (Stroke Patients Only) Pre-Morbid Rankin Score: No symptoms Modified Rankin: Moderately severe disability     Balance Overall balance assessment: Needs assistance   Sitting balance-Leahy Scale: Normal Sitting balance - Comments: static sitting without assist   Standing balance support: No upper extremity supported Standing balance-Leahy Scale: Good Standing balance comment: pt able to take a step forward with staggered stance but could not achieve heel to toe for tandem stance due to aphasia and inability to follow commands         Rhomberg - Eyes Opened: 60     High Level Balance Comments: unable to perform higher level balance due to receptive aphasia            Cognition Arousal/Alertness: Awake/alert Behavior During Therapy: Flat affect Overall Cognitive Status: Impaired/Different from baseline Area  of Impairment: Safety/judgement, Attention, Problem solving, Memory                   Current Attention Level: Sustained Memory: Decreased  short-term memory Following Commands: Follows one step commands inconsistently, Follows one step commands with increased time, Follows multi-step commands inconsistently Safety/Judgement: Decreased awareness of deficits   Problem Solving: Slow processing, Requires verbal cues, Requires tactile cues General Comments: pt stating name, "cone" and able to correctly pick between 2 choices for month and day. Pt unable to locate room despite multimodal cues and direct visualization of room number. Pt continues to have difficulty with Rt inattention with multimodal cues to follow most commands        Exercises      General Comments        Pertinent Vitals/Pain Pain Assessment Pain Assessment: No/denies pain    Home Living                          Prior Function            PT Goals (current goals can now be found in the care plan section) Progress towards PT goals: Progressing toward goals    Frequency    Min 2X/week      PT Plan Current plan remains appropriate;Frequency needs to be updated    Co-evaluation              AM-PAC PT "6 Clicks" Mobility   Outcome Measure  Help needed turning from your back to your side while in a flat bed without using bedrails?: A Little Help needed moving from lying on your back to sitting on the side of a flat bed without using bedrails?: A Little Help needed moving to and from a bed to a chair (including a wheelchair)?: A Little Help needed standing up from a chair using your arms (e.g., wheelchair or bedside chair)?: A Little Help needed to walk in hospital room?: A Little Help needed climbing 3-5 steps with a railing? : A Little 6 Click Score: 18    End of Session Equipment Utilized During Treatment: Gait belt Activity Tolerance: Patient tolerated treatment well Patient left: in chair;with call bell/phone within reach;with chair alarm set Nurse Communication: Mobility status PT Visit Diagnosis: Other symptoms and  signs involving the nervous system (R29.898);Other abnormalities of gait and mobility (R26.89);Difficulty in walking, not elsewhere classified (R26.2)     Time: 6270-3500 PT Time Calculation (min) (ACUTE ONLY): 28 min  Charges:  $Gait Training: 8-22 mins $Therapeutic Activity: 8-22 mins                     Merryl Hacker, PT Acute Rehabilitation Services Office: [phone]    Enedina Finner Steve Salazar 12/07/2021, 1:41 PM

## 2021-12-08 DIAGNOSIS — I639 Cerebral infarction, unspecified: Secondary | ICD-10-CM | POA: Diagnosis not present

## 2021-12-08 DIAGNOSIS — I5023 Acute on chronic systolic (congestive) heart failure: Secondary | ICD-10-CM | POA: Diagnosis not present

## 2021-12-08 DIAGNOSIS — R570 Cardiogenic shock: Secondary | ICD-10-CM | POA: Diagnosis not present

## 2021-12-08 DIAGNOSIS — I482 Chronic atrial fibrillation, unspecified: Secondary | ICD-10-CM | POA: Diagnosis not present

## 2021-12-08 DIAGNOSIS — Z66 Do not resuscitate: Secondary | ICD-10-CM | POA: Diagnosis not present

## 2021-12-08 DIAGNOSIS — I63412 Cerebral infarction due to embolism of left middle cerebral artery: Secondary | ICD-10-CM | POA: Diagnosis not present

## 2021-12-08 DIAGNOSIS — Z515 Encounter for palliative care: Secondary | ICD-10-CM | POA: Diagnosis not present

## 2021-12-08 LAB — GLUCOSE, CAPILLARY
Glucose-Capillary: 164 mg/dL — ABNORMAL HIGH (ref 70–99)
Glucose-Capillary: 167 mg/dL — ABNORMAL HIGH (ref 70–99)
Glucose-Capillary: 144 mg/dL — ABNORMAL HIGH (ref 70–99)
Glucose-Capillary: 141 mg/dL — ABNORMAL HIGH (ref 70–99)
Glucose-Capillary: 162 mg/dL — ABNORMAL HIGH (ref 70–99)

## 2021-12-08 LAB — RENAL FUNCTION PANEL
Albumin: 3.3 g/dL — ABNORMAL LOW (ref 3.5–5.0)
Anion gap: 16 — ABNORMAL HIGH (ref 5–15)
BUN: 128 mg/dL — ABNORMAL HIGH (ref 8–23)
CO2: 28 mmol/L (ref 22–32)
Calcium: 8.9 mg/dL (ref 8.9–10.3)
Chloride: 82 mmol/L — ABNORMAL LOW (ref 98–111)
Creatinine, Ser: 5.54 mg/dL — ABNORMAL HIGH (ref 0.61–1.24)
GFR, Estimated: 10 mL/min — ABNORMAL LOW (ref >60)
Glucose, Bld: 160 mg/dL — ABNORMAL HIGH (ref 70–99)
Phosphorus: 6 mg/dL — ABNORMAL HIGH (ref 2.5–4.6)
Potassium: 4 mmol/L (ref 3.5–5.1)
Sodium: 126 mmol/L — ABNORMAL LOW (ref 135–145)

## 2021-12-08 LAB — CBC
HCT: 37.3 % — ABNORMAL LOW (ref 39.0–52.0)
Hemoglobin: 12.7 g/dL — ABNORMAL LOW (ref 13.0–17.0)
MCH: 29.6 pg (ref 26.0–34.0)
MCHC: 34 g/dL (ref 30.0–36.0)
MCV: 86.9 fL (ref 80.0–100.0)
Platelets: 225 10*3/uL (ref 150–400)
RBC: 4.29 MIL/uL (ref 4.22–5.81)
RDW: 15.1 % (ref 11.5–15.5)
WBC: 11.7 10*3/uL — ABNORMAL HIGH (ref 4.0–10.5)
nRBC: 0.5 % — ABNORMAL HIGH (ref 0.0–0.2)

## 2021-12-08 LAB — COOXEMETRY PANEL
Carboxyhemoglobin: 0.7 % (ref 0.5–1.5)
Methemoglobin: <0.7 % (ref 0.0–1.5)
O2 Saturation: 39.8 %
Total hemoglobin: 12.7 g/dL (ref 12.0–16.0)

## 2021-12-08 MED ORDER — TOLVAPTAN 15 MG PO TABS
15.0000 mg | ORAL_TABLET | Freq: Once | ORAL | Status: AC
Start: 2021-12-08 — End: 2021-12-08
  Administered 2021-12-08: 15 mg via ORAL
  Filled 2021-12-08: qty 1

## 2021-12-08 MED ORDER — METOLAZONE 5 MG PO TABS
5.0000 mg | ORAL_TABLET | Freq: Two times a day (BID) | ORAL | Status: AC
  Administered 2021-12-08 (×2): 5 mg via ORAL
  Filled 2021-12-08 (×2): qty 1

## 2021-12-08 NOTE — Progress Notes (Signed)
Speech Language Pathology Treatment: Cognitive-Linquistic  Patient Details Name: Steve Salazar MRN: [id number] DOB: 09/27/1950 Today's Date: 12/08/2021 Time: 9562-1308 SLP Time Calculation (min) (ACUTE ONLY): 23 min  Assessment / Plan / Recommendation Clinical Impression  Pt with few instances of spontaneous 2-3 word utterances during treatment session this date with frequent word finding difficulties. During picture description task, pt provided x2 1-3 word phrases to describe scene. He benefited from sentence completion cues to name items or describe additional aspects of scene. When provided 5 tangible objects within room, he named 2/5 independently. He required mod-max sentence completion, phonemic and written cues to name the additional objects, phonemic paraphasias noted. When encouraging pt to describe the function of an unnamed object to assist with word-finding, pt unable to do this without full clinician assist/model. Family, present in room report that pt has had increased difficulty this date with verbal expression. Encouraged allowing pt time to express his needs/thoughts and asking clarification questions in order to guide conversation. SLP to continue f/u.    HPI HPI: 71 y.o. 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      SLP Plan  Continue with current plan of care      Recommendations for follow up therapy are one component of a multi-disciplinary discharge planning process, led by the attending physician.  Recommendations may be  updated based on patient status, additional functional criteria and insurance authorization.    Recommendations                   Follow Up Recommendations: Acute inpatient rehab (3hours/day) Assistance recommended at discharge: Frequent or constant Supervision/Assistance SLP Visit Diagnosis: Aphasia (R47.01) Plan: Continue with current plan of care            Avie Echevaria, MA, CCC-SLP Acute Rehabilitation Services Office Number: [phone]  Paulette Blanch  12/08/2021, 1:42 PM

## 2021-12-08 NOTE — Progress Notes (Signed)
NAME:  Steve Salazar, MRN:  [id number], DOB:  Dec 18, 1950, LOS: 15 ADMISSION DATE:  11/08/2021, CONSULTATION DATE: 11/01/2021 REFERRING MD: Dr. 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  Patient remained hypotensive, required initiation of vasopressor, PCCM was consulted for help evaluation and medical management  Pertinent  Medical History   Past Medical History:  Diagnosis Date   CKD (chronic kidney disease), stage II    COVID-19 virus infection 03/2019   HFrEF (heart failure with reduced ejection fraction) (HCC)    a. 2011 Echo: EF 25-30%; b. 10/2016 Echo: EF 20-25%; c. 11/2019 Echo: EF 20-25%, mild LVH, Gr2 DD, sev dil LA, mod dil RA, mod MR, mild to mod AI, mod PR, Asc Ao 40mm.   Hypertension    NICM (nonischemic cardiomyopathy) (HCC)    a. 2011 Echo: EF 25-30%; b. 2011 MV: basal to dist inf perfusion defect->atten; c. 10/2016 Echo: EF 20-25%; d. 11/2019 Echo: EF 20-25%; e. 11/2019 MV: No ischemia. Fixed inflat defect, inflat HK, EF 17%; f. 03/2020 Cath: Nl cors. CO/CI 4.07/1.92.   PSVT (paroxysmal supraventricular tachycardia) (HCC)    a. 12/2016 Event monitor: rare, short episodes of SVT.    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 Renal function and LFTs both elevated  8/10 stable, fixed dose Levophed  added  Interim History / Subjective:   Patient remains critically ill intubated on mechanical life support.  Objective   Blood pressure 99/80, pulse 98, temperature (!) 96.3 F (35.7 C), temperature source Axillary, resp. rate (!) 24, height 6' (1.829 m), weight 88.2 kg, SpO2 100 %. CVP:  [16 mmHg-26 mmHg] 17 mmHg      Intake/Output Summary (Last 24 hours) at 12/08/2021 1430 Last data filed at 12/08/2021 1300 Gross per 24 hour  Intake 1048.43 ml  Output 230 ml  Net 818.43 ml   Filed Weights   12/06/21 0500 12/07/21 0500 12/08/21 0500  Weight: 88.7 kg 87.7 kg 88.2 kg    General: Chronically ill-appearing gentleman no distress HEENT: NCAT tracking appropriately Neuro: Lethargic today, difficult to arouse.  He will open his eyes to voice. Heart: Regular rhythm S1-S2 Lungs: Clear to auscultation bilaterally no accessory muscle use GI: Abdomen soft nontender nondistended Extremities: No significant edema  Choloxin 40% on 7.5 dobutamine +5 add norepinephrine  Resolved Hospital Problem list   Post thrombectomy subarachnoid hemorrhage  Assessment & Plan:  Acute left MCA stroke s/p thrombectomy Dysphagia due to stroke Etiology cardioembolic in the setting of afib one eliquis and cardiomyopathy with low EF.  Residual deficits include aphasia and slight R sided weakness both improved Plan: Continue secondary stroke prophylaxis Dysphagia diet Mobilization Appreciate help with transition of care team. Heparin switched to Eliquis  Acute on chronic systolic CHF with cardiogenic shock, cardiorenal syndrome Shock liver Baseline inotrope dependent NICM Chronic atrial fibrillation Plan: Continue pacer support Dobutamine Unfortunately not a good candidate for long-term HD Not good endpoint for continuous CVVHD due to his  end-stage congestive heart failure.  Hypervolemic hyponatremia  due to deranged ADH axis; improved with vaptan Plan: Continue inotropic support and diuresis  with heart failure team. Not a good candidate for prolonged HD use. Continue ongoing goals of care discussion.   Best Practice (right click and "Reselect all SmartList Selections" daily)   Diet/type: Dysphagia diet DVT prophylaxis: DOAC GI prophylaxis: N/A Lines: yes and it is still needed as port Foley: trial d/c'ing foley Code Status:  DNR Last date of multidisciplinary goals of care discussion: see palliative notes   This patient is critically ill with multiple organ system failure; which, requires frequent high complexity decision making, assessment, support, evaluation, and titration of therapies. This was completed through the application of advanced monitoring technologies and extensive interpretation of multiple databases. During this encounter critical care time was devoted to patient care services described in this note for 32 minutes.  Josephine Igo, DO [facility] 12/08/2021 2:31 PM

## 2021-12-08 NOTE — Progress Notes (Signed)
Occupational Therapy Treatment Patient Details Name: Steve Salazar MRN: [id number] DOB: [birth year] Today's Date: 12/08/2021   History of present illness 71 y.o. male admitted to [facility] 11/03/2021 with acute aphasia, transfered to [facility] for thrombectomy of acute left M2 occlusion complicated with small amount of subarachnoid hemorrhage. Pt with inotrope dependent HF and not HD or LVAD candidate. PMHx:Gout, CKD2, NICM, HF with EF 15-20%, HTN, PSVT, ICD, Afib   OT comments  OT discharging patient this date.  Patient will need setup, cues for sequencing, and generalized min guard to supervision for in room mobility and ADL completion.  It is not reasonable to expect Steve Salazar to progress beyond his current functional level.  Deficits are as described below.  Patient status has been changed to DNR, and the family is hoping to take him home with palliative or hospice. Encourage staff to let him perform ADL as able, and OOB to bathroom.  No further needs in the acute setting.      Recommendations for follow up therapy are one component of a multi-disciplinary discharge planning process, led by the attending physician.  Recommendations may be updated based on patient status, additional functional criteria and insurance authorization.    Follow Up Recommendations  Other (comment) (Home with assist as needed)    Assistance Recommended at Discharge Frequent or constant Supervision/Assistance  Patient can return home with the following  Assistance with cooking/housework;Help with stairs or ramp for entrance;Assist for transportation;Direct supervision/assist for medications management;Direct supervision/assist for financial management;A little help with walking and/or transfers;A little help with bathing/dressing/bathroom   Equipment Recommendations  None recommended by OT    Recommendations for Other Services      Precautions / Restrictions Precautions Precautions: Fall Restrictions Weight Bearing  Restrictions: No       Mobility Bed Mobility Overal bed mobility: Needs Assistance Bed Mobility: Supine to Sit, Sit to Supine     Supine to sit: Supervision Sit to supine: Supervision        Transfers Overall transfer level: Needs assistance Equipment used: None Transfers: Sit to/from Stand Sit to Stand: Supervision                 Balance Overall balance assessment: Mild deficits observed, not formally tested                                         ADL either performed or assessed with clinical judgement   ADL                                         General ADL Comments: cues, min guard to supervision    Extremity/Trunk Assessment Upper Extremity Assessment Upper Extremity Assessment: Overall WFL for tasks assessed   Lower Extremity Assessment Lower Extremity Assessment: Defer to PT evaluation   Cervical / Trunk Assessment Cervical / Trunk Assessment: Normal    Vision Patient Visual Report: Peripheral vision impairment     Perception Perception Perception: Impaired   Praxis Praxis Praxis: Impaired Praxis Impairment Details: Perseveration;Initiation    Cognition Arousal/Alertness: Awake/alert Behavior During Therapy: Flat affect Overall Cognitive Status: Impaired/Different from baseline  Pertinent Vitals/ Pain       Pain Assessment Pain Assessment: No/denies pain Pain Intervention(s): Monitored during session                                                          Frequency    discharge       Progress Toward Goals  OT Goals(current goals can now be found in the care plan section)  Progress towards OT goals: Not progressing toward goals - comment (max functional potential)  Acute Rehab OT Goals OT Goal Formulation: With patient Time For Goal Achievement: 12/22/21 Potential to Achieve  Goals: Fair  Plan Other (comment) (Max functional potential - discharge)    Co-evaluation                 AM-PAC OT "6 Clicks" Daily Activity     Outcome Measure   Help from another person eating meals?: A Little Help from another person taking care of personal grooming?: A Little Help from another person toileting, which includes using toliet, bedpan, or urinal?: A Little Help from another person bathing (including washing, rinsing, drying)?: A Little Help from another person to put on and taking off regular upper body clothing?: A Little Help from another person to put on and taking off regular lower body clothing?: A Little 6 Click Score: 18    End of Session    OT Visit Diagnosis: Unsteadiness on feet (R26.81);Other abnormalities of gait and mobility (R26.89);Low vision, both eyes (H54.2);Other symptoms and signs involving cognitive function;Cognitive communication deficit (R41.841)   Activity Tolerance Patient tolerated treatment well   Patient Left in bed;with call bell/phone within reach;with family/visitor present   Nurse Communication Mobility status        Time: 0814-4818 OT Time Calculation (min): 33 min  Charges: OT General Charges $OT Visit: 1 Visit OT Treatments $Self Care/Home Management : 23-37 mins  12/08/2021  RP, OTR/L  Acute Rehabilitation Services  Office:  [phone]   Steve Salazar 12/08/2021, 9:10 AM

## 2021-12-08 NOTE — TOC Progression Note (Signed)
Transition of Care [facility]) - Progression Note    Patient Details  Name: Steve Salazar MRN: [id number] Date of Birth: 1950-08-12  [facility] CM/SW Contact  Delilah Shan, LCSWA Phone Number: 12/08/2021, 11:51 AM  Clinical Narrative:     CSW started insurance authorization for patient reference # [id number]. Patient has SNF bed at [facility] CSW will continue to follow and assist with patients dc planning needs.  Expected Discharge Plan: Skilled Nursing Facility Barriers to Discharge: SNF Pending bed offer  Expected Discharge Plan and Services Expected Discharge Plan: Skilled Nursing Facility     Post Acute Care Choice: Skilled Nursing Facility Living arrangements for the past 2 months: Single Family Home                                       Social Determinants of Health (SDOH) Interventions    Readmission Risk Interventions     No data to display

## 2021-12-08 NOTE — Progress Notes (Signed)
Palliative Care Progress Note, Assessment & Plan   Patient Name: Steve Salazar       Date: 12/08/2021 DOB: 1950-09-01  Age: 71 y.o. MRN#: [id number] Attending Physician: Josephine Igo, DO Primary Care Physician: [name], MD Admit Date: 12-19-21  Reason for Consultation/Follow-up: Establishing goals of care  Subjective: Patient is lying in bed in no apparent distress.  He acknowledges my presence.  He is able to make his wishes known.  His color looks a little more dusky and more pale than when I first saw him on Tuesday.  Patient sister and brother at bedside.  HPI: 71 y.o. 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 He was admitted on 19-Dec-2021 with acute left MCA stroke status post thrombectomy, post procedure small subarachnoid hemorrhage, acute on chronic CHF with cardiogenic shock on vasopressors, AKI on CKD, metabolic encephalopathy, shock liver, and others. Palliative was consulted for goals of care conversations.   Summary of counseling/coordination of care: After reviewing patient's chart and assessing the patient at bedside, spoke with patient's Sister Eber Jones and his brother at bedside in regards to goals of care.  Eber Jones states she can see a significant change in him today.  She says he is more sleepy, not as interactive, does not have as much energy, and appears overall to be slowing down.  I attempted to elicit goals important to the patient.   Eber Jones shares, as she has before, that she does not want the patient to suffer or linger.  Discussed human mortality and medical interventions to sustain life versus provide a compassionate and dignified end-of-life.  Eber Jones is clear that the family continues to be in agreement with a DNR.  She shares they know that if he passes they want him to pass in peace.   Eber Jones was clear that family understands patient will not be able to leave the hospital.  She shares family understands that he is declining and starting to have symptoms because of his renal failure.  She shares family does not want to have him moved and then have to try to bring him back and forth for treatment.  She shares family is clear that he will likely pass in the hospital.  Eber Jones plans to meet with patient's children again tomorrow to further discuss patient's decline.  Palliative medicine team will continue to be available to the patient and family and assist with goals of cares discussions as needed.  Code Status: DNR  Prognosis: < 6 weeks  Discharge Planning: Anticipated Hospital Death  Physical Exam Vitals reviewed.  Constitutional:      General: He is not in acute distress.    Appearance: He is ill-appearing. He is not toxic-appearing.  HENT:     Head: Normocephalic.     Mouth/Throat:     Mouth: Mucous membranes are moist.  Eyes:     Pupils: Pupils are equal, round, and reactive to light.  Cardiovascular:  Rate and Rhythm: Normal rate.     Pulses: Normal pulses.  Pulmonary:     Effort: Pulmonary effort is normal.  Abdominal:     Palpations: Abdomen is soft.  Musculoskeletal:     Comments: Generalized weakness  Neurological:     Mental Status: He is alert.     Comments: Expressive aphasia, not as alert and interactive today  Psychiatric:        Behavior: Behavior normal.             Palliative Assessment/Data: 40%    Total Time 35 minutes  Greater than 50%  of this time was spent  counseling and coordinating care related to the above assessment and plan.  Thank you for allowing the Palliative Medicine Team to assist in the care of this patient.  Samara Deist L. Manon Hilding, FNP-BC Palliative Medicine Team Team Phone # [phone]

## 2021-12-08 NOTE — Progress Notes (Addendum)
Patient ID: Steve Salazar, male   DOB: 05/30/1950, 71 y.o.   MRN: [id number]     Advanced Heart Failure Rounding Note  PCP-Cardiologist: Ida Rogue, MD   Subjective:    7/31: Milrinone d/c 2/2 hypotension. Transitioned to DBA 5 + Lasix gtt at 15/hr. Midodrine added.  8/1: Co-ox 48%, DBA increased to 7.5. Metolazone added to augment diuresis  8/2: Lasix gtt increased to 20/hr + 2.5 metolazone  8/3: NE weaned off. Lasix gtt off. Started PO torsemide.  8/5: NE restarted 8/7: Started on lasix drip at 30. Given tolvaptan.   CO-OX 40% on 7.5 DBA + 5 NE.   CVP 18. On lasix gtt at 30/hr. Received 5 mg metolazone BID + 15 Tolvaptan yesterday. 525 cc UOP charted last 24 hrs. Scr trending bck up today, 5.3>5.54.  More lethargic today. Difficult to arouse. Nurse reports he's complained of "itchiness". Minimal PO intake. Retaining urine.     Objective:   Weight Range: 88.2 kg Body mass index is 26.37 kg/m.   Vital Signs:   Temp:  [97.4 F (36.3 C)-97.8 F (36.6 C)] 97.4 F (36.3 C) (08/11 0300) Pulse Rate:  [75-174] 173 (08/11 0400) Resp:  [11-27] 15 (08/11 0400) BP: (83-98)/(60-85) 92/66 (08/11 0400) SpO2:  [85 %-99 %] 98 % (08/11 0400) Weight:  [88.2 kg] 88.2 kg (08/11 0500) Last BM Date : 12/07/21  Weight change: Filed Weights   12/06/21 0500 12/07/21 0500 12/08/21 0500  Weight: 88.7 kg 87.7 kg 88.2 kg    Intake/Output:   Intake/Output Summary (Last 24 hours) at 12/08/2021 0659 Last data filed at 12/08/2021 0500 Gross per 24 hour  Intake 1139.52 ml  Output 525 ml  Net 614.52 ml      Physical Exam    General:  Ill appearing. Lethargic. HEENT: normal Neck: supple. JVP to ear. Carotids 2+ bilat; no bruits. Cor: PMI nondisplaced. Regular rate & rhythm. No rubs, gallops or murmurs. Lungs: clear Abdomen: soft, nontender, slightly distended.  Extremities: no cyanosis, clubbing, rash, 1+ edema, + TED hose Neuro: Drowsy. Difficult to arouse.    Telemetry  AF,  Bi-V paced, 90s  Labs    CBC Recent Labs    12/07/21 0431 12/08/21 0500  WBC 10.6* 11.7*  HGB 12.2* 12.7*  HCT 36.8* 37.3*  MCV 86.8 86.9  PLT 242 [id number]   Basic Metabolic Panel Recent Labs    12/07/21 0431 12/08/21 0500  NA 126* 126*  K 3.9 4.0  CL 85* 82*  CO2 27 28  GLUCOSE 169* 160*  BUN 117* 128*  CREATININE 5.29* 5.54*  CALCIUM 8.7* 8.9  PHOS 5.1* 6.0*   Liver Function Tests Recent Labs    12/07/21 0431 12/08/21 0500  ALBUMIN 3.1* 3.3*    No results for input(s): "LIPASE", "AMYLASE" in the last 72 hours. Cardiac Enzymes No results for input(s): "CKTOTAL", "CKMB", "CKMBINDEX", "TROPONINI" in the last 72 hours.  BNP: BNP (last 3 results) No results for input(s): "BNP" in the last 8760 hours.  ProBNP (last 3 results) No results for input(s): "PROBNP" in the last 8760 hours.   D-Dimer No results for input(s): "DDIMER" in the last 72 hours. Hemoglobin A1C No results for input(s): "HGBA1C" in the last 72 hours.  Fasting Lipid Panel No results for input(s): "CHOL", "HDL", "LDLCALC", "TRIG", "CHOLHDL", "LDLDIRECT" in the last 72 hours.  Thyroid Function Tests No results for input(s): "TSH", "T4TOTAL", "T3FREE", "THYROIDAB" in the last 72 hours.  Invalid input(s): "FREET3"  Other results:   Imaging  No results found.   Medications:     Scheduled Medications:  amiodarone  200 mg Oral BID   apixaban  5 mg Oral BID   Chlorhexidine Gluconate Cloth  6 each Topical Daily   colchicine  0.3 mg Oral Daily   feeding supplement  237 mL Oral TID BM   insulin aspart  0-5 Units Subcutaneous QHS   insulin aspart  0-9 Units Subcutaneous TID WC   midodrine  10 mg Oral Q8H   oxybutynin  5 mg Oral TID   predniSONE  40 mg Oral Q breakfast   simethicone  80 mg Oral QID    Infusions:  sodium chloride     sodium chloride Stopped (12/04/21 1213)   sodium chloride     sodium chloride     DOBUTamine 7.5 mcg/kg/min (12/08/21 0522)   furosemide (LASIX)  200 mg in dextrose 5 % 100 mL (2 mg/mL) infusion 30 mg/hr (12/08/21 0523)   norepinephrine (LEVOPHED) Adult infusion 5 mcg/min (12/08/21 0500)    PRN Medications: Place/Maintain arterial line **AND** sodium chloride, Place/Maintain arterial line **AND** sodium chloride, acetaminophen **OR** acetaminophen (TYLENOL) oral liquid 160 mg/5 mL **OR** acetaminophen, alum & mag hydroxide-simeth, mouth rinse, senna-docusate    Assessment/Plan   1. CVA: Acute left MCA CVA, likely cardioembolic.  Has dilated cardiomyopathy, also chronic atrial fibrillation. Echo did not show LV thrombus.  He is now s/p thrombectomy with residual small SAH. Per family, he probably was not taking his Eliquis at home.  C/w expressive aphasia, also aspiration risk (on full liquids currently).  - Heparin switched to Eliquis (not a drug failure as he apparently was not taking it). 2. Atrial fibrillation: Chronic, s/p AV nodal ablation.  Had successful DCCV in 6/23 but now back in AF.  - Eliquis 5 mg bid   3. AKI on CKD stage 3: Baseline creatinine 1.8-2 range, suspect cardiorenal syndrome with superimposed ATN from hypotension earlier in hospitalization and possible CIN contribution.  -Creatinine plateaued but now trending back up, 5.3>5.54 this am. BUN still going up >>117>128 - UOP only 525 cc yesterday with lasix gtt 30/hr + 5 mg metolazone BID + 15 tolvaptan. Repeat again today.  - Continue inotropic support but suspect we are nearing a palliative situation w/ end-stage HF. Not good candidate for long-term HD. No good end point for CVVH (poor iHD candidate with end-stage CHF).  - Appears to be developing signs of uremia >> pruritis, drowsiness 4. 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  Last cath in 12/21 with normal coronaries.  Echo this admission with EF 20%, severe LV dilation, severe biatrial enlargement, moderate RV enlargement with mildly decreased systolic function, mod-severe MR,  moderate TR, dilated IVC.  Has St Jude CRT-D device.  He has known low output/end stage HF, was started on milrinone gtt 0.2 at [facility] in 12/22.  He so far has refused LVAD.  He had been off milrinone initially after admission, co-ox was 40% with AKI.  Milrinone restarted 7/29 at 0.375 and NE added to maintain MAP (had been on vasopressin). Milrinone later discontinued due to soft BPs. Now on DBA 7.5 with NE 5. CO-OX 40%. CVP 18 but flat waveform. - Suboptimal diuresis yesterday with lasix gtt 30/hr + 5 mg metolazone BID. Repeat today. - Na 126 again today after Tolvaptan 15 yesterday. Repeat today. - If this is unsuccessful, only way to decongest would be CVVH, but as above, there does not appear to be an  end point for this. - There is consideration in notes about barostimulator placement, however do not think this would be helpful with such advanced HF (inotrope-dependent).  - Patient has refused LVAD in the past, now with large left MCA CVA.  He is not currently an LVAD candidate with the acute CVA (will need to see functional recovery), and given apparent lack of compliance with apixaban would be concerned about his compliance with warfarin if an LVAD were implanted. Now with AKI, we are heading in the wrong direction.  Palliative care is following. Appears to be moving towards a palliative/hospice situation given MSOF. Now DNR. 5. Mitral regurgitation: Moderate-severe functional MR.  LVESD > 7 cm, would not be Mitraclip candidate.  6. PVCs/NSVT: Continue amiodarone po.  - keep K > 4.0 and Mg > 2.0   7. Hyponatremia: Hypervolemic hyponatremia. Received Tolvaptan 08/07, 08/08, 08/09 - Will give dose tolvaptan today.  8. Gout: Flare left great toe.  - Prednisone course   GOC: He is now DNR.  Family not interested in hospice/comfort care.  Our hope at this point will be to stabilize him to get him home with home dobutamine infusion.  Not LVAD or HD candidate.    Planning for discharge to SNF once  medically ready but now seems to be developing some signs of uremia.   FINCH, LINDSAY N. 12/08/2021 6:59 AM  Patient seen with NP, agree with the above note.   UOP 525 with high dose diuretics, co-ox 40% on dobutamine 7.5 + NE 5.  BUN and creatinine higher (128/5.54).  He is drowsy this morning and report pruritis.  CVP difficult (poor waveform) but seems 18-20 range.   General: NAD Neck: JVP 16 cm, no thyromegaly or thyroid nodule.  Lungs: Clear to auscultation bilaterally with normal respiratory effort. CV: Nondisplaced PMI.  Heart regular S1/S2, no S3/S4, 2/6 HSM apex.  1+ edema to knee.  Abdomen: Soft, nontender, no hepatosplenomegaly, no distention.  Skin: Intact without lesions or rashes.  Neurologic: Alert and oriented x 3.  Psych: Normal affect. Extremities: No clubbing or cyanosis.  HEENT: Normal.   SBP 90s this morning, will increase NE to 8 with co-ox 40.  Poor diuresis but with elevated CVP will continue Lasix gtt + metolazone.    Patient has signs of uremia with pruritis and drowsiness.  He is worsening at this point, not RRT candidate.  He is DNR.  He is not going to be stable enough to go out to SNF.  Will have palliative care talk with family again given change in status.  Comfort care transition would be reasonable.   CRITICAL CARE Performed by: Marca Ancona  Total critical care time: 35 minutes  Critical care time was exclusive of separately billable procedures and treating other patients.  Critical care was necessary to treat or prevent imminent or life-threatening deterioration.  Critical care was time spent personally by me on the following activities: development of treatment plan with patient and/or surrogate as well as nursing, discussions with consultants, evaluation of patient's response to treatment, examination of patient, obtaining history from patient or surrogate, ordering and performing treatments and interventions, ordering and review of laboratory  studies, ordering and review of radiographic studies, pulse oximetry and re-evaluation of patient's condition.  Marca Ancona. 12/08/2021. 7:37 AM

## 2021-12-09 DIAGNOSIS — R570 Cardiogenic shock: Secondary | ICD-10-CM | POA: Diagnosis not present

## 2021-12-09 DIAGNOSIS — I63412 Cerebral infarction due to embolism of left middle cerebral artery: Secondary | ICD-10-CM | POA: Diagnosis not present

## 2021-12-09 LAB — COOXEMETRY PANEL
Carboxyhemoglobin: 1.4 % (ref 0.5–1.5)
Methemoglobin: 0.9 % (ref 0.0–1.5)
O2 Saturation: 40.7 %
Total hemoglobin: 13.6 g/dL (ref 12.0–16.0)

## 2021-12-09 LAB — RENAL FUNCTION PANEL
Albumin: 3.2 g/dL — ABNORMAL LOW (ref 3.5–5.0)
Anion gap: 17 — ABNORMAL HIGH (ref 5–15)
BUN: 134 mg/dL — ABNORMAL HIGH (ref 8–23)
CO2: 28 mmol/L (ref 22–32)
Calcium: 9 mg/dL (ref 8.9–10.3)
Chloride: 81 mmol/L — ABNORMAL LOW (ref 98–111)
Creatinine, Ser: 5.91 mg/dL — ABNORMAL HIGH (ref 0.61–1.24)
GFR, Estimated: 10 mL/min — ABNORMAL LOW (ref >60)
Glucose, Bld: 147 mg/dL — ABNORMAL HIGH (ref 70–99)
Phosphorus: 6.7 mg/dL — ABNORMAL HIGH (ref 2.5–4.6)
Potassium: 4.5 mmol/L (ref 3.5–5.1)
Sodium: 126 mmol/L — ABNORMAL LOW (ref 135–145)

## 2021-12-09 LAB — CBC
HCT: 38.3 % — ABNORMAL LOW (ref 39.0–52.0)
Hemoglobin: 13 g/dL (ref 13.0–17.0)
MCH: 29.1 pg (ref 26.0–34.0)
MCHC: 33.9 g/dL (ref 30.0–36.0)
MCV: 85.9 fL (ref 80.0–100.0)
Platelets: 227 10*3/uL (ref 150–400)
RBC: 4.46 MIL/uL (ref 4.22–5.81)
RDW: 15.5 % (ref 11.5–15.5)
WBC: 12 10*3/uL — ABNORMAL HIGH (ref 4.0–10.5)
nRBC: 0.7 % — ABNORMAL HIGH (ref 0.0–0.2)

## 2021-12-09 LAB — GLUCOSE, CAPILLARY
Glucose-Capillary: 172 mg/dL — ABNORMAL HIGH (ref 70–99)
Glucose-Capillary: 164 mg/dL — ABNORMAL HIGH (ref 70–99)
Glucose-Capillary: 171 mg/dL — ABNORMAL HIGH (ref 70–99)
Glucose-Capillary: 121 mg/dL — ABNORMAL HIGH (ref 70–99)

## 2021-12-09 MED ORDER — MIDAZOLAM-SODIUM CHLORIDE 100-0.9 MG/100ML-% IV SOLN: 2.0000 mg/h | INTRAVENOUS | Status: DC

## 2021-12-09 MED ORDER — SODIUM CHLORIDE 0.9 % IV SOLN
1.0000 mg/h | INTRAVENOUS | Status: DC
  Filled 2021-12-09: qty 2.5

## 2021-12-09 MED ORDER — HYDROMORPHONE BOLUS VIA INFUSION
2.0000 mg | Freq: Once | INTRAVENOUS | Status: DC
  Filled 2021-12-09: qty 2

## 2021-12-09 NOTE — Progress Notes (Signed)
Patient ID: Steve Salazar, male   DOB: [birth year], 71 y.o.   MRN: [id number]     Advanced Heart Failure Rounding Note  PCP-Cardiologist: [name], MD   Subjective:    7/31: Milrinone d/c 2/2 hypotension. Transitioned to DBA 5 + Lasix gtt at 15/hr. Midodrine added.  8/1: Co-ox 48%, DBA increased to 7.5. Metolazone added to augment diuresis  8/2: Lasix gtt increased to 20/hr + 2.5 metolazone  8/3: NE weaned off. Lasix gtt off. Started PO torsemide.  8/5: NE restarted 8/7: Started on lasix drip at 30. Given tolvaptan.   CO-OX 41%  despite increasing NE. Now on DBA 7.5 + NE 5 -> 8.   Na 126  SCr 5.5 -> 5.9  Remains confused but denies distress. Sister at bedside (she is SW) says he was quite SOB yesterday   Objective:   Weight Range: 87.2 kg Body mass index is 26.07 kg/m.   Vital Signs:   Temp:  [96.3 F (35.7 C)-97.7 F (36.5 C)] 97.5 F (36.4 C) (08/12 0700) Pulse Rate:  [89-112] 92 (08/12 0700) Resp:  [9-27] 27 (08/12 0700) BP: (89-101)/(72-84) 92/78 (08/12 0700) SpO2:  [84 %-100 %] 90 % (08/12 0700) Weight:  [87.2 kg] 87.2 kg (08/12 0630) Last BM Date : 12/08/21  Weight change: Filed Weights   12/07/21 0500 12/08/21 0500 12/09/21 0630  Weight: 87.7 kg 88.2 kg 87.2 kg    Intake/Output:   Intake/Output Summary (Last 24 hours) at 12/09/2021 0945 Last data filed at 12/09/2021 0800 Gross per 24 hour  Intake 1041.74 ml  Output 447 ml  Net 594.74 ml       Physical Exam    General:  Sitting in chair No resp difficulty HEENT: normal Neck: supple. JVP to ear  Carotids 2+ bilat; no bruits. No lymphadenopathy or thryomegaly appreciated. Cor: PMI nondisplaced. Regular rate & rhythm. No rubs, gallops or murmurs. Lungs: clear Abdomen: soft, nontender, + distended. No hepatosplenomegaly. No bruits or masses. Good bowel sounds. Extremities: no cyanosis, clubbing, rash, 1-2+ edema Neuro: alert but confused + asterixis    Telemetry  AF, Bi-V paced,  90s  Labs    CBC Recent Labs    12/08/21 0500 12/09/21 0314  WBC 11.7* 12.0*  HGB 12.7* 13.0  HCT 37.3* 38.3*  MCV 86.9 85.9  PLT 225 227    Basic Metabolic Panel Recent Labs    28/36/62 0500 12/09/21 0314  NA 126* 126*  K 4.0 4.5  CL 82* 81*  CO2 28 28  GLUCOSE 160* 147*  BUN 128* 134*  CREATININE 5.54* 5.91*  CALCIUM 8.9 9.0  PHOS 6.0* 6.7*    Liver Function Tests Recent Labs    12/08/21 0500 12/09/21 0314  ALBUMIN 3.3* 3.2*     No results for input(s): "LIPASE", "AMYLASE" in the last 72 hours. Cardiac Enzymes No results for input(s): "CKTOTAL", "CKMB", "CKMBINDEX", "TROPONINI" in the last 72 hours.  BNP: BNP (last 3 results) No results for input(s): "BNP" in the last 8760 hours.  ProBNP (last 3 results) No results for input(s): "PROBNP" in the last 8760 hours.   D-Dimer No results for input(s): "DDIMER" in the last 72 hours. Hemoglobin A1C No results for input(s): "HGBA1C" in the last 72 hours.  Fasting Lipid Panel No results for input(s): "CHOL", "HDL", "LDLCALC", "TRIG", "CHOLHDL", "LDLDIRECT" in the last 72 hours.  Thyroid Function Tests No results for input(s): "TSH", "T4TOTAL", "T3FREE", "THYROIDAB" in the last 72 hours.  Invalid input(s): "FREET3"  Other results:  Imaging    No results found.   Medications:     Scheduled Medications:  amiodarone  200 mg Oral BID   apixaban  5 mg Oral BID   Chlorhexidine Gluconate Cloth  6 each Topical Daily   colchicine  0.3 mg Oral Daily   feeding supplement  237 mL Oral TID BM   insulin aspart  0-5 Units Subcutaneous QHS   insulin aspart  0-9 Units Subcutaneous TID WC   midodrine  10 mg Oral Q8H   oxybutynin  5 mg Oral TID   simethicone  80 mg Oral QID    Infusions:  sodium chloride     sodium chloride Stopped (12/04/21 1213)   sodium chloride     sodium chloride     DOBUTamine 7.5 mcg/kg/min (12/09/21 0747)   furosemide (LASIX) 200 mg in dextrose 5 % 100 mL (2 mg/mL)  infusion 30 mg/hr (12/09/21 0400)   norepinephrine (LEVOPHED) Adult infusion 8 mcg/min (12/09/21 0400)    PRN Medications: Place/Maintain arterial line **AND** sodium chloride, Place/Maintain arterial line **AND** sodium chloride, acetaminophen **OR** acetaminophen (TYLENOL) oral liquid 160 mg/5 mL **OR** acetaminophen, alum & mag hydroxide-simeth, mouth rinse, senna-docusate    Assessment/Plan   1. CVA: Acute left MCA CVA, likely cardioembolic.  Has dilated cardiomyopathy, also chronic atrial fibrillation. Echo did not show LV thrombus.  He is now s/p thrombectomy with residual small SAH. Per family, he probably was not taking his Eliquis at home.  C/w expressive aphasia, also aspiration risk (on full liquids currently).  - Heparin switched to Eliquis (not a drug failure as he apparently was not taking it). 2. Atrial fibrillation: Chronic, s/p AV nodal ablation.  Had successful DCCV in 6/23 but now back in AF.  - Eliquis 5 mg bid   3. AKI on CKD stage 3: Baseline creatinine 1.8-2 range, suspect cardiorenal syndrome with superimposed ATN from hypotension earlier in hospitalization and possible CIN contribution.  - Creatinine plateaued but now trending back up, 5.3>5.54> 5.9 this am. BUN still going up >>117>128> 134 - Now uremic.  4. 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  Last cath in 12/21 with normal coronaries.  Echo this admission with EF 20%, severe LV dilation, severe biatrial enlargement, moderate RV enlargement with mildly decreased systolic function, mod-severe MR, moderate TR, dilated IVC.  Has St Jude CRT-D device.  He has known low output/end stage HF, was started on milrinone gtt 0.2 at [facility] in 12/22. Refused LVAD.  Now on DBA 7.5 with NE 5 -> 7 . CO-OX 41%. CVP high - Suboptimal diuresis yesterday with lasix gtt 30/hr + 5 mg metolazone BID.  - Not candidate for CVVHD - He has reached end-stage with MSOF. Will need transition to  comfort care to prevent suffering  5. Mitral regurgitation: Moderate-severe functional MR.    6. PVCs/NSVT: Continue amiodarone po.  - keep K > 4.0 and Mg > 2.0   7. Hyponatremia: Hypervolemic hyponatremia. Received Tolvaptan 08/07, 08/08, 08/09, 8/11 8. DNR/DNI - He has reached end-stage with MSOF. Will need transition to comfort care to prevent any further suffering . D/w Dr. Tonia Brooms (CCM)  I discussed with patient and his sister. Currently he is feeling OK but is growing increasingly confused. Has had periods of discomfort with increased dyspnea. Family does not want him to suffer. Will continue current regimen for now but as symptom burden increases will switch to dilaudid and versed drips for comfort  and stop inotropes at that point. No further excalation of care. They are aware that he will almost certainly pass in hospital. More family coming to see him.   CRITICAL CARE Performed by: Glori Bickers  Total critical care time: 45 minutes  Critical care time was exclusive of separately billable procedures and treating other patients.  Critical care was necessary to treat or prevent imminent or life-threatening deterioration.  Critical care was time spent personally by me (independent of midlevel providers or residents) on the following activities: development of treatment plan with patient and/or surrogate as well as nursing, discussions with consultants, evaluation of patient's response to treatment, examination of patient, obtaining history from patient or surrogate, ordering and performing treatments and interventions, ordering and review of laboratory studies, ordering and review of radiographic studies, pulse oximetry and re-evaluation of patient's condition.   Lino Wickliff. 12/09/2021 9:45 AM

## 2021-12-09 NOTE — TOC Progression Note (Signed)
Transition of Care [facility]) - Progression Note    Patient Details  Name: Steve Salazar MRN: [id number] Date of Birth: [birth year]  Transition of Care [facility]) CM/SW Contact  Patrice Paradise, LCSW Phone Number:[phone] 12/09/2021, 10:08 AM  Clinical Narrative:    [name] authorization is back.  [employer] #- [id number] [employer] #- [id number] Authorization dates: 8/12-8/15 Fax Number: [phone]    TOC team will continue to assist with discharge planning needs.   Expected Discharge Plan: Skilled Nursing Facility Barriers to Discharge: SNF Pending bed offer  Expected Discharge Plan and Services Expected Discharge Plan: Skilled Nursing Facility     Post Acute Care Choice: Skilled Nursing Facility Living arrangements for the past 2 months: Single Family Home                                       Social Determinants of Health (SDOH) Interventions    Readmission Risk Interventions     No data to display

## 2021-12-09 NOTE — Progress Notes (Signed)
NAME:  Steve Salazar, MRN:  [id number], DOB:  09/29/1950, LOS: 16 ADMISSION DATE:  19-Dec-2021, CONSULTATION DATE: 10/29/2021 REFERRING MD: Dr. 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  Patient remained hypotensive, required initiation of vasopressor, PCCM was consulted for help evaluation and medical management  Pertinent  Medical History   Past Medical History:  Diagnosis Date   CKD (chronic kidney disease), stage II    COVID-19 virus infection 03/2019   HFrEF (heart failure with reduced ejection fraction) (HCC)    a. 2011 Echo: EF 25-30%; b. 10/2016 Echo: EF 20-25%; c. 11/2019 Echo: EF 20-25%, mild LVH, Gr2 DD, sev dil LA, mod dil RA, mod MR, mild to mod AI, mod PR, Asc Ao 32mm.   Hypertension    NICM (nonischemic cardiomyopathy) (HCC)    a. 2011 Echo: EF 25-30%; b. 2011 MV: basal to dist inf perfusion defect->atten; c. 10/2016 Echo: EF 20-25%; d. 11/2019 Echo: EF 20-25%; e. 11/2019 MV: No ischemia. Fixed inflat defect, inflat HK, EF 17%; f. 03/2020 Cath: Nl cors. CO/CI 4.07/1.92.   PSVT (paroxysmal supraventricular tachycardia) (HCC)    a. 12/2016 Event monitor: rare, short episodes of SVT.    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 Renal function and LFTs both elevated  8/10 stable, fixed dose Levophed  added  Interim History / Subjective:   Remains critically ill on vasopressor support.  Norepinephrine and dobutamine.  Kidney function worsening  Objective   Blood pressure 92/78, pulse 92, temperature (!) 97.5 F (36.4 C), temperature source Oral, resp. rate (!) 27, height 6' (1.829 m), weight 87.2 kg, SpO2 90 %. CVP:  [12 mmHg-38 mmHg] 19 mmHg      Intake/Output Summary (Last 24 hours) at 12/09/2021 0826 Last data filed at 12/09/2021 0800 Gross per 24 hour  Intake 1096.99 ml  Output 447 ml  Net 649.99 ml   Filed Weights   12/07/21 0500 12/08/21 0500 12/09/21 0630  Weight: 87.7 kg 88.2 kg 87.2 kg    General: Chronically ill-appearing gentleman no distress sitting in chair family at bedside HEENT: NCAT, tracking appropriately Neuro: Sitting up in chair following commands Heart: Regular rate rhythm S1-S2 Lungs: Diminished breath sounds bilaterally GI: Soft nontender nondistended Extremities: Bilateral lower extremity ankle edema  Resolved Hospital Problem list   Post thrombectomy subarachnoid hemorrhage  Assessment & Plan:  Acute left MCA stroke s/p thrombectomy Dysphagia due to stroke Etiology cardioembolic in the setting of afib one eliquis and cardiomyopathy with low EF.  Residual deficits include aphasia and slight R sided weakness both improved Plan: Continue stroke prophylaxis, dysphagia diet, mobilization, Eliquis  Acute on chronic systolic CHF with cardiogenic shock, cardiorenal syndrome Shock liver Baseline inotrope dependent NICM Chronic atrial fibrillation Plan: Pacer support Dobutamine Norepinephrine not a good candidate for long-term HD.  Hypervolemic hyponatremia  due to deranged ADH axis; improved with vaptan Plan: Inotropic support per heart failure team Not a good candidate for long-term HD  Goals  of care: We will discuss potential transition for comfort care at some point in time.  Multidisciplinary discussion with heart failure team this  morning.   Best Practice (right click and "Reselect all SmartList Selections" daily)   Diet/type: Dysphagia diet DVT prophylaxis: DOAC GI prophylaxis: N/A Lines: yes and it is still needed as port Foley: trial d/c'ing foley Code Status:  DNR Last date of multidisciplinary goals of care discussion: see palliative notes  This patient is critically ill with multiple organ system failure; which, requires frequent high complexity decision making, assessment, support, evaluation, and titration of therapies. This was completed through the application of advanced monitoring technologies and extensive interpretation of multiple databases. During this encounter critical care time was devoted to patient care services described in this note for 33 minutes.   Josephine Igo, DO [facility] 12/09/2021 8:26 AM

## 2021-12-09 NOTE — Progress Notes (Addendum)
eLink Physician-Brief Progress Note Patient Name: Steve Salazar DOB: 1951/03/03 MRN: [id number]   Date of Service  12/09/2021  HPI/Events of Note  Bed side RN updated that patient refusing his oral midodrine, eliquis.  He is DNR. Family does not want any escalation of care  Camera eval done. SBP 89, MAP 79. Not in distress.   eICU Interventions  Continue current care plan. If getting discomfort, respiratory distress to go for comfort care.      Intervention Category Intermediate Interventions: Hypotension - evaluation and management Minor Interventions: Communication with other healthcare providers and/or family  Ranee Gosselin 12/09/2021, 9:43 PM

## 2021-12-10 DIAGNOSIS — I63412 Cerebral infarction due to embolism of left middle cerebral artery: Secondary | ICD-10-CM | POA: Diagnosis not present

## 2021-12-10 DIAGNOSIS — R570 Cardiogenic shock: Secondary | ICD-10-CM | POA: Diagnosis not present

## 2021-12-10 LAB — COMPREHENSIVE METABOLIC PANEL
ALT: 26 U/L (ref 0–44)
AST: 21 U/L (ref 15–41)
Albumin: 3.2 g/dL — ABNORMAL LOW (ref 3.5–5.0)
Alkaline Phosphatase: 79 U/L (ref 38–126)
Anion gap: 16 — ABNORMAL HIGH (ref 5–15)
BUN: 143 mg/dL — ABNORMAL HIGH (ref 8–23)
CO2: 28 mmol/L (ref 22–32)
Calcium: 8.4 mg/dL — ABNORMAL LOW (ref 8.9–10.3)
Chloride: 82 mmol/L — ABNORMAL LOW (ref 98–111)
Creatinine, Ser: 6.09 mg/dL — ABNORMAL HIGH (ref 0.61–1.24)
GFR, Estimated: 9 mL/min — ABNORMAL LOW (ref >60)
Glucose, Bld: 105 mg/dL — ABNORMAL HIGH (ref 70–99)
Potassium: 3.7 mmol/L (ref 3.5–5.1)
Sodium: 126 mmol/L — ABNORMAL LOW (ref 135–145)
Total Bilirubin: 2.6 mg/dL — ABNORMAL HIGH (ref 0.3–1.2)
Total Protein: 6 g/dL — ABNORMAL LOW (ref 6.5–8.1)

## 2021-12-10 LAB — GLUCOSE, CAPILLARY
Glucose-Capillary: 155 mg/dL — ABNORMAL HIGH (ref 70–99)
Glucose-Capillary: 134 mg/dL — ABNORMAL HIGH (ref 70–99)
Glucose-Capillary: 120 mg/dL — ABNORMAL HIGH (ref 70–99)
Glucose-Capillary: 125 mg/dL — ABNORMAL HIGH (ref 70–99)

## 2021-12-10 LAB — CBC
HCT: 36.7 % — ABNORMAL LOW (ref 39.0–52.0)
Hemoglobin: 13 g/dL (ref 13.0–17.0)
MCH: 29.7 pg (ref 26.0–34.0)
MCHC: 35.4 g/dL (ref 30.0–36.0)
MCV: 83.8 fL (ref 80.0–100.0)
Platelets: 215 10*3/uL (ref 150–400)
RBC: 4.38 MIL/uL (ref 4.22–5.81)
RDW: 15.6 % — ABNORMAL HIGH (ref 11.5–15.5)
WBC: 12.5 10*3/uL — ABNORMAL HIGH (ref 4.0–10.5)
nRBC: 0.6 % — ABNORMAL HIGH (ref 0.0–0.2)

## 2021-12-10 LAB — COOXEMETRY PANEL
Carboxyhemoglobin: 1.7 % — ABNORMAL HIGH (ref 0.5–1.5)
Methemoglobin: 0.7 % (ref 0.0–1.5)
O2 Saturation: 49.1 %
Total hemoglobin: 13.5 g/dL (ref 12.0–16.0)

## 2021-12-10 LAB — PHOSPHORUS: Phosphorus: 6.9 mg/dL — ABNORMAL HIGH (ref 2.5–4.6)

## 2021-12-10 NOTE — Progress Notes (Signed)
Patient ID: Steve Salazar, male   DOB: [birth year], 71 y.o.   MRN: [id number]     Advanced Heart Failure Rounding Note  PCP-Cardiologist: Ida Rogue, MD   Subjective:    7/31: Milrinone d/c 2/2 hypotension. Transitioned to DBA 5 + Lasix gtt at 15/hr. Midodrine added.  8/1: Co-ox 48%, DBA increased to 7.5. Metolazone added to augment diuresis  8/2: Lasix gtt increased to 20/hr + 2.5 metolazone  8/3: NE weaned off. Lasix gtt off. Started PO torsemide.  8/5: NE restarted 8/7: Started on lasix drip at 30. Given tolvaptan.   Now on DBA 7.5 + NE 8 CO-OX 49%  despite increasing NE.   SCr 5.5 -> 5.9 -> 6.1 BUN 143  Remains confused at times but was able to walk the hall today with his RN. Denies SOB, CP, orthopnea or PND. Says he feels comfortable. Having trouble swallowing at times.    Objective:   Weight Range: 87.2 kg Body mass index is 26.07 kg/m.   Vital Signs:   Temp:  [97.7 F (36.5 C)-98.4 F (36.9 C)] 97.7 F (36.5 C) (08/13 0825) Pulse Rate:  [65-169] 118 (08/13 1100) Resp:  [11-27] 27 (08/13 1100) BP: (84-98)/(62-84) 85/72 (08/13 1100) SpO2:  [85 %-100 %] 85 % (08/13 1100) Last BM Date : 12/09/21  Weight change: Filed Weights   12/07/21 0500 12/08/21 0500 12/09/21 0630  Weight: 87.7 kg 88.2 kg 87.2 kg    Intake/Output:   Intake/Output Summary (Last 24 hours) at 12/10/2021 1140 Last data filed at 12/10/2021 1100 Gross per 24 hour  Intake 1320.5 ml  Output 650 ml  Net 670.5 ml       Physical Exam    General:  Sitting in chair No resp difficulty HEENT: normal Neck: supple. JVP to ear Carotids 2+ bilat; no bruits. No lymphadenopathy or thryomegaly appreciated. Cor: PMI nondisplaced. Regular rate & rhythm. 2/6 MR Lungs: clear Abdomen: soft, nontender, nondistended. No hepatosplenomegaly. No bruits or masses. Good bowel sounds. Extremities: no cyanosis, clubbing, rash, 2+ edema Neuro: alert but occasionally confused. Moves all 4 + asterxis      Telemetry   Biv paced 90s Personally reviewed  Labs    CBC Recent Labs    12/09/21 0314 12/10/21 0408  WBC 12.0* 12.5*  HGB 13.0 13.0  HCT 38.3* 36.7*  MCV 85.9 83.8  PLT 227 [id number]    Basic Metabolic Panel Recent Labs    12/09/21 0314 12/10/21 0408  NA 126* 126*  K 4.5 3.7  CL 81* 82*  CO2 28 28  GLUCOSE 147* 105*  BUN 134* 143*  CREATININE 5.91* 6.09*  CALCIUM 9.0 8.4*  PHOS 6.7* 6.9*    Liver Function Tests Recent Labs    12/09/21 0314 12/10/21 0408  AST  --  21  ALT  --  26  ALKPHOS  --  79  BILITOT  --  2.6*  PROT  --  6.0*  ALBUMIN 3.2* 3.2*     No results for input(s): "LIPASE", "AMYLASE" in the last 72 hours. Cardiac Enzymes No results for input(s): "CKTOTAL", "CKMB", "CKMBINDEX", "TROPONINI" in the last 72 hours.  BNP: BNP (last 3 results) No results for input(s): "BNP" in the last 8760 hours.  ProBNP (last 3 results) No results for input(s): "PROBNP" in the last 8760 hours.   D-Dimer No results for input(s): "DDIMER" in the last 72 hours. Hemoglobin A1C No results for input(s): "HGBA1C" in the last 72 hours.  Fasting Lipid Panel No results for input(s): "  CHOL", "HDL", "LDLCALC", "TRIG", "CHOLHDL", "LDLDIRECT" in the last 72 hours.  Thyroid Function Tests No results for input(s): "TSH", "T4TOTAL", "T3FREE", "THYROIDAB" in the last 72 hours.  Invalid input(s): "FREET3"  Other results:   Imaging    No results found.   Medications:     Scheduled Medications:  apixaban  5 mg Oral BID   Chlorhexidine Gluconate Cloth  6 each Topical Daily   feeding supplement  237 mL Oral TID BM   HYDROmorphone  2 mg Intravenous Once   insulin aspart  0-5 Units Subcutaneous QHS   insulin aspart  0-9 Units Subcutaneous TID WC   midodrine  10 mg Oral Q8H    Infusions:  sodium chloride     sodium chloride Stopped (12/04/21 1213)   sodium chloride     sodium chloride     DOBUTamine 7.5 mcg/kg/min (12/10/21 1100)   furosemide  (LASIX) 200 mg in dextrose 5 % 100 mL (2 mg/mL) infusion 30 mg/hr (12/10/21 1100)   HYDROmorphone Stopped (12/09/21 1041)   midazolam Stopped (12/09/21 1041)   norepinephrine (LEVOPHED) Adult infusion 8 mcg/min (12/10/21 1100)    PRN Medications: Place/Maintain arterial line **AND** sodium chloride, Place/Maintain arterial line **AND** sodium chloride, acetaminophen **OR** acetaminophen (TYLENOL) oral liquid 160 mg/5 mL **OR** acetaminophen, alum & mag hydroxide-simeth, mouth rinse    Assessment/Plan   1. CVA: Acute left MCA CVA, likely cardioembolic.  Has dilated cardiomyopathy, also chronic atrial fibrillation. Echo did not show LV thrombus.  He is now s/p thrombectomy with residual small SAH. Per family, he probably was not taking his Eliquis at home.  C/w expressive aphasia, also aspiration risk (on full liquids currently).  - On Eliquis. Statin stopped with transition to more comfort approach and dysphagia 2. Atrial fibrillation: Chronic, s/p AV nodal ablation.  Had successful DCCV in 6/23 but now back in AF.  - Eliquis 5 mg bid   3. AKI on CKD stage 3: Baseline creatinine 1.8-2 range, suspect cardiorenal syndrome with superimposed ATN from hypotension earlier in hospitalization and possible CIN contribution.  - Creatinine plateaued but now trending back up, 5.3>5.54> 5.9 > 6.1 this am. BUN still going up >>117>128> 134> 143 - Has evidence of mild uremia on exam but not as severe as I would have anticipated given his bloodwork 4. 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  Last cath in 12/21 with normal coronaries.  Echo this admission with EF 20%, severe LV dilation, severe biatrial enlargement, moderate RV enlargement with mildly decreased systolic function, mod-severe MR, moderate TR, dilated IVC.  Has St Jude CRT-D device.  He has known low output/end stage HF, was started on milrinone gtt 0.2 at [facility] in 12/22. Refused LVAD.  Now on DBA  7.5 with NE 8 + midodrine . CO-OX 49%. CVP high - Minimal urine output with lasix gtt 30/hr + 5 mg metolazone BID. CVP up. Weight stable  - Not candidate for CVVHD - He has reached end-stage with MSOF.  5. Mitral regurgitation: Moderate-severe functional MR.    6. PVCs/NSVT: Continue amiodarone po.  - keep K > 4.0 and Mg > 2.0   7. Hyponatremia: Hypervolemic hyponatremia. Received Tolvaptan 08/07, 08/08, 08/09, 8/11 - Na stable at 126 today.  8. DNR/DNI - He has reached end-stage with MSOF. - I discussed with patient and his sister at length yesterday.He is having periods of uremia. But symptom burden currently surprisingly low/stable. Will continue current regimen for  now but as symptom burden increases will switch to dilaudid and versed drips for comfort and stop inotropes at that point. Family does not want him to suffer and do not want further excalation of care. - Will need ICD deactivation in am                                                                                                              CRITICAL CARE Performed by: Arvilla Meres  Total critical care time: 35 minutes  Critical care time was exclusive of separately billable procedures and treating other patients.  Critical care was necessary to treat or prevent imminent or life-threatening deterioration.  Critical care was time spent personally by me (independent of midlevel providers or residents) on the following activities: development of treatment plan with patient and/or surrogate as well as nursing, discussions with consultants, evaluation of patient's response to treatment, examination of patient, obtaining history from patient or surrogate, ordering and performing treatments and interventions, ordering and review of laboratory studies, ordering and review of radiographic studies, pulse oximetry and re-evaluation of patient's condition.   Kama Cammarano. 12/10/2021 11:40 AM

## 2021-12-10 NOTE — Progress Notes (Signed)
NAME:  SYRE KNERR, MRN:  [id number], DOB:  1951/01/14, LOS: 17 ADMISSION DATE:  12-18-2021, CONSULTATION DATE: 11/22/2021 REFERRING MD: Dr. 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  Patient remained hypotensive, required initiation of vasopressor, PCCM was consulted for help evaluation and medical management  Pertinent  Medical History   Past Medical History:  Diagnosis Date   CKD (chronic kidney disease), stage II    COVID-19 virus infection 03/2019   HFrEF (heart failure with reduced ejection fraction) (HCC)    a. 2011 Echo: EF 25-30%; b. 10/2016 Echo: EF 20-25%; c. 11/2019 Echo: EF 20-25%, mild LVH, Gr2 DD, sev dil LA, mod dil RA, mod MR, mild to mod AI, mod PR, Asc Ao 57mm.   Hypertension    NICM (nonischemic cardiomyopathy) (HCC)    a. 2011 Echo: EF 25-30%; b. 2011 MV: basal to dist inf perfusion defect->atten; c. 10/2016 Echo: EF 20-25%; d. 11/2019 Echo: EF 20-25%; e. 11/2019 MV: No ischemia. Fixed inflat defect, inflat HK, EF 17%; f. 03/2020 Cath: Nl cors. CO/CI 4.07/1.92.   PSVT (paroxysmal supraventricular tachycardia) (HCC)    a. 12/2016 Event monitor: rare, short episodes of SVT.    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 Renal function and LFTs both elevated  8/10 stable, fixed dose Levophed  added  Interim History / Subjective:   Remains on inotropic support, norepinephrine and dobutamine Symptom management is the only goal  Objective   Blood pressure (!) 87/75, pulse 88, temperature 97.8 F (36.6 C), temperature source Oral, resp. rate (!) 21, height 6' (1.829 m), weight 87.2 kg, SpO2 98 %. CVP:  [3 mmHg-20 mmHg] 13 mmHg      Intake/Output Summary (Last 24 hours) at 12/10/2021 2952 Last data filed at 12/10/2021 0800 Gross per 24 hour  Intake 1529.21 ml  Output 350 ml  Net 1179.21 ml   Filed Weights   12/07/21 0500 12/08/21 0500 12/09/21 0630  Weight: 87.7 kg 88.2 kg 87.2 kg    General: Chronically ill-appearing gentleman, comfortable HEENT: NCAT, tracking appropriately Neuro: Alert to voice, following commands Heart: Regular rate rhythm, S1-S2 Lungs: Diminished breath sounds bilaterally  Resolved Hospital Problem list   Post thrombectomy subarachnoid hemorrhage  Assessment & Plan:  Acute left MCA stroke s/p thrombectomy Dysphagia due to stroke Acute on chronic systolic CHF with cardiogenic shock, cardiorenal syndrome Shock liver Baseline inotrope dependent NICM Chronic atrial fibrillation Hypervolemic hyponatremia Goals of care:  Plan: Patient is pressor dependent inotrope depending heart failure with advancing and worsening renal failure.  Decision was made for palliative care and comfort care transition. We will continue to keep him on pressors and inotropes until he becomes symptomatic from his worsening renal failure. At that point we will transition to full comfort care.  The orders are already in place. Once patient becomes symptomatic we have Dilaudid infusion and Versed for comfort ordered. Family visitation at bedside.  Best Practice (right click and "Reselect all SmartList  Selections" daily)   Diet/type: Dysphagia diet DVT prophylaxis: DOAC GI prophylaxis: N/A Lines: yes and it is still needed as port Foley: trial d/c'ing foley Code  Status:  DNR Last date of multidisciplinary goals of care discussion: see palliative notes  Josephine Igo, DO [facility] 12/10/2021 8:23 AM

## 2021-12-11 DIAGNOSIS — Z515 Encounter for palliative care: Secondary | ICD-10-CM | POA: Diagnosis not present

## 2021-12-11 DIAGNOSIS — Z66 Do not resuscitate: Secondary | ICD-10-CM | POA: Diagnosis not present

## 2021-12-11 DIAGNOSIS — I509 Heart failure, unspecified: Secondary | ICD-10-CM

## 2021-12-11 DIAGNOSIS — I639 Cerebral infarction, unspecified: Secondary | ICD-10-CM | POA: Diagnosis not present

## 2021-12-11 DIAGNOSIS — R570 Cardiogenic shock: Secondary | ICD-10-CM | POA: Diagnosis not present

## 2021-12-11 DIAGNOSIS — I5023 Acute on chronic systolic (congestive) heart failure: Secondary | ICD-10-CM | POA: Diagnosis not present

## 2021-12-11 DIAGNOSIS — I482 Chronic atrial fibrillation, unspecified: Secondary | ICD-10-CM | POA: Diagnosis not present

## 2021-12-11 LAB — RENAL FUNCTION PANEL
Albumin: 3.2 g/dL — ABNORMAL LOW (ref 3.5–5.0)
Anion gap: 19 — ABNORMAL HIGH (ref 5–15)
BUN: 154 mg/dL — ABNORMAL HIGH (ref 8–23)
CO2: 26 mmol/L (ref 22–32)
Calcium: 8.5 mg/dL — ABNORMAL LOW (ref 8.9–10.3)
Chloride: 79 mmol/L — ABNORMAL LOW (ref 98–111)
Creatinine, Ser: 6.17 mg/dL — ABNORMAL HIGH (ref 0.61–1.24)
GFR, Estimated: 9 mL/min — ABNORMAL LOW (ref >60)
Glucose, Bld: 122 mg/dL — ABNORMAL HIGH (ref 70–99)
Phosphorus: 8 mg/dL — ABNORMAL HIGH (ref 2.5–4.6)
Potassium: 3.8 mmol/L (ref 3.5–5.1)
Sodium: 124 mmol/L — ABNORMAL LOW (ref 135–145)

## 2021-12-11 LAB — CBC
HCT: 37.3 % — ABNORMAL LOW (ref 39.0–52.0)
Hemoglobin: 12.9 g/dL — ABNORMAL LOW (ref 13.0–17.0)
MCH: 29.3 pg (ref 26.0–34.0)
MCHC: 34.6 g/dL (ref 30.0–36.0)
MCV: 84.6 fL (ref 80.0–100.0)
Platelets: 211 10*3/uL (ref 150–400)
RBC: 4.41 MIL/uL (ref 4.22–5.81)
RDW: 15.6 % — ABNORMAL HIGH (ref 11.5–15.5)
WBC: 11.7 10*3/uL — ABNORMAL HIGH (ref 4.0–10.5)
nRBC: 0.4 % — ABNORMAL HIGH (ref 0.0–0.2)

## 2021-12-11 LAB — COOXEMETRY PANEL
Carboxyhemoglobin: 1.6 % — ABNORMAL HIGH (ref 0.5–1.5)
Methemoglobin: 2 % — ABNORMAL HIGH (ref 0.0–1.5)
O2 Saturation: 57 %
Total hemoglobin: 13.6 g/dL (ref 12.0–16.0)

## 2021-12-11 LAB — GLUCOSE, CAPILLARY
Glucose-Capillary: 156 mg/dL — ABNORMAL HIGH (ref 70–99)
Glucose-Capillary: 122 mg/dL — ABNORMAL HIGH (ref 70–99)
Glucose-Capillary: 116 mg/dL — ABNORMAL HIGH (ref 70–99)
Glucose-Capillary: 127 mg/dL — ABNORMAL HIGH (ref 70–99)
Glucose-Capillary: 131 mg/dL — ABNORMAL HIGH (ref 70–99)

## 2021-12-11 MED ORDER — TOLVAPTAN 15 MG PO TABS
15.0000 mg | ORAL_TABLET | Freq: Once | ORAL | Status: AC
  Administered 2021-12-11: 15 mg via ORAL
  Filled 2021-12-11: qty 1

## 2021-12-11 NOTE — Progress Notes (Signed)
Patient ID: Steve Salazar, male   DOB: 09/16/1950, 71 y.o.   MRN: [id number]     Advanced Heart Failure Rounding Note  PCP-Cardiologist: Ida Rogue, MD   Subjective:    7/31: Milrinone d/c 2/2 hypotension. Transitioned to DBA 5 + Lasix gtt at 15/hr. Midodrine added.  8/1: Co-ox 48%, DBA increased to 7.5. Metolazone added to augment diuresis  8/2: Lasix gtt increased to 20/hr + 2.5 metolazone  8/3: NE weaned off. Lasix gtt off. Started PO torsemide.  8/5: NE restarted 8/7: Started on lasix drip at 30. Given tolvaptan.   Now on DBA 7.5 + NE 8, co-ox 58%.   SCr 5.5 -> 5.9 -> 6.1-> 6.17 BUN 143 => 154  Still with expressive aphasia.  Able to walk halls, appears comfortable.    UOP 850 cc with Lasix gtt at 30 mg/hr.  CVP 16.    Objective:   Weight Range: 88 kg Body mass index is 26.31 kg/m.   Vital Signs:   Temp:  [96.8 F (36 C)-97.9 F (36.6 C)] 97.5 F (36.4 C) (08/14 0430) Pulse Rate:  [80-143] 89 (08/14 0700) Resp:  [11-32] 21 (08/14 0700) BP: (79-97)/(60-78) 95/77 (08/14 0700) SpO2:  [84 %-100 %] 96 % (08/14 0700) Weight:  [88 kg] 88 kg (08/14 0600) Last BM Date : 12/09/21  Weight change: Filed Weights   12/08/21 0500 12/09/21 0630 12/11/21 0600  Weight: 88.2 kg 87.2 kg 88 kg    Intake/Output:   Intake/Output Summary (Last 24 hours) at 12/11/2021 0744 Last data filed at 12/11/2021 0600 Gross per 24 hour  Intake 1261.64 ml  Output 850 ml  Net 411.64 ml      Physical Exam    General: NAD Neck: JVP 16 cm, no thyromegaly or thyroid nodule.  Lungs: Clear to auscultation bilaterally with normal respiratory effort. CV: Lateral PMI.  Heart regular S1/S2, no S3/S4, 2/6 HSM apex.  1+ edema to thighs.  Abdomen: Soft, nontender, no hepatosplenomegaly, no distention.  Skin: Intact without lesions or rashes.  Neurologic: Alert and oriented x 3. Expressive aphasia.  Psych: Normal affect. Extremities: No clubbing or cyanosis.  HEENT: Normal.    Telemetry    Biv paced 90s Personally reviewed  Labs    CBC Recent Labs    12/10/21 0408 12/11/21 0559  WBC 12.5* 11.7*  HGB 13.0 12.9*  HCT 36.7* 37.3*  MCV 83.8 84.6  PLT 215 [id number]   Basic Metabolic Panel Recent Labs    12/10/21 0408 12/11/21 0559  NA 126* 124*  K 3.7 3.8  CL 82* 79*  CO2 28 26  GLUCOSE 105* 122*  BUN 143* 154*  CREATININE 6.09* 6.17*  CALCIUM 8.4* 8.5*  PHOS 6.9* 8.0*   Liver Function Tests Recent Labs    12/10/21 0408 12/11/21 0559  AST 21  --   ALT 26  --   ALKPHOS 79  --   BILITOT 2.6*  --   PROT 6.0*  --   ALBUMIN 3.2* 3.2*    No results for input(s): "LIPASE", "AMYLASE" in the last 72 hours. Cardiac Enzymes No results for input(s): "CKTOTAL", "CKMB", "CKMBINDEX", "TROPONINI" in the last 72 hours.  BNP: BNP (last 3 results) No results for input(s): "BNP" in the last 8760 hours.  ProBNP (last 3 results) No results for input(s): "PROBNP" in the last 8760 hours.   D-Dimer No results for input(s): "DDIMER" in the last 72 hours. Hemoglobin A1C No results for input(s): "HGBA1C" in the last 72 hours.  Fasting  Lipid Panel No results for input(s): "CHOL", "HDL", "LDLCALC", "TRIG", "CHOLHDL", "LDLDIRECT" in the last 72 hours.  Thyroid Function Tests No results for input(s): "TSH", "T4TOTAL", "T3FREE", "THYROIDAB" in the last 72 hours.  Invalid input(s): "FREET3"  Other results:   Imaging    No results found.   Medications:     Scheduled Medications:  apixaban  5 mg Oral BID   Chlorhexidine Gluconate Cloth  6 each Topical Daily   feeding supplement  237 mL Oral TID BM   HYDROmorphone  2 mg Intravenous Once   insulin aspart  0-5 Units Subcutaneous QHS   insulin aspart  0-9 Units Subcutaneous TID WC   midodrine  10 mg Oral Q8H   tolvaptan  15 mg Oral Once    Infusions:  sodium chloride     sodium chloride Stopped (12/04/21 1213)   sodium chloride     sodium chloride     DOBUTamine 7.5 mcg/kg/min (12/11/21 0600)    furosemide (LASIX) 200 mg in dextrose 5 % 100 mL (2 mg/mL) infusion 30 mg/hr (12/11/21 0600)   HYDROmorphone Stopped (12/09/21 1041)   midazolam Stopped (12/09/21 1041)   norepinephrine (LEVOPHED) Adult infusion 8 mcg/min (12/11/21 0600)    PRN Medications: Place/Maintain arterial line **AND** sodium chloride, Place/Maintain arterial line **AND** sodium chloride, acetaminophen **OR** acetaminophen (TYLENOL) oral liquid 160 mg/5 mL **OR** acetaminophen, alum & mag hydroxide-simeth, mouth rinse    Assessment/Plan   1. CVA: Acute left MCA CVA, likely cardioembolic.  Has dilated cardiomyopathy, also chronic atrial fibrillation. Echo did not show LV thrombus.  He is now s/p thrombectomy with residual small SAH. Per family, he probably was not taking his Eliquis at home.  C/w expressive aphasia, also aspiration risk (on full liquids currently).  - On Eliquis. Statin stopped with transition to more comfort approach and dysphagia 2. Atrial fibrillation: Chronic, s/p AV nodal ablation.  Had successful DCCV in 6/23 but now back in AF.  - Eliquis 5 mg bid   3. AKI on CKD stage 3: Baseline creatinine 1.8-2 range, suspect cardiorenal syndrome with superimposed ATN from hypotension earlier in hospitalization and possible CIN contribution. BUN/creatinine continue to rise, 154/6.17 this morning.  Minimal uremic symptoms so far, some pruritis.  - Not HD candidate with end-stage CHF.  4. 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  Last cath in 12/21 with normal coronaries.  Echo this admission with EF 20%, severe LV dilation, severe biatrial enlargement, moderate RV enlargement with mildly decreased systolic function, mod-severe MR, moderate TR, dilated IVC.  Has St Jude CRT-D device.  He has known low output/end stage HF, was started on milrinone gtt 0.2 at [facility] in 12/22. Refused LVAD.  Now on DBA 7.5 with NE 8 + midodrine . CO-OX 57%. CVP 16, volume overloaded on  exam. Not responding much to Lasix.  - Continue Lasix gtt 30 mg/hr, will not give metolazone today with hyponatremia.  - Continue current dobutamine and NE without escalation.  - Not candidate for CVVHD - He has reached end-stage with MSOF.  5. Mitral regurgitation: Moderate-severe functional MR.    6. PVCs/NSVT: Continue amiodarone po.  - keep K > 4.0 and Mg > 2.0   7. Hyponatremia: Hypervolemic hyponatremia. Received Tolvaptan 08/07, 08/08, 08/09, 8/11. Na 124 today, hypervolemic hyponatremia.  - Dose of tolvaptan today.  8. DNR/DNI: He has reached end-stage with MSOF. - Have discussed with family. Will continue current regimen for now but  as symptom burden increases will switch to comfort gtts and stop inotropes. Family does not want him to suffer and do not want further excalation of care. - Will deactivate ICD tachy therapies today.                                                                                                          CRITICAL CARE Performed by: Marca Ancona  Total critical care time: 35 minutes  Critical care time was exclusive of separately billable procedures and treating other patients.  Critical care was necessary to treat or prevent imminent or life-threatening deterioration.  Critical care was time spent personally by me (independent of midlevel providers or residents) on the following activities: development of treatment plan with patient and/or surrogate as well as nursing, discussions with consultants, evaluation of patient's response to treatment, examination of patient, obtaining history from patient or surrogate, ordering and performing treatments and interventions, ordering and review of laboratory studies, ordering and review of radiographic studies, pulse oximetry and re-evaluation of patient's condition.   Marca Ancona. 12/11/2021 7:44 AM

## 2021-12-11 NOTE — Progress Notes (Addendum)
PCCM:  CCM will sign off. I spoke with Dr. Shirlee Latch  HF Service will continue to support family and patient during his comfort care transition.   Steve Igo, DO [facility] 12/11/2021 8:41 AM

## 2021-12-11 NOTE — Progress Notes (Signed)
Nutrition Brief Note  Chart reviewed. Plans to transition to comfort care.  No further nutrition interventions planned at this time.  Please re-consult as needed.   Romelle Starcher MS, RDN, LDN, CNSC Registered Dietitian 3 Clinical Nutrition RD Pager and On-Call Pager Number Located in [place]

## 2021-12-11 NOTE — Progress Notes (Signed)
Patient ID: [name], male   DOB: Oct 27, 1950, 71 y.o.   MRN: [id number]    Progress Note from the Palliative Medicine Team at [facility]   Patient Name: Steve Salazar        Date: 12/11/2021 DOB: 01-08-1951  Age: 71 y.o. MRN#: [id number] Attending Physician: Laurey Morale, MD Primary Care Physician: [name], MD Admit Date: 11/14/2021   Medical records reviewed   71 y.o. 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 He was admitted on 11/19/2021 with acute left MCA stroke status post thrombectomy, post procedure small subarachnoid hemorrhage, acute on chronic CHF with cardiogenic shock on vasopressors, AKI on CKD, metabolic encephalopathy, shock liver, and others.  This NP assessed patient at the bedside as a follow up for palliative medicine  needs  and emotional support, patient is weak and with intermittent confusion.  I spoke to patient's Sister Jeanella Cara by phone.  She is his main support person and decision maker in the event that he cannot make decisions for himself.  Chane verbalizes a clear understanding of the seriousness of her brother's current medical situation.  She shares with me her conversation with Dr. Gala Romney regarding plan moving forward; can continue current medical treatments for now but his symptom burden increases, life prolonging measures will be weaned and comfort measures will be put in place.  Ultimate hope is for comfort and dignity at end-of-life.  Patient's prognosis is limited  Education offered on hospice benefit; philosophy and eligibility detailed.  Education offered on the natural trajectory and  expectations at end-of-life.  PMT will continue to support holistically and help family navigate end-of-life decisions dependent on outcomes.  Education offered today to sister regarding  the importance of continued conversation with family and the  medical providers regarding overall plan of care and treatment options,  ensuring decisions are within the context of the patients values and GOCs.  Questions and concerns addressed   Discussed with bedside RN    Lorinda Creed NP  Palliative Medicine Team Team Phone # [phone] Pager [phone]

## 2021-12-12 DIAGNOSIS — I5023 Acute on chronic systolic (congestive) heart failure: Secondary | ICD-10-CM | POA: Diagnosis not present

## 2021-12-12 DIAGNOSIS — I482 Chronic atrial fibrillation, unspecified: Secondary | ICD-10-CM | POA: Diagnosis not present

## 2021-12-12 DIAGNOSIS — I639 Cerebral infarction, unspecified: Secondary | ICD-10-CM | POA: Diagnosis not present

## 2021-12-12 DIAGNOSIS — R570 Cardiogenic shock: Secondary | ICD-10-CM | POA: Diagnosis not present

## 2021-12-12 LAB — CBC
HCT: 36.8 % — ABNORMAL LOW (ref 39.0–52.0)
Hemoglobin: 12.8 g/dL — ABNORMAL LOW (ref 13.0–17.0)
MCH: 29.4 pg (ref 26.0–34.0)
MCHC: 34.8 g/dL (ref 30.0–36.0)
MCV: 84.4 fL (ref 80.0–100.0)
Platelets: 177 10*3/uL (ref 150–400)
RBC: 4.36 MIL/uL (ref 4.22–5.81)
RDW: 15.6 % — ABNORMAL HIGH (ref 11.5–15.5)
WBC: 12 10*3/uL — ABNORMAL HIGH (ref 4.0–10.5)
nRBC: 0.4 % — ABNORMAL HIGH (ref 0.0–0.2)

## 2021-12-12 LAB — RENAL FUNCTION PANEL
Albumin: 3.2 g/dL — ABNORMAL LOW (ref 3.5–5.0)
Anion gap: 18 — ABNORMAL HIGH (ref 5–15)
BUN: 158 mg/dL — ABNORMAL HIGH (ref 8–23)
CO2: 28 mmol/L (ref 22–32)
Calcium: 8.3 mg/dL — ABNORMAL LOW (ref 8.9–10.3)
Chloride: 77 mmol/L — ABNORMAL LOW (ref 98–111)
Creatinine, Ser: 6.37 mg/dL — ABNORMAL HIGH (ref 0.61–1.24)
GFR, Estimated: 9 mL/min — ABNORMAL LOW (ref >60)
Glucose, Bld: 160 mg/dL — ABNORMAL HIGH (ref 70–99)
Phosphorus: 8.5 mg/dL — ABNORMAL HIGH (ref 2.5–4.6)
Potassium: 3.5 mmol/L (ref 3.5–5.1)
Sodium: 123 mmol/L — ABNORMAL LOW (ref 135–145)

## 2021-12-12 LAB — COOXEMETRY PANEL
Carboxyhemoglobin: 1.1 % (ref 0.5–1.5)
Methemoglobin: 1.6 % — ABNORMAL HIGH (ref 0.0–1.5)
O2 Saturation: 47.3 %
Total hemoglobin: 13.7 g/dL (ref 12.0–16.0)

## 2021-12-12 LAB — GLUCOSE, CAPILLARY
Glucose-Capillary: 133 mg/dL — ABNORMAL HIGH (ref 70–99)
Glucose-Capillary: 141 mg/dL — ABNORMAL HIGH (ref 70–99)
Glucose-Capillary: 159 mg/dL — ABNORMAL HIGH (ref 70–99)
Glucose-Capillary: 161 mg/dL — ABNORMAL HIGH (ref 70–99)

## 2021-12-12 MED ORDER — OXYCODONE HCL 5 MG PO TABS
5.0000 mg | ORAL_TABLET | ORAL | Status: AC | PRN
  Administered 2021-12-12 – 2021-12-13 (×6): 5 mg via ORAL
  Filled 2021-12-12 (×7): qty 1

## 2021-12-12 NOTE — Progress Notes (Signed)
eLink Physician-Brief Progress Note Patient Name: Steve Salazar DOB: [birth year] MRN: [id number]   Date of Service  12/12/2021  HPI/Events of Note  Patient with pain in bilateral feet.  eICU Interventions  Oxycodone 5 mg po Q 4 hours PRN pain ordered.        [name] 12/12/2021, 2:05 AM

## 2021-12-12 NOTE — Progress Notes (Signed)
Speech Language Pathology Treatment: Dysphagia  Patient Details Name: Steve Salazar MRN: [id number] DOB: 01/21/1951 Today's Date: 12/12/2021 Time: 0950-1005 SLP Time Calculation (min) (ACUTE ONLY): 15 min  Assessment / Plan / Recommendation Clinical Impression  Patient seen by SLP for skilled treatment focused on dysphagia goals. SLP spoke with patient's RN prior to session to inquire about his PO intake and clarify GOC for patient. Per RN and via chart review, patient is now a DNR and with plan for no escalation of interventions but not on full comfort at this time. Plan is for patient to transition to comfort care when patient's symptom burden increases. RN also reported that patient has been drinking Ensure supplement shakes but has been typically not eating any of the solids on his meal trays.  SLP observed patient with PO intake that he requested which was Ensure shake and then he was also agreeable to water. He quickly consumed the Ensure shake with straw sips and did not exhibit any overt s/s aspiration or penetration. He then drank several sips of water without overt s/s. He did have a few instances of belching but this was not prolonged. Patient did not appear in discomfort but when asked how he felt he did trace hand on middle of chest seeming to be referring to his esophagus and/or stomach. He was unable to communicate effectively secondary to his aphasia. SLP recommending to continue with current diet of Dys 2 solids, thin liquids but to consider liberalizing diet when he changes to comfort measures. SLP will continue to follow patient for aphasia to focus on basic communication of needs/wants but will s/o for dysphagia goals.     HPI HPI: 71 y.o. 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      SLP Plan  Discharge SLP treatment due to (comment);All goals met      Recommendations for follow up therapy are one component of a multi-disciplinary discharge planning process, led by the attending physician.  Recommendations may be updated based on patient status, additional functional criteria and insurance authorization.    Recommendations  Diet recommendations: Dysphagia 2 (fine chop);Thin liquid Liquids provided via: Cup;Straw Medication Administration: Whole meds with liquid Supervision: Patient able to self feed;Full supervision/cueing for compensatory strategies Compensations: Small sips/bites;Slow rate Postural Changes and/or Swallow Maneuvers: Seated upright 90 degrees                Oral Care Recommendations: Oral care BID;Staff/trained caregiver to provide oral care Follow Up Recommendations: Follow physician's recommendations for discharge plan and follow up therapies Assistance recommended at discharge: Frequent or constant Supervision/Assistance SLP Visit Diagnosis: Dysphagia, unspecified (R13.10) Plan: Discharge SLP treatment due to (comment);All goals met            T. , MA, CCC-SLP Speech Therapy  

## 2021-12-12 NOTE — Progress Notes (Signed)
Physical Therapy Treatment Patient Details Name: Steve Salazar MRN: [id number] DOB: [birth year] Today's Date: 12/12/2021   History of Present Illness Pt is a 71 y.o. male admitted 10/30/2021 with acute aphasia. Transfer to [facility] for thombectomy of acute L M2 occlusion complicated by small SAH. Pt with inotrope-dependendent HF; not HD or LVAD candidate. Deactivated ICD tachycardia therapies 8/14. PMH includes gout, CKD2, NICM, HF (EF 15-20%), HTN, ICD, afib, PSVT.   PT Comments    Pt with increased fatigue this session; tolerated mobility and ADL tasks in room, noted decreased stability with ambulation compared to prior sessions. Pt remains limited by generalized weakness, decreased activity tolerance, poor balance strategies/postural reactions and impaired cognition. Unclear if pt transitioning to full comfort measures or not. Will continue to follow acutely to address established goals as appropriate.     Recommendations for follow up therapy are one component of a multi-disciplinary discharge planning process, led by the attending physician.  Recommendations may be updated based on patient status, additional functional criteria and insurance authorization.  Follow Up Recommendations  Skilled nursing-short term rehab (<3 hours/day) Can patient physically be transported by private vehicle: Yes   Assistance Recommended at Discharge Frequent or constant Supervision/Assistance  Patient can return home with the following Assistance with cooking/housework;Direct supervision/assist for medications management;Assist for transportation;Direct supervision/assist for financial management;Help with stairs or ramp for entrance;A little help with walking and/or transfers;A little help with bathing/dressing/bathroom   Equipment Recommendations  None recommended by PT    Recommendations for Other Services       Precautions / Restrictions Precautions Precautions: Fall;Other (comment) Precaution Comments:  aphasia, R inattention; watch BP Restrictions Weight Bearing Restrictions: No     Mobility  Bed Mobility               General bed mobility comments: received sitting in recliner    Transfers Overall transfer level: Needs assistance Equipment used: None Transfers: Sit to/from Stand Sit to Stand: Min guard           General transfer comment: increased time to follow command to 'stand up', heavy reliance on UE support to push/pull to standing from recliner and low toilet seat, close min guard due to instability and fatigue    Ambulation/Gait Ambulation/Gait assistance: Min guard, Min assist Gait Distance (Feet): 40 Feet Assistive device: None, IV Pole Gait Pattern/deviations: (P) Step-through pattern, Decreased stride length       General Gait Details: pt reaching for UE support on IV pole and wall due to significant instability, min guard to minA for balance; fatigue limiting ambulation outside of room   Stairs             Wheelchair Mobility    Modified Rankin (Stroke Patients Only)       Balance Overall balance assessment: Mild deficits observed, not formally tested   Sitting balance-Leahy Scale: Good     Standing balance support: During functional activity Standing balance-Leahy Scale: Poor                              Cognition Arousal/Alertness: Awake/alert Behavior During Therapy: Flat affect Overall Cognitive Status: Impaired/Different from baseline Area of Impairment: Safety/judgement, Attention, Problem solving, Memory                   Current Attention Level: Sustained Memory: Decreased short-term memory Following Commands: Follows one step commands inconsistently, Follows one step commands with increased time, Follows multi-step commands inconsistently  Safety/Judgement: Decreased awareness of deficits Awareness: Intellectual Problem Solving: Slow processing, Requires verbal cues, Requires tactile cues General  Comments: following majority of simple commands, though delayed response time; increased fatigue this session        Exercises      General Comments        Pertinent Vitals/Pain Pain Assessment Pain Assessment: Faces Faces Pain Scale: Hurts a little bit Pain Location: bilateral feet Pain Descriptors / Indicators: Discomfort, Grimacing Pain Intervention(s): Monitored during session    Home Living                          Prior Function            PT Goals (current goals can now be found in the care plan section) Progress towards PT goals: Progressing toward goals    Frequency    Min 2X/week      PT Plan Current plan remains appropriate    Co-evaluation              AM-PAC PT "6 Clicks" Mobility   Outcome Measure  Help needed turning from your back to your side while in a flat bed without using bedrails?: A Little Help needed moving from lying on your back to sitting on the side of a flat bed without using bedrails?: A Little Help needed moving to and from a bed to a chair (including a wheelchair)?: A Little Help needed standing up from a chair using your arms (e.g., wheelchair or bedside chair)?: A Little Help needed to walk in hospital room?: A Little Help needed climbing 3-5 steps with a railing? : A Little 6 Click Score: 18    End of Session Equipment Utilized During Treatment: Gait belt Activity Tolerance: Patient limited by fatigue Patient left: in chair;with call bell/phone within reach;with chair alarm set;with nursing/sitter in room Nurse Communication: Mobility status PT Visit Diagnosis: Other symptoms and signs involving the nervous system (R29.898);Other abnormalities of gait and mobility (R26.89);Difficulty in walking, not elsewhere classified (R26.2)     Time: 9643-8381 PT Time Calculation (min) (ACUTE ONLY): 23 min  Charges:  $Therapeutic Activity: 8-22 mins                     Ina Homes, PT, DPT Acute Rehabilitation  Services  Personal: Secure Chat Rehab Office: [phone]  Malachy Chamber 12/12/2021, 5:08 PM

## 2021-12-12 NOTE — Progress Notes (Signed)
Patient ID: Steve Salazar, male   DOB: Jan 09, 1951, 71 y.o.   MRN: [id number]     Advanced Heart Failure Rounding Note  PCP-Cardiologist: [name], MD   Subjective:    7/31: Milrinone d/c 2/2 hypotension. Transitioned to DBA 5 + Lasix gtt at 15/hr. Midodrine added.  8/1: Co-ox 48%, DBA increased to 7.5. Metolazone added to augment diuresis  8/2: Lasix gtt increased to 20/hr + 2.5 metolazone  8/3: NE weaned off. Lasix gtt off. Started PO torsemide.  8/5: NE restarted 8/7: Started on lasix drip at 30. Given tolvaptan.   Now on DBA 7.5 + NE 8, co-ox 47%.   SCr 5.5 -> 5.9 -> 6.1-> 6.17 -> 6.31 BUN 143 => 154 => 158  Still with expressive aphasia.  Able to walk halls still, appears comfortable.    UOP 455 cc with Lasix gtt at 30 mg/hr.  CVP 19.    Objective:   Weight Range: 88.6 kg Body mass index is 26.49 kg/m.   Vital Signs:   Temp:  [96.6 F (35.9 C)-97.5 F (36.4 C)] 97.5 F (36.4 C) (08/14 2300) Pulse Rate:  [86-182] 105 (08/15 0600) Resp:  [11-24] 15 (08/15 0600) BP: (78-105)/(61-81) 93/80 (08/15 0600) SpO2:  [90 %-100 %] 99 % (08/15 0600) Weight:  [88.6 kg] 88.6 kg (08/15 0500) Last BM Date : 12/12/21  Weight change: Filed Weights   12/09/21 0630 12/11/21 0600 12/12/21 0500  Weight: 87.2 kg 88 kg 88.6 kg    Intake/Output:   Intake/Output Summary (Last 24 hours) at 12/12/2021 0735 Last data filed at 12/12/2021 0600 Gross per 24 hour  Intake 1194.11 ml  Output 455 ml  Net 739.11 ml      Physical Exam    General: NAD Neck: JVP 16+, no thyromegaly or thyroid nodule.  Lungs: Clear to auscultation bilaterally with normal respiratory effort. CV: Nondisplaced PMI.  Heart regular S1/S2, no S3/S4, no murmur.  1+ edema to knees. Abdomen: Soft, nontender, no hepatosplenomegaly, no distention.  Skin: Intact without lesions or rashes.  Neurologic: Alert and oriented x 3.  Psych: Normal affect. Extremities: No clubbing or cyanosis.  HEENT: Normal.     Telemetry   Biv paced 90s Personally reviewed  Labs    CBC Recent Labs    12/11/21 0559 12/12/21 0532  WBC 11.7* 12.0*  HGB 12.9* 12.8*  HCT 37.3* 36.8*  MCV 84.6 84.4  PLT 211 177   Basic Metabolic Panel Recent Labs    87/86/76 0559 12/11/21 1809 12/12/21 0532  NA 124* 125* 123*  K 3.8  --  3.5  CL 79*  --  77*  CO2 26  --  28  GLUCOSE 122*  --  160*  BUN 154*  --  158*  CREATININE 6.17*  --  6.37*  CALCIUM 8.5*  --  8.3*  PHOS 8.0*  --  8.5*   Liver Function Tests Recent Labs    12/10/21 0408 12/11/21 0559 12/12/21 0532  AST 21  --   --   ALT 26  --   --   ALKPHOS 79  --   --   BILITOT 2.6*  --   --   PROT 6.0*  --   --   ALBUMIN 3.2* 3.2* 3.2*    No results for input(s): "LIPASE", "AMYLASE" in the last 72 hours. Cardiac Enzymes No results for input(s): "CKTOTAL", "CKMB", "CKMBINDEX", "TROPONINI" in the last 72 hours.  BNP: BNP (last 3 results) No results for input(s): "BNP" in the  last 8760 hours.  ProBNP (last 3 results) No results for input(s): "PROBNP" in the last 8760 hours.   D-Dimer No results for input(s): "DDIMER" in the last 72 hours. Hemoglobin A1C No results for input(s): "HGBA1C" in the last 72 hours.  Fasting Lipid Panel No results for input(s): "CHOL", "HDL", "LDLCALC", "TRIG", "CHOLHDL", "LDLDIRECT" in the last 72 hours.  Thyroid Function Tests No results for input(s): "TSH", "T4TOTAL", "T3FREE", "THYROIDAB" in the last 72 hours.  Invalid input(s): "FREET3"  Other results:   Imaging    No results found.   Medications:     Scheduled Medications:  apixaban  5 mg Oral BID   Chlorhexidine Gluconate Cloth  6 each Topical Daily   feeding supplement  237 mL Oral TID BM   HYDROmorphone  2 mg Intravenous Once   insulin aspart  0-5 Units Subcutaneous QHS   insulin aspart  0-9 Units Subcutaneous TID WC   midodrine  10 mg Oral Q8H    Infusions:  sodium chloride     sodium chloride Stopped (12/04/21 1213)    sodium chloride     sodium chloride     DOBUTamine 7.5 mcg/kg/min (12/12/21 0500)   furosemide (LASIX) 200 mg in dextrose 5 % 100 mL (2 mg/mL) infusion 30 mg/hr (12/12/21 0517)   HYDROmorphone Stopped (12/09/21 1041)   midazolam Stopped (12/09/21 1041)   norepinephrine (LEVOPHED) Adult infusion 8 mcg/min (12/12/21 0729)    PRN Medications: Place/Maintain arterial line **AND** sodium chloride, Place/Maintain arterial line **AND** sodium chloride, acetaminophen **OR** acetaminophen (TYLENOL) oral liquid 160 mg/5 mL **OR** acetaminophen, alum & mag hydroxide-simeth, mouth rinse, oxyCODONE    Assessment/Plan   1. CVA: Acute left MCA CVA, likely cardioembolic.  Has dilated cardiomyopathy, also chronic atrial fibrillation. Echo did not show LV thrombus.  He is now s/p thrombectomy with residual small SAH. Per family, he probably was not taking his Eliquis at home.  C/w expressive aphasia, also aspiration risk (on full liquids currently).  - On Eliquis. Statin stopped with transition to more comfort approach and dysphagia 2. Atrial fibrillation: Chronic, s/p AV nodal ablation.  Had successful DCCV in 6/23 but now back in AF.  - Eliquis 5 mg bid   3. AKI on CKD stage 3: Baseline creatinine 1.8-2 range, suspect cardiorenal syndrome with superimposed ATN from hypotension earlier in hospitalization and possible CIN contribution. BUN/creatinine continue to rise, 158/6.31 this morning.  Mildly confused.   - Not HD candidate with end-stage CHF.  4. 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  Last cath in 12/21 with normal coronaries.  Echo this admission with EF 20%, severe LV dilation, severe biatrial enlargement, moderate RV enlargement with mildly decreased systolic function, mod-severe MR, moderate TR, dilated IVC.  Has [employer] Jude CRT-D device.  He has known low output/end stage HF, was started on milrinone gtt 0.2 at [facility] in 12/22. Refused LVAD.  Now on DBA  7.5 with NE 8 + midodrine. CO-OX 47%. CVP 19, volume overloaded on exam. Not responding much to Lasix (only 455 cc).  - Continue Lasix gtt 30 mg/hr, will not give metolazone today with hyponatremia.  - Continue current dobutamine and NE without escalation.  - Not candidate for CVVHD - He has reached end-stage with MSOF.  5. Mitral regurgitation: Moderate-severe functional MR.    6. PVCs/NSVT: Continue amiodarone po.  - keep K > 4.0 and Mg > 2.0   7. Hyponatremia: Hypervolemic hyponatremia. Received Tolvaptan  08/07, 08/08, 08/09, 8/11. Na 123 today, hypervolemic hyponatremia.  Tolvaptan not having effect with worsening renal dysfunction.  8. DNR/DNI: He has reached end-stage with MSOF. - Have discussed with family. Will continue current regimen for now but as symptom burden increases will switch to comfort gtts and stop inotropes. Family does not want him to suffer and do not want further excalation of care.  ICD tachy therapies are off.                                                                                                         CRITICAL CARE Performed by: Loralie Champagne  Total critical care time: 35 minutes  Critical care time was exclusive of separately billable procedures and treating other patients.  Critical care was necessary to treat or prevent imminent or life-threatening deterioration.  Critical care was time spent personally by me (independent of midlevel providers or residents) on the following activities: development of treatment plan with patient and/or surrogate as well as nursing, discussions with consultants, evaluation of patient's response to treatment, examination of patient, obtaining history from patient or surrogate, ordering and performing treatments and interventions, ordering and review of laboratory studies, ordering and review of radiographic studies, pulse oximetry and re-evaluation of patient's condition.   Loralie Champagne. 12/12/2021 7:35 AM

## 2021-12-13 LAB — GLUCOSE, CAPILLARY
Glucose-Capillary: 131 mg/dL — ABNORMAL HIGH (ref 70–99)
Glucose-Capillary: 139 mg/dL — ABNORMAL HIGH (ref 70–99)
Glucose-Capillary: 135 mg/dL — ABNORMAL HIGH (ref 70–99)
Glucose-Capillary: 136 mg/dL — ABNORMAL HIGH (ref 70–99)
Glucose-Capillary: 122 mg/dL — ABNORMAL HIGH (ref 70–99)
Glucose-Capillary: 103 mg/dL — ABNORMAL HIGH (ref 70–99)

## 2021-12-13 LAB — RENAL FUNCTION PANEL
Albumin: 3.3 g/dL — ABNORMAL LOW (ref 3.5–5.0)
Anion gap: 18 — ABNORMAL HIGH (ref 5–15)
BUN: 162 mg/dL — ABNORMAL HIGH (ref 8–23)
CO2: 30 mmol/L (ref 22–32)
Calcium: 8.7 mg/dL — ABNORMAL LOW (ref 8.9–10.3)
Chloride: 76 mmol/L — ABNORMAL LOW (ref 98–111)
Creatinine, Ser: 6.32 mg/dL — ABNORMAL HIGH (ref 0.61–1.24)
GFR, Estimated: 9 mL/min — ABNORMAL LOW (ref >60)
Glucose, Bld: 128 mg/dL — ABNORMAL HIGH (ref 70–99)
Phosphorus: 8.6 mg/dL — ABNORMAL HIGH (ref 2.5–4.6)
Potassium: 3.3 mmol/L — ABNORMAL LOW (ref 3.5–5.1)
Sodium: 124 mmol/L — ABNORMAL LOW (ref 135–145)

## 2021-12-13 LAB — CBC
HCT: 38.5 % — ABNORMAL LOW (ref 39.0–52.0)
Hemoglobin: 13 g/dL (ref 13.0–17.0)
MCH: 28.9 pg (ref 26.0–34.0)
MCHC: 33.8 g/dL (ref 30.0–36.0)
MCV: 85.6 fL (ref 80.0–100.0)
Platelets: 186 10*3/uL (ref 150–400)
RBC: 4.5 MIL/uL (ref 4.22–5.81)
RDW: 15.4 % (ref 11.5–15.5)
WBC: 11.1 10*3/uL — ABNORMAL HIGH (ref 4.0–10.5)
nRBC: 0.6 % — ABNORMAL HIGH (ref 0.0–0.2)

## 2021-12-13 LAB — COOXEMETRY PANEL
Carboxyhemoglobin: 0.9 % (ref 0.5–1.5)
Methemoglobin: 1 % (ref 0.0–1.5)
O2 Saturation: 44.4 %
Total hemoglobin: 13.5 g/dL (ref 12.0–16.0)

## 2021-12-13 NOTE — Progress Notes (Addendum)
Patient ID: Steve Salazar, male   DOB: 09-15-1950, 71 y.o.   MRN: [id number]     Advanced Heart Failure Rounding Note  PCP-Cardiologist: [name], MD   Subjective:    7/31: Milrinone d/c 2/2 hypotension. Transitioned to DBA 5 + Lasix gtt at 15/hr. Midodrine added.  8/1: Co-ox 48%, DBA increased to 7.5. Metolazone added to augment diuresis  8/2: Lasix gtt increased to 20/hr + 2.5 metolazone  8/3: NE weaned off. Lasix gtt off. Started PO torsemide.  8/5: NE restarted 8/7: Started on lasix drip at 30. Given tolvaptan.   Now on DBA 7.5 + NE 8, co-ox 44%.   SCr 5.5 -> 5.9 -> 6.1-> 6.17 -> 6.31->6.3  BUN 143 => 154 => 158=>162   UOP 460 cc with Lasix gtt at 30 mg/hr.    Not   Objective:   Weight Range: 88.6 kg Body mass index is 26.49 kg/m.   Vital Signs:   Temp:  [96.5 F (35.8 C)-96.7 F (35.9 C)] 96.5 F (35.8 C) (08/16 0100) Pulse Rate:  [71-198] 89 (08/16 0700) Resp:  [0-22] 19 (08/16 0700) BP: (74-115)/(58-89) 88/78 (08/16 0700) SpO2:  [61 %-100 %] 92 % (08/16 0700) Last BM Date : 12/12/21  Weight change: Filed Weights   12/09/21 0630 12/11/21 0600 12/12/21 0500  Weight: 87.2 kg 88 kg 88.6 kg    Intake/Output:   Intake/Output Summary (Last 24 hours) at 12/13/2021 0753 Last data filed at 12/13/2021 0500 Gross per 24 hour  Intake 1550.03 ml  Output 460 ml  Net 1090.03 ml      Physical Exam   CVp 16  General:  Appears weak.No resp difficulty HEENT: normal Neck: supple. JVP to jaw . Carotids 2+ bilat; no bruits. No lymphadenopathy or thryomegaly appreciated. Cor: PMI nondisplaced. Regular rate & rhythm. No rubs, gallops or murmurs. Lungs: clear Abdomen: soft, nontender, nondistended. No hepatosplenomegaly. No bruits or masses. Good bowel sounds. Extremities: no cyanosis, clubbing, rash, edema Neuro: alert & oriented to self. cranial nerves grossly intact. moves all 4 extremities w/o difficulty. Affect flat   Telemetry   BiV 90s   Labs     CBC Recent Labs    12/12/21 0532 12/13/21 0513  WBC 12.0* 11.1*  HGB 12.8* 13.0  HCT 36.8* 38.5*  MCV 84.4 85.6  PLT 177 186   Basic Metabolic Panel Recent Labs    38/25/05 0532 12/13/21 0513  NA 123* 124*  K 3.5 3.3*  CL 77* 76*  CO2 28 30  GLUCOSE 160* 128*  BUN 158* 162*  CREATININE 6.37* 6.32*  CALCIUM 8.3* 8.7*  PHOS 8.5* 8.6*   Liver Function Tests Recent Labs    12/12/21 0532 12/13/21 0513  ALBUMIN 3.2* 3.3*    No results for input(s): "LIPASE", "AMYLASE" in the last 72 hours. Cardiac Enzymes No results for input(s): "CKTOTAL", "CKMB", "CKMBINDEX", "TROPONINI" in the last 72 hours.  BNP: BNP (last 3 results) No results for input(s): "BNP" in the last 8760 hours.  ProBNP (last 3 results) No results for input(s): "PROBNP" in the last 8760 hours.   D-Dimer No results for input(s): "DDIMER" in the last 72 hours. Hemoglobin A1C No results for input(s): "HGBA1C" in the last 72 hours.  Fasting Lipid Panel No results for input(s): "CHOL", "HDL", "LDLCALC", "TRIG", "CHOLHDL", "LDLDIRECT" in the last 72 hours.  Thyroid Function Tests No results for input(s): "TSH", "T4TOTAL", "T3FREE", "THYROIDAB" in the last 72 hours.  Invalid input(s): "FREET3"  Other results:   Imaging  No results found.   Medications:     Scheduled Medications:  apixaban  5 mg Oral BID   Chlorhexidine Gluconate Cloth  6 each Topical Daily   feeding supplement  237 mL Oral TID BM   HYDROmorphone  2 mg Intravenous Once   insulin aspart  0-5 Units Subcutaneous QHS   insulin aspart  0-9 Units Subcutaneous TID WC   midodrine  10 mg Oral Q8H    Infusions:  sodium chloride     sodium chloride Stopped (12/04/21 1213)   sodium chloride     sodium chloride     DOBUTamine 7.5 mcg/kg/min (12/13/21 0500)   furosemide (LASIX) 200 mg in dextrose 5 % 100 mL (2 mg/mL) infusion 30 mg/hr (12/13/21 0500)   HYDROmorphone Stopped (12/09/21 1041)   midazolam Stopped (12/09/21  1041)   norepinephrine (LEVOPHED) Adult infusion 8 mcg/min (12/13/21 0618)    PRN Medications: Place/Maintain arterial line **AND** sodium chloride, Place/Maintain arterial line **AND** sodium chloride, acetaminophen **OR** acetaminophen (TYLENOL) oral liquid 160 mg/5 mL **OR** acetaminophen, alum & mag hydroxide-simeth, mouth rinse, oxyCODONE    Assessment/Plan   1. CVA: Acute left MCA CVA, likely cardioembolic.  Has dilated cardiomyopathy, also chronic atrial fibrillation. Echo did not show LV thrombus.  He is now s/p thrombectomy with residual small SAH. Per family, he probably was not taking his Eliquis at home.  C/w expressive aphasia, also aspiration risk (on full liquids currently).  - On Eliquis. Statin stopped with transition to more comfort approach and dysphagia 2. Atrial fibrillation: Chronic, s/p AV nodal ablation.  Had successful DCCV in 6/23 but now back in AF. Rate controlled  - Eliquis 5 mg bid   3. AKI on CKD stage 3: Baseline creatinine 1.8-2 range, suspect cardiorenal syndrome with superimposed ATN from hypotension earlier in hospitalization and possible CIN contribution. BUN/creatinine continue to rise, 162/6.31 this morning.     - Not HD candidate with end-stage CHF.  4. 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  Last cath in 12/21 with normal coronaries.  Echo this admission with EF 20%, severe LV dilation, severe biatrial enlargement, moderate RV enlargement with mildly decreased systolic function, mod-severe MR, moderate TR, dilated IVC.  Has St Jude CRT-D device.  He has known low output/end stage HF, was started on milrinone gtt 0.2 at [facility] in 12/22. Refused LVAD.  Now on DBA 7.5 with NE 8 + midodrine. CO-OX 44%. CVP 16  . Not responding much to Lasix (only 460  cc).  - Continue Lasix gtt 30 mg/hr, will not give metolazone today with hyponatremia.  - Continue current dobutamine and NE without escalation.  - Not  candidate for CVVHD - He has reached end-stage with MSOF.  5. Mitral regurgitation: Moderate-severe functional MR.    6. PVCs/NSVT: Continue amiodarone po.  - keep K > 4.0 and Mg > 2.0   7. Hyponatremia: Hypervolemic hyponatremia. Received Tolvaptan 08/07, 08/08, 08/09, 8/11. Na 124 today, hypervolemic hyponatremia.  Tolvaptan not having effect with worsening renal dysfunction.  8. DNR/DNI: He has reached end-stage with MSOF. - Have discussed with family. Will continue current regimen for now but as symptom burden increases will switch to comfort gtts and stop inotropes. Family does not want him to suffer and do not want further excalation of care.  ICD tachy therapies are off.  Amy Clegg NP-C  12/13/2021 7:53 AM  Patient seen with NP, agree with the above note.   Renal function continues to worsen, only 460 cc UOP on Lasix gtt 30 mg/hr.  Co-ox 44%.  Per family, more confused.   General: Well appearing this am. NAD.  HEENT: Normal. Neck: Supple, JVP 16 cm. Carotids OK.  Cardiac:  Mechanical heart sounds with LVAD hum present.  Lungs:  CTAB, normal effort.  Abdomen:  NT, ND, no HSM. No bruits or masses. +BS  LVAD exit site: Well-healed and incorporated. Dressing dry and intact. No erythema or drainage. Stabilization device present and accurately applied. Driveline dressing changed daily per sterile technique. Extremities:  Warm and dry. No cyanosis, clubbing, rash. 2+ edema to knees.  Neuro: Expressive aphasia.   Will continue current regimen (dobutamine/NE/Lasix) for now but as symptom burden increases will switch to comfort gtts and stop inotropes. Family does not want him to suffer and do not want further excalation of care but have not been ready for full comfort management.  ICD tachy therapies are off.     Loralie Champagne 12/13/2021 8:39 AM

## 2021-12-14 DIAGNOSIS — G9340 Encephalopathy, unspecified: Secondary | ICD-10-CM

## 2021-12-14 DIAGNOSIS — I130 Hypertensive heart and chronic kidney disease with heart failure and stage 1 through stage 4 chronic kidney disease, or unspecified chronic kidney disease: Secondary | ICD-10-CM

## 2021-12-14 DIAGNOSIS — Z515 Encounter for palliative care: Secondary | ICD-10-CM | POA: Diagnosis not present

## 2021-12-14 DIAGNOSIS — I509 Heart failure, unspecified: Secondary | ICD-10-CM | POA: Diagnosis not present

## 2021-12-14 LAB — CBC
HCT: 37 % — ABNORMAL LOW (ref 39.0–52.0)
Hemoglobin: 12.8 g/dL — ABNORMAL LOW (ref 13.0–17.0)
MCH: 29.2 pg (ref 26.0–34.0)
MCHC: 34.6 g/dL (ref 30.0–36.0)
MCV: 84.3 fL (ref 80.0–100.0)
Platelets: 162 10*3/uL (ref 150–400)
RBC: 4.39 MIL/uL (ref 4.22–5.81)
RDW: 15.7 % — ABNORMAL HIGH (ref 11.5–15.5)
WBC: 11.4 10*3/uL — ABNORMAL HIGH (ref 4.0–10.5)
nRBC: 0.4 % — ABNORMAL HIGH (ref 0.0–0.2)

## 2021-12-14 LAB — RENAL FUNCTION PANEL
Albumin: 3.2 g/dL — ABNORMAL LOW (ref 3.5–5.0)
Anion gap: 18 — ABNORMAL HIGH (ref 5–15)
BUN: 167 mg/dL — ABNORMAL HIGH (ref 8–23)
CO2: 32 mmol/L (ref 22–32)
Calcium: 8.6 mg/dL — ABNORMAL LOW (ref 8.9–10.3)
Chloride: 73 mmol/L — ABNORMAL LOW (ref 98–111)
Creatinine, Ser: 6.89 mg/dL — ABNORMAL HIGH (ref 0.61–1.24)
GFR, Estimated: 8 mL/min — ABNORMAL LOW (ref >60)
Glucose, Bld: 138 mg/dL — ABNORMAL HIGH (ref 70–99)
Phosphorus: 9.3 mg/dL — ABNORMAL HIGH (ref 2.5–4.6)
Potassium: 3.6 mmol/L (ref 3.5–5.1)
Sodium: 123 mmol/L — ABNORMAL LOW (ref 135–145)

## 2021-12-14 LAB — COOXEMETRY PANEL
Carboxyhemoglobin: 1.4 % (ref 0.5–1.5)
Methemoglobin: 1.1 % (ref 0.0–1.5)
O2 Saturation: 48.5 %
Total hemoglobin: 13.1 g/dL (ref 12.0–16.0)

## 2021-12-14 LAB — GLUCOSE, CAPILLARY
Glucose-Capillary: 151 mg/dL — ABNORMAL HIGH (ref 70–99)
Glucose-Capillary: 110 mg/dL — ABNORMAL HIGH (ref 70–99)
Glucose-Capillary: 144 mg/dL — ABNORMAL HIGH (ref 70–99)
Glucose-Capillary: 109 mg/dL — ABNORMAL HIGH (ref 70–99)

## 2021-12-14 MED ORDER — SODIUM CHLORIDE 0.9 % IV SOLN
4.0000 mg/h | INTRAVENOUS | Status: AC
  Administered 2021-12-14: 2 mg/h via INTRAVENOUS
  Administered 2021-12-15: 4 mg/h via INTRAVENOUS
  Administered 2021-12-15: 2 mg/h via INTRAVENOUS
  Filled 2021-12-14 (×4): qty 2.5

## 2021-12-14 MED ORDER — POLYETHYLENE GLYCOL 3350 17 G PO PACK
17.0000 g | PACK | Freq: Every day | ORAL | Status: DC | PRN
  Filled 2021-12-14: qty 1

## 2021-12-14 MED ORDER — HYDROMORPHONE BOLUS VIA INFUSION
2.0000 mg | Freq: Once | INTRAVENOUS | Status: AC
  Administered 2021-12-14: 2 mg via INTRAVENOUS
  Filled 2021-12-14: qty 2

## 2021-12-14 MED ORDER — FENTANYL 2500MCG IN NS 250ML (10MCG/ML) PREMIX INFUSION
10.0000 ug/h | INTRAVENOUS | Status: DC
  Administered 2021-12-14: 10 ug/h via INTRAVENOUS
  Filled 2021-12-14: qty 250

## 2021-12-14 MED ORDER — LACTULOSE 10 GM/15ML PO SOLN
20.0000 g | Freq: Two times a day (BID) | ORAL | Status: DC | PRN
  Filled 2021-12-14: qty 30

## 2021-12-14 MED ORDER — FENTANYL CITRATE PF 50 MCG/ML IJ SOSY
25.0000 ug | PREFILLED_SYRINGE | INTRAMUSCULAR | Status: DC | PRN
  Administered 2021-12-14 – 2021-12-15 (×2): 25 ug via INTRAVENOUS
  Filled 2021-12-14: qty 1

## 2021-12-14 MED ORDER — SODIUM CHLORIDE 0.9 % IV SOLN: 25.0000 mg | Freq: Four times a day (QID) | INTRAVENOUS | Status: DC | PRN

## 2021-12-14 MED ORDER — OXYCODONE HCL 5 MG PO TABS
5.0000 mg | ORAL_TABLET | ORAL | Status: DC | PRN
  Administered 2021-12-14: 5 mg via ORAL

## 2021-12-14 NOTE — Progress Notes (Signed)
Patient ID: SKYLUR FUSTON, male   DOB: 1951-01-16, 71 y.o.   MRN: [id number]    Progress Note from the Palliative Medicine Team at [facility]   Patient Name: Steve Salazar        Date: 12/14/2021 DOB: 1951/03/25  Age: 71 y.o. MRN#: [id number] Attending Physician: Larey Dresser, MD Primary Care Physician: Marygrace Drought, MD Admit Date: 11/14/2021   Medical records reviewed   71 y.o. 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 He was admitted on 11/20/2021 with acute left MCA stroke status post thrombectomy, post procedure small subarachnoid hemorrhage, acute on chronic CHF with cardiogenic shock on vasopressors, AKI on CKD, metabolic encephalopathy, shock liver, and others.  I left a message for patient's sister today hoping to speak with her regarding plan of care and let nursing know that if family arrived at the bedside to contact me.  Late in the afternoon 5 PM I went to the bedside, assessed the patient and met with patient's sister/Steve Salazar and brother/ Steve Salazar for continued conversation regarding patient's current medical situation.  Patient appears generally uncomfortable, he is confused, abdomen is distended RRR with complaints of nausea, BUN 168, creatinine 6.89.  On further clarification of decision-maker Steve Salazar to me that patient's 3 children are his decision makers, she has been acting as a conduit for information.  Education offered on patient's further decline and that at this point current medical treatments are not outweighing the symptom burden, and shift to comfort needs to be considered.   Education offered regarding the  difference between an aggressive medical intervention path and a palliative comfort path for this patient.    A comfort path focuses on comfort and dignity at end-of-life, allowing a natural death.  Symptom management is priority.  No further diagnostics, lab work, pressors or monitoring is necessary.  I shared with the patient's brother and sister at bedside that patient's prognosis is limited regardless of path chosen to likely days to weeks.  Expect hospital death.   Education offered on hospice benefit; philosophy and eligibility detailed.  I was able to get in contact with patient's son/  Steve Salazar,  who is on his way to the hospital and will arrive at approximately 545 ,unfortunately I have a scheduled family meeting at the same time and cannot be present.  I notified heart failure team, hoping to ensure provider presents for ongoing conversation regarding goals of care.    Stressed the importance of continued family conversation with the medical providers regarding overall plan of care and treatment options,  ensuring decisions are within the context of the patients values and GOCs.  Questions and concerns addressed   Discussed with bedside RN    Wadie Lessen NP  Palliative Medicine Team Team Phone # [phone] Pager [phone]

## 2021-12-14 NOTE — Progress Notes (Addendum)
Pt came to unit as a comfort care measures. Family member at the bedside. Pt resting comfortably with controlled continuous pain medication been administered. Charma Igo, LPN [id number]/76/8115

## 2021-12-14 NOTE — Progress Notes (Signed)
Patient ID: Steve Salazar, male   DOB: 10-18-1950, 71 y.o.   MRN: [id number]     Advanced Heart Failure Rounding Note  PCP-Cardiologist: [name], MD   Subjective:    7/31: Milrinone d/c 2/2 hypotension. Transitioned to DBA 5 + Lasix gtt at 15/hr. Midodrine added.  8/1: Co-ox 48%, DBA increased to 7.5. Metolazone added to augment diuresis  8/2: Lasix gtt increased to 20/hr + 2.5 metolazone  8/3: NE weaned off. Lasix gtt off. Started PO torsemide.  8/5: NE restarted 8/7: Started on lasix drip at 30. Given tolvaptan.   Now on DBA 7.5 + NE 8, co-ox 48.5%.   SCr 5.5 -> 5.9 -> 6.1-> 6.17 -> 6.31->6.3 -> 6.89 BUN 143 => 154 => 158=>162 => 167 Na 123  UOP 325 cc with Lasix gtt at 30 mg/hr.    More confused.  Not eating much.  C/o nausea, abdominal discomfort.   Objective:   Weight Range: 90.2 kg Body mass index is 26.97 kg/m.   Vital Signs:   Temp:  [97.6 F (36.4 C)-97.7 F (36.5 C)] 97.7 F (36.5 C) (08/17 0000) Pulse Rate:  [81-192] 94 (08/17 0800) Resp:  [0-27] 16 (08/17 0800) BP: (81-104)/(63-88) 82/63 (08/17 0800) SpO2:  [76 %-100 %] 91 % (08/17 0800) Weight:  [90.2 kg] 90.2 kg (08/17 0500) Last BM Date : 12/12/21  Weight change: Filed Weights   12/11/21 0600 12/12/21 0500 12/14/21 0500  Weight: 88 kg 88.6 kg 90.2 kg    Intake/Output:   Intake/Output Summary (Last 24 hours) at 12/14/2021 0909 Last data filed at 12/14/2021 0700 Gross per 24 hour  Intake 1209.88 ml  Output 326 ml  Net 883.88 ml      Physical Exam  CVP 18-20  General: NAD Neck: JVP 16+ cm, no thyromegaly or thyroid nodule.  Lungs: Decreased at bases. CV: Lateral PMI.  Heart regular S1/S2, +S3, no murmur.  1+ edema to knees.  Abdomen: Soft, nontender, no hepatosplenomegaly, mild distention.  Skin: Intact without lesions or rashes.  Neurologic: Alert, confused.  Expressive aphasia.  Psych: Normal affect. Extremities: No clubbing or cyanosis.  HEENT: Normal.    Telemetry   BiV  90s (personally reviewed)  Labs    CBC Recent Labs    12/13/21 0513 12/14/21 0454  WBC 11.1* 11.4*  HGB 13.0 12.8*  HCT 38.5* 37.0*  MCV 85.6 84.3  PLT 186 162   Basic Metabolic Panel Recent Labs    51/76/16 0513 12/14/21 0454  NA 124* 123*  K 3.3* 3.6  CL 76* 73*  CO2 30 32  GLUCOSE 128* 138*  BUN 162* 167*  CREATININE 6.32* 6.89*  CALCIUM 8.7* 8.6*  PHOS 8.6* 9.3*   Liver Function Tests Recent Labs    12/13/21 0513 12/14/21 0454  ALBUMIN 3.3* 3.2*    No results for input(s): "LIPASE", "AMYLASE" in the last 72 hours. Cardiac Enzymes No results for input(s): "CKTOTAL", "CKMB", "CKMBINDEX", "TROPONINI" in the last 72 hours.  BNP: BNP (last 3 results) No results for input(s): "BNP" in the last 8760 hours.  ProBNP (last 3 results) No results for input(s): "PROBNP" in the last 8760 hours.   D-Dimer No results for input(s): "DDIMER" in the last 72 hours. Hemoglobin A1C No results for input(s): "HGBA1C" in the last 72 hours.  Fasting Lipid Panel No results for input(s): "CHOL", "HDL", "LDLCALC", "TRIG", "CHOLHDL", "LDLDIRECT" in the last 72 hours.  Thyroid Function Tests No results for input(s): "TSH", "T4TOTAL", "T3FREE", "THYROIDAB" in the last 72  hours.  Invalid input(s): "FREET3"  Other results:   Imaging    No results found.   Medications:     Scheduled Medications:  apixaban  5 mg Oral BID   Chlorhexidine Gluconate Cloth  6 each Topical Daily   feeding supplement  237 mL Oral TID BM   HYDROmorphone  2 mg Intravenous Once   insulin aspart  0-5 Units Subcutaneous QHS   insulin aspart  0-9 Units Subcutaneous TID WC   midodrine  10 mg Oral Q8H    Infusions:  sodium chloride     sodium chloride Stopped (12/04/21 1213)   sodium chloride     sodium chloride     DOBUTamine 7.5 mcg/kg/min (12/14/21 0700)   furosemide (LASIX) 200 mg in dextrose 5 % 100 mL (2 mg/mL) infusion 30 mg/hr (12/14/21 0700)   HYDROmorphone Stopped (12/09/21  1041)   midazolam Stopped (12/09/21 1041)   norepinephrine (LEVOPHED) Adult infusion 8 mcg/min (12/14/21 0700)    PRN Medications: Place/Maintain arterial line **AND** sodium chloride, Place/Maintain arterial line **AND** sodium chloride, acetaminophen **OR** acetaminophen (TYLENOL) oral liquid 160 mg/5 mL **OR** acetaminophen, alum & mag hydroxide-simeth, mouth rinse, oxyCODONE    Assessment/Plan   1. CVA: Acute left MCA CVA, likely cardioembolic.  Has dilated cardiomyopathy, also chronic atrial fibrillation. Echo did not show LV thrombus.  He is now s/p thrombectomy with residual small SAH. Per family, he probably was not taking his Eliquis at home.  C/w expressive aphasia, also aspiration risk (on full liquids currently).  - On Eliquis. Statin stopped with transition to more comfort approach and dysphagia 2. Atrial fibrillation: Chronic, s/p AV nodal ablation.  Had successful DCCV in 6/23 but now back in AF. Rate controlled  - Eliquis 5 mg bid   3. AKI on CKD stage 3: Baseline creatinine 1.8-2 range, suspect cardiorenal syndrome with superimposed ATN from hypotension earlier in hospitalization and possible CIN contribution. BUN/creatinine continue to rise, 167/6.89 this morning. Now with uremic symptoms (confusion).     - Not HD candidate with end-stage CHF.  4. 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  Last cath in 12/21 with normal coronaries.  Echo this admission with EF 20%, severe LV dilation, severe biatrial enlargement, moderate RV enlargement with mildly decreased systolic function, mod-severe MR, moderate TR, dilated IVC.  Has St Jude CRT-D device.  He has known low output/end stage HF, was started on milrinone gtt 0.2 at [facility] in 12/22. Refused LVAD.  Now on DBA 7.5 with NE 8 + midodrine. CO-OX 48.5%. CVP 18-20.  Not responding much to Lasix (only 325  cc).  - Continue Lasix gtt 30 mg/hr, will not give metolazone today with  hyponatremia.  - Continue current dobutamine and NE without escalation.  - Not candidate for CVVHD - He has reached end-stage with MSOF.  5. Mitral regurgitation: Moderate-severe functional MR.    6. PVCs/NSVT: Continue amiodarone po.  7. Hyponatremia: Hypervolemic hyponatremia. Received Tolvaptan 08/07, 08/08, 08/09, 8/11. Na 123 today.  Tolvaptan not having effect with worsening renal dysfunction.  8. DNR/DNI: He has reached end-stage with MSOF.  Plan has been to continue current regimen (dobutamine/NE/Lasix) for now but as symptom burden increases will switch to comfort gtts and stop inotropes. Family does not want him to suffer and do not want further excalation of care but have not been ready for full comfort management.  ICD tachy therapies are off.   At this point, he  is getting more confused and nausea/abdominal pain increasing, think time to discuss comfort care again.  Palliative care service to speak with sister later this morning.  I spoke with significant other this morning.                                                                                                  Marca Ancona 12/14/2021 9:09 AM

## 2021-12-14 NOTE — Progress Notes (Addendum)
   Called by nursing staff to speak with family.    Arrived at bedside and spoke with his sister, brother, and son.   Mr Eliane Decree has had intermittent nausea and fatigue. Less interactive.   Discussed his poor prognosis and worsening MSOF.  The family said they would like to switch to comfort. I explained that we would start comfort medications and stop lasix drip, norepi, and dobutamine drip.   Family in agreement and appreciated the care. Starting dilaudid and giving fentanyl 25 mcg x 1.   ICD deactivated. [employer] called and at the bedside    Tonye Becket NP-C  5:55 PM   Discussed with Dr Shirlee Latch.   Jeannie Mallinger NP-C  6:18 PM

## 2021-12-15 DIAGNOSIS — I63412 Cerebral infarction due to embolism of left middle cerebral artery: Secondary | ICD-10-CM | POA: Diagnosis not present

## 2021-12-15 DIAGNOSIS — R0600 Dyspnea, unspecified: Secondary | ICD-10-CM

## 2021-12-15 DIAGNOSIS — Z515 Encounter for palliative care: Secondary | ICD-10-CM | POA: Diagnosis not present

## 2021-12-15 LAB — GLUCOSE, CAPILLARY
Glucose-Capillary: 105 mg/dL — ABNORMAL HIGH (ref 70–99)
Glucose-Capillary: 84 mg/dL (ref 70–99)
Glucose-Capillary: 34 mg/dL — CL (ref 70–99)

## 2021-12-15 MED ORDER — ONDANSETRON HCL 4 MG/2ML IJ SOLN
4.0000 mg | Freq: Four times a day (QID) | INTRAMUSCULAR | Status: DC
  Administered 2021-12-15 (×2): 4 mg via INTRAVENOUS
  Filled 2021-12-15 (×2): qty 2

## 2021-12-15 MED ORDER — HYDROMORPHONE BOLUS VIA INFUSION
2.0000 mg | INTRAVENOUS | Status: DC | PRN
Start: 2021-12-15 — End: 2021-12-15

## 2021-12-15 MED ORDER — LORAZEPAM 2 MG/ML IJ SOLN
2.0000 mg | Freq: Four times a day (QID) | INTRAMUSCULAR | Status: DC
Start: 2021-12-15 — End: 2021-12-16

## 2021-12-15 MED ORDER — LORAZEPAM 2 MG/ML IJ SOLN
INTRAMUSCULAR | Status: AC
  Filled 2021-12-15: qty 1

## 2021-12-15 MED ORDER — GLYCOPYRROLATE 0.2 MG/ML IJ SOLN
0.2000 mg | INTRAMUSCULAR | Status: DC
  Administered 2021-12-15 (×2): 0.2 mg via INTRAVENOUS
  Filled 2021-12-15 (×2): qty 1

## 2021-12-15 MED ORDER — HALOPERIDOL 0.5 MG PO TABS: 0.5000 mg | ORAL_TABLET | ORAL | Status: DC | PRN

## 2021-12-15 MED ORDER — SODIUM CHLORIDE 0.9 % IV SOLN
4.0000 mg/h | INTRAVENOUS | Status: DC
  Administered 2021-12-15: 4 mg/h via INTRAVENOUS
  Filled 2021-12-15: qty 5

## 2021-12-15 MED ORDER — LORAZEPAM 2 MG/ML IJ SOLN
2.0000 mg | INTRAMUSCULAR | Status: AC
Start: 2021-12-15 — End: 2021-12-15
  Administered 2021-12-15: 2 mg via INTRAVENOUS

## 2021-12-15 MED ORDER — HALOPERIDOL LACTATE 5 MG/ML IJ SOLN: 0.5000 mg | INTRAMUSCULAR | Status: DC | PRN

## 2021-12-15 MED ORDER — LORAZEPAM 2 MG/ML IJ SOLN
2.0000 mg | INTRAMUSCULAR | Status: DC
  Filled 2021-12-15 (×2): qty 1

## 2021-12-15 MED ORDER — HALOPERIDOL LACTATE 2 MG/ML PO CONC: 0.5000 mg | ORAL | Status: DC | PRN

## 2021-12-15 MED ORDER — HYDROMORPHONE BOLUS VIA INFUSION
4.0000 mg | INTRAVENOUS | Status: DC | PRN
  Administered 2021-12-15: 4 mg via INTRAVENOUS

## 2021-12-15 MED ORDER — HYDROMORPHONE BOLUS VIA INFUSION
2.0000 mg | Freq: Once | INTRAVENOUS | Status: AC
  Administered 2021-12-15: 2 mg via INTRAVENOUS

## 2021-12-29 NOTE — Progress Notes (Signed)
Patient ID: Steve Salazar, male   DOB: 19-Feb-1951, 71 y.o.   MRN: [id number]     Advanced Heart Failure Rounding Note  PCP-Cardiologist: [name], MD   Subjective:    7/31: Milrinone d/c 2/2 hypotension. Transitioned to DBA 5 + Lasix gtt at 15/hr. Midodrine added.  8/1: Co-ox 48%, DBA increased to 7.5. Metolazone added to augment diuresis  8/2: Lasix gtt increased to 20/hr + 2.5 metolazone  8/3: NE weaned off. Lasix gtt off. Started PO torsemide.  8/5: NE restarted 8/7: Started on lasix drip at 30. Given tolvaptan.  8/17: Full comfort care  Now off vasoactive gtts and no labs.  Full comfort care.  SBP 70s.  Patient appears comfortable.   Objective:   Weight Range: 90.2 kg Body mass index is 26.97 kg/m.   Vital Signs:   Temp:  [97.7 F (36.5 C)-98.6 F (37 C)] 98.6 F (37 C) (08/18 0901) Pulse Rate:  [63-201] 63 (08/18 0901) Resp:  [4-20] 7 (08/18 1137) BP: (73-91)/(53-75) 76/53 (08/18 0901) SpO2:  [59 %-97 %] 82 % (08/18 0901) Last BM Date : 11/27/21  Weight change: Filed Weights   12/11/21 0600 12/12/21 0500 12/14/21 0500  Weight: 88 kg 88.6 kg 90.2 kg    Intake/Output:   Intake/Output Summary (Last 24 hours) at 12/20/2021 1217 Last data filed at 12/14/2021 1600 Gross per 24 hour  Intake 220.96 ml  Output --  Net 220.96 ml      Physical Exam   General: NAD, sleeping Neck: JVP 16 cm, no thyromegaly or thyroid nodule.  Lungs: Clear to auscultation bilaterally with normal respiratory effort. CV: Lateral PMI.  Heart regular S1/S2, no S3/S4, no murmur.  2+ edema to knees. .   Abdomen: Soft, nontender, no hepatosplenomegaly, no distention.  Skin: Intact without lesions or rashes.  Neurologic: Alert and oriented x 3.  Psych: Normal affect. Extremities: No clubbing or cyanosis.  HEENT: Normal.     Labs    CBC Recent Labs    12/13/21 0513 12/14/21 0454  WBC 11.1* 11.4*  HGB 13.0 12.8*  HCT 38.5* 37.0*  MCV 85.6 84.3  PLT 186 162   Basic  Metabolic Panel Recent Labs    68/34/19 0513 12/14/21 0454  NA 124* 123*  K 3.3* 3.6  CL 76* 73*  CO2 30 32  GLUCOSE 128* 138*  BUN 162* 167*  CREATININE 6.32* 6.89*  CALCIUM 8.7* 8.6*  PHOS 8.6* 9.3*   Liver Function Tests Recent Labs    12/13/21 0513 12/14/21 0454  ALBUMIN 3.3* 3.2*    No results for input(s): "LIPASE", "AMYLASE" in the last 72 hours. Cardiac Enzymes No results for input(s): "CKTOTAL", "CKMB", "CKMBINDEX", "TROPONINI" in the last 72 hours.  BNP: BNP (last 3 results) No results for input(s): "BNP" in the last 8760 hours.  ProBNP (last 3 results) No results for input(s): "PROBNP" in the last 8760 hours.   D-Dimer No results for input(s): "DDIMER" in the last 72 hours. Hemoglobin A1C No results for input(s): "HGBA1C" in the last 72 hours.  Fasting Lipid Panel No results for input(s): "CHOL", "HDL", "LDLCALC", "TRIG", "CHOLHDL", "LDLDIRECT" in the last 72 hours.  Thyroid Function Tests No results for input(s): "TSH", "T4TOTAL", "T3FREE", "THYROIDAB" in the last 72 hours.  Invalid input(s): "FREET3"  Other results:   Imaging    No results found.   Medications:     Scheduled Medications:  glycopyrrolate  0.2 mg Intravenous Q4H   LORazepam  2 mg Intravenous Q4H  ondansetron (ZOFRAN) IV  4 mg Intravenous Q6H    Infusions:  sodium chloride     HYDROmorphone 4 mg/hr (11/28/2021 1023)    PRN Medications: haloperidol **OR** haloperidol **OR** haloperidol lactate, HYDROmorphone, mouth rinse, polyethylene glycol    Assessment/Plan   1. CVA: Acute left MCA CVA, likely cardioembolic.  Has dilated cardiomyopathy, also chronic atrial fibrillation. Echo did not show LV thrombus.  He is now s/p thrombectomy with residual small SAH. Per family, he probably was not taking his Eliquis at home.  C/w expressive aphasia, also aspiration risk (on full liquids currently).  - Off Eliquis with comfort care.  2. Atrial fibrillation: Chronic, s/p AV  nodal ablation.  Had successful DCCV in 6/23 but now back in AF. Rate controlled  - Off Eliquis with comfort care.    3. AKI on CKD stage 3: Baseline creatinine 1.8-2 range, suspect cardiorenal syndrome with superimposed ATN from hypotension earlier in hospitalization and possible CIN contribution. BUN/creatinine have continued to rise. Now with uremic symptoms (confusion).     - Not HD candidate with end-stage CHF.  4. 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  Last cath in 12/21 with normal coronaries.  Echo this admission with EF 20%, severe LV dilation, severe biatrial enlargement, moderate RV enlargement with mildly decreased systolic function, mod-severe MR, moderate TR, dilated IVC.  Has St Jude CRT-D device.  He has known low output/end stage HF, was started on milrinone gtt 0.2 at [facility] in 12/22. Refused LVAD.  Not candidate for CVVHD - He has reached end-stage with MSOF, vasoactive meds stopped.  5. Mitral regurgitation: Moderate-severe functional MR.    6. PVCs/NSVT: amiodarone stopped with comfort care.   7. Hyponatremia: Hypervolemic hyponatremia. Received Tolvaptan 08/07, 08/08, 08/09, 8/11. Tolvaptan not having effect with worsening renal dysfunction.  8. DNR/DNI: He has reached end-stage with MSOF.  Comfort care, palliative care service following.  Anticipate in-hospital death.                                                                                                  Marca Ancona 12/09/2021 12:17 PM

## 2021-12-29 NOTE — Death Summary Note (Signed)
Advanced Heart Failure Death Summary  Death Summary   Patient ID: Steve Salazar MRN: [id number], DOB/AGE: 71-Feb-1952 71 y.o. 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 with long history of nonischemic cardiomyopathy and chronic atrial fibrillation w/ poor med compliance, admitted with acute left MCA CVA (likely cardioembolic).  Hospitalization c/b a/c end stage biventricular heart failure and progressive renal failure/cardiorenal syndrome, refractory to dual inotropic support.    Patient's cardiomyopathy appears to have been discovered around 2011.  Cath in 12/21 showed normal coronaries. He has a history of VT with ICD shocks, has [employer] CRT-D device.  He had AV nodal ablation in 12/22.  He has chronic atrial fibrillation, had DCCV in 6/23 that was initially successful but was back in AF this admission.  He had not been on amiodarone at home.  Patient was being followed by the HF service at [facility] and had been on milrinone 0.2 at home since 12/22 admission due to low output HF.  He refused LVAD.    Patient was admitted to [facility] on 2021-12-10 with left MCA CVA, w/ resultant expressive aphasia and right-sided weakness.  He was taken for thrombectomy and had small SAH after the procedure.  His family reported he had not been compliant w/ Eliquis at home. F/u head CT on 7/29 showed regressed small volume of left sylvian fissure SAH since prior exam. Roughly 4 cm posterior left MCA territory infarct now visible. No malignant hemorrhagic transformation or mass  effect. Eliquis restarted.    He developed a/c systolic heart failure w/ low output and volume overload, Co-ox 40%. Echo with EF 20%, severe LV dilation, severe biatrial enlargement, moderate RV enlargement with mildly decreased systolic function, mod-severe MR, moderate TR, dilated IVC. AHF team consulted. Milrinone titrated to 0.375 and required addition of NE to maintain MAP. Due to persistent hypotension, milrinone was discontinued and switched to DBA. Attempted diuresis w/ Lasix gtt, but c/w refractory volume overload and progressive oliguric renal failure w/ Scr rising >6.0. Not a candidate for HD and not candidate for LVAD given acute CVA and apparent lack of compliance with apixaban (concerned about his compliance with warfarin if an LVAD were implanted).   Given refractory shock, unresponsive to maximum inotropic support and no mechanical support options, decision was made to move to comfort care. Made DNR/DNI. Inotropes/ pressors discontinued. ICD deactivated. Palliative care team assisted w/ comfort gtts. Patient passed 12/28/2021.     Significant Diagnostic Studies  Brain MRI 7/28 IMPRESSION: 1. Acute left MCA posterior division territory infarct. No mass effect. 2. Trace subarachnoid hemorrhage in the left sylvian fissure.   Head CT 7/29 IMPRESSION: 1. Regressed small volume of left sylvian fissure SAH since yesterday. 2. Roughly 4 cm posterior left MCA territory infarct now visible (series 3, image 18). No malignant hemorrhagic transformation or mass effect.   2D Echo 7/28  1. Left ventricular ejection fraction, by estimation, is 20%. The left  ventricle has severely decreased function. The left ventricle demonstrates  global hypokinesis. The left ventricular internal cavity size was severely  dilated.  Left ventricular  diastolic function could not be evaluated.   2. Right ventricular systolic function is mildly reduced. The right  ventricular size is moderately enlarged.  There is moderately elevated  pulmonary artery systolic pressure.   3. Left atrial size was severely dilated.   4. Right atrial size was severely dilated.   5. Functional MR. The mitral valve is normal in structure. Moderate to  severe mitral valve regurgitation.   6. Tricuspid valve regurgitation is moderate.   7. The aortic valve is tricuspid. Aortic valve regurgitation is mild.   8. Aneurysm of the ascending aorta, measuring 42 mm.   9. The inferior vena cava is dilated in size with <50% respiratory  variability, suggesting right atrial pressure of 15 mmHg.    Consultations  Neurology  Cardiology  Advanced Heart Failure Pulmonary/ Critical Care Medicine  Nephrology  Palliative Care  Duration of Discharge Encounter: Greater than 35 minutes   Signed, Robbie Lis, PA-C 12/18/2021, 8:14 AM

## 2021-12-29 NOTE — Progress Notes (Signed)
PT Cancellation Note  Patient Details Name: Steve Salazar MRN: [id number] DOB: [birth year]   Cancelled Treatment:    Reason Eval/Treat Not Completed: Medical issues which prohibited therapy (Pt comfort care per chart.  Will sign off.)   Bevelyn Buckles 12/22/2021, 8:10 AM Masoud Nyce M,PT Acute Rehab Services [phone]

## 2021-12-29 NOTE — Progress Notes (Signed)
Daily Progress Note   Patient Name: Steve Salazar       Date: 12/01/2021 DOB: 1951/04/17  Age: 71 y.o. MRN#: [id number] Attending Physician: Laurey Morale, MD Primary Care Physician: [name], MD Admit Date: 11/11/2021  Reason for Consultation/Follow-up: Establishing goals of care and Terminal Care  Patient Profile/HPI: 70 y.o. 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 He was admitted on 11/03/2021 with acute left MCA stroke status post thrombectomy, post procedure small subarachnoid hemorrhage, acute on chronic CHF with cardiogenic shock on vasopressors, AKI on CKD, metabolic encephalopathy, shock liver, and others. Patient transitioned to full comfort only by attending team yesterday evening.   Subjective: Called to bedside by Palliative RN Rolly Salter Romines. Patient with significant discomfort. Moaning at times. Terminal secretions. Brow furrowing. Actively dying.  Confirmed with family GOC are full comfort measures only. Adjusted IV medications.  Remained at bedside until comfort was achieved.  Per chart review he was also nauseated yesterday evening.   Review of Systems  Unable to perform ROS: Mental status change     Physical Exam Vitals and nursing note reviewed.  Constitutional:      Appearance: He is ill-appearing.  Pulmonary:     Comments: Cheyne-stokes with tachypnea  Neurological:     Comments: unresponsive             Vital Signs:  BP (!) 76/53 (BP Location: Left Arm)   Pulse 63   Temp 98.6 F (37 C)   Resp 15   Ht 6' (1.829 m)   Wt 90.2 kg   SpO2 (!) 82%   BMI 26.97 kg/m  SpO2: SpO2: (!) 82 % O2 Device: O2 Device: Room Air O2 Flow Rate: O2 Flow Rate (L/min): 2 L/min  Intake/output summary:  Intake/Output Summary (Last 24 hours) at 12/06/2021 0945 Last data filed at 12/14/2021 1600 Gross per 24 hour  Intake 441.08 ml  Output --  Net 441.08 ml   LBM: Last BM Date : 11/27/21 Baseline Weight: Weight: 86 kg Most recent weight: Weight: 90.2 kg  Palliative Assessment/Data: PPS: 10%      Patient Active Problem List   Diagnosis Date Noted   Cardiogenic shock (HCC)    Stroke (cerebrum) (HCC) 10/28/2021   Atrial flutter (HCC)    GERD (gastroesophageal reflux disease) 04/15/2021   Syncope 04/15/2021   VT (ventricular tachycardia) (HCC) 04/15/2021   Biventricular ICD (implantable cardioverter-defibrillator) in place 08/23/2020   CHF (congestive heart failure) (HCC) 03/02/2020   Atrial tachycardia (HCC) 09/02/2017   CKD (chronic kidney disease) 07/10/2017   Elevated PSA 07/10/2017   Primary osteoarthritis of left shoulder 07/12/2016   BPH (benign prostatic hyperplasia) 01/13/2016   Family history of diabetes mellitus 09/29/2015   Right inguinal hernia 09/29/2015   Erectile dysfunction 06/01/2015   Chronic systolic CHF (congestive heart failure) (HCC) 08/12/2014   Essential hypertension 01/26/2013   Bradycardia 12/07/2011   Type 2 MI (myocardial infarction) (HCC) 07/22/2009   SVT/ PSVT/ PAT 07/22/2009   DYSPNEA 07/22/2009    Palliative Care Assessment & Plan    Assessment/Recommendations/Plan  Full comfort measures only D/C fentanyl Increase hydromorphone to 4mg /hr with 4mg  IV bolus doses every 15 min Robinul .2mg  IV q4hr Start ondansetron 4mg  IV q6 hr for nausea he possibly has and cannot express as he was significantly nauseated last evening   Code Status: DNR  Prognosis:  Hours  - Days  Discharge Planning: Anticipated Hospital Death  Care plan was discussed with patient's family and nurse.   Thank you for allowing the Palliative Medicine Team to assist in the care of this patient.   Prolonged billing:  60 min     Greater than 50%  of this time was spent counseling and coordinating care related to the above assessment and plan.  , AGNP-C Palliative Medicine   Please contact Palliative Medicine Team phone at [phone] for questions and concerns.

## 2021-12-29 NOTE — Progress Notes (Signed)
Physical Therapy Discharge Patient Details Name: RITA PROM MRN: [id number] DOB: 24-Feb-1951 Today's Date: 12/18/2021 Time:  -     Patient discharged from PT services secondary to medical decline - will need to re-order PT to resume therapy services.  Please see latest therapy progress note for current level of functioning and progress toward goals.    Progress and discharge plan discussed with patient and/or caregiver: Patient unable to participate in discharge planning and no caregivers available  GP     Bevelyn Buckles 12/19/2021, 8:10 AM  Samyak Sackmann M,PT Acute Rehab Services [phone]

## 2021-12-29 NOTE — Progress Notes (Signed)
Patient QI:[id number] Steve Salazar      DOB: Sep 11, 1950      VZD:[id number]      Palliative Medicine Team    Subjective: Bedside symptom check completed. Two family members present at bedside during visit.    Physical exam: Patient laying in bed with eyes closed at time of visit. Breathing with cheyne-stokes irregularity, occasionally labored with nasal cannula applied at 1.5L.Excessive secretions noted and questioned to this RN by family relating to suctioning/management for comfort. Patient overall uncomfortable appearing at this time. This RN immediately contacted PMT NP Ocie Bob for additional support after administering PRN medications available (see eMAR). This RN remained bedside to provide support to family in the meantime.    Assessment and plan: NP bedside to adjust comfort medications, administered per order (see eMAR). Bedside RN updated with plan and changes to medications. Once patient achieved more comfortable state, oxygen was turned off. NP and RN remained bedside with family, helping to explain assessment of patient and observable signs of expected symptoms at end of life. They are understanding of this and appreciative of the care offered. Bedside RN made aware of needing new infusion bag, to call pharmacy and look for tubing of replacement bag. Bedside RN without additional needs or concerns at this time. Encouraged RN and family to reach out to PMT with any changes or needs. Will continue to follow for any changes or advances.    Thank you for allowing the Palliative Medicine Team to assist in the care of this patient.     Steve Jakes, MSN, RN Palliative Medicine Team Team Phone: [phone]  This phone is monitored 7a-7p, please reach out to attending physician outside of these hours for urgent needs.

## 2021-12-29 DEATH — deceased

## 2022-01-09 ENCOUNTER — Ambulatory Visit: Payer: Medicare PPO | Admitting: Cardiovascular Disease

## 2022-04-28 IMAGING — CR DG CHEST 2V
1 series · 1 of 1 positions shown · non-contrast
Comparison: 11/05/2016

CLINICAL DATA: Deferred related placement.

EXAM:
CHEST - 2 VIEW

[w chest lat]
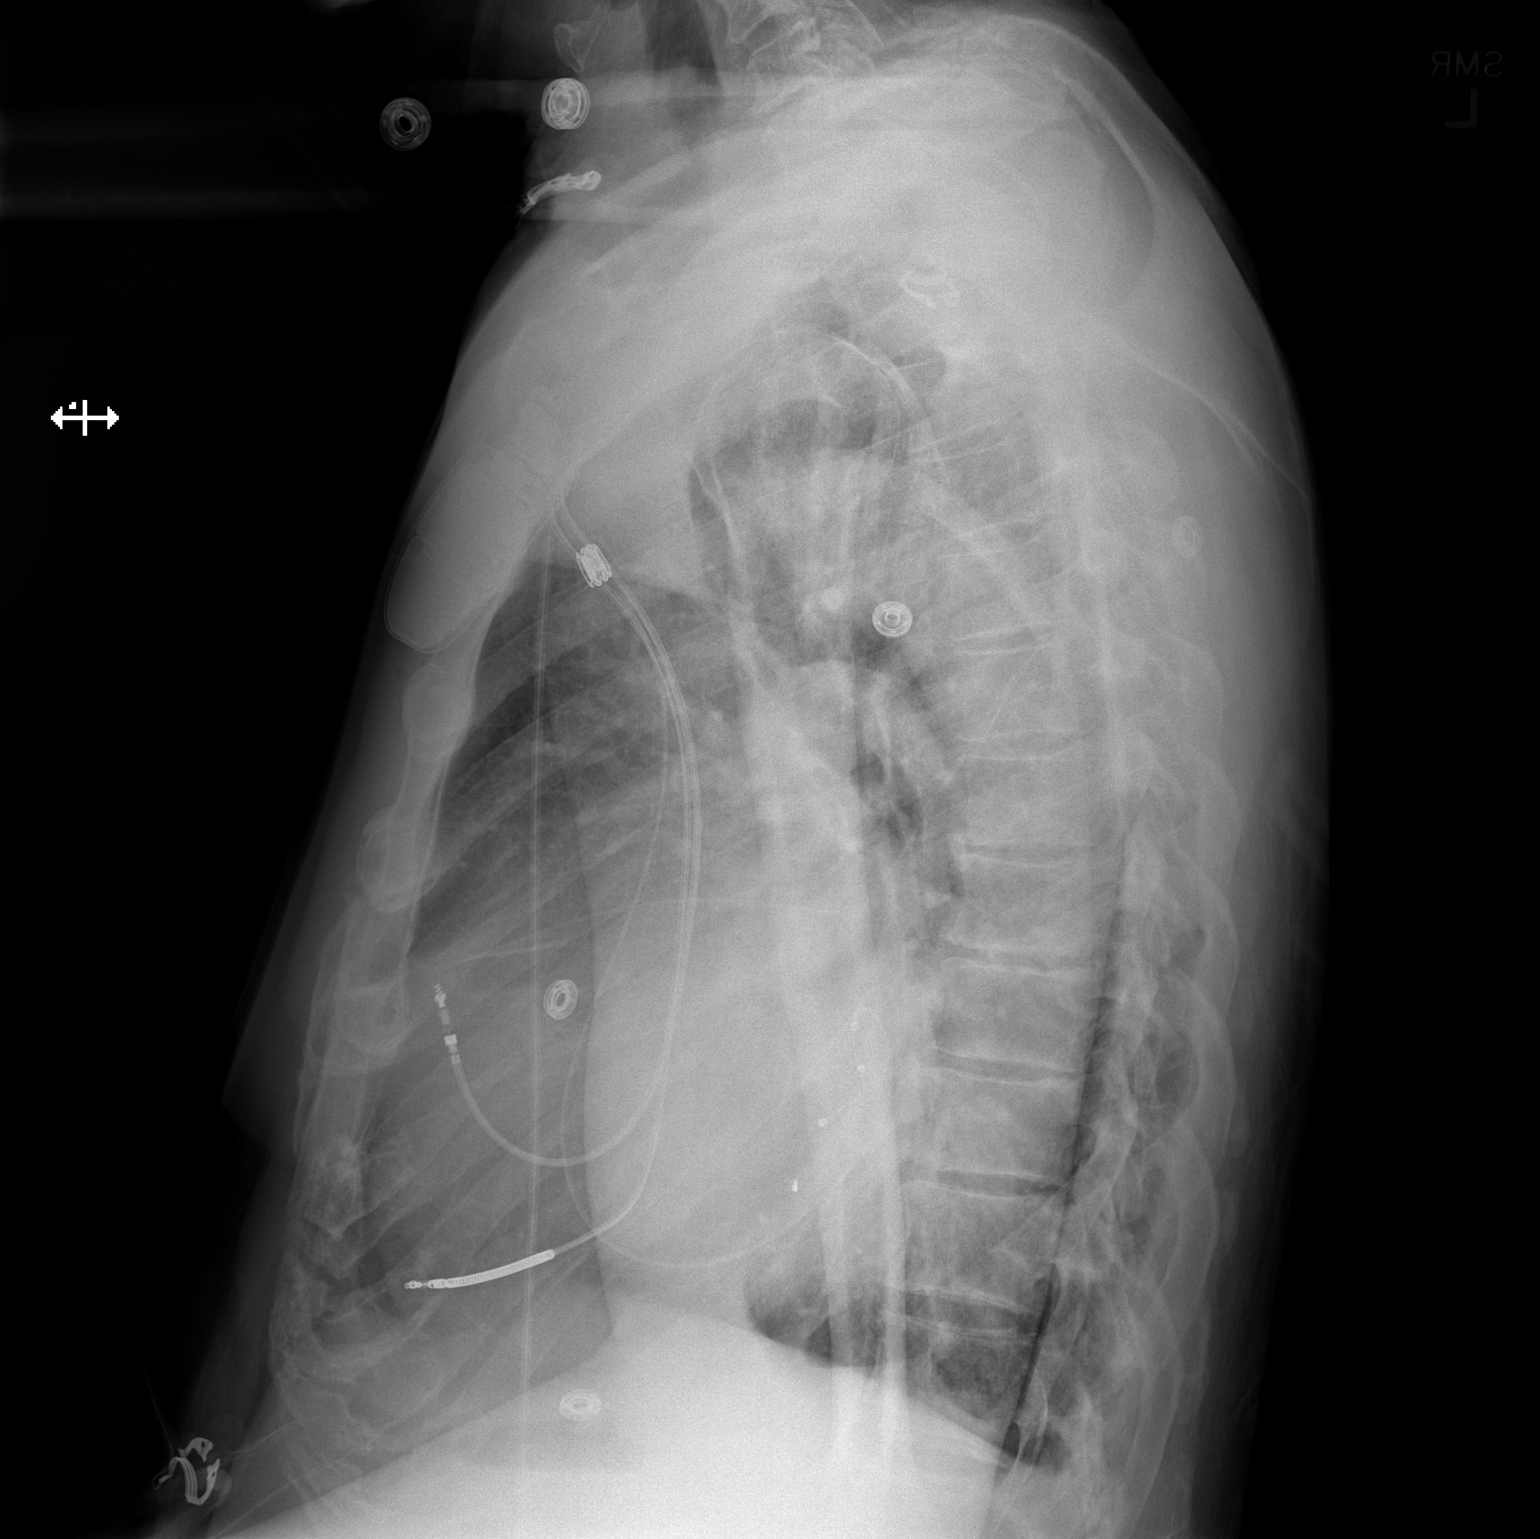

[1 of 1 positions shown; findings below may reference images not displayed]

FINDINGS: There has been interval placement of a left subclavian approach ICD
with leads projecting over the right atrium, right ventricle, and
coronary sinus. The cardiac silhouette remains mildly enlarged. The
thoracic aorta is mildly tortuous. No airspace consolidation, edema,
pleural effusion, pneumothorax is identified. No acute osseous
abnormality is seen.
IMPRESSION: Interval ICD placement. No evidence of pneumothorax or acute
airspace disease.
